# Patient Record
Sex: Female | Born: 1955
Health system: Southern US, Community
[De-identification: ages and names within clinical notes are randomized; demographics above are authoritative.]

## PROBLEM LIST (undated history)

## (undated) DIAGNOSIS — R000 Tachycardia, unspecified: Secondary | ICD-10-CM

## (undated) DIAGNOSIS — C480 Malignant neoplasm of retroperitoneum: Secondary | ICD-10-CM

## (undated) DIAGNOSIS — Z923 Personal history of irradiation: Secondary | ICD-10-CM

## (undated) DIAGNOSIS — Z9889 Other specified postprocedural states: Secondary | ICD-10-CM

## (undated) DIAGNOSIS — E785 Hyperlipidemia, unspecified: Secondary | ICD-10-CM

## (undated) DIAGNOSIS — C50919 Malignant neoplasm of unspecified site of unspecified female breast: Secondary | ICD-10-CM

## (undated) DIAGNOSIS — I10 Essential (primary) hypertension: Secondary | ICD-10-CM

## (undated) DIAGNOSIS — R112 Nausea with vomiting, unspecified: Secondary | ICD-10-CM

## (undated) DIAGNOSIS — T4145XA Adverse effect of unspecified anesthetic, initial encounter: Secondary | ICD-10-CM

## (undated) DIAGNOSIS — M109 Gout, unspecified: Secondary | ICD-10-CM

## (undated) DIAGNOSIS — T8859XA Other complications of anesthesia, initial encounter: Secondary | ICD-10-CM

## (undated) DIAGNOSIS — E119 Type 2 diabetes mellitus without complications: Secondary | ICD-10-CM

## (undated) HISTORY — DX: Malignant neoplasm of unspecified site of unspecified female breast: C50.919

## (undated) HISTORY — DX: Hyperlipidemia, unspecified: E78.5

## (undated) HISTORY — PX: TOE SURGERY: SHX1073

## (undated) HISTORY — DX: Type 2 diabetes mellitus without complications: E11.9

## (undated) HISTORY — DX: Essential (primary) hypertension: I10

## (undated) HISTORY — PX: BREAST EXCISIONAL BIOPSY: SUR124

## (undated) HISTORY — PX: BREAST SURGERY: SHX581

## (undated) HISTORY — PX: TONSILLECTOMY: SUR1361

## (undated) HISTORY — PX: CHOLECYSTECTOMY: SHX55

---
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
## 2008-06-29 HISTORY — PX: SHOULDER SURGERY: SHX246

## 2011-04-28 DIAGNOSIS — E7800 Pure hypercholesterolemia, unspecified: Secondary | ICD-10-CM | POA: Insufficient documentation

## 2011-12-08 DIAGNOSIS — M79674 Pain in right toe(s): Secondary | ICD-10-CM | POA: Insufficient documentation

## 2012-03-03 DIAGNOSIS — Z0000 Encounter for general adult medical examination without abnormal findings: Secondary | ICD-10-CM | POA: Insufficient documentation

## 2013-04-03 DIAGNOSIS — N750 Cyst of Bartholin's gland: Secondary | ICD-10-CM | POA: Insufficient documentation

## 2014-08-31 ENCOUNTER — Ambulatory Visit (INDEPENDENT_AMBULATORY_CARE_PROVIDER_SITE_OTHER): Payer: Medicare Other | Admitting: Physician Assistant

## 2014-08-31 ENCOUNTER — Encounter: Payer: Self-pay | Admitting: Physician Assistant

## 2014-08-31 VITALS — BP 99/57 | HR 81 | Ht 59.0 in | Wt 165.0 lb

## 2014-08-31 DIAGNOSIS — I10 Essential (primary) hypertension: Secondary | ICD-10-CM | POA: Diagnosis not present

## 2014-08-31 DIAGNOSIS — G4700 Insomnia, unspecified: Secondary | ICD-10-CM

## 2014-08-31 DIAGNOSIS — J069 Acute upper respiratory infection, unspecified: Secondary | ICD-10-CM

## 2014-08-31 MED ORDER — AZITHROMYCIN 250 MG PO TABS: ORAL_TABLET | ORAL | Status: DC

## 2014-08-31 MED ORDER — OLMESARTAN-AMLODIPINE-HCTZ 20-5-12.5 MG PO TABS: 1.0000 | ORAL_TABLET | Freq: Every day | ORAL | Status: DC

## 2014-08-31 MED ORDER — HYDROCODONE-HOMATROPINE 5-1.5 MG/5ML PO SYRP: 5.0000 mL | ORAL_SOLUTION | Freq: Every evening | ORAL | Status: DC | PRN

## 2014-08-31 NOTE — Progress Notes (Signed)
Subjective:    Patient ID: Bonnie Torres, female    DOB: [birth year], 59 y.o.   MRN: [id number]  HPI Patient is a 59 year old female who presents to the clinic to establish care.  .. Active Ambulatory Problems    Diagnosis Date Noted  . Insomnia 08/31/2014  . Essential hypertension, benign 08/31/2014   Resolved Ambulatory Problems    Diagnosis Date Noted  . No Resolved Ambulatory Problems   Past Medical History  Diagnosis Date  . Hypertension   . Hyperlipidemia    .[name] History   Social History  . Marital Status: Widowed    Spouse Name: N/A  . Number of Children: N/A  . Years of Education: N/A   Occupational History  . Not on file.   Social History Main Topics  . Smoking status: Never Smoker   . Smokeless tobacco: Not on file  . Alcohol Use: No  . Drug Use: No  . Sexual Activity: Not Currently   Other Topics Concern  . Not on file   Social History Narrative  . No narrative on file   .[name] Family History  Problem Relation Age of Onset  . Hypertension Mother   . Diabetes Maternal Aunt   . Cancer Paternal Aunt     stomach  . Heart attack Paternal Aunt    Patient just finished a study at [facility] that was closely monitoring her blood pressure. She is now out of the study. She denies any chest pain, palpitations, headaches or vision changes.  Patient has had a long struggle with insomnia. She has been on clonidine for many years. She does not necessarily feel like it works effectively. Patient has problems going and staying asleep. She reports about 4 hours of sleep on average a night.  Patient also has had 5 days of upper respiratory symptoms. She reports cough that is productive, headaches, sore throat and body aches. She denies any fever, chills, nausea or vomiting. She's been taking Robitussin over-the-counter with little relief. She does feel like her chest is a little tight and short of breath. She has no history of lung issues or diseases.   Review  of Systems  All other systems reviewed and are negative.      Objective:   Physical Exam  Constitutional: She is oriented to person, place, and time. She appears well-developed and well-nourished.  HENT:  Head: Normocephalic and atraumatic.  Right Ear: External ear normal.  Left Ear: External ear normal.  Nose: Nose normal.  Mouth/Throat: Oropharynx is clear and moist. No oropharyngeal exudate.  TM's clear bilaterally.  Nasal turbinates red and swollen bilaterally.   Eyes: Conjunctivae are normal. Right eye exhibits no discharge. Left eye exhibits no discharge.  Neck: Normal range of motion. Neck supple.  Cardiovascular: Normal rate, regular rhythm and normal heart sounds.   Pulmonary/Chest: Effort normal and breath sounds normal. She has no wheezes.  Hacking cough on exam today.   Lymphadenopathy:    She has no cervical adenopathy.  Neurological: She is alert and oriented to person, place, and time.  Skin: Skin is dry.  Psychiatric: She has a normal mood and affect. Her behavior is normal.          Assessment & Plan:  HTN- discuss with patient the blood pressure is low today. She is on a lot of blood pressure medication. She also is not a fan of the pill burden that includes. I do want to make some changes in her blood pressure medicine today.  We'll start with decreasing lisinopril and diruretic and combining pills with tribenzor once daily. Once starts tribenzor then can stop lisinopril, norvasc, spironactolone. Continue on atenolol. Will up date med list once we find out if can afford tribenzor. Go online for coupon.  Follow up in 4 weeks to recheck BP.   Insomnia- on clonidine. Patient has been on for a while. She does not feel like this necessarily works. Discussed other sleep options such as trazodone, ambien and belsomra. Declines any change in treatment today.   Acute upper respiratory infection- likley started as viral. Cough is productive I do think symptoms changing  and not improving. Hycodan at bedtime given for cough. zpak for infection. Follow up if not improving. Continue mucinex.   Needs CPE in 4 weeks and BP check.

## 2014-08-31 NOTE — Patient Instructions (Addendum)
Melatonin up to 10mg  about 1 hour before bed.   Upper Respiratory Infection, Adult An upper respiratory infection (URI) is also sometimes known as the common cold. The upper respiratory tract includes the nose, sinuses, throat, trachea, and bronchi. Bronchi are the airways leading to the lungs. Most people improve within 1 week, but symptoms can last up to 2 weeks. A residual cough may last even longer.  CAUSES Many different viruses can infect the tissues lining the upper respiratory tract. The tissues become irritated and inflamed and often become very moist. Mucus production is also common. A cold is contagious. You can easily spread the virus to others by oral contact. This includes kissing, sharing a glass, coughing, or sneezing. Touching your mouth or nose and then touching a surface, which is then touched by another person, can also spread the virus. SYMPTOMS  Symptoms typically develop 1 to 3 days after you come in contact with a cold virus. Symptoms vary from person to person. They may include:  Runny nose.  Sneezing.  Nasal congestion.  Sinus irritation.  Sore throat.  Loss of voice (laryngitis).  Cough.  Fatigue.  Muscle aches.  Loss of appetite.  Headache.  Low-grade fever. DIAGNOSIS  You might diagnose your own cold based on familiar symptoms, since most people get a cold 2 to 3 times a year. Your caregiver can confirm this based on your exam. Most importantly, your caregiver can check that your symptoms are not due to another disease such as strep throat, sinusitis, pneumonia, asthma, or epiglottitis. Blood tests, throat tests, and X-rays are not necessary to diagnose a common cold, but they may sometimes be helpful in excluding other more serious diseases. Your caregiver will decide if any further tests are required. RISKS AND COMPLICATIONS  You may be at risk for a more severe case of the common cold if you smoke cigarettes, have chronic heart disease (such as heart  failure) or lung disease (such as asthma), or if you have a weakened immune system. The very young and very old are also at risk for more serious infections. Bacterial sinusitis, middle ear infections, and bacterial pneumonia can complicate the common cold. The common cold can worsen asthma and chronic obstructive pulmonary disease (COPD). Sometimes, these complications can require emergency medical care and may be life-threatening. PREVENTION  The best way to protect against getting a cold is to practice good hygiene. Avoid oral or hand contact with people with cold symptoms. Wash your hands often if contact occurs. There is no clear evidence that vitamin C, vitamin E, echinacea, or exercise reduces the chance of developing a cold. However, it is always recommended to get plenty of rest and practice good nutrition. TREATMENT  Treatment is directed at relieving symptoms. There is no cure. Antibiotics are not effective, because the infection is caused by a virus, not by bacteria. Treatment may include:  Increased fluid intake. Sports drinks offer valuable electrolytes, sugars, and fluids.  Breathing heated mist or steam (vaporizer or shower).  Eating chicken soup or other clear broths, and maintaining good nutrition.  Getting plenty of rest.  Using gargles or lozenges for comfort.  Controlling fevers with ibuprofen or acetaminophen as directed by your caregiver.  Increasing usage of your inhaler if you have asthma. Zinc gel and zinc lozenges, taken in the first 24 hours of the common cold, can shorten the duration and lessen the severity of symptoms. Pain medicines may help with fever, muscle aches, and throat pain. A variety of non-prescription  medicines are available to treat congestion and runny nose. Your caregiver can make recommendations and may suggest nasal or lung inhalers for other symptoms.  HOME CARE INSTRUCTIONS   Only take over-the-counter or prescription medicines for pain,  discomfort, or fever as directed by your caregiver.  Use a warm mist humidifier or inhale steam from a shower to increase air moisture. This may keep secretions moist and make it easier to breathe.  Drink enough water and fluids to keep your urine clear or pale yellow.  Rest as needed.  Return to work when your temperature has returned to normal or as your caregiver advises. You may need to stay home longer to avoid infecting others. You can also use a face mask and careful hand washing to prevent spread of the virus. SEEK MEDICAL CARE IF:   After the first few days, you feel you are getting worse rather than better.  You need your caregiver's advice about medicines to control symptoms.  You develop chills, worsening shortness of breath, or brown or red sputum. These may be signs of pneumonia.  You develop yellow or brown nasal discharge or pain in the face, especially when you bend forward. These may be signs of sinusitis.  You develop a fever, swollen neck glands, pain with swallowing, or white areas in the back of your throat. These may be signs of strep throat. SEEK IMMEDIATE MEDICAL CARE IF:   You have a fever.  You develop severe or persistent headache, ear pain, sinus pain, or chest pain.  You develop wheezing, a prolonged cough, cough up blood, or have a change in your usual mucus (if you have chronic lung disease).  You develop sore muscles or a stiff neck. Document Released: 12/09/2000 Document Revised: 09/07/2011 Document Reviewed: 09/20/2013 [facility] Patient Information 2015 [place], [place]. This information is not intended to replace advice given to you by your health care provider. Make sure you discuss any questions you have with your health care provider.

## 2014-09-28 ENCOUNTER — Encounter: Payer: Self-pay | Admitting: Physician Assistant

## 2014-09-28 ENCOUNTER — Ambulatory Visit (INDEPENDENT_AMBULATORY_CARE_PROVIDER_SITE_OTHER): Payer: Medicare Other | Admitting: Physician Assistant

## 2014-09-28 VITALS — BP 132/76 | HR 89 | Ht 59.0 in | Wt 170.0 lb

## 2014-09-28 DIAGNOSIS — Z131 Encounter for screening for diabetes mellitus: Secondary | ICD-10-CM

## 2014-09-28 DIAGNOSIS — I10 Essential (primary) hypertension: Secondary | ICD-10-CM | POA: Diagnosis not present

## 2014-09-28 DIAGNOSIS — Z79899 Other long term (current) drug therapy: Secondary | ICD-10-CM

## 2014-09-28 DIAGNOSIS — E785 Hyperlipidemia, unspecified: Secondary | ICD-10-CM | POA: Diagnosis not present

## 2014-09-28 DIAGNOSIS — G4700 Insomnia, unspecified: Secondary | ICD-10-CM

## 2014-09-28 LAB — COMPLETE METABOLIC PANEL WITH GFR
ALT: 14 U/L (ref 0–35)
AST: 15 U/L (ref 0–37)
Albumin: 4.4 g/dL (ref 3.5–5.2)
Alkaline Phosphatase: 46 U/L (ref 39–117)
BUN: 10 mg/dL (ref 6–23)
CHLORIDE: 103 meq/L (ref 96–112)
CO2: 27 meq/L (ref 19–32)
CREATININE: 0.77 mg/dL (ref 0.50–1.10)
Calcium: 9.4 mg/dL (ref 8.4–10.5)
GFR, EST NON AFRICAN AMERICAN: 86 mL/min
GFR, Est African American: >89 mL/min
Glucose, Bld: 110 mg/dL — ABNORMAL HIGH (ref 70–99)
Potassium: 3.3 mEq/L — ABNORMAL LOW (ref 3.5–5.3)
Sodium: 139 mEq/L (ref 135–145)
Total Bilirubin: 0.6 mg/dL (ref 0.2–1.2)
Total Protein: 7.8 g/dL (ref 6.0–8.3)

## 2014-09-28 LAB — LIPID PANEL
Cholesterol: 161 mg/dL (ref 0–200)
HDL: 35 mg/dL — AB (ref >=46)
LDL Cholesterol: 108 mg/dL — ABNORMAL HIGH (ref 0–99)
Total CHOL/HDL Ratio: 4.6 Ratio
Triglycerides: 92 mg/dL (ref <150)
VLDL: 18 mg/dL (ref 0–40)

## 2014-09-28 MED ORDER — OLMESARTAN-AMLODIPINE-HCTZ 20-5-12.5 MG PO TABS: 1.0000 | ORAL_TABLET | Freq: Every day | ORAL | Status: DC

## 2014-09-28 MED ORDER — CLONIDINE HCL 0.2 MG PO TABS: 0.2000 mg | ORAL_TABLET | Freq: Every day | ORAL | Status: DC

## 2014-09-28 MED ORDER — ATENOLOL 100 MG PO TABS: 100.0000 mg | ORAL_TABLET | Freq: Every day | ORAL | Status: DC

## 2014-09-28 NOTE — Progress Notes (Signed)
   Subjective:    Patient ID: Bonnie Torres, female    DOB: 04/08/1956, 59 y.o.   MRN: [id number]  HPI Pt presents to the clinic with BP follow up on tribenzor and BP medication changes. Denies any CP, headaches or problems. She does feel like she has more energy and feels overall better. She has noticed that she has more palpitations and feels like heart is racing. She admits she has not been taking atenolol as well.   Insomnia- well controlled. Needs clonadine refilled.   Hyperlipidemia- needs refill. Not checked in a while.    Review of Systems  All other systems reviewed and are negative.      Objective:   Physical Exam  Constitutional: She is oriented to person, place, and time. She appears well-developed and well-nourished.  HENT:  Head: Normocephalic and atraumatic.  Cardiovascular: Normal rate, regular rhythm and normal heart sounds.   Pulmonary/Chest: Effort normal and breath sounds normal. She has no wheezes.  Neurological: She is alert and oriented to person, place, and time.  Skin: Skin is dry.  Psychiatric: She has a normal mood and affect. Her behavior is normal.          Assessment & Plan:  HTN- pt was supposed to start tribenzor and atenolol. She stopped everything except tribenzor. I would like her to add 1/2 100mg  tablet of atenolol. This could be the reason palpitations have increased. BP looks fine. Will get cmp today. Follow up in 6 months, tribenzor and atenolol refilled.   Insomnia- refilled clonadine for 6 months.   Hyperlipidemia- ordered lipid today will adjust medications accordingly.   Has CPE on 15th.

## 2014-10-01 ENCOUNTER — Other Ambulatory Visit: Payer: Self-pay | Admitting: *Deleted

## 2014-10-01 MED ORDER — PRAVASTATIN SODIUM 40 MG PO TABS: 40.0000 mg | ORAL_TABLET | Freq: Every day | ORAL | Status: DC

## 2014-10-12 ENCOUNTER — Other Ambulatory Visit (HOSPITAL_COMMUNITY)
Admission: RE | Admit: 2014-10-12 | Discharge: 2014-10-12 | Disposition: A | Discharge status: Home / Self Care | Payer: Medicare Other | Source: Ambulatory Visit | Attending: Physician Assistant | Admitting: Physician Assistant

## 2014-10-12 ENCOUNTER — Ambulatory Visit (INDEPENDENT_AMBULATORY_CARE_PROVIDER_SITE_OTHER): Payer: Medicare Other | Admitting: Physician Assistant

## 2014-10-12 ENCOUNTER — Encounter: Payer: Self-pay | Admitting: Physician Assistant

## 2014-10-12 VITALS — BP 129/79 | HR 81 | Wt 168.0 lb

## 2014-10-12 DIAGNOSIS — Z23 Encounter for immunization: Secondary | ICD-10-CM | POA: Diagnosis not present

## 2014-10-12 DIAGNOSIS — Z124 Encounter for screening for malignant neoplasm of cervix: Secondary | ICD-10-CM | POA: Diagnosis not present

## 2014-10-12 DIAGNOSIS — Z0000 Encounter for general adult medical examination without abnormal findings: Secondary | ICD-10-CM

## 2014-10-12 DIAGNOSIS — Z1151 Encounter for screening for human papillomavirus (HPV): Secondary | ICD-10-CM | POA: Diagnosis not present

## 2014-10-12 DIAGNOSIS — R7301 Impaired fasting glucose: Secondary | ICD-10-CM

## 2014-10-12 DIAGNOSIS — E876 Hypokalemia: Secondary | ICD-10-CM | POA: Diagnosis not present

## 2014-10-12 DIAGNOSIS — Z1211 Encounter for screening for malignant neoplasm of colon: Secondary | ICD-10-CM

## 2014-10-12 DIAGNOSIS — Z1231 Encounter for screening mammogram for malignant neoplasm of breast: Secondary | ICD-10-CM

## 2014-10-12 LAB — POCT GLYCOSYLATED HEMOGLOBIN (HGB A1C)

## 2014-10-12 NOTE — Patient Instructions (Addendum)
Mammogram and colonoscopy ordered.   Suggested eye exam.  Keeping You Healthy  Get These Tests  Blood Pressure- Have your blood pressure checked by your healthcare provider at least once a year.  Normal blood pressure is 120/80.  Weight- Have your body mass index (BMI) calculated to screen for obesity.  BMI is a measure of body fat based on height and weight.  You can calculate your own BMI at [employer].it  Cholesterol- Have your cholesterol checked every year.  Diabetes- Have your blood sugar checked every year if you have high blood pressure, high cholesterol, a family history of diabetes or if you are overweight.  Pap Smear- Have a pap smear every 1 to 3 years if you have been sexually active.  If you are older than 65 and recent pap smears have been normal you may not need additional pap smears.  In addition, if you have had a hysterectomy  For benign disease additional pap smears are not necessary.  Mammogram-Yearly mammograms are essential for early detection of breast cancer  Screening for Colon Cancer- Colonoscopy starting at age 53. Screening may begin sooner depending on your family history and other health conditions.  Follow up colonoscopy as directed by your Gastroenterologist.  Screening for Osteoporosis- Screening begins at age 7 with bone density scanning, sooner if you are at higher risk for developing Osteoporosis.  Get these medicines  Calcium with Vitamin D- Your body requires 1200-1500 mg of Calcium a day and [phone] IU of Vitamin D a day.  You can only absorb 500 mg of Calcium at a time therefore Calcium must be taken in 2 or 3 separate doses throughout the day.  Hormones- Hormone therapy has been associated with increased risk for certain cancers and heart disease.  Talk to your healthcare provider about if you need relief from menopausal symptoms.  Aspirin- Ask your healthcare provider about taking Aspirin to prevent Heart Disease and Stroke.  Get  these Immuniztions  Flu shot- Every fall  Pneumonia shot- Once after the age of 37; if you are younger ask your healthcare provider if you need a pneumonia shot.  Tetanus- Every ten years.  Zostavax- Once after the age of 60 to prevent shingles.  Take these steps  Don't smoke- Your healthcare provider can help you quit. For tips on how to quit, ask your healthcare provider or go to www.smokefree.gov or call 1-800 QUIT-NOW.  Be physically active- Exercise 5 days a week for a minimum of 30 minutes.  If you are not already physically active, start slow and gradually work up to 30 minutes of moderate physical activity.  Try walking, dancing, bike riding, swimming, etc.  Eat a healthy diet- Eat a variety of healthy foods such as fruits, vegetables, whole grains, low fat milk, low fat cheeses, yogurt, lean meats, chicken, fish, eggs, dried beans, tofu, etc.  For more information go to www.thenutritionsource.org  Dental visit- Brush and floss teeth twice daily; visit your dentist twice a year.  Eye exam- Visit your Optometrist or Ophthalmologist yearly.  Drink alcohol in moderation- Limit alcohol intake to one drink or less a day.  Never drink and drive.  Depression- Your emotional health is as important as your physical health.  If you're feeling down or losing interest in things you normally enjoy, please talk to your healthcare provider.  Seat Belts- can save your life; always wear one  Smoke/Carbon Monoxide detectors- These detectors need to be installed on the appropriate level of your home.  Replace  batteries at least once a year.  Violence- If anyone is threatening or hurting you, please tell your healthcare provider.  Living Will/ Health care power of attorney- Discuss with your healthcare provider and family.

## 2014-10-13 LAB — BASIC METABOLIC PANEL
BUN: 13 mg/dL (ref 6–23)
CALCIUM: 9.4 mg/dL (ref 8.4–10.5)
CO2: 26 mEq/L (ref 19–32)
Chloride: 102 mEq/L (ref 96–112)
Creat: 0.79 mg/dL (ref 0.50–1.10)
GLUCOSE: 99 mg/dL (ref 70–99)
Potassium: 3.7 mEq/L (ref 3.5–5.3)
Sodium: 138 mEq/L (ref 135–145)

## 2014-10-13 LAB — HIV ANTIBODY (ROUTINE TESTING W REFLEX): HIV 1&2 Ab, 4th Generation: NONREACTIVE

## 2014-10-13 NOTE — Progress Notes (Signed)
Subjective:    Bonnie Torres is a 59 y.o. female who presents for Medicare Annual/Subsequent preventive examination.  Preventive Screening-Counseling & Management  Tobacco History  Smoking status  . Never Smoker   Smokeless tobacco  . Not on file     Problems Prior to Visit 1.   Current Problems (verified) Patient Active Problem List   Diagnosis Date Noted  . Hyperlipidemia 09/28/2014  . Insomnia 08/31/2014  . Essential hypertension, benign 08/31/2014    Medications Prior to Visit Current Outpatient Prescriptions on File Prior to Visit  Medication Sig Dispense Refill  . atenolol (TENORMIN) 100 MG tablet Take 1 tablet (100 mg total) by mouth daily. (Patient taking differently: Take 100 mg by mouth daily. Pt takes 1/2 tablet daily.) 90 tablet 0  . cloNIDine (CATAPRES) 0.2 MG tablet Take 1 tablet (0.2 mg total) by mouth at bedtime. 90 tablet 1  . Olmesartan-Amlodipine-HCTZ 20-5-12.5 MG TABS Take 1 tablet by mouth daily. 90 tablet 1  . pravastatin (PRAVACHOL) 40 MG tablet Take 1 tablet (40 mg total) by mouth daily. 90 tablet 3   No current facility-administered medications on file prior to visit.    Current Medications (verified) Current Outpatient Prescriptions  Medication Sig Dispense Refill  . atenolol (TENORMIN) 100 MG tablet Take 1 tablet (100 mg total) by mouth daily. (Patient taking differently: Take 100 mg by mouth daily. Pt takes 1/2 tablet daily.) 90 tablet 0  . cloNIDine (CATAPRES) 0.2 MG tablet Take 1 tablet (0.2 mg total) by mouth at bedtime. 90 tablet 1  . Olmesartan-Amlodipine-HCTZ 20-5-12.5 MG TABS Take 1 tablet by mouth daily. 90 tablet 1  . pravastatin (PRAVACHOL) 40 MG tablet Take 1 tablet (40 mg total) by mouth daily. 90 tablet 3   No current facility-administered medications for this visit.     Allergies (verified) Review of patient's allergies indicates no known allergies.   PAST HISTORY  Family History Family History  Problem Relation Age of  Onset  . Hypertension Mother   . Diabetes Maternal Aunt   . Cancer Paternal Aunt     stomach  . Heart attack Paternal Aunt     Social History History  Substance Use Topics  . Smoking status: Never Smoker   . Smokeless tobacco: Not on file  . Alcohol Use: No     Are there smokers in your home (other than you)? No  Risk Factors Current exercise habits: The patient does not participate in regular exercise at present.  Dietary issues discussed: none   Cardiac risk factors: sedentary life.  Depression Screen (Note: if answer to either of the following is "Yes", a more complete depression screening is indicated)   Over the past two weeks, have you felt down, depressed or hopeless? No  Over the past two weeks, have you felt little interest or pleasure in doing things? No  Have you lost interest or pleasure in daily life? No  Do you often feel hopeless? No  Do you cry easily over simple problems? No  Activities of Daily Living In your present state of health, do you have any difficulty performing the following activities?:  Driving? No Managing money?  No Feeding yourself? No Getting from bed to chair? No{ Climbing a flight of stairs? No Preparing food and eating?: No Bathing or showering? No Getting dressed: No Getting to the toilet? No Using the toilet:No Moving around from place to place: No In the past year have you fallen or had a near fall?:No   Are you  sexually active?  No  Do you have more than one partner?  N/A  Hearing Difficulties: No Do you often ask people to speak up or repeat themselves? No Do you experience ringing or noises in your ears? No Do you have difficulty understanding soft or whispered voices? No   Do you feel that you have a problem with memory? No  Do you often misplace items? No  Do you feel safe at home?  Yes  Cognitive Testing  Alert? Yes  Normal Appearance?Yes  Oriented to person? Yes  Place? Yes   Time? Yes  Recall of three  objects?  Yes  Can perform simple calculations? Yes  Displays appropriate judgment?Yes  Can read the correct time from a watch face?Yes   Advanced Directives have been discussed with the patient? Yes  List the Names of Other Physician/Practitioners you currently use: 1.    Indicate any recent Medical Services you may have received from other than [facility] providers in the past year (date may be approximate).  Immunization History  Administered Date(s) Administered  . Tdap 10/12/2014    Screening Tests Health Maintenance  Topic Date Due  . MAMMOGRAM  08/04/2007  . COLONOSCOPY  08/04/2007  . INFLUENZA VACCINE  01/28/2015  . PAP SMEAR  10/11/2017  . TETANUS/TDAP  10/11/2024  . HIV Screening  Completed    All answers were reviewed with the patient and necessary referrals were made:  [facility], Bonnie Wiedeman, PA-C   10/13/2014   History reviewed: allergies, current medications, past family history, past medical history, past social history, past surgical history and problem list  Review of Systems A comprehensive review of systems was negative.    Objective:     Vision by Snellen chart: right eye:20/40, left eye:20/25  Body mass index is 33.91 kg/(m^2). BP 129/79 mmHg  Pulse 81  Wt 168 lb (76.204 kg)  SpO2 97%  BP 129/79 mmHg  Pulse 81  Wt 168 lb (76.204 kg)  SpO2 97%  General Appearance:    Alert, cooperative, no distress, appears stated age  Head:    Normocephalic, without obvious abnormality, atraumatic  Eyes:    PERRL, conjunctiva/corneas clear, EOM's intact, fundi    benign, both eyes  Ears:    Normal TM's and external ear canals, both ears  Nose:   Nares normal, septum midline, mucosa normal, no drainage    or sinus tenderness  Throat:   Lips, mucosa, and tongue normal; teeth and gums normal  Neck:   Supple, symmetrical, trachea midline, no adenopathy;    thyroid:  no enlargement/tenderness/nodules; no carotid   bruit or JVD  Back:     Symmetric, no curvature, ROM  normal, no CVA tenderness  Lungs:     Clear to auscultation bilaterally, respirations unlabored  Chest Wall:    No tenderness or deformity   Heart:    Regular rate and rhythm, S1 and S2 normal, no murmur, rub   or gallop  Breast Exam:    Not done. Ordered mammogram.   Abdomen:     Soft, non-tender, bowel sounds active all four quadrants,    no masses, no organomegaly  Genitalia:    Normal female without lesion, discharge or tenderness  Rectal:   not done.   Extremities:   Extremities normal, atraumatic, no cyanosis or edema  Pulses:   2+ and symmetric all extremities  Skin:   Skin color, texture, turgor normal, no rashes or lesions  Lymph nodes:   Cervical, supraclavicular, and axillary nodes normal  Neurologic:   CNII-XII intact, normal strength, sensation and reflexes    throughout       Assessment:          Plan:     During the course of the visit the patient was educated and counseled about appropriate screening and preventive services including:    Td vaccine  Screening mammography  Screening Pap smear and pelvic exam   Colorectal cancer screening  Tdap given today.  Mammogram and colonoscopy ordered.  Pap smear done today.    Discussed diet and regular exercise. Encouraged calcium 1200mg  and vitamin d 800 units daily.  HTN- refilled medications for 6 months.   Fasting labs ordered.    Patient Instructions (the written plan) was given to the patient.  Medicare Attestation I have personally reviewed: The patient's medical and social history Their use of alcohol, tobacco or illicit drugs Their current medications and supplements The patient's functional ability including ADLs,fall risks, home safety risks, cognitive, and hearing and visual impairment Diet and physical activities Evidence for depression or mood disorders  The patient's weight, height, BMI, and visual acuity have been recorded in the chart.  I have made referrals, counseling, and provided  education to the patient based on review of the above and I have provided the patient with a written personalized care plan for preventive services.     Iran Planas, PA-C   10/12/14

## 2014-10-15 LAB — CYTOLOGY - PAP

## 2014-11-16 ENCOUNTER — Encounter: Payer: Self-pay | Admitting: Internal Medicine

## 2014-11-22 ENCOUNTER — Ambulatory Visit: Payer: Medicare Other

## 2014-12-06 ENCOUNTER — Ambulatory Visit: Payer: Medicare Other

## 2015-03-06 DIAGNOSIS — H524 Presbyopia: Secondary | ICD-10-CM | POA: Diagnosis not present

## 2015-03-28 ENCOUNTER — Other Ambulatory Visit: Payer: Self-pay | Admitting: Physician Assistant

## 2015-04-01 ENCOUNTER — Other Ambulatory Visit: Payer: Self-pay | Admitting: Physician Assistant

## 2015-04-15 ENCOUNTER — Ambulatory Visit (INDEPENDENT_AMBULATORY_CARE_PROVIDER_SITE_OTHER): Payer: Medicare Other | Admitting: Physician Assistant

## 2015-04-15 VITALS — BP 128/66 | HR 64 | Temp 97.9°F | Wt 167.0 lb

## 2015-04-15 DIAGNOSIS — I10 Essential (primary) hypertension: Secondary | ICD-10-CM | POA: Diagnosis not present

## 2015-04-15 DIAGNOSIS — Z23 Encounter for immunization: Secondary | ICD-10-CM

## 2015-04-15 DIAGNOSIS — M545 Low back pain, unspecified: Secondary | ICD-10-CM

## 2015-04-15 MED ORDER — CYCLOBENZAPRINE HCL 10 MG PO TABS: 10.0000 mg | ORAL_TABLET | Freq: Three times a day (TID) | ORAL | Status: DC | PRN

## 2015-04-15 MED ORDER — MELOXICAM 15 MG PO TABS: 15.0000 mg | ORAL_TABLET | Freq: Every day | ORAL | Status: DC

## 2015-04-15 MED ORDER — NYSTATIN-TRIAMCINOLONE 100000-0.1 UNIT/GM-% EX OINT: 1.0000 "application " | TOPICAL_OINTMENT | Freq: Two times a day (BID) | CUTANEOUS | Status: DC

## 2015-04-15 NOTE — Patient Instructions (Signed)
Low Back Sprain With Rehab A sprain is an injury in which a ligament is torn. The ligaments of the lower back are vulnerable to sprains. However, they are strong and require great force to be injured. These ligaments are important for stabilizing the spinal column. Sprains are classified into three categories. Grade 1 sprains cause pain, but the tendon is not lengthened. Grade 2 sprains include a lengthened ligament, due to the ligament being stretched or partially ruptured. With grade 2 sprains there is still function, although the function may be decreased. Grade 3 sprains involve a complete tear of the tendon or muscle, and function is usually impaired. SYMPTOMS   Severe pain in the lower back.  Sometimes, a feeling of a "pop," "snap," or tear, at the time of injury.  Tenderness and sometimes swelling at the injury site.  Uncommonly, bruising (contusion) within 48 hours of injury.  Muscle spasms in the back. CAUSES  Low back sprains occur when a force is placed on the ligaments that is greater than they can handle. Common causes of injury include:  Performing a stressful act while off-balance.  Repetitive stressful activities that involve movement of the lower back.  Direct hit (trauma) to the lower back. RISK INCREASES WITH:  Contact sports (football, wrestling).  Collisions (major skiing accidents).  Sports that require throwing or lifting (baseball, weightlifting).  Sports involving twisting of the spine (gymnastics, diving, tennis, golf).  Poor strength and flexibility.  Inadequate protection.  Previous back injury or surgery (especially fusion). PREVENTION  Wear properly fitted and padded protective equipment.  Warm up and stretch properly before activity.  Allow for adequate recovery between workouts.  Maintain physical fitness:  Strength, flexibility, and endurance.  Cardiovascular fitness.  Maintain a healthy body weight. PROGNOSIS  If treated properly,  low back sprains usually heal with non-surgical treatment. The length of time for healing depends on the severity of the injury.  RELATED COMPLICATIONS   Recurring symptoms, resulting in a chronic problem.  Chronic inflammation and pain in the low back.  Delayed healing or resolution of symptoms, especially if activity is resumed too soon.  Prolonged impairment.  Unstable or arthritic joints of the low back. TREATMENT  Treatment first involves the use of ice and medicine, to reduce pain and inflammation. The use of strengthening and stretching exercises may help reduce pain with activity. These exercises may be performed at home or with a therapist. Severe injuries may require referral to a therapist for further evaluation and treatment, such as ultrasound. Your caregiver may advise that you wear a back brace or corset, to help reduce pain and discomfort. Often, prolonged bed rest results in greater harm then benefit. Corticosteroid injections may be recommended. However, these should be reserved for the most serious cases. It is important to avoid using your back when lifting objects. At night, sleep on your back on a firm mattress, with a pillow placed under your knees. If non-surgical treatment is unsuccessful, surgery may be needed.  MEDICATION   If pain medicine is needed, nonsteroidal anti-inflammatory medicines (aspirin and ibuprofen), or other minor pain relievers (acetaminophen), are often advised.  Do not take pain medicine for 7 days before surgery.  Prescription pain relievers may be given, if your caregiver thinks they are needed. Use only as directed and only as much as you need.  Ointments applied to the skin may be helpful.  Corticosteroid injections may be given by your caregiver. These injections should be reserved for the most serious cases, because   they may only be given a certain number of times. HEAT AND COLD  Cold treatment (icing) should be applied for 10 to 15  minutes every 2 to 3 hours for inflammation and pain, and immediately after activity that aggravates your symptoms. Use ice packs or an ice massage.  Heat treatment may be used before performing stretching and strengthening activities prescribed by your caregiver, physical therapist, or athletic trainer. Use a heat pack or a warm water soak. SEEK MEDICAL CARE IF:   Symptoms get worse or do not improve in 2 to 4 weeks, despite treatment.  You develop numbness or weakness in either leg.  You lose bowel or bladder function.  Any of the following occur after surgery: fever, increased pain, swelling, redness, drainage of fluids, or bleeding in the affected area.  New, unexplained symptoms develop. (Drugs used in treatment may produce side effects.) EXERCISES  RANGE OF MOTION (ROM) AND STRETCHING EXERCISES - Low Back Sprain Most people with lower back pain will find that their symptoms get worse with excessive bending forward (flexion) or arching at the lower back (extension). The exercises that will help resolve your symptoms will focus on the opposite motion.  Your physician, physical therapist or athletic trainer will help you determine which exercises will be most helpful to resolve your lower back pain. Do not complete any exercises without first consulting with your caregiver. Discontinue any exercises which make your symptoms worse, until you speak to your caregiver. If you have pain, numbness or tingling which travels down into your buttocks, leg or foot, the goal of the therapy is for these symptoms to move closer to your back and eventually resolve. Sometimes, these leg symptoms will get better, but your lower back pain may worsen. This is often an indication of progress in your rehabilitation. Be very alert to any changes in your symptoms and the activities in which you participated in the 24 hours prior to the change. Sharing this information with your caregiver will allow him or her to most  efficiently treat your condition. These exercises may help you when beginning to rehabilitate your injury. Your symptoms may resolve with or without further involvement from your physician, physical therapist or athletic trainer. While completing these exercises, remember:   Restoring tissue flexibility helps normal motion to return to the joints. This allows healthier, less painful movement and activity.  An effective stretch should be held for at least 30 seconds.  A stretch should never be painful. You should only feel a gentle lengthening or release in the stretched tissue. FLEXION RANGE OF MOTION AND STRETCHING EXERCISES: STRETCH - Flexion, Single Knee to Chest   Lie on a firm bed or floor with both legs extended in front of you.  Keeping one leg in contact with the floor, bring your opposite knee to your chest. Hold your leg in place by either grabbing behind your thigh or at your knee.  Pull until you feel a gentle stretch in your low back. Hold __________ seconds.  Slowly release your grasp and repeat the exercise with the opposite side. Repeat __________ times. Complete this exercise __________ times per day.  STRETCH - Flexion, Double Knee to Chest  Lie on a firm bed or floor with both legs extended in front of you.  Keeping one leg in contact with the floor, bring your opposite knee to your chest.  Tense your stomach muscles to support your back and then lift your other knee to your chest. Hold your legs in   place by either grabbing behind your thighs or at your knees.  Pull both knees toward your chest until you feel a gentle stretch in your low back. Hold __________ seconds.  Tense your stomach muscles and slowly return one leg at a time to the floor. Repeat __________ times. Complete this exercise __________ times per day.  STRETCH - Low Trunk Rotation  Lie on a firm bed or floor. Keeping your legs in front of you, bend your knees so they are both pointed toward the  ceiling and your feet are flat on the floor.  Extend your arms out to the side. This will stabilize your upper body by keeping your shoulders in contact with the floor.  Gently and slowly drop both knees together to one side until you feel a gentle stretch in your low back. Hold for __________ seconds.  Tense your stomach muscles to support your lower back as you bring your knees back to the starting position. Repeat the exercise to the other side. Repeat __________ times. Complete this exercise __________ times per day  EXTENSION RANGE OF MOTION AND FLEXIBILITY EXERCISES: STRETCH - Extension, Prone on Elbows   Lie on your stomach on the floor, a bed will be too soft. Place your palms about shoulder width apart and at the height of your head.  Place your elbows under your shoulders. If this is too painful, stack pillows under your chest.  Allow your body to relax so that your hips drop lower and make contact more completely with the floor.  Hold this position for __________ seconds.  Slowly return to lying flat on the floor. Repeat __________ times. Complete this exercise __________ times per day.  RANGE OF MOTION - Extension, Prone Press Ups  Lie on your stomach on the floor, a bed will be too soft. Place your palms about shoulder width apart and at the height of your head.  Keeping your back as relaxed as possible, slowly straighten your elbows while keeping your hips on the floor. You may adjust the placement of your hands to maximize your comfort. As you gain motion, your hands will come more underneath your shoulders.  Hold this position __________ seconds.  Slowly return to lying flat on the floor. Repeat __________ times. Complete this exercise __________ times per day.  RANGE OF MOTION- Quadruped, Neutral Spine   Assume a hands and knees position on a firm surface. Keep your hands under your shoulders and your knees under your hips. You may place padding under your knees for  comfort.  Drop your head and point your tailbone toward the ground below you. This will round out your lower back like an angry cat. Hold this position for __________ seconds.  Slowly lift your head and release your tail bone so that your back sags into a large arch, like an old horse.  Hold this position for __________ seconds.  Repeat this until you feel limber in your low back.  Now, find your "sweet spot." This will be the most comfortable position somewhere between the two previous positions. This is your neutral spine. Once you have found this position, tense your stomach muscles to support your low back.  Hold this position for __________ seconds. Repeat __________ times. Complete this exercise __________ times per day.  STRENGTHENING EXERCISES - Low Back Sprain These exercises may help you when beginning to rehabilitate your injury. These exercises should be done near your "sweet spot." This is the neutral, low-back arch, somewhere between fully rounded and   fully arched, that is your least painful position. When performed in this safe range of motion, these exercises can be used for people who have either a flexion or extension based injury. These exercises may resolve your symptoms with or without further involvement from your physician, physical therapist or athletic trainer. While completing these exercises, remember:   Muscles can gain both the endurance and the strength needed for everyday activities through controlled exercises.  Complete these exercises as instructed by your physician, physical therapist or athletic trainer. Increase the resistance and repetitions only as guided.  You may experience muscle soreness or fatigue, but the pain or discomfort you are trying to eliminate should never worsen during these exercises. If this pain does worsen, stop and make certain you are following the directions exactly. If the pain is still present after adjustments, discontinue the  exercise until you can discuss the trouble with your caregiver. STRENGTHENING - Deep Abdominals, Pelvic Tilt   Lie on a firm bed or floor. Keeping your legs in front of you, bend your knees so they are both pointed toward the ceiling and your feet are flat on the floor.  Tense your lower abdominal muscles to press your low back into the floor. This motion will rotate your pelvis so that your tail bone is scooping upwards rather than pointing at your feet or into the floor. With a gentle tension and even breathing, hold this position for __________ seconds. Repeat __________ times. Complete this exercise __________ times per day.  STRENGTHENING - Abdominals, Crunches   Lie on a firm bed or floor. Keeping your legs in front of you, bend your knees so they are both pointed toward the ceiling and your feet are flat on the floor. Cross your arms over your chest.  Slightly tip your chin down without bending your neck.  Tense your abdominals and slowly lift your trunk high enough to just clear your shoulder blades. Lifting higher can put excessive stress on the lower back and does not further strengthen your abdominal muscles.  Control your return to the starting position. Repeat __________ times. Complete this exercise __________ times per day.  STRENGTHENING - Quadruped, Opposite UE/LE Lift   Assume a hands and knees position on a firm surface. Keep your hands under your shoulders and your knees under your hips. You may place padding under your knees for comfort.  Find your neutral spine and gently tense your abdominal muscles so that you can maintain this position. Your shoulders and hips should form a rectangle that is parallel with the floor and is not twisted.  Keeping your trunk steady, lift your right hand no higher than your shoulder and then your left leg no higher than your hip. Make sure you are not holding your breath. Hold this position for __________ seconds.  Continuing to keep  your abdominal muscles tense and your back steady, slowly return to your starting position. Repeat with the opposite arm and leg. Repeat __________ times. Complete this exercise __________ times per day.  STRENGTHENING - Abdominals and Quadriceps, Straight Leg Raise   Lie on a firm bed or floor with both legs extended in front of you.  Keeping one leg in contact with the floor, bend the other knee so that your foot can rest flat on the floor.  Find your neutral spine, and tense your abdominal muscles to maintain your spinal position throughout the exercise.  Slowly lift your straight leg off the floor about 6 inches for a count of   15, making sure to not hold your breath.  Still keeping your neutral spine, slowly lower your leg all the way to the floor. Repeat this exercise with each leg __________ times. Complete this exercise __________ times per day. POSTURE AND BODY MECHANICS CONSIDERATIONS - Low Back Sprain Keeping correct posture when sitting, standing or completing your activities will reduce the stress put on different body tissues, allowing injured tissues a chance to heal and limiting painful experiences. The following are general guidelines for improved posture. Your physician or physical therapist will provide you with any instructions specific to your needs. While reading these guidelines, remember:  The exercises prescribed by your provider will help you have the flexibility and strength to maintain correct postures.  The correct posture provides the best environment for your joints to work. All of your joints have less wear and tear when properly supported by a spine with good posture. This means you will experience a healthier, less painful body.  Correct posture must be practiced with all of your activities, especially prolonged sitting and standing. Correct posture is as important when doing repetitive low-stress activities (typing) as it is when doing a single heavy-load  activity (lifting). RESTING POSITIONS Consider which positions are most painful for you when choosing a resting position. If you have pain with flexion-based activities (sitting, bending, stooping, squatting), choose a position that allows you to rest in a less flexed posture. You would want to avoid curling into a fetal position on your side. If your pain worsens with extension-based activities (prolonged standing, working overhead), avoid resting in an extended position such as sleeping on your stomach. Most people will find more comfort when they rest with their spine in a more neutral position, neither too rounded nor too arched. Lying on a non-sagging bed on your side with a pillow between your knees, or on your back with a pillow under your knees will often provide some relief. Keep in mind, being in any one position for a prolonged period of time, no matter how correct your posture, can still lead to stiffness. PROPER SITTING POSTURE In order to minimize stress and discomfort on your spine, you must sit with correct posture. Sitting with good posture should be effortless for a healthy body. Returning to good posture is a gradual process. Many people can work toward this most comfortably by using various supports until they have the flexibility and strength to maintain this posture on their own. When sitting with proper posture, your ears will fall over your shoulders and your shoulders will fall over your hips. You should use the back of the chair to support your upper back. Your lower back will be in a neutral position, just slightly arched. You may place a small pillow or folded towel at the base of your lower back for  support.  When working at a desk, create an environment that supports good, upright posture. Without extra support, muscles tire, which leads to excessive strain on joints and other tissues. Keep these recommendations in mind: CHAIR:  A chair should be able to slide under your desk  when your back makes contact with the back of the chair. This allows you to work closely.  The chair's height should allow your eyes to be level with the upper part of your monitor and your hands to be slightly lower than your elbows. BODY POSITION  Your feet should make contact with the floor. If this is not possible, use a foot rest.  Keep your ears   over your shoulders. This will reduce stress on your neck and low back. INCORRECT SITTING POSTURES  If you are feeling tired and unable to assume a healthy sitting posture, do not slouch or slump. This puts excessive strain on your back tissues, causing more damage and pain. Healthier options include:  Using more support, like a lumbar pillow.  Switching tasks to something that requires you to be upright or walking.  Talking a brief walk.  Lying down to rest in a neutral-spine position. PROLONGED STANDING WHILE SLIGHTLY LEANING FORWARD  When completing a task that requires you to lean forward while standing in one place for a long time, place either foot up on a stationary 2-4 inch high object to help maintain the best posture. When both feet are on the ground, the lower back tends to lose its slight inward curve. If this curve flattens (or becomes too large), then the back and your other joints will experience too much stress, tire more quickly, and can cause pain. CORRECT STANDING POSTURES Proper standing posture should be assumed with all daily activities, even if they only take a few moments, like when brushing your teeth. As in sitting, your ears should fall over your shoulders and your shoulders should fall over your hips. You should keep a slight tension in your abdominal muscles to brace your spine. Your tailbone should point down to the ground, not behind your body, resulting in an over-extended swayback posture.  INCORRECT STANDING POSTURES  Common incorrect standing postures include a forward head, locked knees and/or an excessive  swayback. WALKING Walk with an upright posture. Your ears, shoulders and hips should all line-up. PROLONGED ACTIVITY IN A FLEXED POSITION When completing a task that requires you to bend forward at your waist or lean over a low surface, try to find a way to stabilize 3 out of 4 of your limbs. You can place a hand or elbow on your thigh or rest a knee on the surface you are reaching across. This will provide you more stability, so that your muscles do not tire as quickly. By keeping your knees relaxed, or slightly bent, you will also reduce stress across your lower back. CORRECT LIFTING TECHNIQUES DO :  Assume a wide stance. This will provide you more stability and the opportunity to get as close as possible to the object which you are lifting.  Tense your abdominals to brace your spine. Bend at the knees and hips. Keeping your back locked in a neutral-spine position, lift using your leg muscles. Lift with your legs, keeping your back straight.  Test the weight of unknown objects before attempting to lift them.  Try to keep your elbows locked down at your sides in order get the best strength from your shoulders when carrying an object.  Always ask for help when lifting heavy or awkward objects. INCORRECT LIFTING TECHNIQUES DO NOT:   Lock your knees when lifting, even if it is a small object.  Bend and twist. Pivot at your feet or move your feet when needing to change directions.  Assume that you can safely pick up even a paperclip without proper posture.   This information is not intended to replace advice given to you by your health care provider. Make sure you discuss any questions you have with your health care provider.   Document Released: 06/15/2005 Document Revised: 07/06/2014 Document Reviewed: 09/27/2008 [employer] Interactive Patient Education 2016 [employer]  

## 2015-04-17 ENCOUNTER — Encounter: Payer: Self-pay | Admitting: Physician Assistant

## 2015-04-17 DIAGNOSIS — M545 Low back pain, unspecified: Secondary | ICD-10-CM | POA: Insufficient documentation

## 2015-04-17 NOTE — Progress Notes (Signed)
   Subjective:    Patient ID: Bonnie Torres, female    DOB: [birth year], 57 y.o.   MRN: [id number]  HPI Patient is a 59 year old female who presents to the clinic to follow-up on hypertension. She needs medication refills today. She has no concerns or complaints. She denies any chest pains, palpitations, shortness of breath.  She does have some off and on again low back pain without radiation. She denies any saddle anesthesia or bowel dysfunction. She has been doing a lot of physical activity and noticed it felt like it went out. Usually this improves in 2-3 days of anti-inflammatories. She feels like it has taken longer to improve this time.   Review of Systems  All other systems reviewed and are negative.      Objective:   Physical Exam  Constitutional: She is oriented to person, place, and time. She appears well-developed and well-nourished.  HENT:  Head: Normocephalic and atraumatic.  Cardiovascular: Normal rate, regular rhythm and normal heart sounds.   Pulmonary/Chest: Effort normal and breath sounds normal.  Musculoskeletal:  No spine tenderness to palpation.  Some tightness and tenderness over Paraspinous low back muscles.  Negative straight leg test, bilaterally.  Patellar reflexes 2+ and symmetric.  ROM at waist normal with discomfort with bending forward and sided to side movements.   Neurological: She is alert and oriented to person, place, and time.  Psychiatric: She has a normal mood and affect. Her behavior is normal.          Assessment & Plan:  Hypertension-well controlled today. Medication refilled for 6 months.   Bilateral low back pain without sciatica-I do think this is more of an muscular etiology. I do feel like there could be some muscle spasms or strain involved. I did give her some home exercises to start. mobic as needed daily for next week. Flexeril at bedtime. Sedation warning given. Follow up as needed.

## 2015-04-19 ENCOUNTER — Telehealth: Payer: Self-pay | Admitting: Physician Assistant

## 2015-04-19 NOTE — Telephone Encounter (Signed)
Received fax for prior authorization on Nystatin Triamcinolone sent through cover my meds waiting on authorization. - CF

## 2015-04-19 NOTE — Telephone Encounter (Signed)
Received fax for prior authorization on Cyclobenzaprine sent through cover my meds waiting on authorization. - CF

## 2015-04-22 NOTE — Telephone Encounter (Signed)
Received fax from Medicare and Cyclobenzaprine HCL has been denied due to not being a covered drug and patient has to try 2 of the covered medications first. - CF

## 2015-04-23 ENCOUNTER — Encounter: Payer: Self-pay | Admitting: Physician Assistant

## 2015-04-23 ENCOUNTER — Ambulatory Visit (INDEPENDENT_AMBULATORY_CARE_PROVIDER_SITE_OTHER): Payer: Medicare Other | Admitting: Physician Assistant

## 2015-04-23 VITALS — BP 138/74 | HR 75 | Ht 59.0 in | Wt 164.0 lb

## 2015-04-23 DIAGNOSIS — R21 Rash and other nonspecific skin eruption: Secondary | ICD-10-CM | POA: Diagnosis not present

## 2015-04-23 MED ORDER — NYSTATIN 100000 UNIT/GM EX OINT
1.0000 | TOPICAL_OINTMENT | Freq: Two times a day (BID) | CUTANEOUS | Status: DC
Start: 2015-04-23 — End: 2016-03-23

## 2015-04-23 NOTE — Progress Notes (Signed)
Bonnie Torres is a 59 y.o. female who presents to [facility] today for rash. Ms. Nazir states that she began noticing this rash on her sides near her pelvis several weeks ago. She states that the rash is itchy, especially after showering. She denies ever having this rash before. She has tried several OTC creams as well as a prescription cream that her mother had, but none of them provided any relief. She denies any redness, flaking, oozing or tenderness. We previously sent nystatin/triamcinolone but could not afford. Not tried anything else.    Past Medical History  Diagnosis Date  . Hypertension   . Hyperlipidemia    Past Surgical History  Procedure Laterality Date  . Shoulder surgery Right 2010   Social History  Substance Use Topics  . Smoking status: Never Smoker   . Smokeless tobacco: Not on file  . Alcohol Use: No   family history includes Cancer in her paternal aunt; Diabetes in her maternal aunt; Heart attack in her paternal aunt; Hypertension in her mother.  ROS as above Medications: Current Outpatient Prescriptions  Medication Sig Dispense Refill  . atenolol (TENORMIN) 100 MG tablet TAKE 1 TABLET(100 MG) BY MOUTH DAILY 90 tablet 0  . cloNIDine (CATAPRES) 0.2 MG tablet TAKE 1 TABLET(0.2 MG) BY MOUTH AT BEDTIME 90 tablet 0  . meloxicam (MOBIC) 15 MG tablet Take 1 tablet (15 mg total) by mouth daily. As needed for back pain 30 tablet 1  . pravastatin (PRAVACHOL) 40 MG tablet Take 1 tablet (40 mg total) by mouth daily. 90 tablet 3  . TRIBENZOR 20-5-12.5 MG TABS TAKE 1 TABLET BY MOUTH DAILY 90 tablet 0  . nystatin ointment (MYCOSTATIN) Apply 1 application topically 2 (two) times daily. 60 g 0   No current facility-administered medications for this visit.   No Known Allergies   Exam:  BP 138/74 mmHg  Pulse 75  Ht 4\' 11"  (1.499 m)  Wt 164 lb (74.39 kg)  BMI 33.11 kg/m2 Gen: WDWN African-American female in no acute distress.   HEENT: EOMI,  MMM Lungs: Normal work of breathing. CTABL Heart: RRR no MRG Abd: NABS, Soft. Nondistended, Nontender. Bilaterally areas of hyperpigmentation and dryness on intertriginous areas. Patient denies tenderness to touch.  Exts: Brisk capillary refill, warm and well perfused.   Assessment: Intertriginous candidiasis based on patient description of symptoms and physical exam findings.   Plan: Patient given prescription for Nystatin ointment BID for candidiasis. Patient instructed to return if symptoms do not improve within 7-10 days.  Reviewed and changes made by Iran Planas PA-C

## 2015-04-29 NOTE — Telephone Encounter (Signed)
Received another form from [facility] filled it out and it faxed it back. - CF

## 2015-05-01 ENCOUNTER — Other Ambulatory Visit: Payer: Self-pay | Admitting: Physician Assistant

## 2015-05-01 MED ORDER — CLOTRIMAZOLE-BETAMETHASONE 1-0.05 % EX CREA: 1.0000 "application " | TOPICAL_CREAM | Freq: Two times a day (BID) | CUTANEOUS | Status: DC

## 2015-05-01 NOTE — Progress Notes (Signed)
Call pt: sent lotrisone and see if approved by insurance.

## 2015-05-02 NOTE — Progress Notes (Signed)
Pt notified of rx. 

## 2015-05-29 LAB — IFOBT (OCCULT BLOOD): IFOBT: NEGATIVE

## 2015-06-28 ENCOUNTER — Other Ambulatory Visit: Payer: Self-pay | Admitting: Physician Assistant

## 2015-07-19 ENCOUNTER — Encounter: Payer: Self-pay | Admitting: Physician Assistant

## 2015-09-24 ENCOUNTER — Other Ambulatory Visit: Payer: Self-pay | Admitting: Physician Assistant

## 2015-09-24 DIAGNOSIS — Z139 Encounter for screening, unspecified: Secondary | ICD-10-CM

## 2015-09-27 ENCOUNTER — Other Ambulatory Visit: Payer: Self-pay | Admitting: Physician Assistant

## 2015-09-30 ENCOUNTER — Other Ambulatory Visit: Payer: Self-pay | Admitting: *Deleted

## 2015-09-30 MED ORDER — OLMESARTAN-AMLODIPINE-HCTZ 20-5-12.5 MG PO TABS: 1.0000 | ORAL_TABLET | Freq: Every day | ORAL | Status: DC

## 2015-09-30 MED ORDER — CLONIDINE HCL 0.2 MG PO TABS: ORAL_TABLET | ORAL | Status: DC

## 2015-09-30 MED ORDER — ATENOLOL 100 MG PO TABS: ORAL_TABLET | ORAL | Status: DC

## 2015-10-02 ENCOUNTER — Ambulatory Visit: Payer: Medicare Other

## 2015-10-14 ENCOUNTER — Encounter: Payer: Self-pay | Admitting: Physician Assistant

## 2015-10-14 ENCOUNTER — Ambulatory Visit (INDEPENDENT_AMBULATORY_CARE_PROVIDER_SITE_OTHER): Payer: Medicare Other | Admitting: Physician Assistant

## 2015-10-14 VITALS — BP 137/80 | HR 76 | Ht 59.0 in | Wt 159.0 lb

## 2015-10-14 DIAGNOSIS — E785 Hyperlipidemia, unspecified: Secondary | ICD-10-CM

## 2015-10-14 DIAGNOSIS — Z1159 Encounter for screening for other viral diseases: Secondary | ICD-10-CM | POA: Diagnosis not present

## 2015-10-14 DIAGNOSIS — Z131 Encounter for screening for diabetes mellitus: Secondary | ICD-10-CM

## 2015-10-14 DIAGNOSIS — M1000 Idiopathic gout, unspecified site: Secondary | ICD-10-CM

## 2015-10-14 LAB — COMPLETE METABOLIC PANEL WITH GFR
ALBUMIN: 4.1 g/dL (ref 3.6–5.1)
ALK PHOS: 46 U/L (ref 33–130)
ALT: 8 U/L (ref 6–29)
AST: 12 U/L (ref 10–35)
BUN: 10 mg/dL (ref 7–25)
CO2: 27 mmol/L (ref 20–31)
CREATININE: 0.95 mg/dL (ref 0.50–1.05)
Calcium: 9.3 mg/dL (ref 8.6–10.4)
Chloride: 102 mmol/L (ref 98–110)
GFR, Est African American: 76 mL/min (ref >=60)
GFR, Est Non African American: 66 mL/min (ref >=60)
GLUCOSE: 101 mg/dL — AB (ref 65–99)
POTASSIUM: 3.5 mmol/L (ref 3.5–5.3)
SODIUM: 138 mmol/L (ref 135–146)
TOTAL PROTEIN: 7.2 g/dL (ref 6.1–8.1)
Total Bilirubin: 0.5 mg/dL (ref 0.2–1.2)

## 2015-10-14 LAB — LIPID PANEL
CHOL/HDL RATIO: 6.7 ratio — AB (ref <=5.0)
Cholesterol: 193 mg/dL (ref 125–200)
HDL: 29 mg/dL — ABNORMAL LOW (ref >=46)
LDL Cholesterol: 135 mg/dL — ABNORMAL HIGH (ref <130)
Triglycerides: 147 mg/dL (ref <150)
VLDL: 29 mg/dL (ref <30)

## 2015-10-14 LAB — URIC ACID: Uric Acid, Serum: 8.7 mg/dL — ABNORMAL HIGH (ref 2.4–7.0)

## 2015-10-14 LAB — HEPATITIS C ANTIBODY: HCV Ab: NEGATIVE

## 2015-10-14 MED ORDER — PRAVASTATIN SODIUM 40 MG PO TABS: ORAL_TABLET | ORAL | Status: DC

## 2015-10-14 MED ORDER — CLONIDINE HCL 0.2 MG PO TABS: ORAL_TABLET | ORAL | Status: DC

## 2015-10-14 MED ORDER — ATENOLOL 100 MG PO TABS: ORAL_TABLET | ORAL | Status: DC

## 2015-10-14 MED ORDER — OLMESARTAN-AMLODIPINE-HCTZ 20-5-12.5 MG PO TABS: 1.0000 | ORAL_TABLET | Freq: Every day | ORAL | Status: DC

## 2015-10-14 NOTE — Patient Instructions (Signed)

## 2015-10-16 DIAGNOSIS — M1000 Idiopathic gout, unspecified site: Secondary | ICD-10-CM | POA: Insufficient documentation

## 2015-10-16 MED ORDER — COLCHICINE 0.6 MG PO TABS: 0.6000 mg | ORAL_TABLET | Freq: Every day | ORAL | Status: DC

## 2015-10-16 MED ORDER — ALLOPURINOL 100 MG PO TABS: 100.0000 mg | ORAL_TABLET | Freq: Every day | ORAL | Status: DC

## 2015-10-16 NOTE — Addendum Note (Signed)
Addended by: Donella Stade on: 10/16/2015 01:35 PM   Modules accepted: Orders

## 2015-10-16 NOTE — Progress Notes (Signed)
   Subjective:    Patient ID: Bonnie Torres, female    DOB: 03/04/1956, 60 y.o.   MRN: [id number]  HPI Pt is a 60 yo female who presents to the clinic for medication refill.   hyperlipidemia taking Pravachol. Not had lipid level checked in a while. Needs refill.   HTN- denies any CP, palpitations, headaches, SOB, dizziness. Taking tribenzor and atenolol.   Has had gout a few times. She was never put on preventative. She has eaten a lot of fish and shrimp over the past week. Pain is in right great toe. Seems to be getting a little better but a few days she could not even walk on it. Not taking anything to make better.    Review of Systems  All other systems reviewed and are negative.      Objective:   Physical Exam  Constitutional: She is oriented to person, place, and time. She appears well-developed and well-nourished.  HENT:  Head: Normocephalic and atraumatic.  Cardiovascular: Normal rate, regular rhythm and normal heart sounds.   Pulmonary/Chest: Effort normal and breath sounds normal.  Musculoskeletal:  Great right toe- tender to palpation and warm to touch.  Neurological: She is alert and oriented to person, place, and time.  Psychiatric: She has a normal mood and affect. Her behavior is normal.          Assessment & Plan:  Hyperlipidemia- will check today and adjust medication as needed.   HTN- controlled. Refilled atenolol and tribenzor. cmp ordered.   Gout, right great toe- mobic for next few days can help. Uric acid ordered. Would like for pt to consider preventative.   Screening labs ordered today.

## 2015-10-23 ENCOUNTER — Ambulatory Visit (INDEPENDENT_AMBULATORY_CARE_PROVIDER_SITE_OTHER): Payer: Medicare Other

## 2015-10-23 DIAGNOSIS — Z139 Encounter for screening, unspecified: Secondary | ICD-10-CM

## 2015-10-23 DIAGNOSIS — Z1231 Encounter for screening mammogram for malignant neoplasm of breast: Secondary | ICD-10-CM

## 2015-10-23 IMAGING — MG MM DIGITAL SCREENING
6 series · 6 of 6 positions shown · non-contrast
Comparison: Previous exam(s).

CLINICAL DATA: Screening.

EXAM:
DIGITAL SCREENING BILATERAL MAMMOGRAM WITH CAD

[R CC]
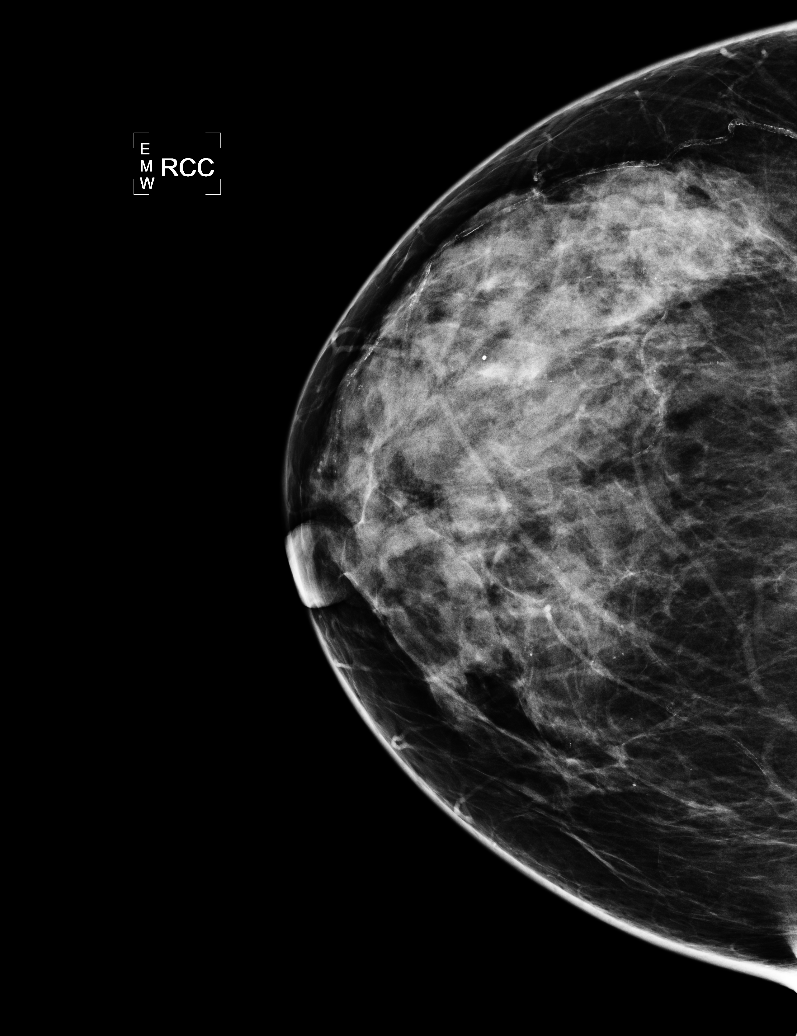

[L CC]
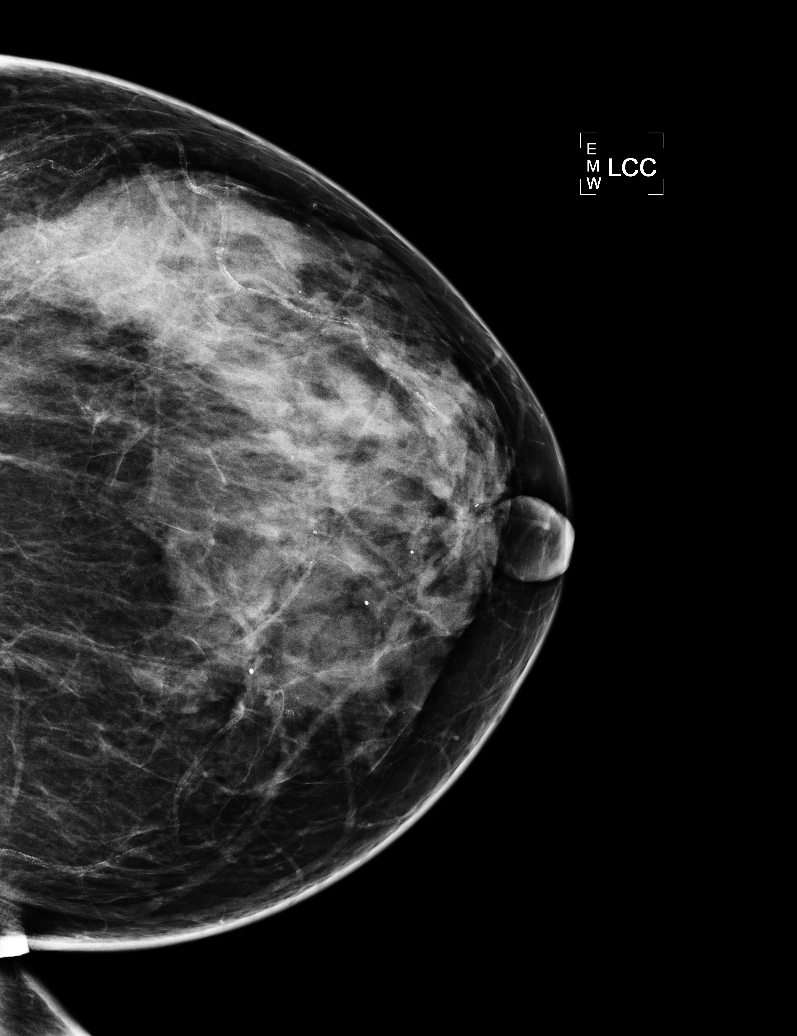

[L MLO (1 of 2)]
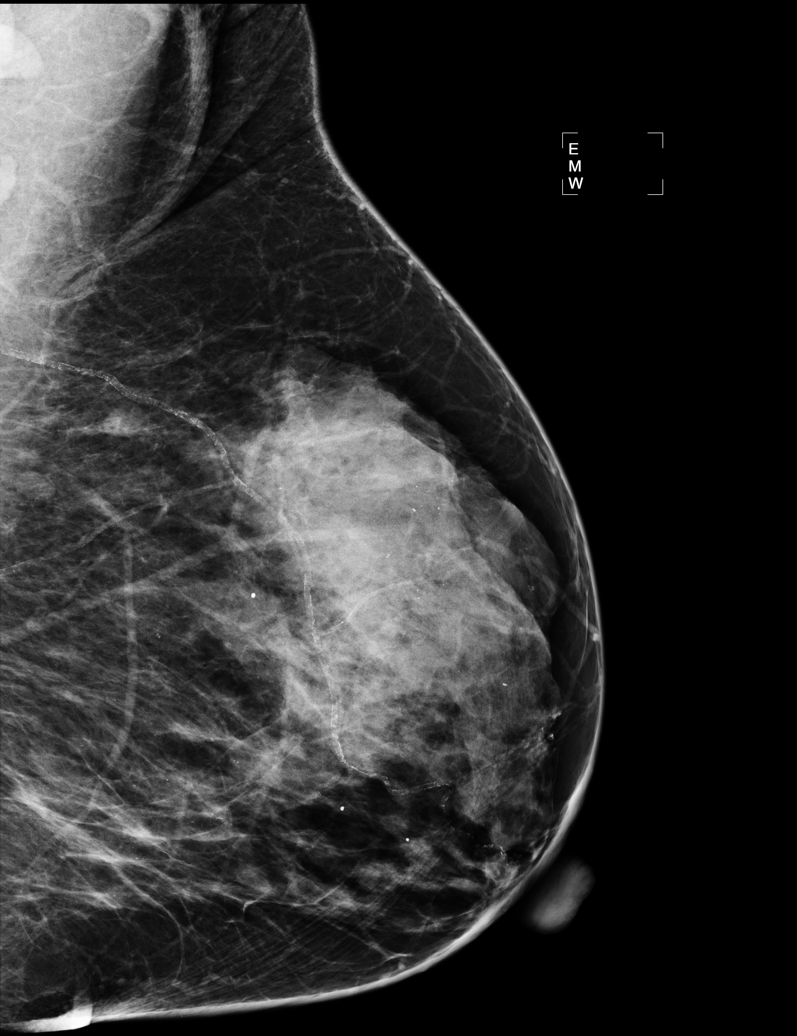

[R MLO (1 of 2)]
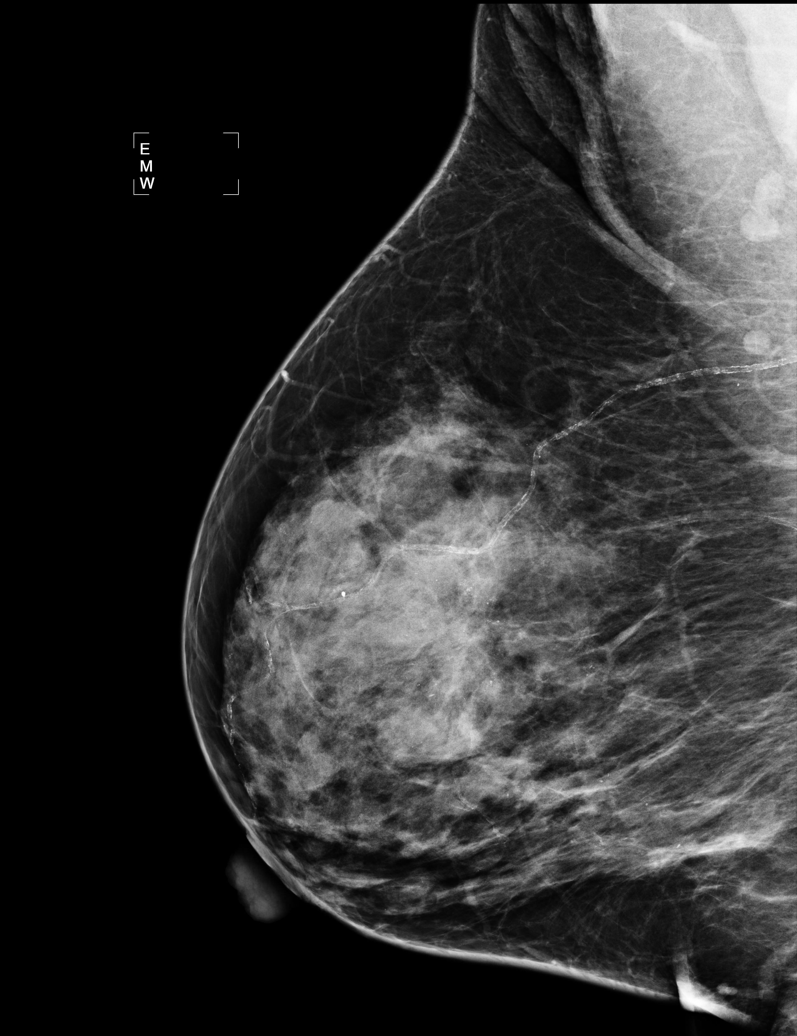

[L MLO (2 of 2)]
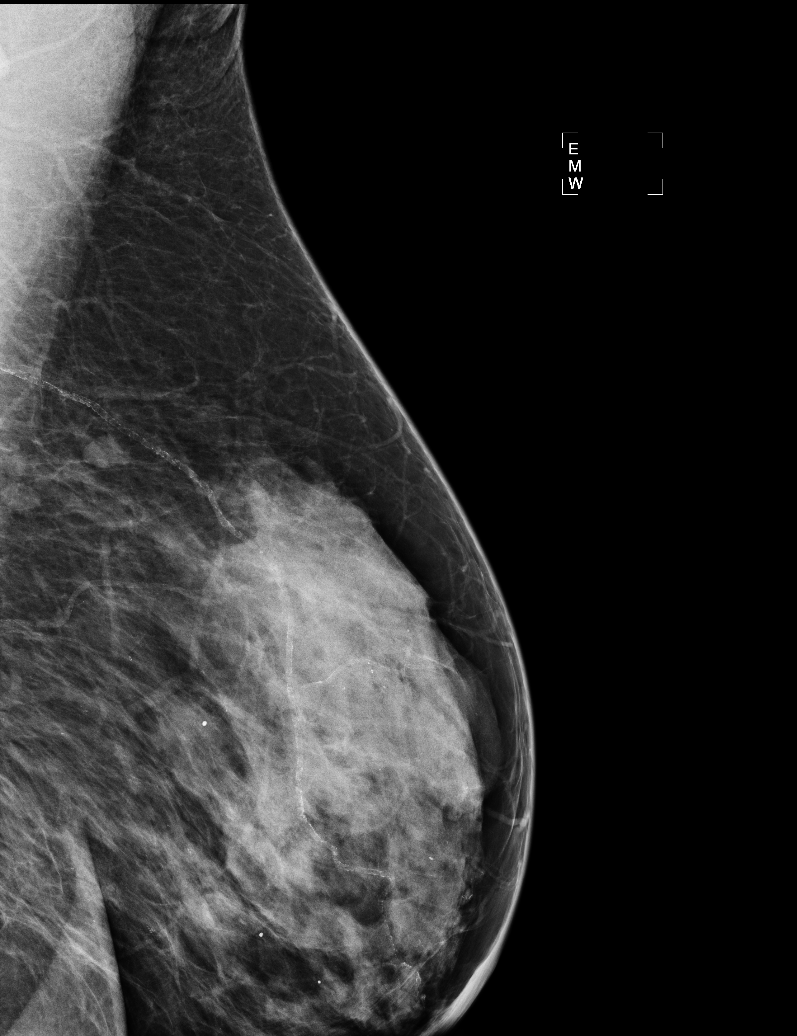

[R MLO (2 of 2)]
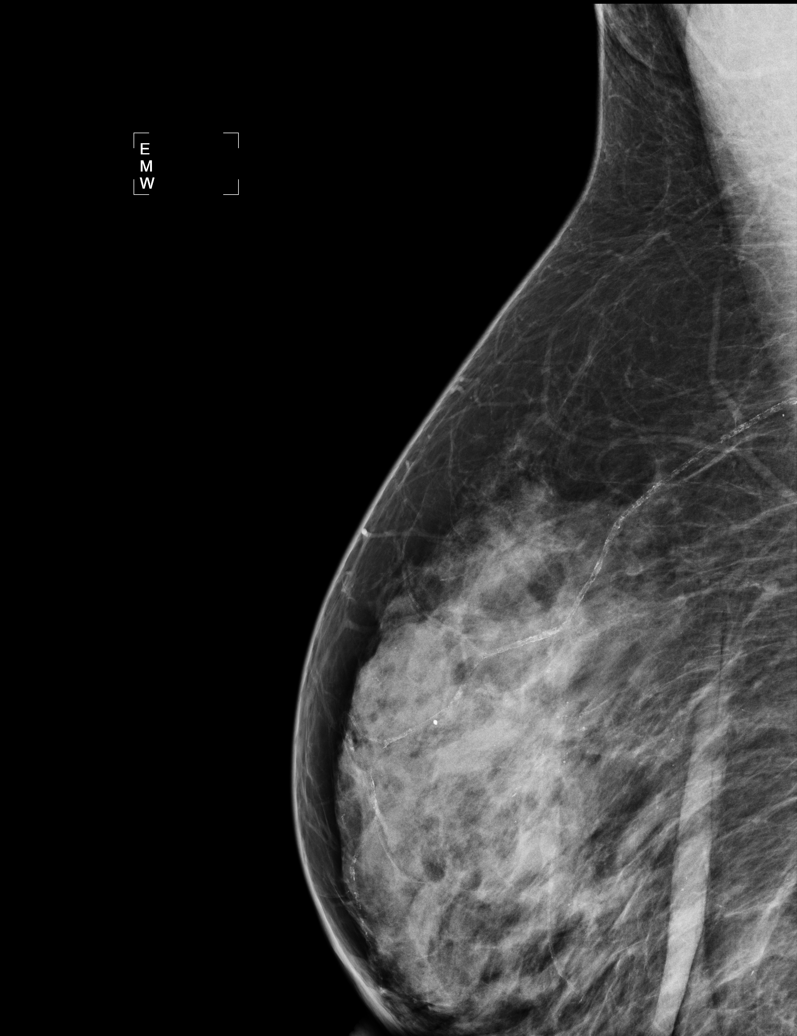

[6 of 6 positions shown; findings below may reference images not displayed]

ACR Breast Density Category c: The breast tissue is heterogeneously
dense, which may obscure small masses.
FINDINGS: In the right breast, possible distortion with calcifications
warrants further evaluation. In the left breast, no findings
suspicious for malignancy. Images were processed with CAD.
IMPRESSION: Further evaluation is suggested for possible distortion with
calcifications in the right breast.

RECOMMENDATION:
Diagnostic mammogram and possibly ultrasound of the right breast.
(Code:[MM])

The patient will be contacted regarding the findings, and additional
imaging will be scheduled.

BI-RADS CATEGORY  0: Incomplete. Need additional imaging evaluation
and/or prior mammograms for comparison.

## 2015-11-08 ENCOUNTER — Other Ambulatory Visit: Payer: Self-pay | Admitting: Physician Assistant

## 2015-11-08 DIAGNOSIS — R928 Other abnormal and inconclusive findings on diagnostic imaging of breast: Secondary | ICD-10-CM

## 2015-11-13 ENCOUNTER — Encounter: Payer: Self-pay | Admitting: Physician Assistant

## 2015-11-13 DIAGNOSIS — R921 Mammographic calcification found on diagnostic imaging of breast: Secondary | ICD-10-CM | POA: Insufficient documentation

## 2015-11-21 ENCOUNTER — Ambulatory Visit
Admission: RE | Admit: 2015-11-21 | Discharge: 2015-11-21 | Disposition: A | Discharge status: Home / Self Care | Payer: Medicare Other | Source: Ambulatory Visit | Attending: Physician Assistant | Admitting: Physician Assistant

## 2015-11-21 DIAGNOSIS — R928 Other abnormal and inconclusive findings on diagnostic imaging of breast: Secondary | ICD-10-CM

## 2015-11-21 DIAGNOSIS — N6489 Other specified disorders of breast: Secondary | ICD-10-CM | POA: Diagnosis not present

## 2015-11-21 DIAGNOSIS — R921 Mammographic calcification found on diagnostic imaging of breast: Secondary | ICD-10-CM | POA: Diagnosis not present

## 2015-11-21 IMAGING — MG MM DIAG BREAST TOMO UNI RIGHT
8 of 14 series · 8 of 30 positions shown · non-contrast
Comparison: Previous exam(s).

CLINICAL DATA: 60-year-old female presenting for screening recall
of possible distortion and calcifications in the right breast.

EXAM:
2D DIGITAL DIAGNOSTIC UNILATERAL RIGHT MAMMOGRAM WITH CAD AND
ADJUNCT TOMO
RIGHT BREAST ULTRASOUND

[R ML (1 of 3)]
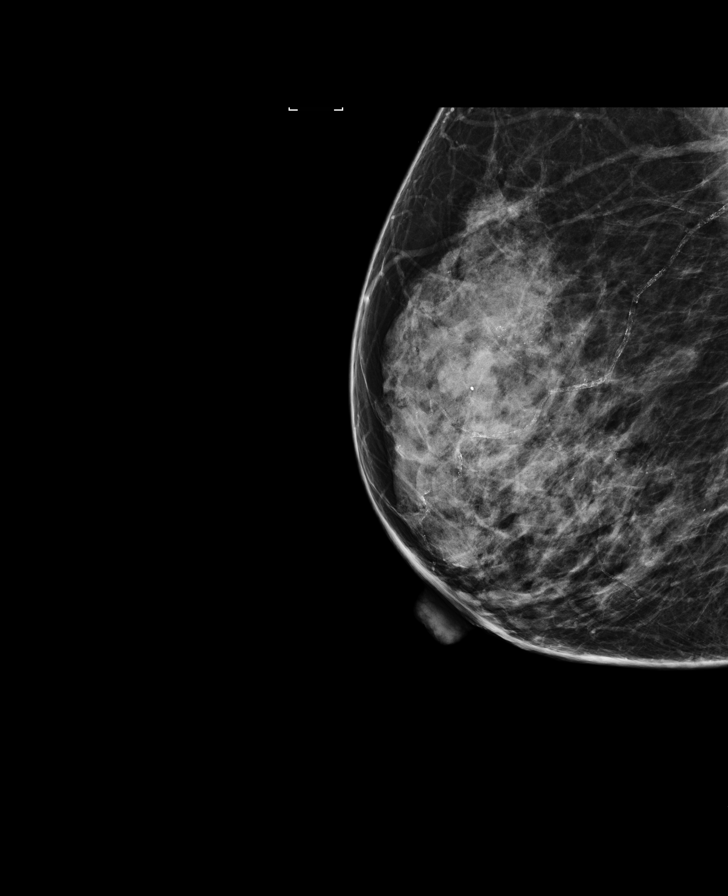

[R ML (2 of 3)]
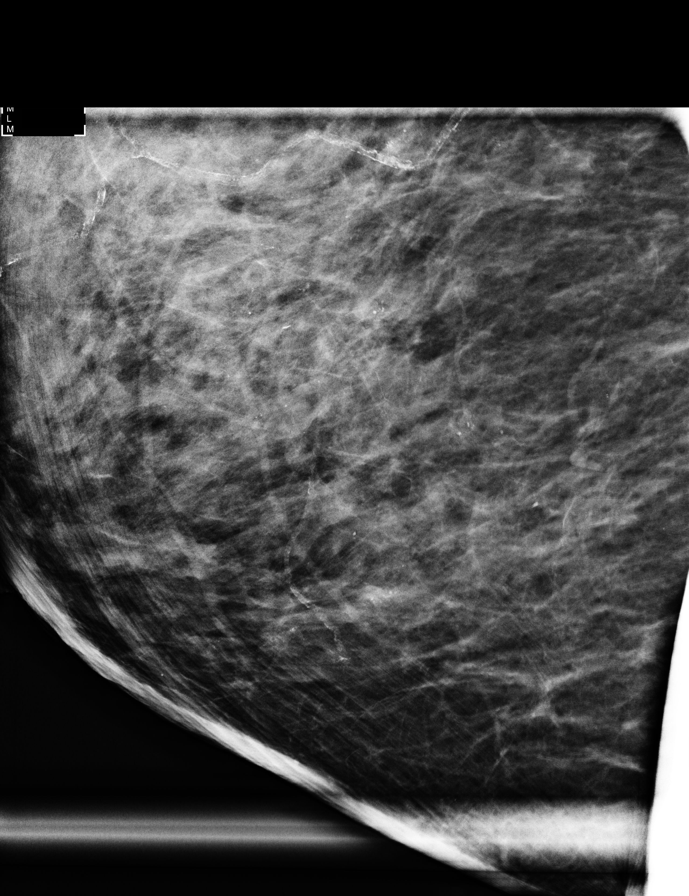

[R CC (1 of 2)]
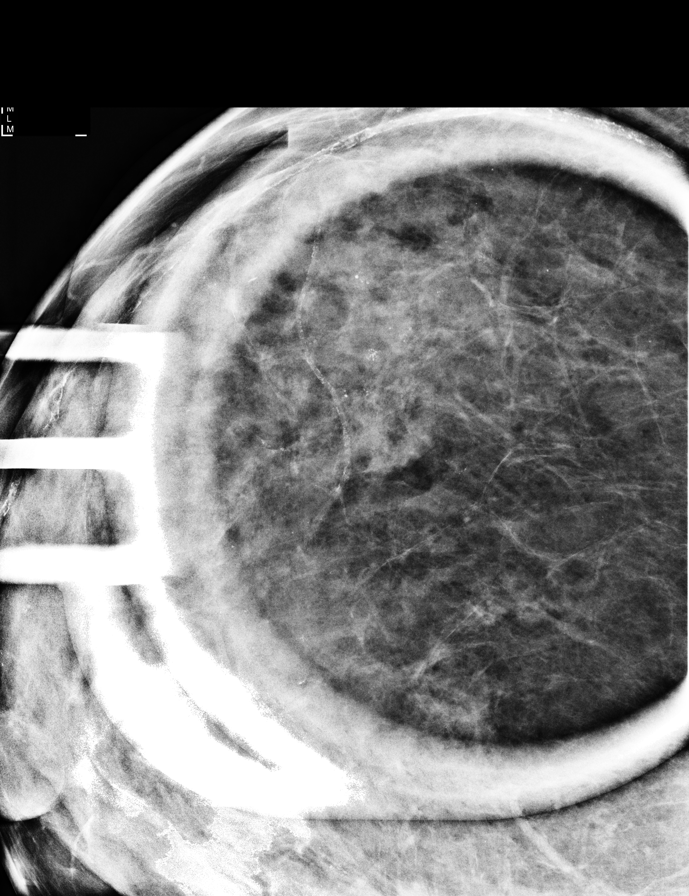

[R ML (3 of 3)]
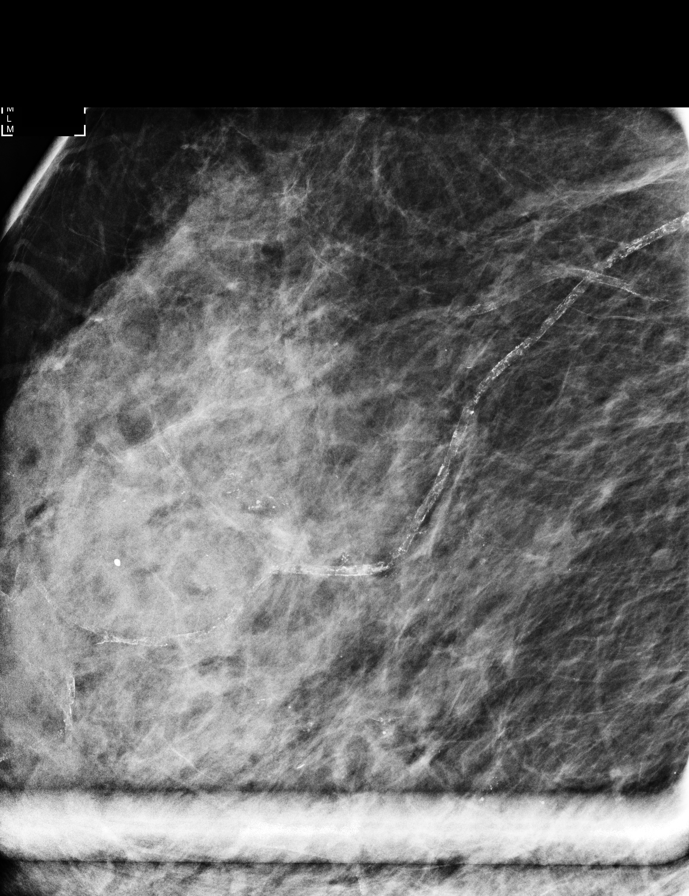

[R MLO (1 of 2)]
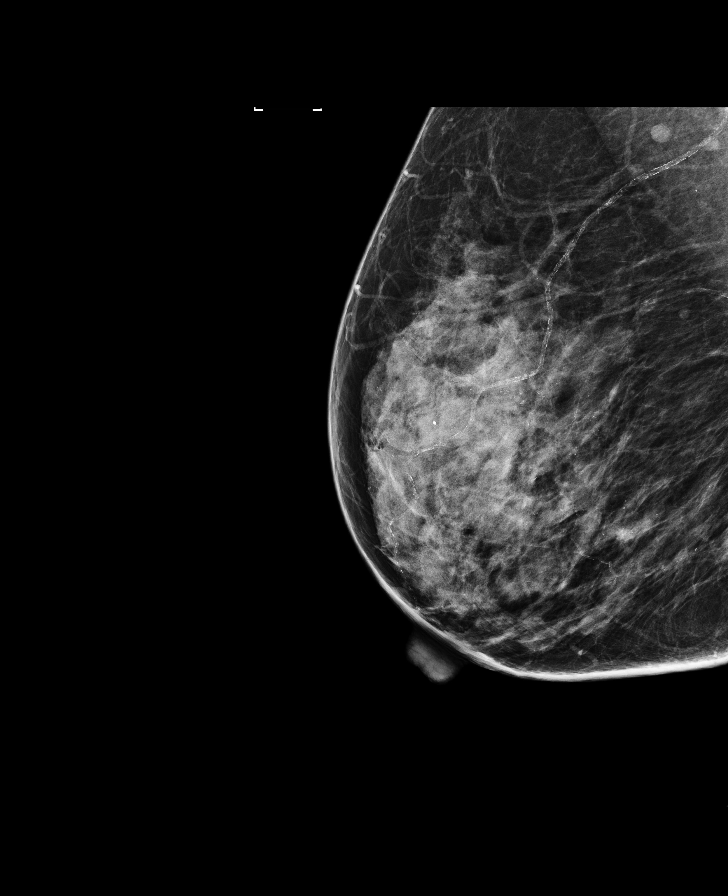

[R MLO (2 of 2)]
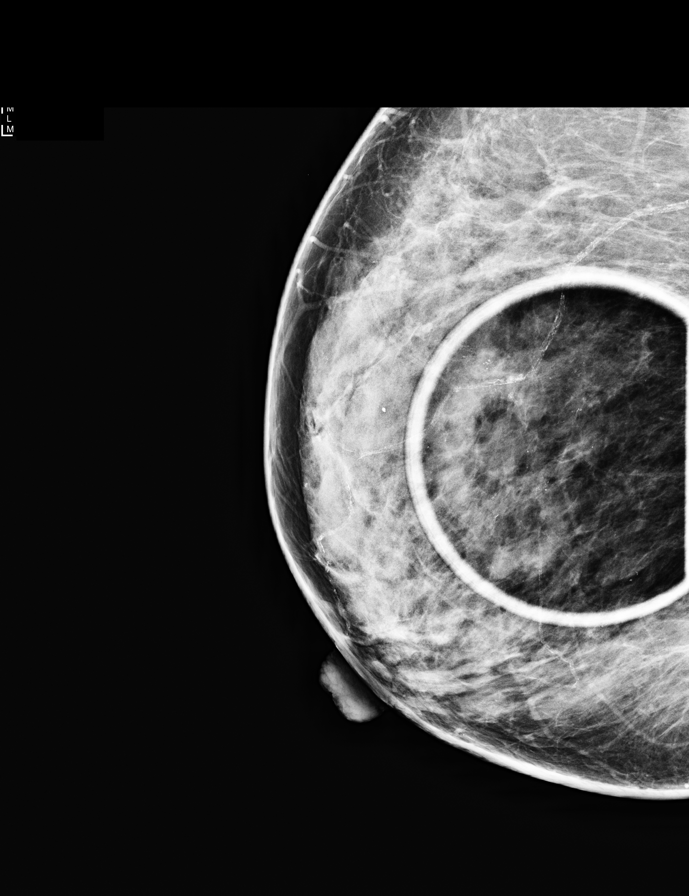

[R CC (2 of 2)]
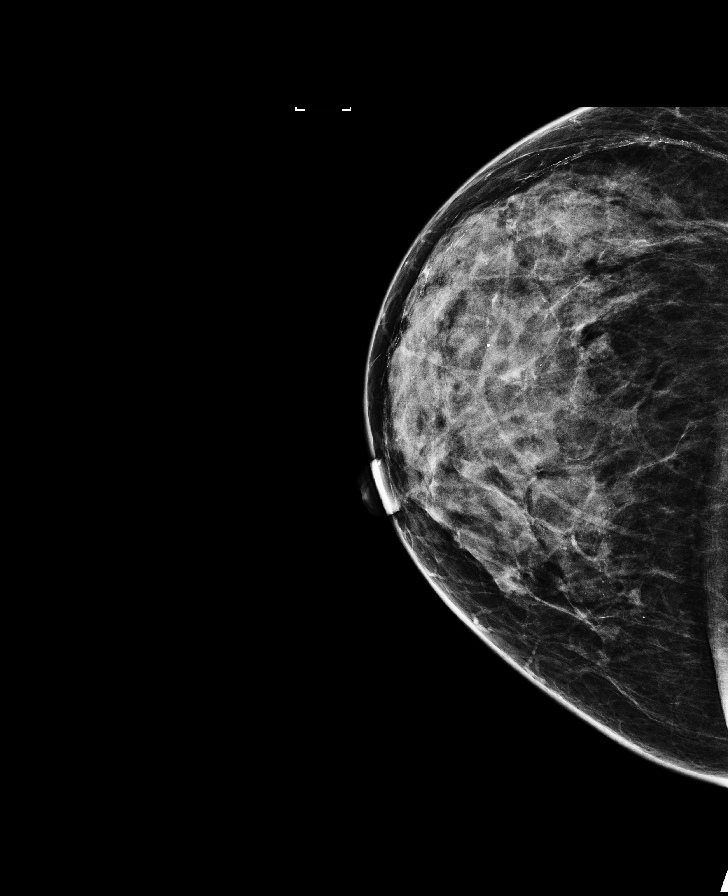

[R CC synth-2D]
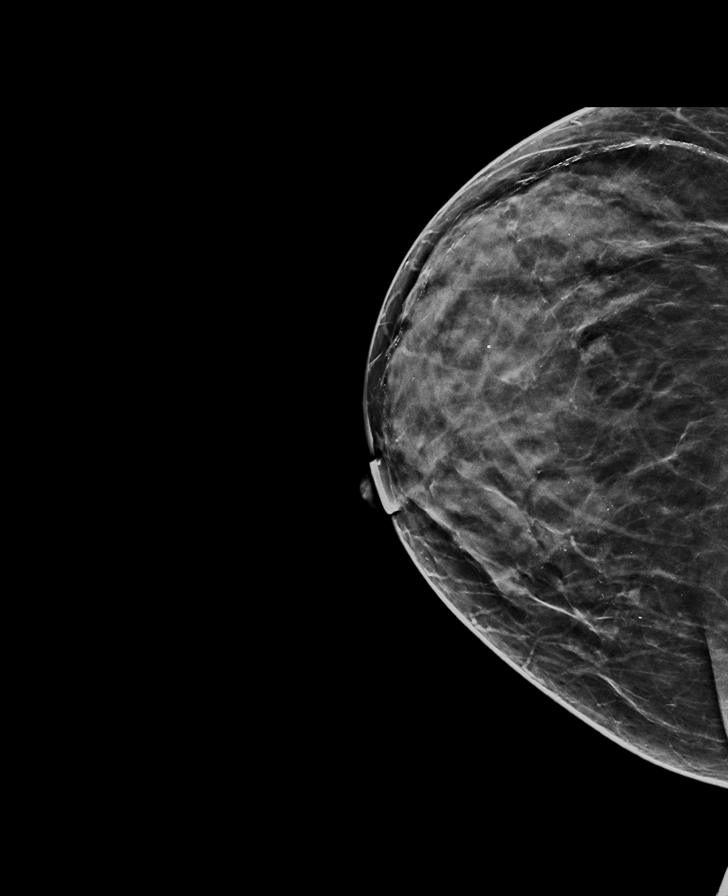

[8 of 30 positions shown; findings below may reference images not displayed]

ACR Breast Density Category d: The breast tissue is extremely dense,
which lowers the sensitivity of mammography.
FINDINGS: Diffusely throughout the right breast there are scattered and
occasionally grouped calcifications. The majority of calcifications
layer on the true lateral view consistent with benign milk of
calcium. There are no suspicious morphology or distribution of these
calcifications. There is persistent distortion in the lateral aspect
of the right breast for which ultrasound will be performed.

Mammographic images were processed with CAD.

Physical exam of the lateral aspect of the left breast demonstrates
a well-healed surgical scar at 9 o'clock. No discrete palpable
masses are identified. The patient states she had a remote benign
excisional biopsy.

Ultrasound targeted to the right breast demonstrates a subtle area
of distortion deep to the scar marker at 9 o'clock. Otherwise, there
are no suspicious masses or areas of shadowing in the lateral right
breast.
IMPRESSION: 1. The distortion in the lateral right breast corresponds with a
benign surgical scar.

2. The calcifications in the right breast correspond with benign
milk of calcium.

RECOMMENDATION:
Screening mammogram in one year.(Code:[HL])

I have discussed the findings and recommendations with the patient.
Results were also provided in writing at the conclusion of the
visit. If applicable, a reminder letter will be sent to the patient
regarding the next appointment.

BI-RADS CATEGORY  2: Benign.

## 2015-11-21 IMAGING — US US BREAST LTD UNI RIGHT INC AXILLA
1 series · 2 of 2 positions shown · non-contrast
Comparison: Previous exam(s).

CLINICAL DATA: 60-year-old female presenting for screening recall
of possible distortion and calcifications in the right breast.

EXAM:
2D DIGITAL DIAGNOSTIC UNILATERAL RIGHT MAMMOGRAM WITH CAD AND
ADJUNCT TOMO
RIGHT BREAST ULTRASOUND

[Series 1: us breast ltd uni right inc axilla · 0.07mm/px · 2 of 2 slices shown]
[im 1/2]
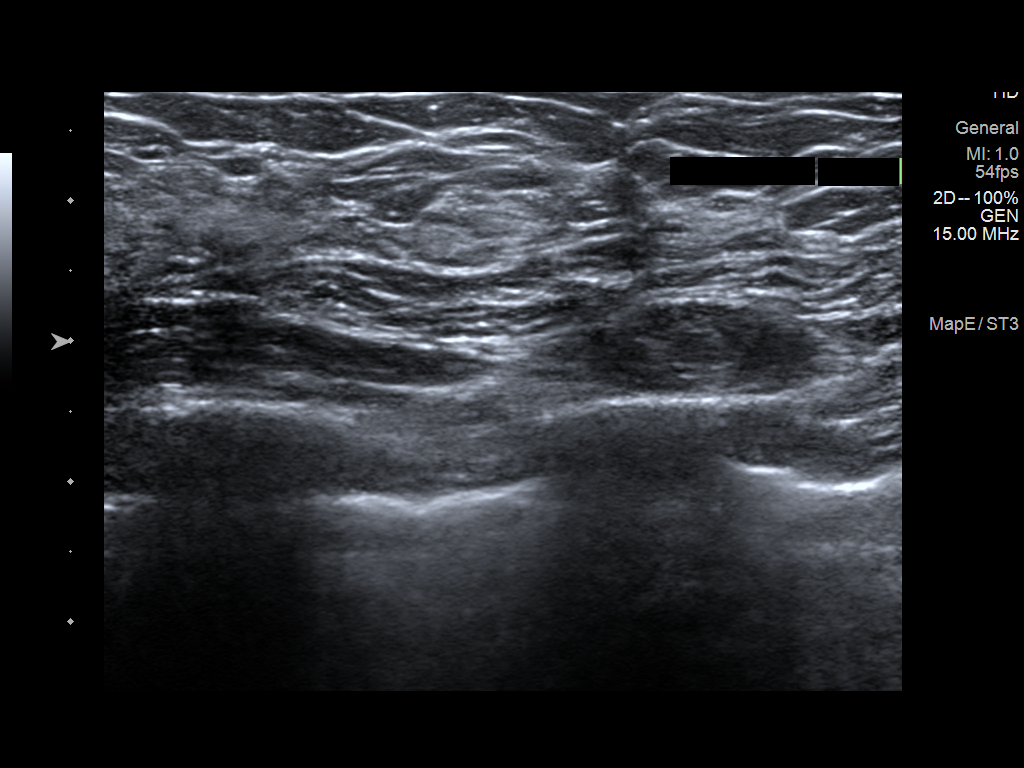
[im 2/2]
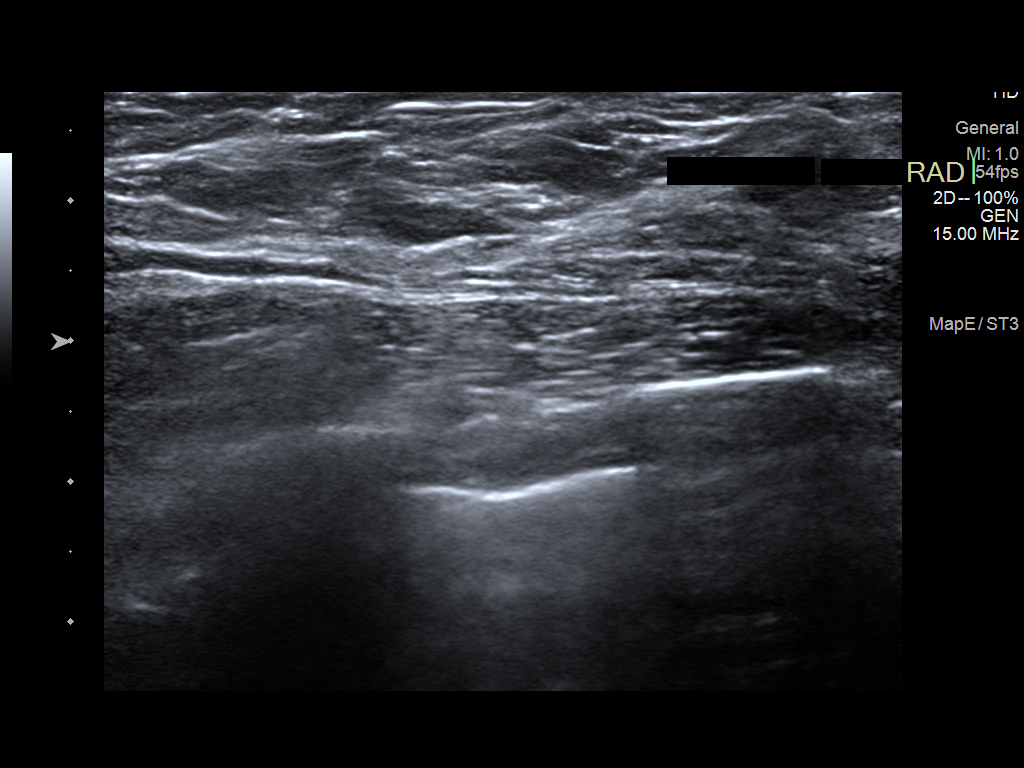

[2 of 2 positions shown; findings below may reference images not displayed]

ACR Breast Density Category d: The breast tissue is extremely dense,
which lowers the sensitivity of mammography.
FINDINGS: Diffusely throughout the right breast there are scattered and
occasionally grouped calcifications. The majority of calcifications
layer on the true lateral view consistent with benign milk of
calcium. There are no suspicious morphology or distribution of these
calcifications. There is persistent distortion in the lateral aspect
of the right breast for which ultrasound will be performed.

Mammographic images were processed with CAD.

Physical exam of the lateral aspect of the left breast demonstrates
a well-healed surgical scar at 9 o'clock. No discrete palpable
masses are identified. The patient states she had a remote benign
excisional biopsy.

Ultrasound targeted to the right breast demonstrates a subtle area
of distortion deep to the scar marker at 9 o'clock. Otherwise, there
are no suspicious masses or areas of shadowing in the lateral right
breast.
IMPRESSION: 1. The distortion in the lateral right breast corresponds with a
benign surgical scar.

2. The calcifications in the right breast correspond with benign
milk of calcium.

RECOMMENDATION:
Screening mammogram in one year.(Code:[HL])

I have discussed the findings and recommendations with the patient.
Results were also provided in writing at the conclusion of the
visit. If applicable, a reminder letter will be sent to the patient
regarding the next appointment.

BI-RADS CATEGORY  2: Benign.

## 2015-12-27 ENCOUNTER — Other Ambulatory Visit: Payer: Self-pay | Admitting: Physician Assistant

## 2016-02-18 LAB — FECAL OCCULT BLOOD, GUAIAC: Fecal Occult Blood: NEGATIVE

## 2016-03-23 ENCOUNTER — Ambulatory Visit (INDEPENDENT_AMBULATORY_CARE_PROVIDER_SITE_OTHER): Payer: Medicare Other | Admitting: Physician Assistant

## 2016-03-23 ENCOUNTER — Encounter: Payer: Self-pay | Admitting: Physician Assistant

## 2016-03-23 VITALS — BP 145/65 | HR 72 | Ht 59.0 in | Wt 164.0 lb

## 2016-03-23 DIAGNOSIS — Z0000 Encounter for general adult medical examination without abnormal findings: Secondary | ICD-10-CM | POA: Diagnosis not present

## 2016-03-23 DIAGNOSIS — Z23 Encounter for immunization: Secondary | ICD-10-CM | POA: Diagnosis not present

## 2016-03-23 DIAGNOSIS — I10 Essential (primary) hypertension: Secondary | ICD-10-CM

## 2016-03-23 DIAGNOSIS — G4700 Insomnia, unspecified: Secondary | ICD-10-CM | POA: Diagnosis not present

## 2016-03-23 DIAGNOSIS — L680 Hirsutism: Secondary | ICD-10-CM

## 2016-03-23 MED ORDER — NYSTATIN 100000 UNIT/GM EX OINT: 1.0000 "application " | TOPICAL_OINTMENT | Freq: Two times a day (BID) | CUTANEOUS | 2 refills | Status: DC

## 2016-03-23 MED ORDER — CLONIDINE HCL 0.3 MG PO TABS: 0.3000 mg | ORAL_TABLET | Freq: Two times a day (BID) | ORAL | 1 refills | Status: DC

## 2016-03-23 NOTE — Patient Instructions (Signed)
Hirsutism and Masculinization Hirsutism (increased body hair) is the growth of colored (pigmented) thick hair in women. It is most noticeable when it is on the moustache or beard areas. The other common sites are the:  Chest.  Abdomen.  Thighs.  Back. Pubic hair growth may run upward from the usual bikini line to the middle of the abdomen.  Virilism (masculinization) is more extensive than hirsutism. It has extra symptoms. There may be:  Acne.  Oily skin.  Baldness.  Enlargement of the clitoris.  Increased sex drive (libido).  Voice deepening.  Reduced breast size.  Irregular or absent periods.  Aggression. The scalp hair growth may also bald in a typical female pattern. CAUSES  This is caused by too much female sex hormone (androgen) in the body. It can be produced by the ovaries, adrenal glands, and within the skin. Hirsutism is most commonly related to polycystic ovarian syndrome (PCOS). The first signs of increased androgen levels are hirsutism and acne. How sensitive each person is to hormone levels varies greatly. Virilism may result from higher androgen levels. Some women with hirsutism have normal hormone levels. Eventually there may be female pattern balding. These problems are also connected to difficulty in having children (infertility). In addition, both malignant and benign tumors may cause hirsutism such as tumors of the adrenal gland (adenomas or adenocarcinomas) but this is a rare cause. There is also evidence that insulin resistance may cause the androgynism. This problem is treated with some success with a medicine for diabetes (metformin). Causes that come from outside the body (exogenous) may also lead to hirsutism such as intake of androgens by mouth.  Note: Women of [place], [place], and [place] commonly have facial, abdominal, and thigh hair that is normal for them.  TREATMENT  There are medical treatments that inhibit these  conditions, such as:  Combined oral contraceptive pills, if you are not trying to become pregnant.  Medicines that stop the production of hormones (gonadotropins).  Steroids. This may be used if there is evidence of congenital (present since birth) adrenal hyperplasia (abnormal growth of cells).  Metformin for the treatment of virilization, if sensitive to insulin.  Suppression of ovarian hormone production with GnRH analogues (a hormone). They can only be used by themselves for short periods of time. There are a variety of cosmetic treatments. These may be all that you need. They may be effective against occasional problems.  Shaving is the simplest and most effective in the short term. Bleaching is not usually suitable for severe hirsutism.  Plucking, waxing, sugaring (similar to waxing), and depilatory creams are effective. However, on occasion, they can result in skin irritation (inflammation) or infection.  Electrolysis is effective. Your caregiver can help you decide what needs to be done and what course of treatment will be best for you. Your caregiver may refer you to an endocrinologist. This is a physician who is specialized in the treatment of glandular disorders.   This information is not intended to replace advice given to you by your health care provider. Make sure you discuss any questions you have with your health care provider.   Document Released: 08/24/2001 Document Revised: 07/06/2014 Document Reviewed: 10/10/2008 Elsevier Interactive Patient Education 2016 [employer]  Get These Tests  Blood Pressure- Have your blood pressure checked by your healthcare provider at least once a year.  Normal blood pressure is 120/80.  Weight- Have your body mass index (BMI) calculated to screen for obesity.  BMI is a measure of body fat based on height and weight.  You can calculate your own BMI at [employer]  Cholesterol- Have your cholesterol  checked every year.  Diabetes- Have your blood sugar checked every year if you have high blood pressure, high cholesterol, a family history of diabetes or if you are overweight.  Pap Test - Have a pap test every 1 to 5 years if you have been sexually active.  If you are older than 65 and recent pap tests have been normal you may not need additional pap tests.  In addition, if you have had a hysterectomy  for benign disease additional pap tests are not necessary.  Mammogram-Yearly mammograms are essential for early detection of breast cancer  Screening for Colon Cancer- Colonoscopy starting at age 33. Screening may begin sooner depending on your family history and other health conditions.  Follow up colonoscopy as directed by your Gastroenterologist.  Screening for Osteoporosis- Screening begins at age 10 with bone density scanning, sooner if you are at higher risk for developing Osteoporosis.  Get these medicines  Calcium with Vitamin D- Your body requires 1200-1500 mg of Calcium a day and [phone] IU of Vitamin D a day.  You can only absorb 500 mg of Calcium at a time therefore Calcium must be taken in 2 or 3 separate doses throughout the day.  Hormones- Hormone therapy has been associated with increased risk for certain cancers and heart disease.  Talk to your healthcare provider about if you need relief from menopausal symptoms.  Aspirin- Ask your healthcare provider about taking Aspirin to prevent Heart Disease and Stroke.  Get these Immuniztions  Flu shot- Every fall  Pneumonia shot- Once after the age of 15; if you are younger ask your healthcare provider if you need a pneumonia shot.  Tetanus- Every ten years.  Zostavax- Once after the age of 21 to prevent shingles.  Take these steps  Don't smoke- Your healthcare provider can help you quit. For tips on how to quit, ask your healthcare provider or go to www.smokefree.gov or call 1-800 QUIT-NOW.  Be physically active- Exercise  5 days a week for a minimum of 30 minutes.  If you are not already physically active, start slow and gradually work up to 30 minutes of moderate physical activity.  Try walking, dancing, bike riding, swimming, etc.  Eat a healthy diet- Eat a variety of healthy foods such as fruits, vegetables, whole grains, low fat milk, low fat cheeses, yogurt, lean meats, chicken, fish, eggs, dried beans, tofu, etc.  For more information go to www.thenutritionsource.org  Dental visit- Brush and floss teeth twice daily; visit your dentist twice a year.  Eye exam- Visit your Optometrist or Ophthalmologist yearly.  Drink alcohol in moderation- Limit alcohol intake to one drink or less a day.  Never drink and drive.  Depression- Your emotional health is as important as your physical health.  If you're feeling down or losing interest in things you normally enjoy, please talk to your healthcare provider.  Seat Belts- can save your life; always wear one  Smoke/Carbon Monoxide detectors- These detectors need to be installed on the appropriate level of your home.  Replace batteries at least once a year.  Violence- If anyone is threatening or hurting you, please tell your healthcare provider.  Living Will/ Health care power of attorney- Discuss with your healthcare provider and family.

## 2016-03-23 NOTE — Progress Notes (Addendum)
Subjective:     Patient ID: Bonnie Torres, female   DOB: [birth year], 58 y.o.   MRN: [id number]  HPI Patient is a 60 y.o. African- [place] female presenting today for her annual physical exam. The patient notes that she is still having difficulty falling asleep and staying asleep. The patient notes that she is currently only sleeping 2-3 hours a night. The patient denies any functional impairment secondary to her decreased sleep habits. The patient notes that she is currently taking Clonidine at night with only some symptomatic relief. The patient notes that she takes this every night. Additionally, the patient states that she is having increased hair growth on her face, chest, and diffusely over her entire body. The patient denies any abnormal mood swings, hot flashes, shortness of breath, palpitations, or chest pain.  Review of Systems  Constitutional: Negative for activity change, appetite change, chills, diaphoresis, fatigue, fever and unexpected weight change.  HENT: Negative for congestion, ear discharge, ear pain, postnasal drip, rhinorrhea, sinus pressure, sneezing and sore throat.   Eyes: Negative.  Negative for visual disturbance.  Respiratory: Negative for choking, chest tightness, shortness of breath and wheezing.   Cardiovascular: Negative for chest pain, palpitations and leg swelling.  Gastrointestinal: Negative.   Endocrine: Negative for polydipsia, polyphagia and polyuria.       Hair growth.  Genitourinary: Negative.   Musculoskeletal: Negative.   Skin: Negative.   Neurological: Negative for dizziness, syncope, weakness, light-headedness, numbness and headaches.  Psychiatric/Behavioral: Positive for sleep disturbance. Negative for behavioral problems, confusion, decreased concentration and dysphoric mood. The patient is not nervous/anxious.       Objective:   Physical Exam  Constitutional: She is oriented to person, place, and time. She appears well-developed and  well-nourished.  HENT:  Head: Normocephalic and atraumatic.  Right Ear: External ear normal.  Left Ear: External ear normal.  Nose: Nose normal.  Mouth/Throat: Oropharynx is clear and moist. No oropharyngeal exudate.  Eyes: Conjunctivae and EOM are normal. Pupils are equal, round, and reactive to light. Right eye exhibits no discharge. Left eye exhibits no discharge. No scleral icterus.  Neck: Normal range of motion. Neck supple. No JVD present. No tracheal deviation present. No thyromegaly present.  Cardiovascular: Normal rate, regular rhythm and intact distal pulses.  Exam reveals no gallop and no friction rub.   No murmur heard. Pulmonary/Chest: Effort normal and breath sounds normal. No stridor. No respiratory distress. She has no wheezes. She has no rales. She exhibits no tenderness.  Abdominal: Soft. Bowel sounds are normal. She exhibits no distension and no mass. There is no tenderness. There is no rebound and no guarding.  Lymphadenopathy:    She has no cervical adenopathy.  Neurological: She is alert and oriented to person, place, and time. She has normal reflexes. She displays normal reflexes. No cranial nerve deficit. She exhibits normal muscle tone. Coordination normal.  Skin: Skin is warm and dry. No rash noted. She is not diaphoretic. No erythema. No pallor.  Increased hair loss noted on patient extremities, face, and trunk.  Psychiatric: She has a normal mood and affect. Her behavior is normal. Judgment and thought content normal.      Assessment:     Bonnie Torres was seen today for annual exam.  Diagnoses and all orders for this visit:  Encounter for annual physical exam  Influenza vaccine needed -     Flu Vaccine QUAD 36+ mos PF IM (Fluarix & Fluzone Quad PF)  Insomnia -     cloNIDine (  CATAPRES) 0.3 MG tablet; Take 1 tablet (0.3 mg total) by mouth 2 (two) times daily.  Essential hypertension, benign -     cloNIDine (CATAPRES) 0.3 MG tablet; Take 1 tablet (0.3 mg total)  by mouth 2 (two) times daily.  Hirsutism -     nystatin ointment (MYCOSTATIN); Apply 1 application topically 2 (two) times daily. -     Cortisol, urine, free -     Testosterone       Plan:     1. Physical: Patient comes in today for annual physical examination. I performed thorough physical exam including review of routine labwork. Patient encouraged to engage in 150 minutes of exercise a week and take oral over-the-counter Vitamin D 800 units and Calcium 1500mg  daily and/or 4 servings of dairy a day. Patient given influenza vaccination. Patient to will be contacted via phone for lab review.  2. Insomnia - Patient given prescription to increase dose of clonidine to 0.3 mg nightly. Patient to continue to improve sleep hygiene and take over-the-counter. Patient to return-to-clinic if symptoms persist or worsen.   3. Hypertension - Patient to increase dose of clonidine to 0.3mg  night and keep a blood pressure log at home. Will determine need for further medication intervention if reading do not improve on next office visit.   4. Hirsutism - Patient given laboratory requisition form for Cortisol and testosterone. Patient given prescription for nystatin ointment for hair growth. Will call patient with laboratory results and will determine need for further medical intervention at that time.   Summary - Patient received flu shot and annual physical exam today.

## 2016-03-24 LAB — TESTOSTERONE: TESTOSTERONE: 412 ng/dL

## 2016-03-25 ENCOUNTER — Encounter: Payer: Self-pay | Admitting: Physician Assistant

## 2016-03-25 DIAGNOSIS — R7989 Other specified abnormal findings of blood chemistry: Secondary | ICD-10-CM | POA: Insufficient documentation

## 2016-03-26 LAB — CORTISOL, URINE, 24 HOUR

## 2016-03-30 ENCOUNTER — Encounter: Payer: Self-pay | Admitting: Physician Assistant

## 2016-03-31 ENCOUNTER — Other Ambulatory Visit: Payer: Self-pay | Admitting: Physician Assistant

## 2016-03-31 DIAGNOSIS — R7989 Other specified abnormal findings of blood chemistry: Secondary | ICD-10-CM

## 2016-03-31 DIAGNOSIS — L680 Hirsutism: Secondary | ICD-10-CM

## 2016-04-03 ENCOUNTER — Ambulatory Visit (INDEPENDENT_AMBULATORY_CARE_PROVIDER_SITE_OTHER): Payer: Medicare Other

## 2016-04-03 ENCOUNTER — Other Ambulatory Visit: Payer: Self-pay | Admitting: Physician Assistant

## 2016-04-03 DIAGNOSIS — R1909 Other intra-abdominal and pelvic swelling, mass and lump: Secondary | ICD-10-CM

## 2016-04-03 DIAGNOSIS — N200 Calculus of kidney: Secondary | ICD-10-CM | POA: Diagnosis not present

## 2016-04-03 DIAGNOSIS — N2889 Other specified disorders of kidney and ureter: Secondary | ICD-10-CM

## 2016-04-03 DIAGNOSIS — L680 Hirsutism: Secondary | ICD-10-CM | POA: Diagnosis not present

## 2016-04-03 IMAGING — CT CT ABDOMEN W/O CM
2 of 4 series · 13 of 46 positions shown, 15 images · non-contrast
Comparison: None.

CLINICAL DATA: Elevated testosterone.  Hirsutism.

EXAM:
CT ABDOMEN WITHOUT CONTRAST
TECHNIQUE: Multidetector CT imaging of the abdomen was performed following the
standard protocol without IV contrast.

[Series 2: axial st · axial · 0.77mm/px · z∈[+1076,+1256]mm · 10 of 74 slices shown, 12 images]
[im 7/74  soft-tissue]
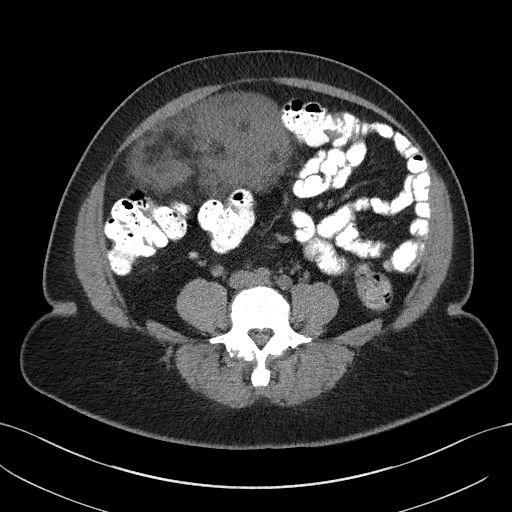
[im 7/74  bone]
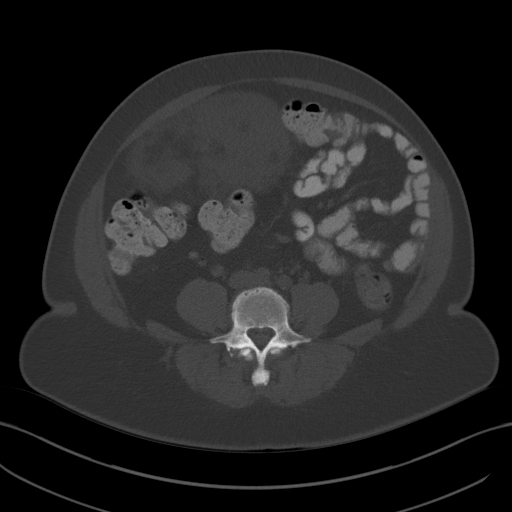
[im 14/74  soft-tissue]
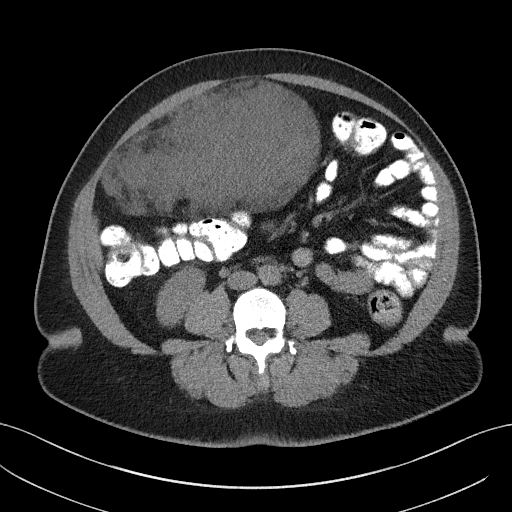
[im 20/74  soft-tissue]
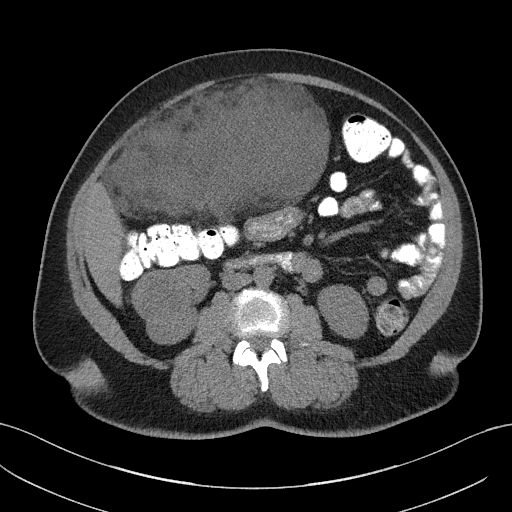
[im 27/74  soft-tissue]
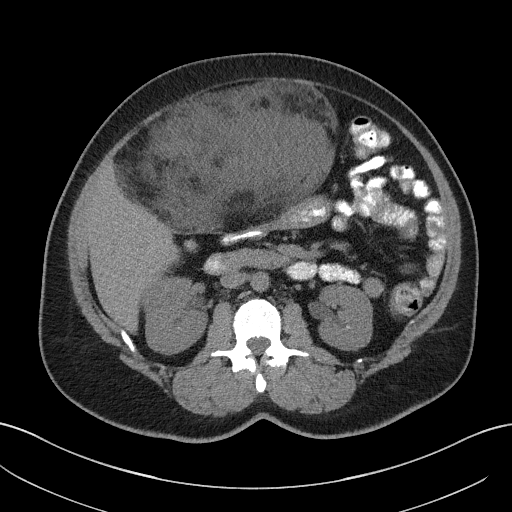
[im 34/74  soft-tissue]
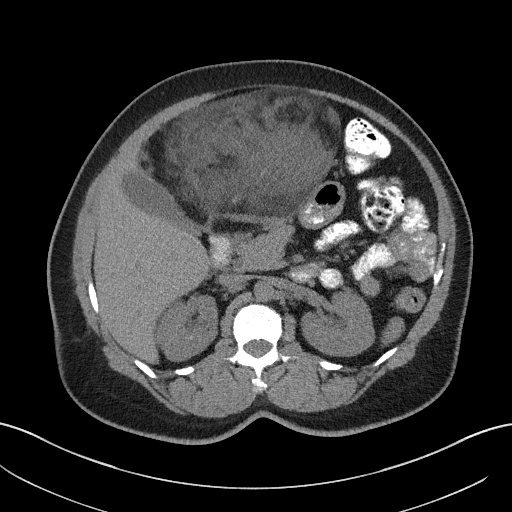
[im 40/74  soft-tissue]
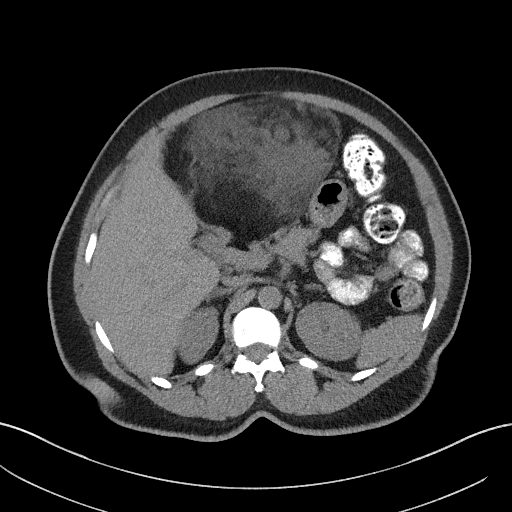
[im 47/74  soft-tissue]
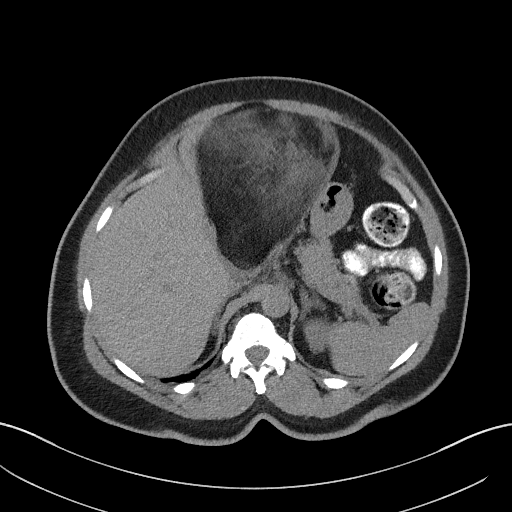
[im 54/74  soft-tissue]
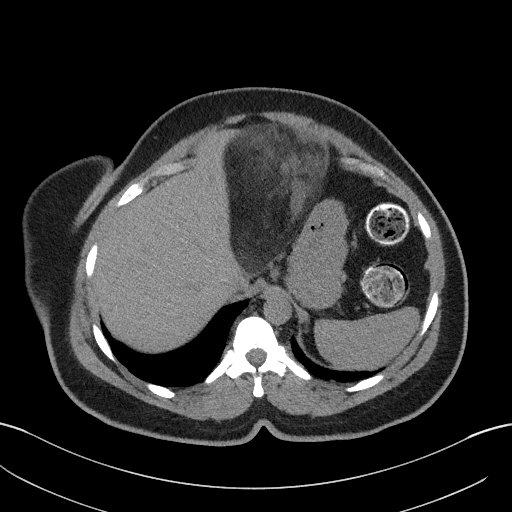
[im 60/74  soft-tissue]
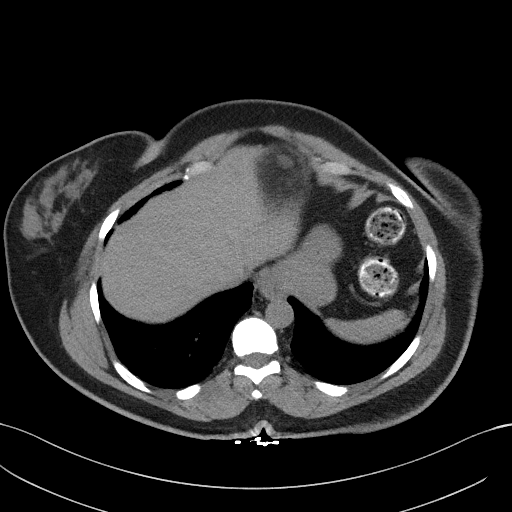
[im 60/74  bone]
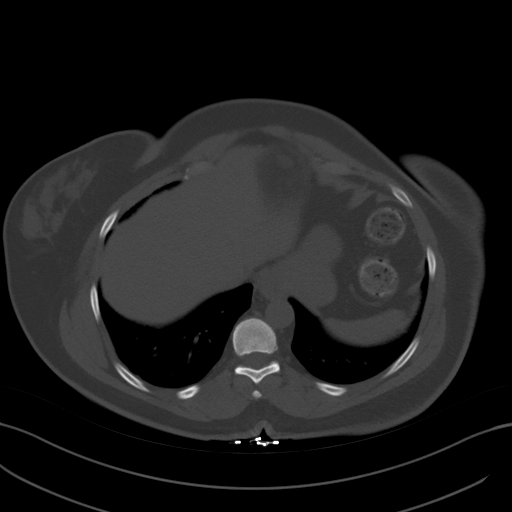
[im 67/74  soft-tissue]
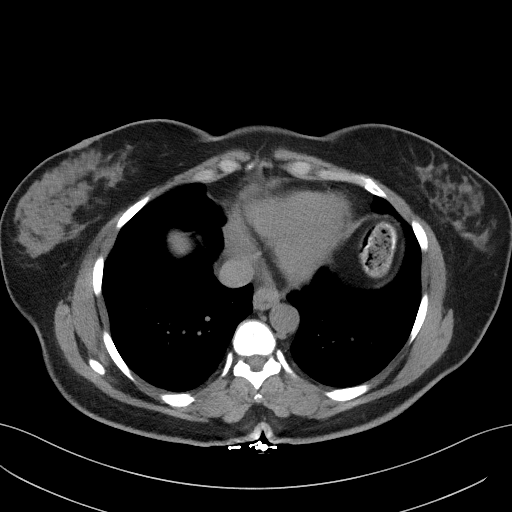

[Series 5: coronal wo · coronal · 0.46mm/px · 3 of 151 slices shown]
[im 51/151  soft-tissue]
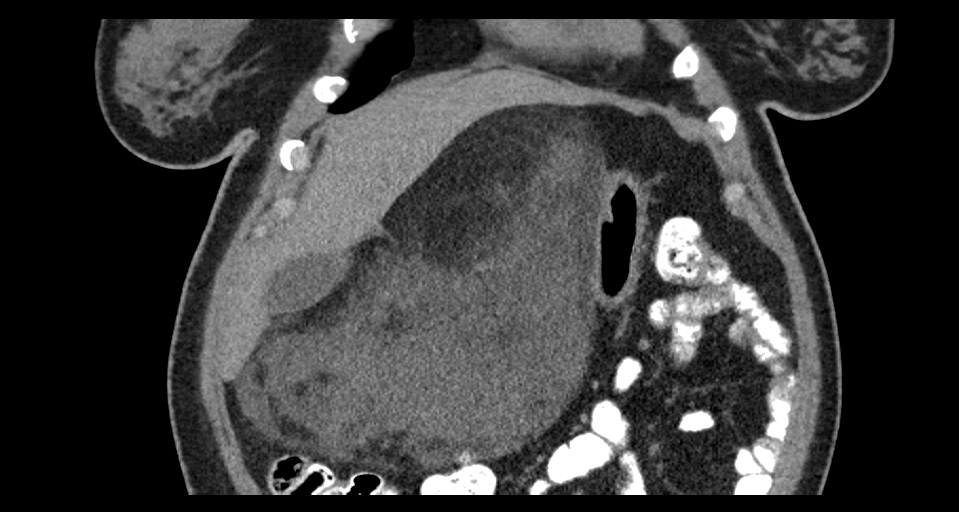
[im 67/151  soft-tissue]
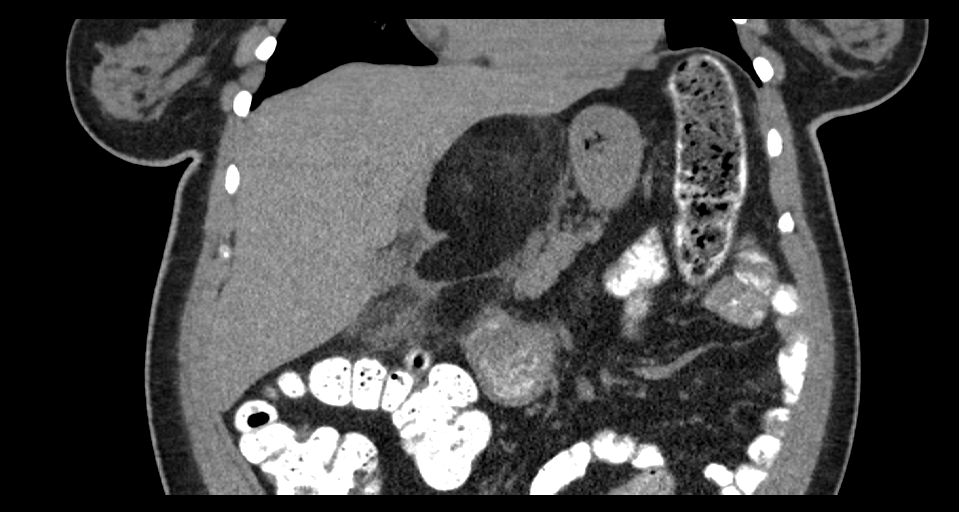
[im 84/151  soft-tissue]
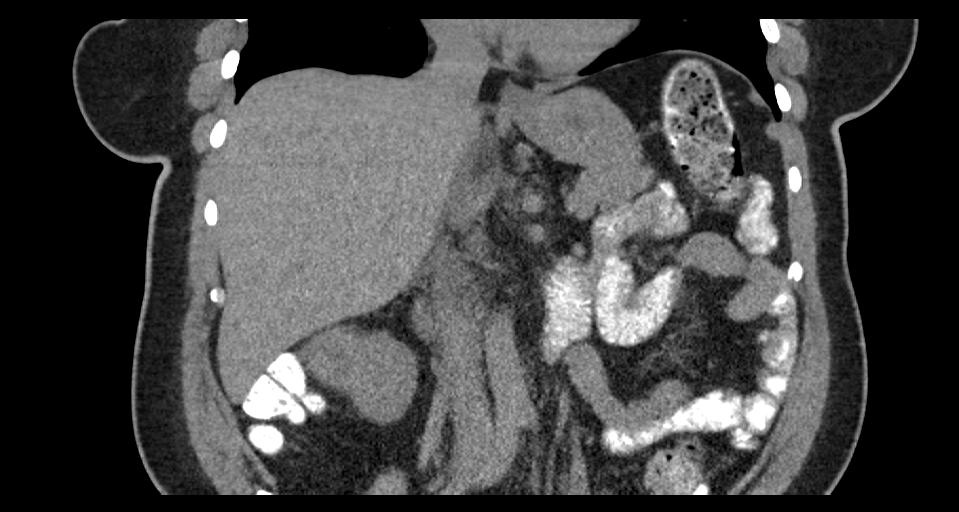

[13 of 46 positions shown; findings below may reference images not displayed]

FINDINGS: Lower chest: Unremarkable

Hepatobiliary: Closely associated with the lateral margin of the
left hepatic lobe and extending in the gastrohepatic ligament
region, there is a 17.5 by 17.7 by 11.2 cm (volume = [6Q] cm^3)
mass tracking down into the omentum, with fatty elements
interspersed with infiltrative soft tissue elements. The mass is
fairly sharply demarcated and displaces the stomach, porta hepatis
structures, and pancreas. This mass abuts the common hepatic artery
and portal vein and also abuts part of the transverse colon. We
image down to the bottom edge of the mass although not quite all the
way through the mass. Also today' s exam does not include the
pelvis.

On today's noncontrast assessment, aside from the displacement of
the liver, no other liver abnormality is identified. The mass is
very closely associated with the capsule the liver.

Pancreas: Part of the pancreas is tangential to the mass. Otherwise
unremarkable.

Spleen: Unremarkable

Adrenals/Urinary Tract: The mass is fairly close to the right
adrenal gland but does not seem to be originating from the adrenal
gland. Left adrenal gland normal.

Bilateral punctate nonobstructive renal calculi. Approximately 4 cm
complex right renal exophytic lesion, concerning for renal mass,
image 52/2.

Stomach/Bowel: The stomach and transverse colon are tangential to an
displaced by the large omental and lesser sac mass.

Vascular/Lymphatic: Right common iliac lymph node 0.8 cm on image
65/2.

Other: On the scout image, there are some large rim calcified
structures in the pelvis including a 3.8 cm structure in the left
anatomic pelvis and a 2.6 cm structure in the right anatomic pelvis.
These could represent calcified fibroids but calcified ovarian
masses are not excluded.

Musculoskeletal: There is foraminal impingement bilaterally at L4-5
due to intervertebral and facet spurring.
IMPRESSION: 1. 1,820 cc mass in the lesser sac and upper omentum on the right,
displacing surrounding structures, with fatty and soft tissue
infiltrative elements. Considerations might include virilizing
germ-cell tumor or liposarcoma based on this appearance.
2. Suspected 4 cm complex lesion of the right mid kidney, possibly a
mass or complex cyst, post-contrast imaging by MRI or renal mass
protocol CT suggested for definitive characterization.
3. On the scout image, the pelvis is included and demonstrates
calcified masses in the anatomic pelvis. Although these could be
fibroids, I can't exclude ovarian tumors, and dedicated
cross-sectional imaging of the pelvis or pelvic sonography is
recommended.
4. Bilateral punctate nonobstructive renal calculi.
5. Bilateral neural foraminal impingement at L4-5 due to
intervertebral and facet spurring.
These results will be called to the ordering clinician or
representative by the Radiologist Assistant, and communication
documented in the PACS or zVision Dashboard.

## 2016-04-05 ENCOUNTER — Encounter: Payer: Self-pay | Admitting: Physician Assistant

## 2016-04-05 DIAGNOSIS — N838 Other noninflammatory disorders of ovary, fallopian tube and broad ligament: Secondary | ICD-10-CM | POA: Insufficient documentation

## 2016-04-05 DIAGNOSIS — L680 Hirsutism: Secondary | ICD-10-CM | POA: Insufficient documentation

## 2016-04-05 DIAGNOSIS — N2889 Other specified disorders of kidney and ureter: Secondary | ICD-10-CM | POA: Insufficient documentation

## 2016-04-05 DIAGNOSIS — K668 Other specified disorders of peritoneum: Secondary | ICD-10-CM | POA: Insufficient documentation

## 2016-04-06 ENCOUNTER — Telehealth: Payer: Self-pay | Admitting: Family Medicine

## 2016-04-06 LAB — CORTISOL, URINE, 24 HOUR
Cortisol (Ur), Free: 5.7 mcg/24 h (ref 4.0–50.0)
RESULTS RECEIVED: 1.36 g/(24.h) (ref 0.63–2.50)

## 2016-04-07 ENCOUNTER — Ambulatory Visit (INDEPENDENT_AMBULATORY_CARE_PROVIDER_SITE_OTHER): Payer: Medicare Other | Admitting: Family Medicine

## 2016-04-07 ENCOUNTER — Encounter: Payer: Self-pay | Admitting: Family Medicine

## 2016-04-07 ENCOUNTER — Other Ambulatory Visit: Payer: Self-pay | Admitting: Family Medicine

## 2016-04-07 VITALS — BP 151/73 | HR 74 | Wt 165.0 lb

## 2016-04-07 DIAGNOSIS — R1906 Epigastric swelling, mass or lump: Secondary | ICD-10-CM

## 2016-04-07 DIAGNOSIS — R7989 Other specified abnormal findings of blood chemistry: Secondary | ICD-10-CM

## 2016-04-07 DIAGNOSIS — N2889 Other specified disorders of kidney and ureter: Secondary | ICD-10-CM

## 2016-04-07 DIAGNOSIS — K668 Other specified disorders of peritoneum: Secondary | ICD-10-CM

## 2016-04-07 DIAGNOSIS — N838 Other noninflammatory disorders of ovary, fallopian tube and broad ligament: Secondary | ICD-10-CM

## 2016-04-07 DIAGNOSIS — E876 Hypokalemia: Secondary | ICD-10-CM

## 2016-04-07 DIAGNOSIS — E349 Endocrine disorder, unspecified: Secondary | ICD-10-CM

## 2016-04-07 DIAGNOSIS — N839 Noninflammatory disorder of ovary, fallopian tube and broad ligament, unspecified: Secondary | ICD-10-CM

## 2016-04-07 DIAGNOSIS — R1900 Intra-abdominal and pelvic swelling, mass and lump, unspecified site: Secondary | ICD-10-CM

## 2016-04-07 LAB — BASIC METABOLIC PANEL WITH GFR
BUN: 6 mg/dL — AB (ref 7–25)
CHLORIDE: 101 mmol/L (ref 98–110)
CO2: 26 mmol/L (ref 20–31)
CREATININE: 0.88 mg/dL (ref 0.50–1.05)
Calcium: 9.1 mg/dL (ref 8.6–10.4)
GFR, EST NON AFRICAN AMERICAN: 73 mL/min (ref >=60)
GFR, Est African American: 84 mL/min (ref >=60)
GLUCOSE: 90 mg/dL (ref 65–99)
Potassium: 3.2 mmol/L — ABNORMAL LOW (ref 3.5–5.3)
SODIUM: 138 mmol/L (ref 135–146)

## 2016-04-07 NOTE — Progress Notes (Signed)
   Subjective:    Patient ID: Bonnie Torres, female    DOB: 10-06-1955, 60 y.o.   MRN: [id number]  HPI  60 year old female comes in today to discuss her recent CT results. She come in for a physical.  She complained that she was getting excess hair growth and multiple areas so they did lab work including testosterone which showed an elevated level. This led to a CT scan of the adrenals. Unfortunately a showed a 17 x 17 x 11 cm mass in the abdomen right along the edge of the liver. It also showed an exophytic renal mass as well as 2 calcified lesions which could either be fibroids or calcified lesions on the ovaries. I did call and speak to the radiologist this morning he recommended further evaluation with CT with contrast of the abdomen and pelvis and chest. Lab Results  Component Value Date   TESTOSTERONE 412 03/23/2016   She does report that her sister about a year ago was diagnosed uterine cancer and a paternal cousin was also diagnosed with uterine cancer around the same time. She also has a paternal aunt to had stomach cancer. No breast cancer or ovarian cancer or colon cancer in the family.  Review of Systems She said eyes any abdominal pain or bloating. She said she had noticed that she gained some weight in the abdomen. She's not having any bowel movement problems.    Objective:   Physical Exam  Constitutional: She is oriented to person, place, and time. She appears well-developed and well-nourished.  HENT:  Head: Normocephalic and atraumatic.  Eyes: Conjunctivae and EOM are normal.  Cardiovascular: Normal rate.   Pulmonary/Chest: Effort normal.  Neurological: She is alert and oriented to person, place, and time.  Skin: Skin is dry. No pallor.  Psychiatric: She has a normal mood and affect. Her behavior is normal.  Vitals reviewed.         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 I did encourage her to  tell a family member as she was not able to bring anyone with her.I did seek with radiology this morning and they recommended CT of the abdomen for this and chest. We'll get that ordered today. She will go for a stat creatinine so that we can get her scheduled later this week. We are Artie Arnell Sieving rise the images with her insurance company and can move forward. Did encourage her to call the office if she has any questions or concerns.

## 2016-04-08 ENCOUNTER — Ambulatory Visit (HOSPITAL_BASED_OUTPATIENT_CLINIC_OR_DEPARTMENT_OTHER): Payer: Medicare Other

## 2016-04-08 ENCOUNTER — Ambulatory Visit (HOSPITAL_BASED_OUTPATIENT_CLINIC_OR_DEPARTMENT_OTHER)
Admission: RE | Admit: 2016-04-08 | Discharge: 2016-04-08 | Disposition: A | Discharge status: Home / Self Care | Payer: Medicare Other | Source: Ambulatory Visit | Attending: Family Medicine | Admitting: Family Medicine

## 2016-04-08 ENCOUNTER — Encounter (HOSPITAL_BASED_OUTPATIENT_CLINIC_OR_DEPARTMENT_OTHER): Payer: Self-pay

## 2016-04-08 ENCOUNTER — Ambulatory Visit (HOSPITAL_BASED_OUTPATIENT_CLINIC_OR_DEPARTMENT_OTHER): Admission: RE | Admit: 2016-04-08 | Payer: Medicare Other | Source: Ambulatory Visit

## 2016-04-08 DIAGNOSIS — R1906 Epigastric swelling, mass or lump: Secondary | ICD-10-CM | POA: Diagnosis not present

## 2016-04-08 DIAGNOSIS — R1900 Intra-abdominal and pelvic swelling, mass and lump, unspecified site: Secondary | ICD-10-CM | POA: Diagnosis not present

## 2016-04-08 DIAGNOSIS — N2889 Other specified disorders of kidney and ureter: Secondary | ICD-10-CM | POA: Insufficient documentation

## 2016-04-08 DIAGNOSIS — R918 Other nonspecific abnormal finding of lung field: Secondary | ICD-10-CM | POA: Diagnosis not present

## 2016-04-08 IMAGING — CT CT CHEST W/ CM
2 of 6 series · 12 of 36 positions shown, 15 images · IV contrast (APPLIED)
Comparison: CT abdomen [DATE]

CLINICAL DATA: Abnormal noncontrast CT. Epigastric mass, renal
mass, pelvic mass.

EXAM:
CT CHEST, ABDOMEN, AND PELVIS WITH CONTRAST
TECHNIQUE: Multidetector CT imaging of the chest, abdomen and pelvis was
performed following the standard protocol during bolus
administration of intravenous contrast.
CONTRAST:  100mL [38] IOPAMIDOL ([38]) INJECTION 61%

[Series 2: cap with 2 · axial · 0.76mm/px · z∈[-543,-83]mm · 9 of 116 slices shown, 12 images]
[im 12/116  mediastinal]
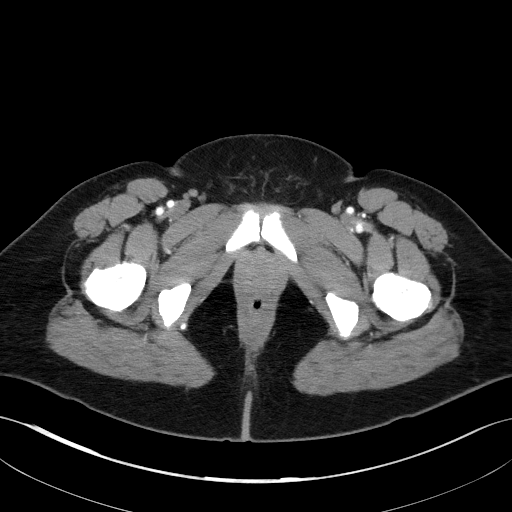
[im 12/116  lung]
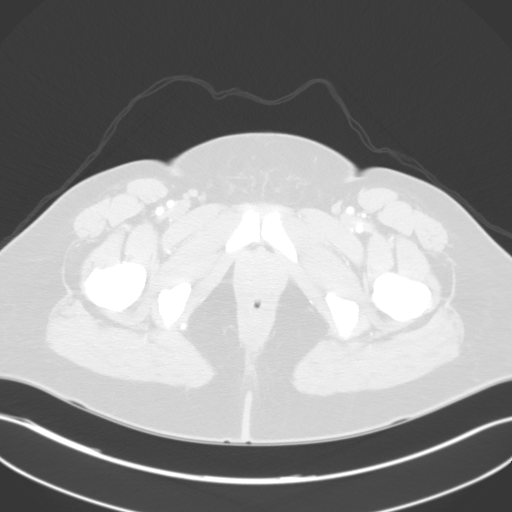
[im 24/116  lung]
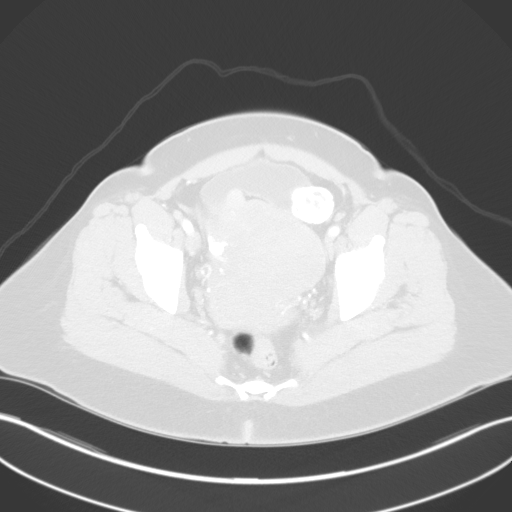
[im 35/116  lung]
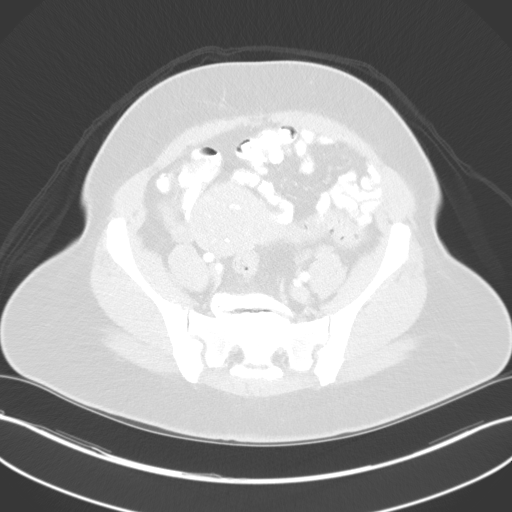
[im 47/116  lung]
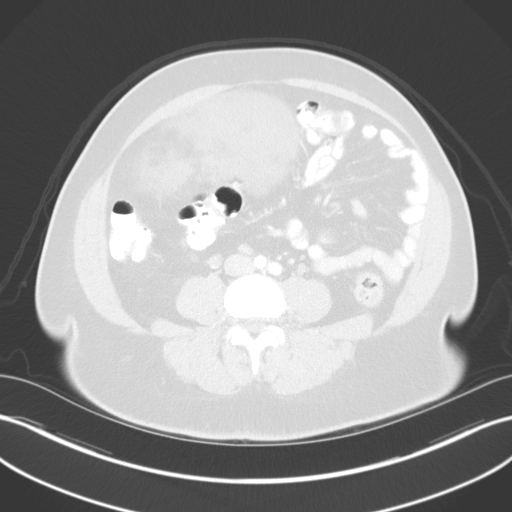
[im 58/116  mediastinal]
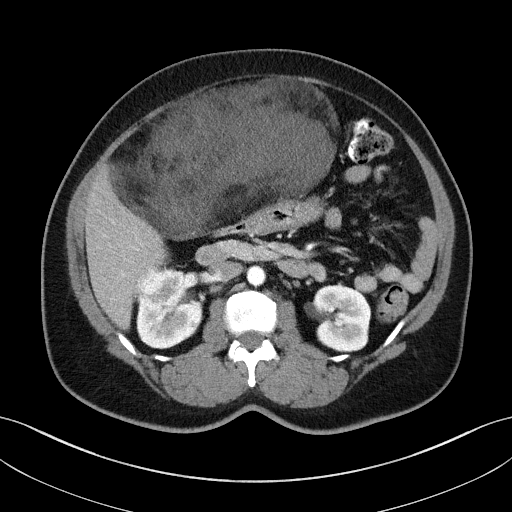
[im 58/116  lung]
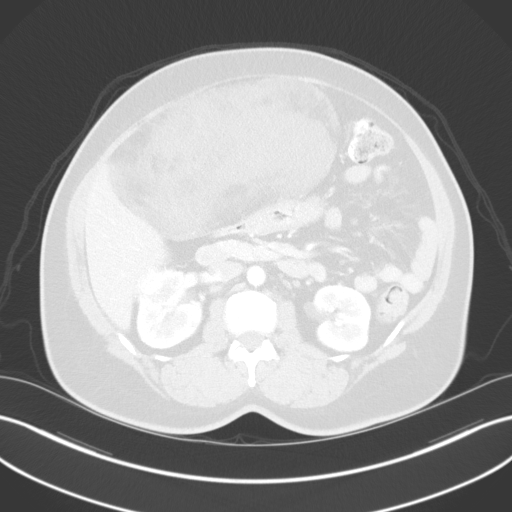
[im 70/116  lung]
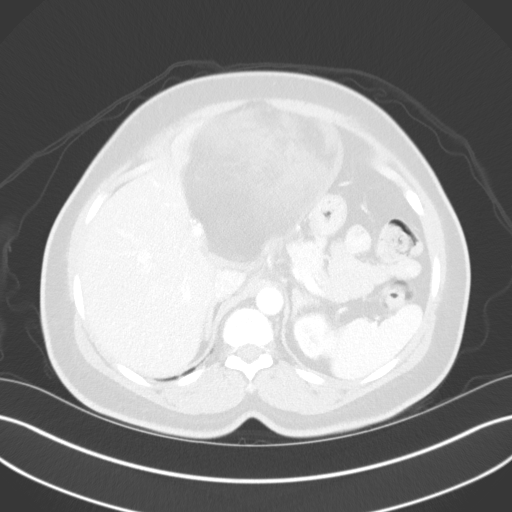
[im 81/116  lung]
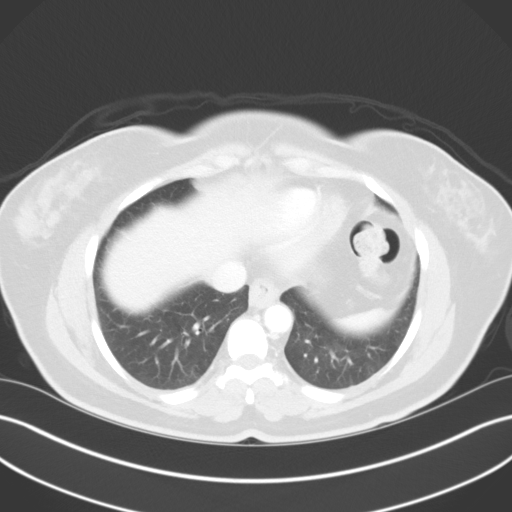
[im 93/116  lung]
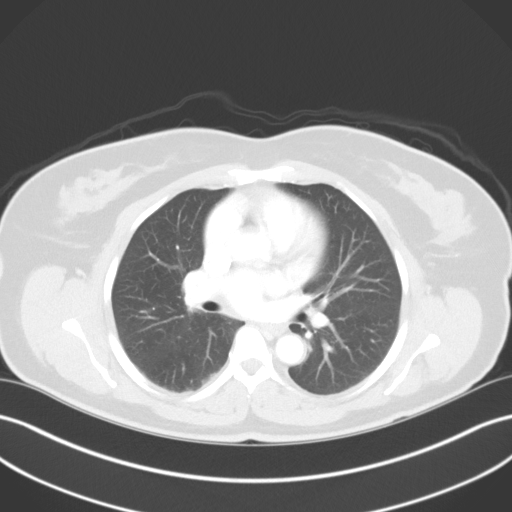
[im 104/116  mediastinal]
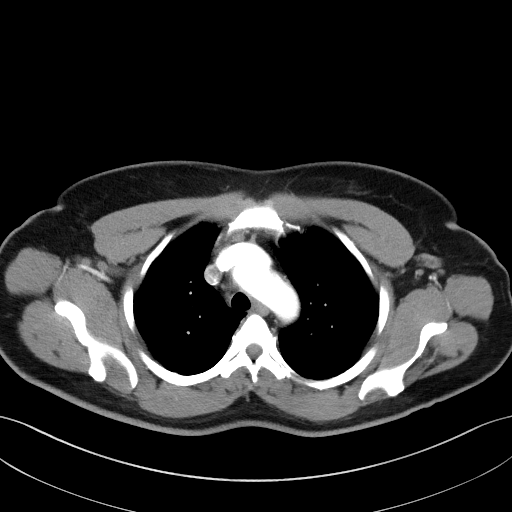
[im 104/116  lung]
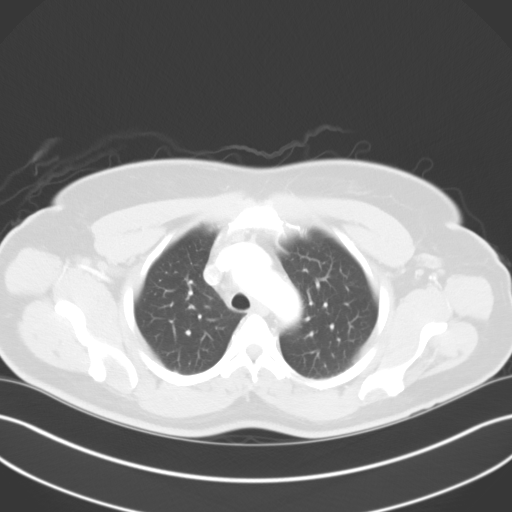

[Series 4: coronals · coronal · 0.82mm/px · 3 of 163 slices shown]
[im 33/163  lung]
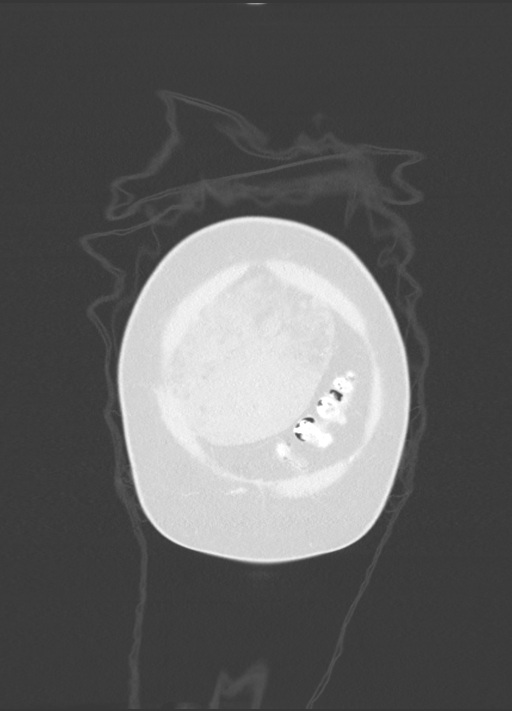
[im 65/163  lung]
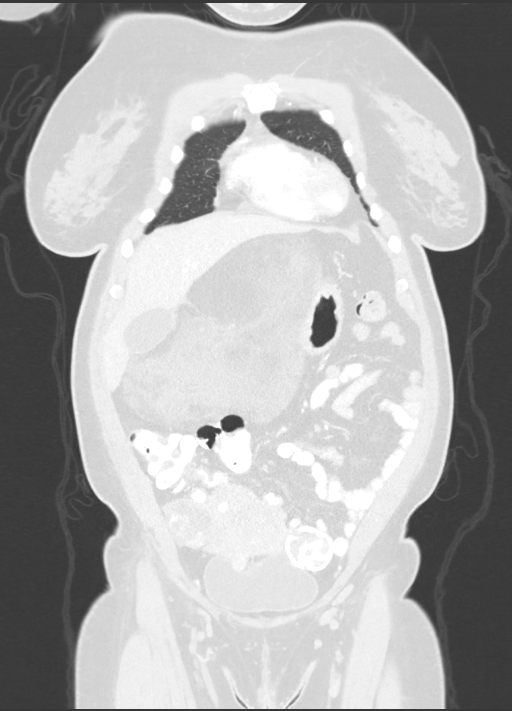
[im 98/163  lung]
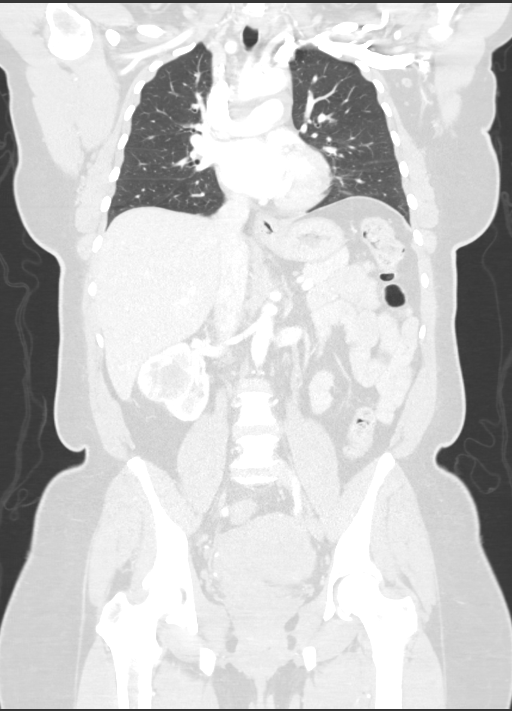

[12 of 36 positions shown; findings below may reference images not displayed]

FINDINGS: CT CHEST FINDINGS

Cardiovascular: Heart is normal size. Aorta is normal caliber.

Mediastinum/Nodes: No mediastinal, hilar, or axillary adenopathy.
Several small nodules in the thyroid lobes bilaterally, the largest
on the left measuring 11 mm.

Lungs/Pleura: Lungs are clear. No focal airspace opacities or
suspicious nodules. No effusions.

Musculoskeletal: No acute bony abnormality or focal bone lesion.

CT ABDOMEN PELVIS FINDINGS

Hepatobiliary: Mass-effect on the left lobe of the liver remain
large upper abdominal fat and soft tissue mass, see below for
further discussion. No focal hepatic abnormality. Gallbladder
unremarkable. No biliary ductal dilatation.

Pancreas: No focal abnormality or ductal dilatation.

Spleen: No focal abnormality.  Normal size.

Adrenals/Urinary Tract: 4.6 x 4.2 cm enhancing mass within the
midpole of the right kidney concerning for renal cell carcinoma.
Small cysts in the left kidney. No hydronephrosis. Adrenal glands
and urinary bladder are unremarkable. Renal veins are patent. No
surrounding adenopathy.

Stomach/Bowel: Stomach, large and small bowel grossly unremarkable.

Vascular/Lymphatic: No evidence of aneurysm or adenopathy.

Reproductive: Enlarged uterus with numerous calcified and
noncalcified fibroids. There is a rim calcified exophytic subserosal
fibroid on the left measuring up to 3.8 cm corresponding to the left
pelvic calcifications seen on prior scout image. Multiple calcified
fibroid centrally. There is a mixed density predominantly solid,
partially enhancing mass within the right adnexal region measuring
5.5 x 3.7 cm. It is difficult to determine if this is a mass within
the right ovary or exophytic/pedunculated fibroid.

Other: No free fluid or free air. Large mixed soft tissue and fatty
density mass noted in the right abdomen with mass-effect upon the
left lobe of the liver and the stomach. This measures 17.3 x 10.2 x
17.5 cm. A few scattered central calcifications.

Musculoskeletal: Degenerative disc disease and facet disease in the
lower lumbar spine. Bilateral L5 pars defects with grade 1
anterolisthesis of L5 on S1.
IMPRESSION: Multiple separate and unique mass lesions in the abdomen and pelvis
including:

Large mixed soft tissue and fatty mass in the right abdomen
measuring up to 17.5 cm, concerning for liposarcoma.

Enhancing solid mass in the midpole of the right kidney measuring up
to 4.6 cm concerning for renal cell carcinoma.

Mixed density partially enhancing mass in the right adnexal region.
While this could reflect a pedunculated fibroid (there are any
calcified and noncalcified fibroids within the enlarged uterus), I
cannot exclude a right ovarian mass/neoplasm.

No acute or significant findings in the chest.

## 2016-04-08 MED ORDER — IOPAMIDOL (ISOVUE-300) INJECTION 61%
100.0000 mL | Freq: Once | INTRAVENOUS | Status: AC | PRN
  Administered 2016-04-08: 100 mL via INTRAVENOUS

## 2016-04-08 NOTE — Addendum Note (Signed)
Addended by: Teddy Spike on: 04/08/2016 02:36 PM   Modules accepted: Orders

## 2016-04-13 ENCOUNTER — Encounter: Payer: Self-pay | Admitting: Hematology & Oncology

## 2016-04-13 ENCOUNTER — Telehealth: Payer: Self-pay | Admitting: *Deleted

## 2016-04-13 ENCOUNTER — Ambulatory Visit (HOSPITAL_BASED_OUTPATIENT_CLINIC_OR_DEPARTMENT_OTHER): Payer: Medicare Other | Admitting: Hematology & Oncology

## 2016-04-13 ENCOUNTER — Other Ambulatory Visit (HOSPITAL_BASED_OUTPATIENT_CLINIC_OR_DEPARTMENT_OTHER): Payer: Medicare Other

## 2016-04-13 ENCOUNTER — Ambulatory Visit: Payer: Medicare Other

## 2016-04-13 VITALS — BP 152/81 | HR 107 | Temp 98.6°F | Resp 16 | Ht 59.0 in | Wt 162.0 lb

## 2016-04-13 DIAGNOSIS — R935 Abnormal findings on diagnostic imaging of other abdominal regions, including retroperitoneum: Secondary | ICD-10-CM | POA: Diagnosis not present

## 2016-04-13 DIAGNOSIS — R1901 Right upper quadrant abdominal swelling, mass and lump: Secondary | ICD-10-CM

## 2016-04-13 DIAGNOSIS — E876 Hypokalemia: Secondary | ICD-10-CM | POA: Diagnosis not present

## 2016-04-13 DIAGNOSIS — R1900 Intra-abdominal and pelvic swelling, mass and lump, unspecified site: Secondary | ICD-10-CM | POA: Diagnosis not present

## 2016-04-13 LAB — CBC WITH DIFFERENTIAL (CANCER CENTER ONLY)
BASO#: 0 10*3/uL (ref 0.0–0.2)
BASO%: 0.5 % (ref 0.0–2.0)
EOS%: 2.1 % (ref 0.0–7.0)
Eosinophils Absolute: 0.1 10*3/uL (ref 0.0–0.5)
HCT: 41.4 % (ref 34.8–46.6)
HGB: 15.2 g/dL (ref 11.6–15.9)
LYMPH#: 3.2 10*3/uL (ref 0.9–3.3)
LYMPH%: 50.2 % — AB (ref 14.0–48.0)
MCH: 27.3 pg (ref 26.0–34.0)
MCHC: 36.7 g/dL — ABNORMAL HIGH (ref 32.0–36.0)
MCV: 74 fL — ABNORMAL LOW (ref 81–101)
MONO#: 0.4 10*3/uL (ref 0.1–0.9)
MONO%: 6.5 % (ref 0.0–13.0)
NEUT%: 40.7 % (ref 39.6–80.0)
NEUTROS ABS: 2.6 10*3/uL (ref 1.5–6.5)
PLATELETS: 275 10*3/uL (ref 145–400)
RBC: 5.57 10*6/uL — AB (ref 3.70–5.32)
RDW: 13.2 % (ref 11.1–15.7)
WBC: 6.3 10*3/uL (ref 3.9–10.0)

## 2016-04-13 LAB — CMP (CANCER CENTER ONLY)
ALK PHOS: 58 U/L (ref 26–84)
ALT: 21 U/L (ref 10–47)
AST: 20 U/L (ref 11–38)
Albumin: 4.4 g/dL (ref 3.3–5.5)
BILIRUBIN TOTAL: 1 mg/dL (ref 0.20–1.60)
BUN: 7 mg/dL (ref 7–22)
CHLORIDE: 101 meq/L (ref 98–108)
CO2: 27 mEq/L (ref 18–33)
CREATININE: 1 mg/dL (ref 0.6–1.2)
Calcium: 9.7 mg/dL (ref 8.0–10.3)
Glucose, Bld: 102 mg/dL (ref 73–118)
Potassium: 2.6 mEq/L — CL (ref 3.3–4.7)
SODIUM: 133 meq/L (ref 128–145)
TOTAL PROTEIN: 8.2 g/dL — AB (ref 6.4–8.1)

## 2016-04-13 LAB — TECHNOLOGIST REVIEW CHCC SATELLITE

## 2016-04-13 MED ORDER — POTASSIUM CHLORIDE CRYS ER 20 MEQ PO TBCR: 20.0000 meq | EXTENDED_RELEASE_TABLET | Freq: Every day | ORAL | 2 refills | Status: DC

## 2016-04-13 NOTE — Telephone Encounter (Signed)
Lab entered

## 2016-04-13 NOTE — Progress Notes (Signed)
Referral MD  Reason for Referral: Anterior abdominal mass and right renal mass   Chief Complaint  Patient presents with  . Other    New Patient  My doctor found some tumors on my CAT scan.  HPI: Bonnie Torres is a very charming 60 year old African-American female. She really does not have much of a medical history. She has complained of hirsutism. Her family doctor, being incredibly thorough, checked her for testosterone and found that her testosterone level was incredibly high. Her testosterone level was 412.  This led to a CAT scan being done. The CAT scan was done on October 11. Shock enough, the CAT scan showed a large anterior abdominal mass. This measured 17.3 x 10.2 x 17.5 cm. There were some scattered central calcifications. This was a large mixed soft tissue and fatty mass. This was located next to the liver. Also noted was a 4.6 x 4.2 and 2 mass in the midpole of the right kidney.  In the right adnexal region also was a mass measuring 5.5 x 3.7 cm. It is unclear whether this is the right ovary or a fibroid. There was no obvious ascites. There was no intrahepatic lesions. Spleen looked okay. She had a normal stomach and large/small bowel.  She has not had any problems with her bowels or bladder. She's had no nausea or vomiting. His been no weight loss or weight gain. She's had no rashes. She's had no leg swelling. She's had no cough or shortness of breath.  She does not smoke. She does not drink.  She currently is on disability. I don't think there is any obvious occupational exposures.  There is no obvious family history of malignancy.  She has not noted any palpable lymph nodes. She's had no dysphagia or odynophagia. There is no headache. She's had no double vision or blurred vision.  Overall, I see that her performance status is ECOG 1.    Past Medical History:  Diagnosis Date  . Hyperlipidemia   . Hypertension   :  Past Surgical History:  Procedure Laterality Date  .  SHOULDER SURGERY Right 2010  :   Current Outpatient Prescriptions:  .  allopurinol (ZYLOPRIM) 100 MG tablet, Take 1 tablet by mouth  daily, Disp: 90 tablet, Rfl: 0 .  cloNIDine (CATAPRES) 0.3 MG tablet, Take 1 tablet (0.3 mg total) by mouth 2 (two) times daily., Disp: 90 tablet, Rfl: 1 .  colchicine 0.6 MG tablet, Take 1 tablet by mouth  daily, Disp: 90 tablet, Rfl: 0 .  meloxicam (MOBIC) 15 MG tablet, Take 1 tablet (15 mg total) by mouth daily. As needed for back pain, Disp: 30 tablet, Rfl: 1 .  nystatin ointment (MYCOSTATIN), Apply 1 application topically 2 (two) times daily., Disp: 60 g, Rfl: 2 .  Olmesartan-Amlodipine-HCTZ (TRIBENZOR) 20-5-12.5 MG TABS, Take 1 tablet by mouth daily., Disp: 90 tablet, Rfl: 2 .  pravastatin (PRAVACHOL) 40 MG tablet, Take one tablet by mouth daily., Disp: 90 tablet, Rfl: 4 .  atenolol (TENORMIN) 100 MG tablet, TAKE 1 TABLET(100 MG) BY MOUTH DAILY (Patient not taking: Reported on 04/13/2016), Disp: 90 tablet, Rfl: 2 .  potassium chloride SA (K-DUR,KLOR-CON) 20 MEQ tablet, Take 1 tablet (20 mEq total) by mouth daily., Disp: 30 tablet, Rfl: 2:  :  No Known Allergies:  Family History  Problem Relation Age of Onset  . Heart attack Paternal Aunt   . Hypertension Mother   . Diabetes Maternal Aunt   . Cancer Paternal Aunt  stomach  :  Social History   Social History  . Marital status: Widowed    Spouse name: N/A  . Number of children: N/A  . Years of education: N/A   Occupational History  . Not on file.   Social History Main Topics  . Smoking status: Never Smoker  . Smokeless tobacco: Never Used  . Alcohol use No  . Drug use: No  . Sexual activity: Not Currently   Other Topics Concern  . Not on file   Social History Narrative  . No narrative on file  :  Pertinent items are noted in HPI.  Exam: @IPVITALS @  petite but well-nourished African-American female in no obvious distress. Vital signs show a temperature of 98.6. Pulse 107.  Blood pressure 150/81. Weight is 162 pounds. Head and neck exam shows no ocular or oral lesions. She has no scleral icterus. She does have some  hirsutism on her face. There is no adenopathy in her neck. Thyroid is nonpalpable. Lungs are clear to percussion and auscultation bilaterally. Cardiac exam regular in rhythm with no murmurs, rubs or bruits. Abdomen is soft. She has good bowel sounds. She is mildly obese. She has no fluid wave. There is no palpable liver or spleen tip. Back exam shows no tenderness over the spine, ribs or hips. Extremities shows no clubbing, cyanosis or edema. Neurological exam shows no focal neurological deficits. Skin exam shows no rashes, ecchymoses or petechia.    Recent Labs  04/13/16 1355  WBC 6.3  HGB 15.2  HCT 41.4  PLT 275    Recent Labs  04/13/16 1355  NA 133  K 2.6*  CL 101  CO2 27  GLUCOSE 102  BUN 7  CREATININE 1.0  CALCIUM 9.7    Blood smear review:  None   Pathology: None     Assessment and Plan:  Bonnie Torres is a very nice 60 year old African-American female. She obviously has an underlying tumor. The question whether not this is malignant. I suspect that she may actually have 2 different processes. She has the mass in the right kidney. She also has an adnexal mass. She has this huge anterior abdominal mass.   This is clearly a situation that surgery is necessary. I would not start doing biopsies at this point. I am worried about the possibility of her having a sarcoma. I would not think that a percutaneous biopsy would be appropriate for a sarcoma.  It is possible that what we are looking at a situation that is best treated at a surgical center at one of our local academic [place]. However, I'm going to speak with one of our outstanding surgeons in town. I'll like to have him take a look at her.  I did send off some tumor markers. We'll see if they are positive.  Again, she has quite a bit of by him of tumor. The anterior  abdominal mass looks like it might be encapsulated that it could be taken out in 1 piece.  Her potassium is quite low. As such, I wonder if she may have some type of adrenal tumor that could be secreting aldosterone.  This is a truly fascinating case. We will have to see what the surgeons can do.  We will see what her labs show. I will put her on some potassium because of her hypokalemia.  I spent about an hour with her. Again this is truly complicated and fascinating.  We will plan to get her back after she has  seen surgery and hopefully after she has actually had surgery.

## 2016-04-13 NOTE — Telephone Encounter (Signed)
Critical Value Potassium 2.6 Dr Marin Olp aware. No orders at this time.

## 2016-04-14 LAB — CA 125
CA 125: 25 U/mL (ref <35)
Cancer Antigen (CA) 125: 23.8 U/mL (ref 0.0–38.1)

## 2016-04-14 LAB — LACTATE DEHYDROGENASE: LDH: 217 U/L (ref 125–245)

## 2016-04-14 LAB — BETA HCG QUANT (REF LAB)

## 2016-04-14 LAB — AFP TUMOR MARKER: AFP, Serum, Tumor Marker: 4.2 ng/mL (ref 0.0–8.3)

## 2016-04-15 DIAGNOSIS — R7989 Other specified abnormal findings of blood chemistry: Secondary | ICD-10-CM | POA: Diagnosis not present

## 2016-04-15 DIAGNOSIS — R1909 Other intra-abdominal and pelvic swelling, mass and lump: Secondary | ICD-10-CM | POA: Diagnosis not present

## 2016-04-15 DIAGNOSIS — C641 Malignant neoplasm of right kidney, except renal pelvis: Secondary | ICD-10-CM | POA: Diagnosis not present

## 2016-04-17 ENCOUNTER — Telehealth: Payer: Self-pay | Admitting: *Deleted

## 2016-04-17 MED ORDER — METOPROLOL TARTRATE 100 MG PO TABS: 100.0000 mg | ORAL_TABLET | Freq: Two times a day (BID) | ORAL | 0 refills | Status: DC

## 2016-04-17 NOTE — Telephone Encounter (Signed)
Pt left vm stating that the pharmacy has been out of atenolol and she has been without it for a few days.  She takes 100mg  qd.  Is there another beta blocker we can switch her to in the meantime? Please advise.

## 2016-04-17 NOTE — Telephone Encounter (Signed)
Ok will change to metoprolol. Sent to [facility].

## 2016-04-21 ENCOUNTER — Ambulatory Visit: Payer: Medicare Other | Admitting: General Surgery

## 2016-04-21 ENCOUNTER — Ambulatory Visit (HOSPITAL_BASED_OUTPATIENT_CLINIC_OR_DEPARTMENT_OTHER): Payer: Medicare Other | Admitting: General Surgery

## 2016-04-21 ENCOUNTER — Encounter: Payer: Self-pay | Admitting: General Surgery

## 2016-04-21 VITALS — BP 143/83 | HR 76 | Temp 98.2°F | Resp 18 | Ht 59.0 in | Wt 166.6 lb

## 2016-04-21 DIAGNOSIS — R7989 Other specified abnormal findings of blood chemistry: Secondary | ICD-10-CM

## 2016-04-21 DIAGNOSIS — M7989 Other specified soft tissue disorders: Secondary | ICD-10-CM

## 2016-04-21 DIAGNOSIS — N852 Hypertrophy of uterus: Secondary | ICD-10-CM

## 2016-04-21 DIAGNOSIS — D259 Leiomyoma of uterus, unspecified: Secondary | ICD-10-CM | POA: Diagnosis not present

## 2016-04-21 DIAGNOSIS — R1909 Other intra-abdominal and pelvic swelling, mass and lump: Secondary | ICD-10-CM

## 2016-04-21 DIAGNOSIS — L680 Hirsutism: Secondary | ICD-10-CM

## 2016-04-21 NOTE — Progress Notes (Signed)
[place] NOTE  Patient Care Team: Donella Stade, PA-C as PCP - General (Family Medicine)  Zetta Bills, MD (General Surgery) Everitt Amber, MD (Gynecologic Surgical Oncology) Raynelle Bring, MD (Urology)  CHIEF COMPLAINTS/PURPOSE OF CONSULTATION:    HISTORY OF PRESENTING ILLNESS:  I had the pleasure of meeting with Bonnie Torres who presents with a one-year history of developing hair on her face which then over the last year his progress to her chest and back. She denies any other symptoms including, fever, chills, voice change, abnormal bleeding, abdominal pain, abdominal cramping or any GI symptoms. She reports she had menopause in 2012 and prior to that had had an uneventful gynecologic history with no gynecologic procedures or diagnoses in the past. She specifically denies being around any mid and using testosterone replacement chills or products. She is G1, P1, A0 and specifically denies any history of endometriosis or fibroids. She presented to her primary care physician approximately 2 weeks prior with a complaint of hirsutism. At that time laboratory studies and a CT scan was ordered. This prompted a follow-up scan and a referral to Dr. Burney Gauze after the identification of intra-abdominal masses. Dr. Marin Olp (medical oncology) ordered laboratory studies which were only remarkable for an elevated testosterone level above 400 (upper limits of normal for a female are 32). Dr. Marin Olp referred her to Dr. Fanny Skates who reviewed the case and contacted [place] for further evaluation and treatment. Today the patient reports her appetite is good, she has no specific abdominal complaints.  I reviewed her records extensively and collaborated the history with the patient.  SUMMARY OF ONCOLOGIC HISTORY:  No history exists.    MEDICAL HISTORY:  Past Medical History:  Diagnosis Date  . Hyperlipidemia   . Hypertension     SURGICAL HISTORY: Past Surgical History:   Procedure Laterality Date  . SHOULDER SURGERY Right 2010  . TOE SURGERY Right Hammer Toe   2004    SOCIAL HISTORY: She denies smoking or alcohol abuse.  FAMILY HISTORY: Family History  Problem Relation Age of Onset  . Heart attack Paternal Aunt   . Hypertension Mother   . Diabetes Maternal Aunt   . Cancer Paternal Aunt     stomach  . Cancer Sister     ALLERGIES:  has No Known Allergies.  MEDICATIONS:  Current Outpatient Prescriptions  Medication Sig Dispense Refill  . allopurinol (ZYLOPRIM) 100 MG tablet Take 1 tablet by mouth  daily 90 tablet 0  . cloNIDine (CATAPRES) 0.3 MG tablet Take 1 tablet (0.3 mg total) by mouth 2 (two) times daily. (Patient taking differently: Take 0.3 mg by mouth at bedtime. ) 90 tablet 1  . colchicine 0.6 MG tablet Take 1 tablet by mouth  daily 90 tablet 0  . meloxicam (MOBIC) 15 MG tablet Take 1 tablet (15 mg total) by mouth daily. As needed for back pain 30 tablet 1  . metoprolol (LOPRESSOR) 100 MG tablet Take 1 tablet (100 mg total) by mouth 2 (two) times daily. 60 tablet 0  . nystatin ointment (MYCOSTATIN) Apply 1 application topically 2 (two) times daily. 60 g 2  . Olmesartan-Amlodipine-HCTZ (TRIBENZOR) 20-5-12.5 MG TABS Take 1 tablet by mouth daily. 90 tablet 2  . potassium chloride SA (K-DUR,KLOR-CON) 20 MEQ tablet Take 1 tablet (20 mEq total) by mouth daily. 30 tablet 2  . pravastatin (PRAVACHOL) 40 MG tablet Take one tablet by mouth daily. 90 tablet 4   No current facility-administered medications for this visit.  REVIEW OF SYSTEMS:   Constitutional: Denies fevers, chills or abnormal night sweats Eyes: Denies blurriness of vision, double vision or watery eyes Ears, nose, mouth, throat, and face: Denies mucositis or sore throat Respiratory: Denies cough, dyspnea or wheezes Cardiovascular: Denies palpitation, chest discomfort or lower extremity swelling Gastrointestinal:  Denies nausea, heartburn or change in bowel habits Skin:  Denies abnormal skin rashes Lymphatics: Denies new lymphadenopathy or easy bruising Neurological:Denies numbness, tingling or new weaknesses Behavioral/Psych: Mood is stable, no new changes  Breast: Denies any palpable lumps or discharge All other systems were reviewed with the patient and are negative.  PHYSICAL EXAMINATION: ECOG PERFORMANCE STATUS: 0 - Asymptomatic  Vitals:   04/21/16 1220  BP: (!) 143/83  Pulse: 76  Resp: 18  Temp: 98.2 F (36.8 C)   Filed Weights   04/21/16 1220  Weight: 166 lb 9.6 oz (75.6 kg)   Physical Examination:  Performed with a female chaperone in the room. GENERAL:alert, no distress and comfortable SKIN: Mild hirsutism over her lower face, chest and back (she admits to her removal of her chest back and face but there is still some her present) skin color, texture, turgor are normal, no rashes or significant lesions EYES: normal, conjunctiva are pink and non-injected, sclera clear OROPHARYNX:no exudate, no erythema and lips, buccal mucosa, and tongue normal  NECK: supple, thyroid normal size, non-tender, without nodularity LYMPH:  no palpable lymphadenopathy in the cervical, axillary or inguinal LUNGS: clear to auscultation and percussion with normal breathing effort HEART: regular rate & rhythm and no murmurs and no lower extremity edema ABDOMEN:abdomen soft, round, vague fullness in the upper abdomen with mild pain to deep palpation in the right upper quadrant without guarding or rebound. No surgical scars. Normal bowel sounds Musculoskeletal:no cyanosis of digits and no clubbing  PSYCH: alert & oriented x 3 with fluent speech NEURO: no focal motor/sensory deficits   LABORATORY DATA:  I have reviewed the data as listed Lab Results  Component Value Date   WBC 6.3 04/13/2016   HGB 15.2 04/13/2016   HCT 41.4 04/13/2016   MCV 74 (L) 04/13/2016   PLT 275 04/13/2016   Lab Results  Component Value Date   NA 133 04/13/2016   K 2.6 (LL)  04/13/2016   CL 101 04/13/2016   CO2 27 04/13/2016   Free cortisol level 5.7 which is within normal limits dated 04/03/2016 Quantitative hCG is less than 1 dated 04/13/2016 Testosterone level is 412 which is elevated where the upper limits of normal and a female is 39 dated 04/13/2016  RADIOGRAPHIC STUDIES: I have personally reviewed the radiological reports and agreed with the findings in the report. I reviewed the images with both she and her mother. There are no dominant or suspicious lesions within the chest however there is a large upper abdominal mass that appears soft tissue density and possible mixed fatty density measuring 17.3 x 10.2 x 17.5 cm with a few scattered central calcifications. There is also an enlarged uterus with numerous calcified and noncalcified fibroids. There is a rim of calcified exophytic subserosal fibroid on the left measuring up to 3.8 cm corresponding to the left pelvic calcifications seen on prior scout image. Multiple calcified fibroid centrally. There is a mixed density predominantly solid partially enhancing mass within the right adnexal region measuring 5.5 x 3.7 cm. There is an enhancing solid mass in the midpole of the right kidney measuring up to 4.6 cm.  ASSESSMENT AND PLAN:  1. Large upper abdominal soft tissue  mass. 2. Right renal mass concerning for malignancy. 3. Enlarged uterus with findings consistent with fibroids with calcification unable to rule out malignancy. 4. Elevated testosterone with clinical evidence of hirsutism possibly related to intra-abdominal lesions such as germ cell tumor.  I reviewed all of these findings with the patient and her mother. She already has an appointment with Dr. Denman George and we are arranging an appointment with Dr. [name] who I discussed the case with. I will also schedule the patient for an interventional radiology guided biopsy of the upper abdominal mass to evaluate for possible germ cell tumor. All questions  were answered and the patient understands the plan as outlined above.  All questions were answered. The patient knows to call the clinic with any problems, questions or concerns.    Frederich Cha, MD 12:55 PM

## 2016-04-21 NOTE — Patient Instructions (Signed)
Plan to see Dr. Denman George with GYN Oncology as scheduled.  You will receive a phone call from the Hospital about arranging for the biopsy of the abdominal mass.  You will also receive a phone call from Dr. Adela Glimpse (Urology) office ([facility], office number [phone]) with an appointment to meet with him.  Please call our office for any questions or concerns.

## 2016-04-22 ENCOUNTER — Ambulatory Visit: Payer: Medicare Other | Admitting: Physician Assistant

## 2016-04-23 ENCOUNTER — Telehealth: Payer: Self-pay | Admitting: *Deleted

## 2016-04-23 NOTE — Telephone Encounter (Signed)
FYI "Marlowe Kays wih Dr[facility] [phone]) calling to load this patient in our system.  She was referred by Dr. Rolanda Jay.  Will see Dr. Alinda Money May 05, 2016 at 9:30.  We need Dr. Suzzanne Cloud NPI number and his fax number."    Provided NPI and fax number at [place] [phone].  No further questions.

## 2016-04-24 ENCOUNTER — Other Ambulatory Visit: Payer: Self-pay | Admitting: *Deleted

## 2016-04-24 DIAGNOSIS — E876 Hypokalemia: Secondary | ICD-10-CM

## 2016-04-24 NOTE — Addendum Note (Signed)
Addended by: Teddy Spike on: 04/24/2016 09:18 AM   Modules accepted: Orders

## 2016-04-26 NOTE — Progress Notes (Signed)
Consult Note: Gyn-Onc  Consult was requested by Dr. Dalbert Batman for the evaluation of Bonnie Torres 60 y.o. female  CC:  Chief Complaint  Patient presents with  . pelvic mass    New Consultation    Assessment/Plan:  Bonnie Torres  is a 60 y.o.  year old with large abdominal, renal and adnexal masses and virilization. It is unclear if this is singular process or metastatic malignancy It is possible that her virilization symptoms are secondary to an ovarian sertoli-leydig tumor (which is considered a low grade malignancy), however, benign processes such as ovarian hyperthecosis can also cause virilization. Only 2% of Sertoli-Leydig tumors are associated with extra-ovarian metastases, and therefore, I think that the hepatic and right renal masses are less likely metastases. I recommend a preoperative biopsy of the perihepatic mass to better define the likely pathology. The renal mass is likely to be a renal cell carcinoma.  We will plan for hysterectomy with BSO, possible lymphadenectomy as part of a planned combined procedure with Dr's Alinda Money and Rolanda Jay.   Frozen section will be performed. If Sertoli-Leydig tumor is identified, we will evaluate lymph nodes for grossly enlarged nodes to debulk, however, due to the extremely rare incidence of lymph node metastases in this tumor, lymphadenectomy would not routinely be performed.  I discussed with the patient our component of the procedure and planned approach. I discussed risks specific with the hysterectomy component of the procedure (with particular relation to urologic or GI complications). I recommend prophylaxis with cefoxatin antibiotics.   HPI: Bonnie Torres is a 60 year old woman who is seen in consultation at the request of Dr Fanny Skates for large adnexal masses in addition to a right renal mass, and a subhepatic abdominal mass.  THe patient is largely asymptomatic from her masses, however reports virilization (with hirsutism  and clitoromegaly) for approximately 1 year prior to diagnosis. Serum testosterone is elevated at 412 on 03/23/16. She is postmenopausal (LMP 2012)  Of note CA 125 is normal at 25 (04/13/16) as were other germ cell tumor markers (AFP, HCG, LDH).  Imaging performed on 04/08/16 with CT abdomen and pelvis showed Several small thyroid nodules, a 4.6 x 4.2 cm enhancing mass in the mid pole of the right kidney concerning for renal cell carcinoma. The dominant mass in the abdomen was a 17.3 x 10.2 x 17.5 cm large mixed soft tissue and fatty density mass with mass effect upon the left lobe of the liver and the stomach. The pelvis reveals a enlarged uterus with numerous calcified and noncalcified fibroids. There is a rim calcified exophytic subserosal fibroid on the left measuring 3.8 cm, and a mixed density probably solid partially enhancing mass within the right adnexa measuring 5.5 x 3.7 cm. It is difficult to determine if this is a right ovary or a pedunculated fibroid.   She was seen by Dr Rolanda Jay who recommended surgical excision with exploratory laparotomy. She has also sought consultation with Dr Alinda Money from [facility] who recommended right nephrectomy/partial nephrectomy.    She has a family history significant for a sister with epithelial ovarian cancer and a first cousin with ovarian cancer (both in their 24's).    Current Meds:  Outpatient Encounter Prescriptions as of 04/27/2016  Medication Sig  . allopurinol (ZYLOPRIM) 100 MG tablet Take 1 tablet by mouth  daily  . cloNIDine (CATAPRES) 0.3 MG tablet Take 1 tablet (0.3 mg total) by mouth 2 (two) times daily. (Patient taking differently: Take 0.3 mg by  mouth at bedtime. )  . colchicine 0.6 MG tablet Take 1 tablet by mouth  daily  . metoprolol (LOPRESSOR) 100 MG tablet Take 1 tablet (100 mg total) by mouth 2 (two) times daily. (Patient taking differently: Take 100 mg by mouth daily. )  . potassium chloride SA (K-DUR,KLOR-CON) 20 MEQ tablet  Take 1 tablet (20 mEq total) by mouth daily.  . pravastatin (PRAVACHOL) 40 MG tablet Take one tablet by mouth daily.  . meloxicam (MOBIC) 15 MG tablet Take 1 tablet (15 mg total) by mouth daily. As needed for back pain (Patient not taking: Reported on 04/27/2016)  . nystatin ointment (MYCOSTATIN) Apply 1 application topically 2 (two) times daily.  . Olmesartan-Amlodipine-HCTZ (TRIBENZOR) 20-5-12.5 MG TABS Take 1 tablet by mouth daily. (Patient not taking: Reported on 04/27/2016)   No facility-administered encounter medications on file as of 04/27/2016.     Allergy: No Known Allergies  Social Hx:   Social History   Social History  . Marital status: Widowed    Spouse name: N/A  . Number of children: N/A  . Years of education: N/A   Occupational History  . Not on file.   Social History Main Topics  . Smoking status: Never Smoker  . Smokeless tobacco: Never Used  . Alcohol use No  . Drug use: No  . Sexual activity: Not Currently   Other Topics Concern  . Not on file   Social History Narrative  . No narrative on file    Past Surgical Hx:  Past Surgical History:  Procedure Laterality Date  . SHOULDER SURGERY Right 2010  . TOE SURGERY Right Hammer Toe   2004    Past Medical Hx:  Past Medical History:  Diagnosis Date  . Hyperlipidemia   . Hypertension     Past Gynecological History:  SVD x 1. Denies history of fibroids or menorrhagia. LMP 2012 No LMP recorded. Patient is postmenopausal.  Family Hx:  Family History  Problem Relation Age of Onset  . Heart attack Paternal Aunt   . Hypertension Mother   . Diabetes Maternal Aunt   . Cancer Paternal Aunt     stomach  . Cancer Sister     Review of Systems:  Constitutional  Feels well,    ENT Normal appearing ears and nares bilaterally Skin/Breast  + hirsutism Cardiovascular  No chest pain, shortness of breath, or edema  Pulmonary  No cough or wheeze.  Gastro Intestinal  No nausea, vomitting, or  diarrhoea. No bright red blood per rectum, no abdominal pain, change in bowel movement, or constipation.  Genito Urinary  No frequency, urgency, dysuria, no bleeding, no pelvic pressure Musculo Skeletal  No myalgia, arthralgia, joint swelling or pain  Neurologic  No weakness, numbness, change in gait,  Psychology  No depression, anxiety, insomnia.   Vitals:  Blood pressure (!) 153/85, pulse 92, temperature 98.2 F (36.8 C), temperature source Oral, resp. rate 18, weight 167 lb 3.2 oz (75.8 kg), SpO2 100 %.  Physical Exam: Shaved stubble on face and abdomen consistent with hirsutism. Neck  Supple NROM, without any enlargements.  Lymph Node Survey No cervical supraclavicular or inguinal adenopathy Cardiovascular  Pulse normal rate, regularity and rhythm. S1 and S2 normal.  Lungs  Clear to auscultation bilateraly, without wheezes/crackles/rhonchi. Good air movement.  Skin  No rash/lesions/breakdown  Psychiatry  Alert and oriented to person, place, and time  Abdomen  Normoactive bowel sounds, abdomen soft, non-tender and overweight without evidence of hernia. Mass appreciated filling RUQ (firm).  No pelvic mass appreciated on abdominal exam. Back No CVA tenderness Genito Urinary  Vulva/vagina: clitoromegaly.  No lesions. No discharge or bleeding.  Bladder/urethra:  No lesions or masses, well supported bladder  Vagina: normal  Cervix: Normal appearing, no lesions.  Uterus: Bulky, 12-14cm, mobile, no parametrial involvement or nodularity.  Adnexa: no discrete palpable masses. Rectal  Good tone, no masses no cul de sac nodularity.  Extremities  No bilateral cyanosis, clubbing or edema.   Donaciano Eva, MD  04/27/2016, 9:51 AM

## 2016-04-27 ENCOUNTER — Encounter: Payer: Self-pay | Admitting: Gynecologic Oncology

## 2016-04-27 ENCOUNTER — Ambulatory Visit: Payer: Medicare Other | Attending: Gynecologic Oncology | Admitting: Gynecologic Oncology

## 2016-04-27 VITALS — BP 153/85 | HR 92 | Temp 98.2°F | Resp 18 | Wt 167.2 lb

## 2016-04-27 DIAGNOSIS — Z800 Family history of malignant neoplasm of digestive organs: Secondary | ICD-10-CM | POA: Diagnosis not present

## 2016-04-27 DIAGNOSIS — Z833 Family history of diabetes mellitus: Secondary | ICD-10-CM | POA: Diagnosis not present

## 2016-04-27 DIAGNOSIS — Z8041 Family history of malignant neoplasm of ovary: Secondary | ICD-10-CM | POA: Diagnosis not present

## 2016-04-27 DIAGNOSIS — E785 Hyperlipidemia, unspecified: Secondary | ICD-10-CM | POA: Diagnosis not present

## 2016-04-27 DIAGNOSIS — I10 Essential (primary) hypertension: Secondary | ICD-10-CM | POA: Diagnosis not present

## 2016-04-27 DIAGNOSIS — D252 Subserosal leiomyoma of uterus: Secondary | ICD-10-CM | POA: Insufficient documentation

## 2016-04-27 DIAGNOSIS — E288 Other ovarian dysfunction: Secondary | ICD-10-CM | POA: Insufficient documentation

## 2016-04-27 DIAGNOSIS — R1900 Intra-abdominal and pelvic swelling, mass and lump, unspecified site: Secondary | ICD-10-CM | POA: Insufficient documentation

## 2016-04-27 DIAGNOSIS — N2889 Other specified disorders of kidney and ureter: Secondary | ICD-10-CM | POA: Diagnosis not present

## 2016-04-27 DIAGNOSIS — L680 Hirsutism: Secondary | ICD-10-CM

## 2016-04-27 DIAGNOSIS — Z780 Asymptomatic menopausal state: Secondary | ICD-10-CM | POA: Diagnosis not present

## 2016-04-27 DIAGNOSIS — N859 Noninflammatory disorder of uterus, unspecified: Secondary | ICD-10-CM

## 2016-04-27 DIAGNOSIS — Z8249 Family history of ischemic heart disease and other diseases of the circulatory system: Secondary | ICD-10-CM | POA: Diagnosis not present

## 2016-04-27 DIAGNOSIS — N858 Other specified noninflammatory disorders of uterus: Secondary | ICD-10-CM | POA: Insufficient documentation

## 2016-04-27 NOTE — Patient Instructions (Signed)
Plan to see Dr. Alinda Money as scheduled.  We will contact you with a date for your surgery.  Please call our office for any questions or concerns.    Dr. Denman George will be performing a total abdominal hysterectomy, bilateral salpingo-oophorectomy, possible lymph node dissection.   Abdominal Hysterectomy Abdominal hysterectomy is a surgery to remove your womb (uterus). Your womb is the part of your body that contains a growing baby. The surgery may be done for many reasons. These may include cancer, growths (tumors), long-term pain, or bleeding. You may also need other reproductive parts removed during this surgery. This will depend on why you need to have the surgery. BEFORE THE PROCEDURE  Talk to your doctor about the changes to your body. These changes may be physical and emotional.  You may need to have blood work done. You may also need X-rays done.  Quit smoking if you smoke. Ask your doctor for help.  Stop taking medicines that thin your blood as told by your doctor.  Your doctor may have you take other medicines. Take all medicines as told by your doctor.  Shower or take a bath the night or morning before surgery. PROCEDURE  This surgery is done in the hospital.  You are given a medicine that makes you go to sleep (general anesthetic).  The doctor will make a cut (incision) through the skin in your lower belly.  The cut may be about 5-7 inches long. It may go side-to-side or up-and-down.  The doctor will move the body tissue that covers your womb. The doctor will carefully remove your womb. The doctor may remove any other reproductive parts that need to be removed.  The doctor will use clamps or stitches (sutures) to control bleeding.  The doctor will close your cut with stitches or metal clips. AFTER THE PROCEDURE  You will have pain right after the procedure.  You will be given pain medicine in the recovery room.  You will be taken to your hospital room after the medicines  that made you go to sleep wear off.  You will be told how to take care of yourself at home.   This information is not intended to replace advice given to you by your health care provider. Make sure you discuss any questions you have with your health care provider.   Document Released: 06/20/2013 Document Reviewed: 06/20/2013 Elsevier Interactive Patient Education [employer].

## 2016-04-29 ENCOUNTER — Other Ambulatory Visit: Payer: Self-pay | Admitting: General Surgery

## 2016-04-30 ENCOUNTER — Ambulatory Visit (HOSPITAL_COMMUNITY)
Admission: RE | Admit: 2016-04-30 | Discharge: 2016-04-30 | Disposition: A | Discharge status: Home / Self Care | Payer: Medicare Other | Source: Ambulatory Visit | Attending: Gynecologic Oncology | Admitting: Gynecologic Oncology

## 2016-04-30 ENCOUNTER — Encounter (HOSPITAL_COMMUNITY): Payer: Self-pay

## 2016-04-30 DIAGNOSIS — M7989 Other specified soft tissue disorders: Secondary | ICD-10-CM | POA: Diagnosis not present

## 2016-04-30 DIAGNOSIS — E785 Hyperlipidemia, unspecified: Secondary | ICD-10-CM | POA: Diagnosis not present

## 2016-04-30 DIAGNOSIS — I10 Essential (primary) hypertension: Secondary | ICD-10-CM | POA: Insufficient documentation

## 2016-04-30 DIAGNOSIS — R1900 Intra-abdominal and pelvic swelling, mass and lump, unspecified site: Secondary | ICD-10-CM | POA: Insufficient documentation

## 2016-04-30 DIAGNOSIS — R1909 Other intra-abdominal and pelvic swelling, mass and lump: Secondary | ICD-10-CM

## 2016-04-30 DIAGNOSIS — Z8249 Family history of ischemic heart disease and other diseases of the circulatory system: Secondary | ICD-10-CM | POA: Insufficient documentation

## 2016-04-30 LAB — CBC
HEMATOCRIT: 42.7 % (ref 36.0–46.0)
Hemoglobin: 15.5 g/dL — ABNORMAL HIGH (ref 12.0–15.0)
MCH: 27.3 pg (ref 26.0–34.0)
MCHC: 36.3 g/dL — ABNORMAL HIGH (ref 30.0–36.0)
MCV: 75.3 fL — AB (ref 78.0–100.0)
PLATELETS: 293 10*3/uL (ref 150–400)
RBC: 5.67 MIL/uL — AB (ref 3.87–5.11)
RDW: 13.4 % (ref 11.5–15.5)
WBC: 6 10*3/uL (ref 4.0–10.5)

## 2016-04-30 LAB — APTT: aPTT: 27 seconds (ref 24–36)

## 2016-04-30 LAB — PROTIME-INR
INR: 0.98
Prothrombin Time: 13 seconds (ref 11.4–15.2)

## 2016-04-30 IMAGING — US US BIOPSY
1 series · 13 of 15 positions shown · non-contrast
Comparison: none

INDICATION: Large intra-abdominal mixed soft tissue and fat density mass
measuring 17 cm concerning for liposarcoma

[Series 1: us biopsy · 0.26mm/px · 13 of 15 slices shown]
[im 1/15]
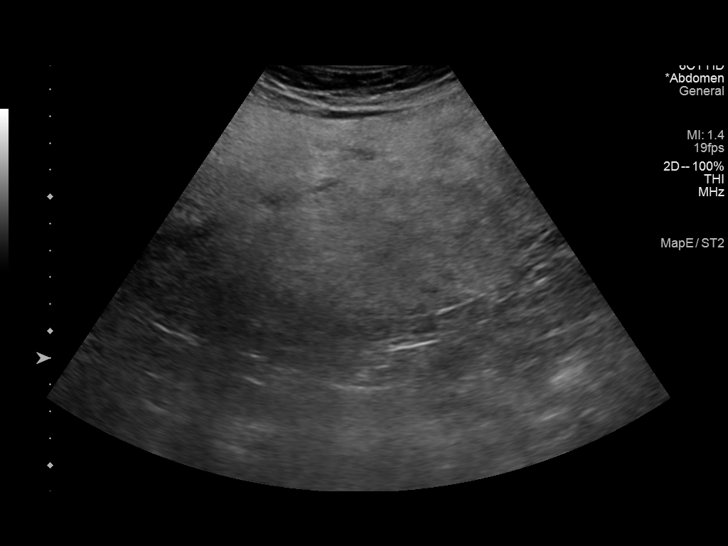
[im 2/15]
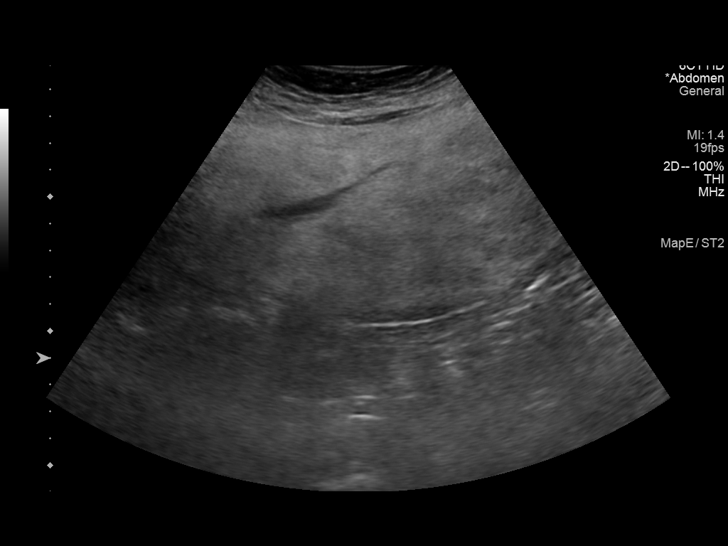
[im 3/15]
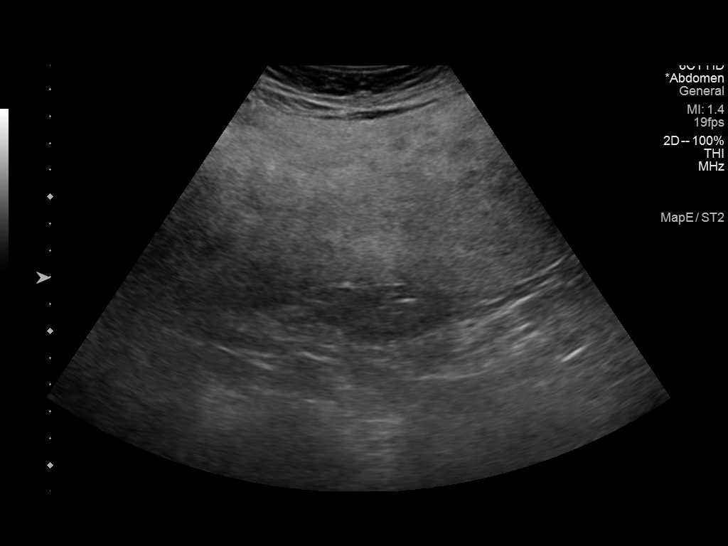
[im 5/15]
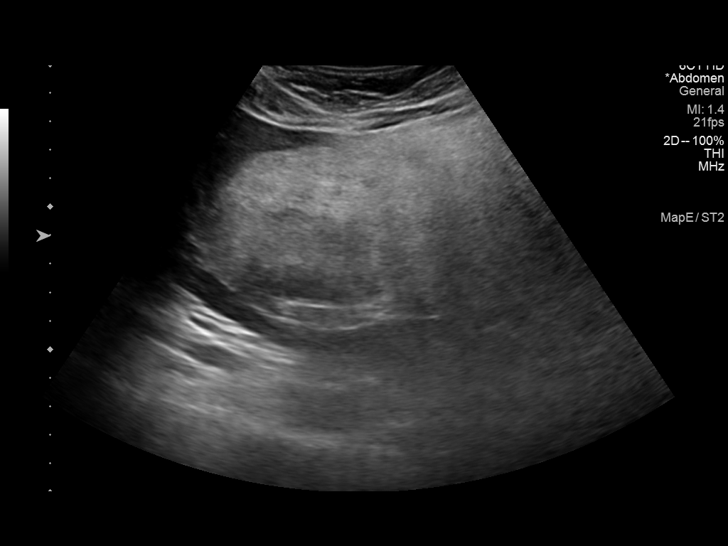
[im 6/15]
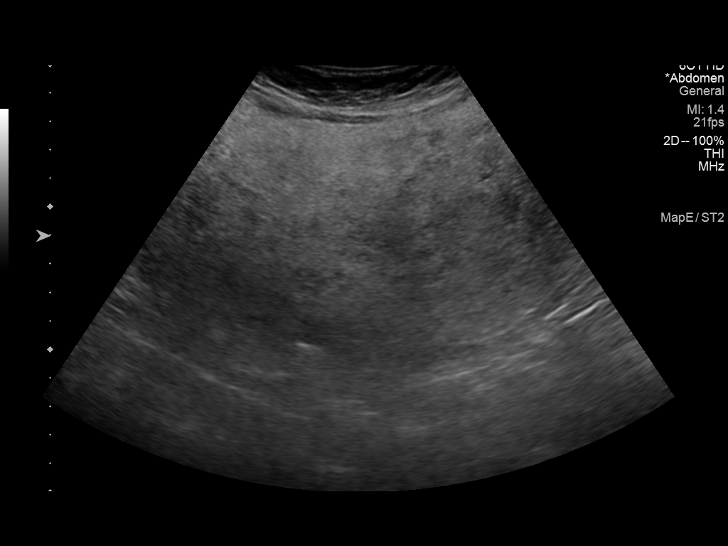
[im 7/15]
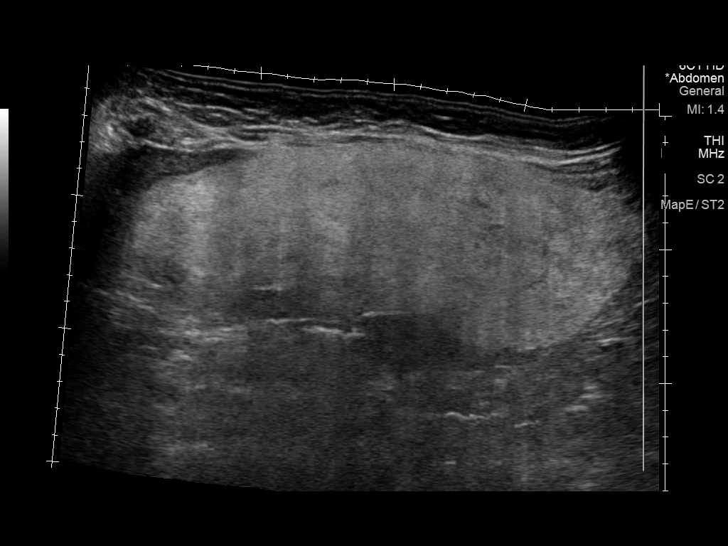
[im 8/15]
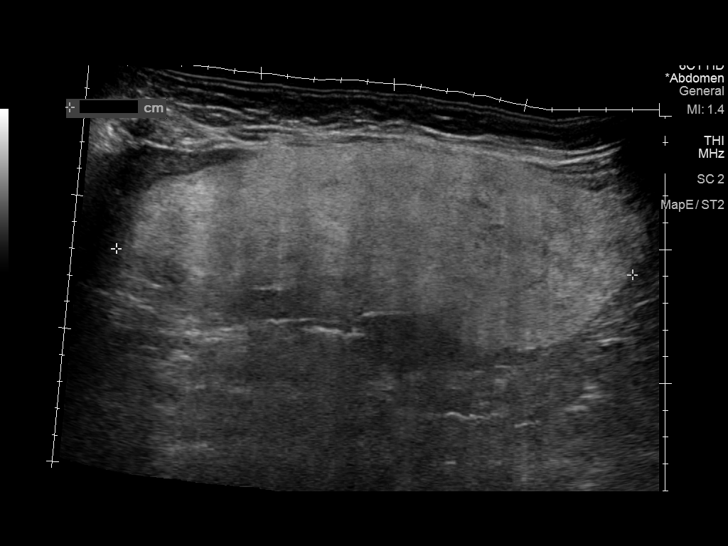
[im 9/15]
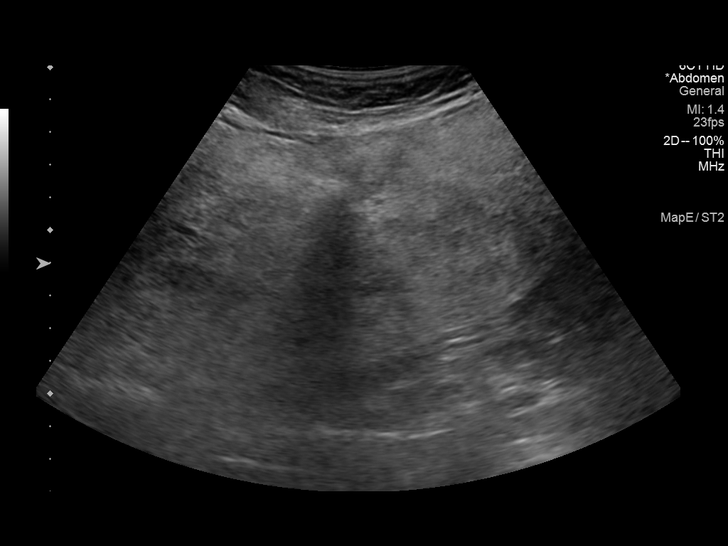
[im 10/15]
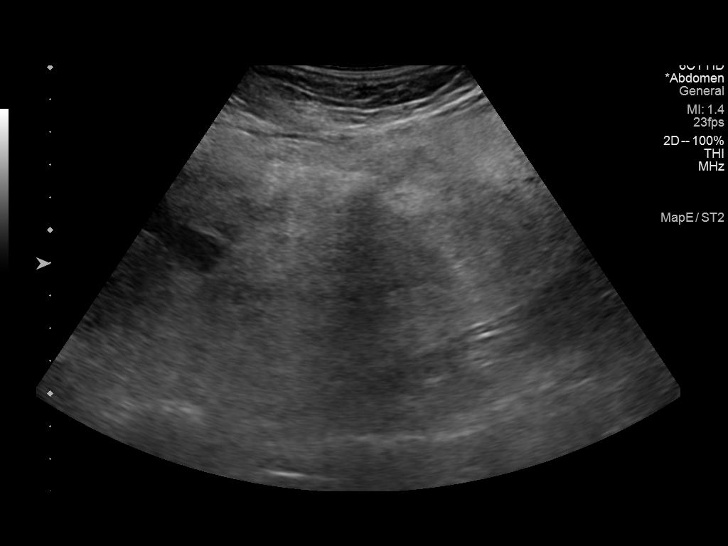
[im 11/15]
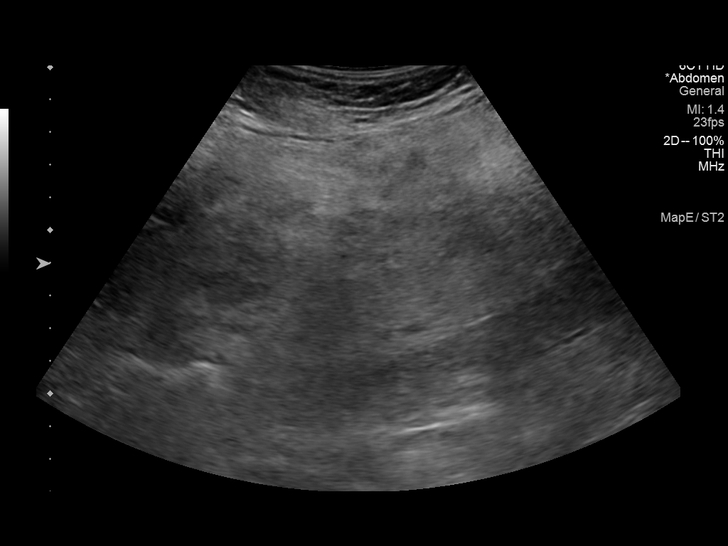
[im 13/15]
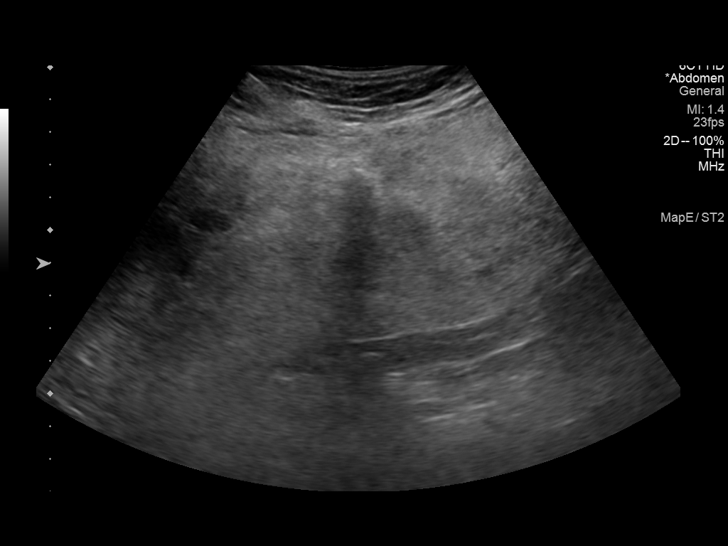
[im 14/15]
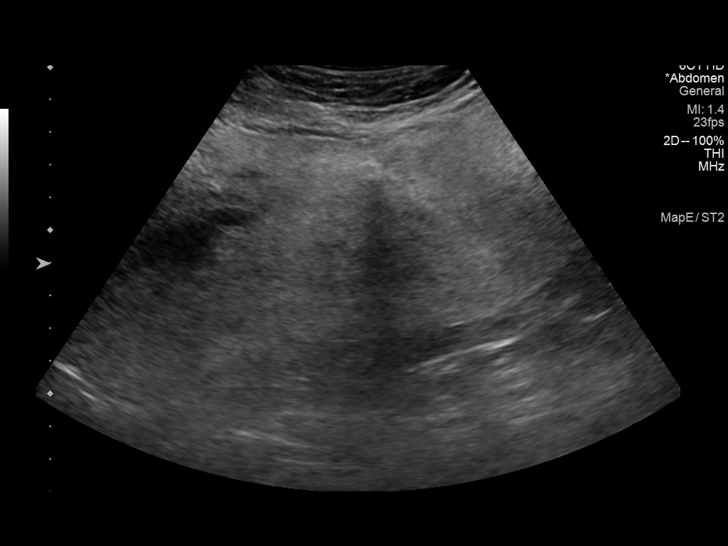
[im 15/15]
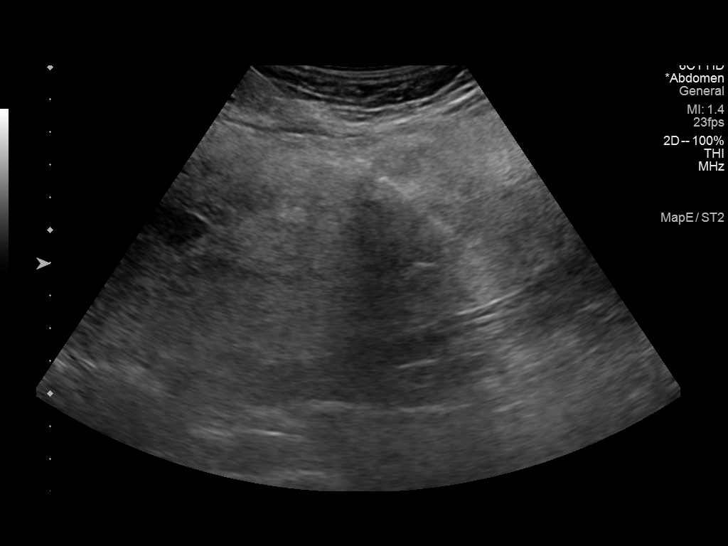

[13 of 15 positions shown; findings below may reference images not displayed]

EXAM:
ULTRASOUND BIOPSY CORE LARGE MIDLINE ABDOMINAL MASS

MEDICATIONS:
1% LIDOCAINE LOCALLY

ANESTHESIA/SEDATION:
Moderate (conscious) sedation was employed during this procedure. A
total of Versed 4.0 mg and Fentanyl 50 mcg was administered
intravenously.

Moderate Sedation Time: 16 minutes. The patient's level of
consciousness and vital signs were monitored continuously by
radiology nursing throughout the procedure under my direct
supervision.

FLUOROSCOPY TIME:  Fluoroscopy Time: NONE.

COMPLICATIONS:
None immediate.

PROCEDURE:
Informed written consent was obtained from the patient after a
thorough discussion of the procedural risks, benefits and
alternatives. All questions were addressed. Maximal Sterile Barrier
Technique was utilized including caps, mask, sterile gowns, sterile
gloves, sterile drape, hand hygiene and skin antiseptic. A timeout
was performed prior to the initiation of the procedure.

Previous imaging reviewed. Preliminary ultrasound performed. The
large mixed soft tissue fat density mass was localized in the
midline above the umbilicus. Overlying skin marked.

Under sterile conditions and local anesthesia, a 17 gauge access
needle was advanced into the lesion under direct ultrasound. Needle
position confirms ultrasound. Images obtained for documentation.
Several 18 gauge core biopsies obtained. Sampling was fragmented.
These were placed in formalin. Needle removed. Postprocedure imaging
demonstrates no hemorrhage or hematoma. Patient tolerated the biopsy
well.
IMPRESSION: Successful ultrasound 18 gauge core biopsy of the midline large
abdominal mixed soft tissue fat density mass.

## 2016-04-30 MED ORDER — MIDAZOLAM HCL 2 MG/2ML IJ SOLN
INTRAMUSCULAR | Status: AC
  Filled 2016-04-30: qty 6

## 2016-04-30 MED ORDER — SODIUM CHLORIDE 0.9 % IV SOLN
INTRAVENOUS | Status: DC
  Administered 2016-04-30: 11:00:00 via INTRAVENOUS

## 2016-04-30 MED ORDER — MIDAZOLAM HCL 2 MG/2ML IJ SOLN
INTRAMUSCULAR | Status: AC | PRN
  Administered 2016-04-30 (×4): 1 mg via INTRAVENOUS

## 2016-04-30 MED ORDER — FENTANYL CITRATE (PF) 100 MCG/2ML IJ SOLN
INTRAMUSCULAR | Status: AC
  Filled 2016-04-30: qty 4

## 2016-04-30 MED ORDER — FENTANYL CITRATE (PF) 100 MCG/2ML IJ SOLN
INTRAMUSCULAR | Status: AC | PRN
  Administered 2016-04-30: 50 ug via INTRAVENOUS

## 2016-04-30 NOTE — H&P (Signed)
Chief Complaint: intra-abdominal mass  Referring Physician:Dr. Eddie Dibbles  Supervising Physician: Daryll Brod  Patient Status: [facility] - Out-pt  HPI: Bonnie Torres is an 60 y.o. female who presented over a month ago to her physician to get her hormones checked due to hirsutism.  She underwent a CT scan of the abdomen where she was found to have a large intra-abdominal mass with several other separate and unique lesions.  She saw Dr. Dalbert Batman who has since sent her to Dr. Rolanda Jay.  A request has been made for a biopsy to be performed.  She presents today for this procedure.    Past Medical History:  Past Medical History:  Diagnosis Date  . Hyperlipidemia   . Hypertension     Past Surgical History:  Past Surgical History:  Procedure Laterality Date  . SHOULDER SURGERY Right 2010  . TOE SURGERY Right Hammer Toe   2004    Family History:  Family History  Problem Relation Age of Onset  . Heart attack Paternal Aunt   . Hypertension Mother   . Diabetes Maternal Aunt   . Cancer Paternal Aunt     stomach  . Cancer Sister     Social History:  reports that she has never smoked. She has never used smokeless tobacco. She reports that she does not drink alcohol or use drugs.  Allergies: No Known Allergies  Medications: Medications reviewed in Epic  Please HPI for pertinent positives, otherwise complete 10 system ROS negative.  Mallampati Score: MD Evaluation Airway: WNL Heart: WNL Abdomen: WNL Chest/ Lungs: WNL ASA  Classification: 2 Mallampati/Airway Score: Two  Physical Exam: BP (!) 150/92 (BP Location: Right Arm)   Pulse 69   Temp 98.7 F (37.1 C) (Oral)   Resp 16   SpO2 99%  There is no height or weight on file to calculate BMI. General: pleasant, WD, WN black female who is laying in bed in NAD HEENT: head is normocephalic, atraumatic.  Sclera are noninjected.  PERRL.  Ears and nose without any masses or lesions.  Mouth is pink and moist Heart: regular,  rate, and rhythm.  Normal s1,s2. No obvious murmurs, gallops, or rubs noted.  Palpable radial and pedal pulses bilaterally Lungs: CTAB, no wheezes, rhonchi, or rales noted.  Respiratory effort nonlabored Abd: soft, NT, seems distended to me in upper abdomen where mass is located (patient denies), +BS, no hernias, or organomegaly Psych: A&Ox3 with an appropriate affect.   Labs: Results for orders placed or performed during the hospital encounter of 04/30/16 (from the past 48 hour(s))  APTT upon arrival     Status: None   Collection Time: 04/30/16 11:15 AM  Result Value Ref Range   aPTT 27 24 - 36 seconds  CBC upon arrival     Status: Abnormal   Collection Time: 04/30/16 11:15 AM  Result Value Ref Range   WBC 6.0 4.0 - 10.5 K/uL   RBC 5.67 (H) 3.87 - 5.11 MIL/uL   Hemoglobin 15.5 (H) 12.0 - 15.0 g/dL   HCT 42.7 36.0 - 46.0 %   MCV 75.3 (L) 78.0 - 100.0 fL   MCH 27.3 26.0 - 34.0 pg   MCHC 36.3 (H) 30.0 - 36.0 g/dL    Comment: PRE-WARMING TECHNIQUE USED   RDW 13.4 11.5 - 15.5 %   Platelets 293 150 - 400 K/uL  Protime-INR upon arrival     Status: None   Collection Time: 04/30/16 11:15 AM  Result Value Ref Range  Prothrombin Time 13.0 11.4 - 15.2 seconds   INR 0.98     Imaging: No results found.  Assessment/Plan 1. Intra-abdominal mass -we will proceed today with a biopsy of this mass to determine pathology -labs and vitals reviewed -Risks and Benefits discussed with the patient including, but not limited to bleeding, infection, damage to adjacent structures or low yield requiring additional tests. All of the patient's questions were answered, patient is agreeable to proceed. Consent signed and in chart.  Thank you for this interesting consult.  I greatly enjoyed meeting Bonnie Torres and look forward to participating in their care.  A copy of this report was sent to the requesting provider on this date.  Electronically Signed: Henreitta Cea 04/30/2016, 12:17 PM   I  spent a total of  30 Minutes   in face to face in clinical consultation, greater than 50% of which was counseling/coordinating care for intra-abdominal mass

## 2016-04-30 NOTE — Procedures (Signed)
S/p [place] BX LARGE ABDOMINAL FATTY MASS BX  NO COMP SAMPLING WAS FRAGMENTED B/C OF THE FATTY COMPONENT  Path pending Full report in PACS

## 2016-04-30 NOTE — Discharge Instructions (Signed)
Needle Biopsy, Care After °These instructions give you information about caring for yourself after your procedure. Your doctor may also give you more specific instructions. Call your doctor if you have any problems or questions after your procedure. °HOME CARE °· Rest as told by your doctor. °· Take medicines only as told by your doctor. °· There are many different ways to close and cover the biopsy site, including stitches (sutures), skin glue, and adhesive strips. Follow instructions from your doctor about: °¨ How to take care of your biopsy site. °¨ When and how you should change your bandage (dressing). °¨ When you should remove your dressing. °¨ Removing whatever was used to close your biopsy site. °· Check your biopsy site every day for signs of infection. Watch for: °¨ Redness, swelling, or pain. °¨ Fluid, blood, or pus. °GET HELP IF: °· You have a fever. °· You have redness, swelling, or pain at the biopsy site, and it lasts longer than a few days. °· You have fluid, blood, or pus coming from the biopsy site. °· You feel sick to your stomach (nauseous). °· You throw up (vomit). °GET HELP RIGHT AWAY IF: °· You are short of breath. °· You have trouble breathing. °· Your chest hurts. °· You feel dizzy or you pass out (faint). °· You have bleeding that does not stop with pressure or a bandage. °· You cough up blood. °· Your belly (abdomen) hurts. °  °This information is not intended to replace advice given to you by your health care provider. Make sure you discuss any questions you have with your health care provider. °  °Document Released: 05/28/2008 Document Revised: 10/30/2014 Document Reviewed: 06/11/2014 °Elsevier Interactive Patient Education ©2016 [employer] °Moderate Conscious Sedation, Adult, Care After °Refer to this sheet in the next few weeks. These instructions provide you with information on caring for yourself after your procedure. Your health care provider may also give you more specific  instructions. Your treatment has been planned according to current medical practices, but problems sometimes occur. Call your health care provider if you have any problems or questions after your procedure. °WHAT TO EXPECT AFTER THE PROCEDURE  °After your procedure: °· You may feel sleepy, clumsy, and have poor balance for several hours. °· Vomiting may occur if you eat too soon after the procedure. °HOME CARE INSTRUCTIONS °· Do not participate in any activities where you could become injured for at least 24 hours. Do not: °· Drive. °· Swim. °· Ride a bicycle. °· Operate heavy machinery. °· Cook. °· Use power tools. °· Climb ladders. °· Work from a high place. °· Do not make important decisions or sign legal documents until you are improved. °· If you vomit, drink water, juice, or soup when you can drink without vomiting. Make sure you have little or no nausea before eating solid foods. °· Only take over-the-counter or prescription medicines for pain, discomfort, or fever as directed by your health care provider. °· Make sure you and your family fully understand everything about the medicines given to you, including what side effects may occur. °· You should not drink alcohol, take sleeping pills, or take medicines that cause drowsiness for at least 24 hours. °· If you smoke, do not smoke without supervision. °· If you are feeling better, you may resume normal activities 24 hours after you were sedated. °· Keep all appointments with your health care provider. °SEEK MEDICAL CARE IF: °· Your skin is pale or bluish in color. °· You   continue to feel nauseous or vomit. °· Your pain is getting worse and is not helped by medicine. °· You have bleeding or swelling. °· You are still sleepy or feeling clumsy after 24 hours. °SEEK IMMEDIATE MEDICAL CARE IF: °· You develop a rash. °· You have difficulty breathing. °· You develop any type of allergic problem. °· You have a fever. °MAKE SURE YOU: °· Understand these  instructions. °· Will watch your condition. °· Will get help right away if you are not doing well or get worse. °  °This information is not intended to replace advice given to you by your health care provider. Make sure you discuss any questions you have with your health care provider. °  °Document Released: 04/05/2013 Document Revised: 07/06/2014 Document Reviewed: 04/05/2013 °Elsevier Interactive Patient Education ©2016 Elsevier Inc. ° °

## 2016-05-07 ENCOUNTER — Other Ambulatory Visit: Payer: Self-pay | Admitting: Urology

## 2016-05-07 ENCOUNTER — Telehealth: Payer: Self-pay | Admitting: Gynecologic Oncology

## 2016-05-07 NOTE — Telephone Encounter (Signed)
Left message for patient.  She returned call.  Arranged for her to meet with Dr. Rolanda Jay same day after she meets with Dr. Alinda Money to discuss her upcoming surgery.  Verbalizing understanding.

## 2016-05-08 LAB — TISSUE HYBRIDIZATION TO NCBH

## 2016-05-12 ENCOUNTER — Other Ambulatory Visit: Payer: Self-pay | Admitting: Physician Assistant

## 2016-05-12 DIAGNOSIS — G4700 Insomnia, unspecified: Secondary | ICD-10-CM

## 2016-05-12 DIAGNOSIS — I10 Essential (primary) hypertension: Secondary | ICD-10-CM

## 2016-05-13 ENCOUNTER — Encounter (HOSPITAL_COMMUNITY): Payer: Self-pay

## 2016-05-19 ENCOUNTER — Other Ambulatory Visit: Payer: Self-pay | Admitting: Hematology & Oncology

## 2016-05-19 ENCOUNTER — Ambulatory Visit (HOSPITAL_BASED_OUTPATIENT_CLINIC_OR_DEPARTMENT_OTHER): Payer: Medicare Other | Admitting: General Surgery

## 2016-05-19 ENCOUNTER — Encounter: Payer: Self-pay | Admitting: General Surgery

## 2016-05-19 ENCOUNTER — Other Ambulatory Visit: Payer: Self-pay | Admitting: Physician Assistant

## 2016-05-19 ENCOUNTER — Other Ambulatory Visit: Payer: Self-pay | Admitting: Family Medicine

## 2016-05-19 VITALS — BP 149/89 | HR 73 | Temp 98.0°F | Resp 18

## 2016-05-19 DIAGNOSIS — R1901 Right upper quadrant abdominal swelling, mass and lump: Secondary | ICD-10-CM

## 2016-05-19 DIAGNOSIS — E876 Hypokalemia: Secondary | ICD-10-CM

## 2016-05-19 DIAGNOSIS — D49511 Neoplasm of unspecified behavior of right kidney: Secondary | ICD-10-CM | POA: Diagnosis not present

## 2016-05-19 NOTE — Progress Notes (Signed)
Melissa-   Will she be admitted the day before surgery or just after surgery? Just trying to keep our schedules up to date  Thanks--just call [phone] with answer and there is voicemail  Thank you

## 2016-05-19 NOTE — Patient Instructions (Signed)
Please call for any questions or concerns at [phone].  How and Where to Give Subcutaneous Enoxaparin Injections Enoxaparin is an injectable medicine. It is used to help prevent blood clots from developing in your veins. Health care providers often use anticoagulants like enoxaparin to prevent clots following surgery. Enoxaparin is also used in combination with other medicines to treat blood clots and heart attacks. If blood clots are left untreated, they can be life threatening. Enoxaparin comes in single-use syringes. You inject enoxaparin through a syringe into your belly (abdomen). You should change the injection site each time you give yourself a shot. Continue the enoxaparin injections as directed by your health care provider. Your health care provider will use blood clotting test results to decide when you can safely stop using enoxaparin injections. If your health care provider prescribes any additional medicines, use the medicines exactly as directed. How do I inject enoxaparin? 1. Wash your hands with soap and water. 2. Clean the selected injection site as directed by your health care provider. 3. Remove the needle cap by pulling it straight off the syringe. 4. When using a prefilled syringe, do not push the air bubble out of the syringe before the injection. The air bubble will help you get all of the medicine out of the syringe. 5. Hold the syringe like a pencil using your writing hand. 6. Use your other hand to pinch and hold an inch of the cleansed skin. 7. Insert the entire needle straight down into the fold of skin. 8. Push the plunger with your thumb until the syringe is empty. 9. Pull the needle straight out of your skin. 10. Enoxaparin injection prefilled syringes and graduated prefilled syringes are available with a system that shields the needle after injection. After you have completed your injection and removed the needle from your skin, firmly push down on the plunger. The  protective sleeve will automatically cover the needle and you will hear a click. The click means the needle is safely covered. 11. Place the syringe in the nearest needle box, also called a sharps container. If you do not have a sharps container, you can use a hard-sided plastic container with a secure lid, such as an empty laundry detergent bottle. What else do I need to know?  Do not use enoxaparin if:  You have allergies to heparin or pork products.  You have been diagnosed with a condition called thrombocytopenia.  Do not use the syringe or needle more than one time.  Use medicines only as directed by your health care provider.  Changes in medicines, supplements, diet, and illness can affect your anticoagulation therapy. Be sure to inform your health care provider of any of these changes.  It is important that you tell all of your health care providers and your dentist that you are taking an anticoagulant, especially if you are injured or plan to have any type of procedure.  While on anticoagulants, you will need to have blood tests done routinely as directed by your health care provider.  While using this medicine, avoid physical activities or sports that could result in a fall or cause injury.  Follow up with your laboratory test and health care provider appointments as directed. It is very important to keep your appointments. Not keeping appointments could result in a chronic or permanent injury, pain, or disability.  Before giving your medicine, you should make sure the injection is a clear and colorless or pale yellow solution. If your medicine becomes discolored or  if there are particles in the syringe, do not use it and notify your health care provider.  Keep your medicine safely stored at room temperature. Contact a health care provider if:  You develop any rashes on your skin.  You have large areas of bruising on your skin.  You have any worsening of the condition for  which you take Enoxaparin.  You develop a fever. Get help right away if:  You develop bleeding problems such as:  Bleeding from the gums or nose that does not stop quickly.  Vomiting blood or coughing up blood.  Blood in your urine.  Blood in your stool, or stool that has a dark, tarry, or coffee grounds appearance.  A cut that does not stop bleeding within 10 minutes. These symptoms may represent a serious problem that is an emergency. Do not wait to see if the symptoms will go away. Get medical help right away. Call your local emergency services (911 in the [place] Do not drive yourself to the hospital.  This information is not intended to replace advice given to you by your health care provider. Make sure you discuss any questions you have with your health care provider. Document Released: 04/16/2004 Document Revised: 02/20/2016 Document Reviewed: 11/30/2013 Elsevier Interactive Patient Education  2017 [employer].

## 2016-05-19 NOTE — Progress Notes (Signed)
I had the pleasure of meeting with Mrs. Bonnie Torres today for preoperative planning. She has now met with Drs. Borden and [place] regarding the renal mass and pelvic mass. She understands Dr. Lynne Logan plan and he is reviewed the risk and benefits of the procedure of partial versus total nephrectomy. Further, she has met with Dr. Denman George and understands plan for resection of the pelvic mass and ovaries. Today we discussed the large lipomatous mass in the upper abdomen and the plan would be for resection of that mass. We discussed the possibility of adjacent organs being adherent to the mass and possibly requiring resection including, but not limited to, a portion of the liver, gallbladder, stomach, pancreas, large and small intestine. We reviewed at length risk associated with resection of those organs including recurrence, involved margins, leaks and need for further surgery. She also understands the possibility that this may prove to be malignant requiring additional surgery and/or adjuvant treatment. She also understands that postoperatively she will likely wake up with a Foley catheter and possibly a nasogastric tube. We also discussed the possibility of a ostomy that may be permanent.  Finally, she understands the possibility of additional treatment based on final pathology of the intra-abdominal mass, kidney and pelvic organs. All questions were answered. She is tentatively scheduled for December 21. She was instructed to contact us if she has any questions. We also provided instruction regarding Lovenox injections that she will likely go home on in the postoperative setting.

## 2016-06-02 ENCOUNTER — Other Ambulatory Visit: Payer: Self-pay | Admitting: Physician Assistant

## 2016-06-12 ENCOUNTER — Encounter (HOSPITAL_COMMUNITY): Payer: Self-pay

## 2016-06-12 ENCOUNTER — Encounter (HOSPITAL_COMMUNITY)
Admission: RE | Admit: 2016-06-12 | Discharge: 2016-06-12 | Disposition: A | Discharge status: Home / Self Care | Payer: Medicare Other | Source: Ambulatory Visit | Attending: General Surgery | Admitting: General Surgery

## 2016-06-12 DIAGNOSIS — R1900 Intra-abdominal and pelvic swelling, mass and lump, unspecified site: Secondary | ICD-10-CM | POA: Insufficient documentation

## 2016-06-12 DIAGNOSIS — I10 Essential (primary) hypertension: Secondary | ICD-10-CM | POA: Insufficient documentation

## 2016-06-12 DIAGNOSIS — Z01818 Encounter for other preprocedural examination: Secondary | ICD-10-CM | POA: Insufficient documentation

## 2016-06-12 HISTORY — DX: Gout, unspecified: M10.9

## 2016-06-12 HISTORY — DX: Tachycardia, unspecified: R00.0

## 2016-06-12 LAB — URINALYSIS, ROUTINE W REFLEX MICROSCOPIC
BILIRUBIN URINE: NEGATIVE
Glucose, UA: NEGATIVE mg/dL
KETONES UR: NEGATIVE mg/dL
NITRITE: NEGATIVE
PH: 8 (ref 5.0–8.0)
Protein, ur: NEGATIVE mg/dL
SPECIFIC GRAVITY, URINE: 1.008 (ref 1.005–1.030)

## 2016-06-12 LAB — COMPREHENSIVE METABOLIC PANEL
ALBUMIN: 4.5 g/dL (ref 3.5–5.0)
ALK PHOS: 53 U/L (ref 38–126)
ALT: 16 U/L (ref 14–54)
AST: 19 U/L (ref 15–41)
Anion gap: 7 (ref 5–15)
BILIRUBIN TOTAL: 1.1 mg/dL (ref 0.3–1.2)
BUN: 9 mg/dL (ref 6–20)
CALCIUM: 9.5 mg/dL (ref 8.9–10.3)
CO2: 29 mmol/L (ref 22–32)
Chloride: 102 mmol/L (ref 101–111)
Creatinine, Ser: 1 mg/dL (ref 0.44–1.00)
GFR calc Af Amer: >60 mL/min (ref >60)
GFR calc non Af Amer: >60 mL/min (ref >60)
Glucose, Bld: 110 mg/dL — ABNORMAL HIGH (ref 65–99)
Potassium: 3.7 mmol/L (ref 3.5–5.1)
SODIUM: 138 mmol/L (ref 135–145)
TOTAL PROTEIN: 8.5 g/dL — AB (ref 6.5–8.1)

## 2016-06-12 LAB — CBC WITH DIFFERENTIAL/PLATELET
Basophils Absolute: 0.1 10*3/uL (ref 0.0–0.1)
Basophils Relative: 1 %
Eosinophils Absolute: 0.2 10*3/uL (ref 0.0–0.7)
Eosinophils Relative: 4 %
HCT: 40 % (ref 36.0–46.0)
HEMOGLOBIN: 14.7 g/dL (ref 12.0–15.0)
LYMPHS PCT: 45 %
Lymphs Abs: 2.3 10*3/uL (ref 0.7–4.0)
MCH: 27.3 pg (ref 26.0–34.0)
MCHC: 36.8 g/dL — ABNORMAL HIGH (ref 30.0–36.0)
MCV: 74.2 fL — ABNORMAL LOW (ref 78.0–100.0)
MONOS PCT: 6 %
Monocytes Absolute: 0.3 10*3/uL (ref 0.1–1.0)
NEUTROS PCT: 44 %
Neutro Abs: 2.2 10*3/uL (ref 1.7–7.7)
Platelets: 302 10*3/uL (ref 150–400)
RBC: 5.39 MIL/uL — AB (ref 3.87–5.11)
RDW: 13.1 % (ref 11.5–15.5)
WBC: 5.1 10*3/uL (ref 4.0–10.5)

## 2016-06-12 LAB — ABO/RH: ABO/RH(D): B POS

## 2016-06-12 NOTE — Pre-Procedure Instructions (Signed)
EKG done today. Spoke with "[facility]" Gyn/oncology ? Whether bowel prep needed, states Per Melissa Cross- no bowel prep needed , encourage Clear Liquids day before.

## 2016-06-12 NOTE — Consult Note (Signed)
[facility] Nurse ostomy consult note  [place] Nurse requested for preoperative stoma site marking by Dr. Rolanda Jay. DOS is Thursday, June 18, 2016.  Discussed surgical procedure and stoma creation with patient and family.  Explained role of the [place] nurse team.  Provided the patient with educational booklet and demonstrated 2-piece of pouching option.  Answered patient questions. It is noted that patient first hopes to have no ostomy and if one is necessary, that it is a temporary stoma. She knows no one with an ostomy and her fears center around odor.  Patient is taught that ostomy pouching systems are odor proof and waterproof, that she would be able to shower and bather in the manner she currently does with an ostomy. She is taught that skin barrier fit is a skill of her ostomy nurse team and that she will be odor-free in an ostomy pouching system that fits correctly. She is relieved by this information and indicated understanding.  Examined patient sitting and standing in order to place the marking in the patient's visual field, away from any creases or abdominal contour issues and within the rectus muscle.  Attempted to mark below the patient's waistband line, but due to rotund abdomen, she is unable to see an ostomy placed below the umbilicus and this would complicate self-care.   Marked for colostomy in the LUQ  6.5 cm to the left of the umbilicus and 3cm above the umbilicus.  Patient's abdomen cleansed with CHG wipes 2x at site marking, allowed to air dry prior to marking.Covered mark with thin film transparent dressing to preserve mark until date of surgery. Patient understand that even if mark and transparent dressing are removed prior to surgery, that the notation of this marking has been made in her record for Dr. Rolanda Jay.  Thank you for allowing me to meet this nice woman preoperatively and to mark her for a stoma in the event one is needed intraoperatively next Thursday. Maudie Flakes, MSN, RN,  [place], [facility]  Pager# [phone]

## 2016-06-12 NOTE — Progress Notes (Signed)
06-12-16 1510 Reported urinalysis viewable in Epic to [place] of Dr. Suzzanne Cloud office- Fax routed per [employer].

## 2016-06-12 NOTE — Patient Instructions (Addendum)
ISSABELLE SCHENKEL  06/12/2016   Your procedure is scheduled on:   Report to [facility] Main  Entrance take [facility]  elevators to 3rd floor to  [place] at AM.  Call this number if you have problems the morning of surgery [phone] Follow Bowel prep Instructions per MD.(Drink clear liquids plentiful day before)   CLEAR LIQUID DIET   Foods Allowed                                                                     Foods Excluded  Coffee and tea, regular and decaf                             liquids that you cannot  Plain Jell-O in any flavor                                             see through such as: Fruit ices (not with fruit pulp)                                     milk, soups, orange juice  Iced Popsicles                                    All solid food Carbonated beverages, regular and diet                                    Cranberry, grape and apple juices Sports drinks like Gatorade Lightly seasoned clear broth or consume(fat free) Sugar, honey syrup   _____________________________________________________________________    Remember: ONLY 1 PERSON MAY GO WITH YOU TO SHORT STAY TO GET  READY MORNING OF YOUR SURGERY.  Do not eat food or drink liquids :After Midnight.     Take these medicines the morning of surgery with A SIP OF WATER: Metoprolol. Reminder: Amlodipine- will be given on arrival in Short Stay). DO NOT TAKE ANY DIABETIC MEDICATIONS DAY OF YOUR SURGERY                               You may not have any metal on your body including hair pins and              piercings  Do not wear jewelry, make-up, lotions, powders or perfumes, deodorant             Do not wear nail polish.  Do not shave  48 hours prior to surgery.              Men may shave face and neck.   Do not bring valuables to the hospital. [facility] IS NOT  RESPONSIBLE   FOR VALUABLES.  Contacts, dentures or bridgework may not be worn into  surgery.  Leave suitcase in the car. After surgery it may be brought to your room.     Patients discharged the day of surgery will not be allowed to drive home.    Special Instructions: N/A              Please read over the following fact sheets you were given: _____________________________________________________________________             [facility] - Preparing for Surgery Before surgery, you can play an important role.  Because skin is not sterile, your skin needs to be as free of germs as possible.  You can reduce the number of germs on your skin by washing with CHG (chlorahexidine gluconate) soap before surgery.  CHG is an antiseptic cleaner which kills germs and bonds with the skin to continue killing germs even after washing. Please DO NOT use if you have an allergy to CHG or antibacterial soaps.  If your skin becomes reddened/irritated stop using the CHG and inform your nurse when you arrive at Short Stay. Do not shave (including legs and underarms) for at least 48 hours prior to the first CHG shower.  You may shave your face/neck. Please follow these instructions carefully:  1.  Shower with CHG Soap the night before surgery and the  morning of Surgery.  2.  If you choose to wash your hair, wash your hair first as usual with your  normal  shampoo.  3.  After you shampoo, rinse your hair and body thoroughly to remove the  shampoo.                           4.  Use CHG as you would any other liquid soap.  You can apply chg directly  to the skin and wash                       Gently with a scrungie or clean washcloth.  5.  Apply the CHG Soap to your body ONLY FROM THE NECK DOWN.   Do not use on face/ open                           Wound or open sores. Avoid contact with eyes, ears mouth and genitals (private parts).                       Wash face,  Genitals (private parts) with your normal soap.             6.  Wash thoroughly, paying special attention to the area where your surgery   will be performed.  7.  Thoroughly rinse your body with warm water from the neck down.  8.  DO NOT shower/wash with your normal soap after using and rinsing off  the CHG Soap.                9.  Pat yourself dry with a clean towel.            10.  Wear clean pajamas.            11.  Place clean sheets on your bed the night of your first shower and do not  sleep with pets. Day of Surgery : Do not apply any lotions/deodorants the  morning of surgery.  Please wear clean clothes to the hospital/surgery center.  FAILURE TO FOLLOW THESE INSTRUCTIONS MAY RESULT IN THE CANCELLATION OF YOUR SURGERY PATIENT SIGNATURE_________________________________  NURSE SIGNATURE__________________________________  ________________________________________________________________________  Adam Phenix  An incentive spirometer is a tool that can help keep your lungs clear and active. This tool measures how well you are filling your lungs with each breath. Taking long deep breaths may help reverse or decrease the chance of developing breathing (pulmonary) problems (especially infection) following:  A long period of time when you are unable to move or be active. BEFORE THE PROCEDURE   If the spirometer includes an indicator to show your best effort, your nurse or respiratory therapist will set it to a desired goal.  If possible, sit up straight or lean slightly forward. Try not to slouch.  Hold the incentive spirometer in an upright position. INSTRUCTIONS FOR USE  1. Sit on the edge of your bed if possible, or sit up as far as you can in bed or on a chair. 2. Hold the incentive spirometer in an upright position. 3. Breathe out normally. 4. Place the mouthpiece in your mouth and seal your lips tightly around it. 5. Breathe in slowly and as deeply as possible, raising the piston or the ball toward the top of the column. 6. Hold your breath for 3-5 seconds or for as long as possible. Allow the piston or  ball to fall to the bottom of the column. 7. Remove the mouthpiece from your mouth and breathe out normally. 8. Rest for a few seconds and repeat Steps 1 through 7 at least 10 times every 1-2 hours when you are awake. Take your time and take a few normal breaths between deep breaths. 9. The spirometer may include an indicator to show your best effort. Use the indicator as a goal to work toward during each repetition. 10. After each set of 10 deep breaths, practice coughing to be sure your lungs are clear. If you have an incision (the cut made at the time of surgery), support your incision when coughing by placing a pillow or rolled up towels firmly against it. Once you are able to get out of bed, walk around indoors and cough well. You may stop using the incentive spirometer when instructed by your caregiver.  RISKS AND COMPLICATIONS  Take your time so you do not get dizzy or light-headed.  If you are in pain, you may need to take or ask for pain medication before doing incentive spirometry. It is harder to take a deep breath if you are having pain. AFTER USE  Rest and breathe slowly and easily.  It can be helpful to keep track of a log of your progress. Your caregiver can provide you with a simple table to help with this. If you are using the spirometer at home, follow these instructions: [place] IF:   You are having difficultly using the spirometer.  You have trouble using the spirometer as often as instructed.  Your pain medication is not giving enough relief while using the spirometer.  You develop fever of 100.5 F (38.1 C) or higher. SEEK IMMEDIATE MEDICAL CARE IF:   You cough up bloody sputum that had not been present before.  You develop fever of 102 F (38.9 C) or greater.  You develop worsening pain at or near the incision site. MAKE SURE YOU:   Understand these instructions.  Will watch your condition.  Will get help right away if  you are not doing well  or get worse. Document Released: 10/26/2006 Document Revised: 09/07/2011 Document Reviewed: 12/27/2006 [facility] Patient Information 2014 [place], [place].   ________________________________________________________________________

## 2016-06-14 LAB — URINE CULTURE: Culture: >=100000 — AB

## 2016-06-16 ENCOUNTER — Telehealth: Payer: Self-pay | Admitting: Nurse Practitioner

## 2016-06-16 ENCOUNTER — Other Ambulatory Visit: Payer: Self-pay | Admitting: Gynecologic Oncology

## 2016-06-16 DIAGNOSIS — N390 Urinary tract infection, site not specified: Secondary | ICD-10-CM

## 2016-06-16 MED ORDER — SULFAMETHOXAZOLE-TRIMETHOPRIM 800-160 MG PO TABS: 1.0000 | ORAL_TABLET | Freq: Two times a day (BID) | ORAL | 0 refills | Status: DC

## 2016-06-16 NOTE — Telephone Encounter (Signed)
Per Lenna Sciara, Np, this Rn made multiple attempts to reach patient to inform her that Bactrim has been called in to pharmacy and to begin taking this medication immediately to treat UTI prior to surgery. Rn called pharmacy to see if she has picked up rx, she has not; same phone number on file at pharmacy. This RN attempted to reach patient through her emergency contact numbers, again no answer. Patient not answering phone and voicemail is full and cannot accept messages. NP aware.

## 2016-06-16 NOTE — Telephone Encounter (Signed)
Per Lenna Sciara, NP, patient informed that urine sample reflects UTI and ABX bactrim has been called to pharmacy. She denies symptoms such as urinary burning or frequency. She should begin immediately, taking 1 dose tonight and beginning BID dosing tomorrow. She verbalizes understanding and denies other questions or concerns at this time.

## 2016-06-17 NOTE — Anesthesia Preprocedure Evaluation (Addendum)
Anesthesia Evaluation  Patient identified by MRN, date of birth, ID band Patient awake    Reviewed: Allergy & Precautions, NPO status , Patient's Chart, lab work & pertinent test results  Airway Mallampati: II  TM Distance: >3 FB Neck ROM: Full    Dental  (+) Dental Advisory Given   Pulmonary neg pulmonary ROS,    breath sounds clear to auscultation       Cardiovascular hypertension, Pt. on medications and Pt. on home beta blockers  Rhythm:Regular Rate:Normal     Neuro/Psych negative neurological ROS     GI/Hepatic Neg liver ROS, Abdominal mass   Endo/Other  negative endocrine ROS  Renal/GU Right Renal mass     Musculoskeletal   Abdominal   Peds  Hematology negative hematology ROS (+)   Anesthesia Other Findings   Reproductive/Obstetrics                            Lab Results  Component Value Date   WBC 5.1 06/12/2016   HGB 14.7 06/12/2016   HCT 40.0 06/12/2016   MCV 74.2 (L) 06/12/2016   PLT 302 06/12/2016   Lab Results  Component Value Date   CREATININE 1.00 06/12/2016   BUN 9 06/12/2016   NA 138 06/12/2016   K 3.7 06/12/2016   CL 102 06/12/2016   CO2 29 06/12/2016    Anesthesia Physical Anesthesia Plan  ASA: III  Anesthesia Plan: General   Post-op Pain Management:    Induction: Intravenous  Airway Management Planned: Oral ETT  Additional Equipment: Arterial line  Intra-op Plan:   Post-operative Plan: Possible Post-op intubation/ventilation  Informed Consent: I have reviewed the patients History and Physical, chart, labs and discussed the procedure including the risks, benefits and alternatives for the proposed anesthesia with the patient or authorized representative who has indicated his/her understanding and acceptance.   Dental advisory given  Plan Discussed with:   Anesthesia Plan Comments:        Anesthesia Quick Evaluation

## 2016-06-18 ENCOUNTER — Inpatient Hospital Stay (HOSPITAL_COMMUNITY): Payer: Medicare Other | Admitting: Anesthesiology

## 2016-06-18 ENCOUNTER — Encounter (HOSPITAL_COMMUNITY): Payer: Self-pay | Admitting: *Deleted

## 2016-06-18 ENCOUNTER — Encounter (HOSPITAL_COMMUNITY)
Admission: RE | Disposition: A | Discharge status: Home Health | Payer: Self-pay | Source: Ambulatory Visit | Attending: General Surgery

## 2016-06-18 ENCOUNTER — Inpatient Hospital Stay (HOSPITAL_COMMUNITY)
Admission: RE | Admit: 2016-06-18 | Discharge: 2016-06-24 | DRG: 657 | Disposition: A | Discharge status: Home Health | Payer: Medicare Other | Source: Ambulatory Visit | Attending: General Surgery | Admitting: General Surgery

## 2016-06-18 DIAGNOSIS — M109 Gout, unspecified: Secondary | ICD-10-CM | POA: Diagnosis not present

## 2016-06-18 DIAGNOSIS — D49511 Neoplasm of unspecified behavior of right kidney: Principal | ICD-10-CM | POA: Diagnosis present

## 2016-06-18 DIAGNOSIS — E785 Hyperlipidemia, unspecified: Secondary | ICD-10-CM | POA: Diagnosis not present

## 2016-06-18 DIAGNOSIS — N2889 Other specified disorders of kidney and ureter: Secondary | ICD-10-CM | POA: Diagnosis present

## 2016-06-18 DIAGNOSIS — Z79899 Other long term (current) drug therapy: Secondary | ICD-10-CM | POA: Diagnosis not present

## 2016-06-18 DIAGNOSIS — N859 Noninflammatory disorder of uterus, unspecified: Secondary | ICD-10-CM

## 2016-06-18 DIAGNOSIS — D181 Lymphangioma, any site: Secondary | ICD-10-CM | POA: Diagnosis present

## 2016-06-18 DIAGNOSIS — I10 Essential (primary) hypertension: Secondary | ICD-10-CM | POA: Diagnosis not present

## 2016-06-18 DIAGNOSIS — D62 Acute posthemorrhagic anemia: Secondary | ICD-10-CM | POA: Diagnosis not present

## 2016-06-18 DIAGNOSIS — R34 Anuria and oliguria: Secondary | ICD-10-CM | POA: Diagnosis not present

## 2016-06-18 DIAGNOSIS — D252 Subserosal leiomyoma of uterus: Secondary | ICD-10-CM | POA: Diagnosis present

## 2016-06-18 DIAGNOSIS — N839 Noninflammatory disorder of ovary, fallopian tube and broad ligament, unspecified: Secondary | ICD-10-CM | POA: Diagnosis present

## 2016-06-18 DIAGNOSIS — N858 Other specified noninflammatory disorders of uterus: Secondary | ICD-10-CM

## 2016-06-18 DIAGNOSIS — E876 Hypokalemia: Secondary | ICD-10-CM | POA: Diagnosis present

## 2016-06-18 DIAGNOSIS — Z809 Family history of malignant neoplasm, unspecified: Secondary | ICD-10-CM | POA: Diagnosis not present

## 2016-06-18 DIAGNOSIS — D4101 Neoplasm of uncertain behavior of right kidney: Secondary | ICD-10-CM | POA: Diagnosis not present

## 2016-06-18 DIAGNOSIS — C480 Malignant neoplasm of retroperitoneum: Secondary | ICD-10-CM

## 2016-06-18 DIAGNOSIS — K650 Generalized (acute) peritonitis: Secondary | ICD-10-CM | POA: Diagnosis not present

## 2016-06-18 DIAGNOSIS — R1900 Intra-abdominal and pelvic swelling, mass and lump, unspecified site: Secondary | ICD-10-CM

## 2016-06-18 DIAGNOSIS — M7989 Other specified soft tissue disorders: Secondary | ICD-10-CM | POA: Diagnosis not present

## 2016-06-18 DIAGNOSIS — N039 Chronic nephritic syndrome with unspecified morphologic changes: Secondary | ICD-10-CM | POA: Diagnosis not present

## 2016-06-18 DIAGNOSIS — C641 Malignant neoplasm of right kidney, except renal pelvis: Secondary | ICD-10-CM | POA: Diagnosis not present

## 2016-06-18 DIAGNOSIS — K811 Chronic cholecystitis: Secondary | ICD-10-CM | POA: Diagnosis not present

## 2016-06-18 HISTORY — DX: Malignant neoplasm of retroperitoneum: C48.0

## 2016-06-18 HISTORY — PX: ABDOMINAL HYSTERECTOMY: SHX81

## 2016-06-18 HISTORY — PX: PARTIAL NEPHRECTOMY: SHX414

## 2016-06-18 HISTORY — PX: MASS EXCISION: SHX2000

## 2016-06-18 HISTORY — PX: SALPINGOOPHORECTOMY: SHX82

## 2016-06-18 LAB — CBC
HCT: 33.6 % — ABNORMAL LOW (ref 36.0–46.0)
Hemoglobin: 12.4 g/dL (ref 12.0–15.0)
MCH: 27.1 pg (ref 26.0–34.0)
MCHC: 36.9 g/dL — ABNORMAL HIGH (ref 30.0–36.0)
MCV: 73.2 fL — ABNORMAL LOW (ref 78.0–100.0)
Platelets: 256 10*3/uL (ref 150–400)
RBC: 4.59 MIL/uL (ref 3.87–5.11)
RDW: 13 % (ref 11.5–15.5)
WBC: 11.7 10*3/uL — ABNORMAL HIGH (ref 4.0–10.5)

## 2016-06-18 LAB — CREATININE, SERUM
CREATININE: 1.24 mg/dL — AB (ref 0.44–1.00)
GFR calc Af Amer: 54 mL/min — ABNORMAL LOW (ref >60)
GFR calc non Af Amer: 47 mL/min — ABNORMAL LOW (ref >60)

## 2016-06-18 LAB — POCT I-STAT 7, (LYTES, BLD GAS, ICA,H+H)
ACID-BASE DEFICIT: 3 mmol/L — AB (ref 0.0–2.0)
Bicarbonate: 22.2 mmol/L (ref 20.0–28.0)
Calcium, Ion: 1.18 mmol/L (ref 1.15–1.40)
HEMATOCRIT: 31 % — AB (ref 36.0–46.0)
HEMOGLOBIN: 10.5 g/dL — AB (ref 12.0–15.0)
O2 SAT: 98 %
PCO2 ART: 38 mmHg (ref 32.0–48.0)
PH ART: 7.374 (ref 7.350–7.450)
PO2 ART: 105 mmHg (ref 83.0–108.0)
POTASSIUM: 2.8 mmol/L — AB (ref 3.5–5.1)
Sodium: 139 mmol/L (ref 135–145)
TCO2: 23 mmol/L (ref 0–100)

## 2016-06-18 LAB — MRSA PCR SCREENING: MRSA BY PCR: NEGATIVE

## 2016-06-18 SURGERY — EXCISION MASS
Anesthesia: General | Laterality: Right

## 2016-06-18 MED ORDER — SODIUM CHLORIDE 0.9 % IV SOLN: 30.0000 meq | Freq: Once | INTRAVENOUS | Status: DC

## 2016-06-18 MED ORDER — HYDROCHLOROTHIAZIDE 12.5 MG PO CAPS
12.5000 mg | ORAL_CAPSULE | Freq: Every day | ORAL | Status: DC
Start: 2016-06-19 — End: 2016-06-24
  Administered 2016-06-19 – 2016-06-24 (×6): 12.5 mg via ORAL
  Filled 2016-06-18 (×6): qty 1

## 2016-06-18 MED ORDER — PROPOFOL 10 MG/ML IV BOLUS
INTRAVENOUS | Status: DC | PRN
  Administered 2016-06-18: 150 mg via INTRAVENOUS

## 2016-06-18 MED ORDER — OLMESARTAN-AMLODIPINE-HCTZ 20-5-12.5 MG PO TABS: 1.0000 | ORAL_TABLET | Freq: Every day | ORAL | Status: DC

## 2016-06-18 MED ORDER — EPHEDRINE 5 MG/ML INJ
INTRAVENOUS | Status: AC
  Filled 2016-06-18: qty 10

## 2016-06-18 MED ORDER — PHENYLEPHRINE 40 MCG/ML (10ML) SYRINGE FOR IV PUSH (FOR BLOOD PRESSURE SUPPORT)
PREFILLED_SYRINGE | INTRAVENOUS | Status: AC
  Filled 2016-06-18: qty 10

## 2016-06-18 MED ORDER — SCOPOLAMINE 1 MG/3DAYS TD PT72
MEDICATED_PATCH | TRANSDERMAL | Status: AC
  Filled 2016-06-18: qty 1

## 2016-06-18 MED ORDER — ALBUMIN HUMAN 5 % IV SOLN
INTRAVENOUS | Status: DC | PRN
  Administered 2016-06-18 (×2): via INTRAVENOUS

## 2016-06-18 MED ORDER — HYDROMORPHONE HCL 1 MG/ML IJ SOLN
0.2500 mg | INTRAMUSCULAR | Status: DC | PRN
  Administered 2016-06-18 (×2): 0.5 mg via INTRAVENOUS

## 2016-06-18 MED ORDER — DEXAMETHASONE SODIUM PHOSPHATE 10 MG/ML IJ SOLN
INTRAMUSCULAR | Status: AC
  Filled 2016-06-18: qty 1

## 2016-06-18 MED ORDER — PROMETHAZINE HCL 25 MG/ML IJ SOLN
6.2500 mg | INTRAMUSCULAR | Status: DC | PRN
  Administered 2016-06-18: 6.25 mg via INTRAVENOUS

## 2016-06-18 MED ORDER — POTASSIUM CHLORIDE CRYS ER 20 MEQ PO TBCR
20.0000 meq | EXTENDED_RELEASE_TABLET | Freq: Two times a day (BID) | ORAL | Status: DC
  Administered 2016-06-18 – 2016-06-24 (×12): 20 meq via ORAL
  Filled 2016-06-18 (×12): qty 1

## 2016-06-18 MED ORDER — CEFAZOLIN SODIUM-DEXTROSE 2-4 GM/100ML-% IV SOLN
2.0000 g | INTRAVENOUS | Status: AC
  Administered 2016-06-18: 2 g via INTRAVENOUS
  Filled 2016-06-18: qty 100

## 2016-06-18 MED ORDER — ONDANSETRON HCL 4 MG/2ML IJ SOLN
4.0000 mg | Freq: Four times a day (QID) | INTRAMUSCULAR | Status: DC | PRN
  Administered 2016-06-18: 4 mg via INTRAVENOUS
  Filled 2016-06-18: qty 2

## 2016-06-18 MED ORDER — PHENYLEPHRINE HCL 10 MG/ML IJ SOLN
INTRAVENOUS | Status: DC | PRN
  Administered 2016-06-18: 25 ug/min via INTRAVENOUS

## 2016-06-18 MED ORDER — HYDRALAZINE HCL 20 MG/ML IJ SOLN: 10.0000 mg | INTRAMUSCULAR | Status: DC | PRN

## 2016-06-18 MED ORDER — PROPOFOL 10 MG/ML IV BOLUS
INTRAVENOUS | Status: AC
  Filled 2016-06-18: qty 20

## 2016-06-18 MED ORDER — PANTOPRAZOLE SODIUM 40 MG IV SOLR
40.0000 mg | Freq: Every day | INTRAVENOUS | Status: DC
  Administered 2016-06-18 – 2016-06-23 (×6): 40 mg via INTRAVENOUS
  Filled 2016-06-18 (×5): qty 40

## 2016-06-18 MED ORDER — ONDANSETRON HCL 4 MG/2ML IJ SOLN
INTRAMUSCULAR | Status: DC | PRN
  Administered 2016-06-18: 4 mg via INTRAVENOUS

## 2016-06-18 MED ORDER — DEXAMETHASONE SODIUM PHOSPHATE 10 MG/ML IJ SOLN
INTRAMUSCULAR | Status: DC | PRN
  Administered 2016-06-18: 10 mg via INTRAVENOUS

## 2016-06-18 MED ORDER — DIPHENHYDRAMINE HCL 50 MG/ML IJ SOLN: 12.5000 mg | Freq: Four times a day (QID) | INTRAMUSCULAR | Status: DC | PRN

## 2016-06-18 MED ORDER — SUCCINYLCHOLINE CHLORIDE 200 MG/10ML IV SOSY
PREFILLED_SYRINGE | INTRAVENOUS | Status: AC
  Filled 2016-06-18: qty 10

## 2016-06-18 MED ORDER — MORPHINE SULFATE 2 MG/ML IV SOLN
INTRAVENOUS | Status: DC
Start: 2016-06-18 — End: 2016-06-24
  Administered 2016-06-18: 14:00:00 via INTRAVENOUS
  Administered 2016-06-18: 2 mg via INTRAVENOUS
  Administered 2016-06-19: 18 mg via INTRAVENOUS
  Administered 2016-06-19: 2 mg via INTRAVENOUS
  Administered 2016-06-20: 09:00:00 via INTRAVENOUS
  Administered 2016-06-20: 7.59 mg via INTRAVENOUS
  Administered 2016-06-20: 8 mg via INTRAVENOUS
  Administered 2016-06-20: 9.1 mg via INTRAVENOUS
  Administered 2016-06-20: 10.09 mg via INTRAVENOUS
  Administered 2016-06-20: 15.95 mg via INTRAVENOUS
  Administered 2016-06-20: 10.99 mg via INTRAVENOUS
  Administered 2016-06-21: 9.59 mg via INTRAVENOUS
  Administered 2016-06-21: 5.35 mg via INTRAVENOUS
  Administered 2016-06-21: 2 mg via INTRAVENOUS
  Administered 2016-06-21: 7.13 mg via INTRAVENOUS
  Administered 2016-06-21: 7.09 mg via INTRAVENOUS
  Administered 2016-06-21: 6.64 mg via INTRAVENOUS
  Administered 2016-06-21: 7.13 mg via INTRAVENOUS
  Administered 2016-06-22: 8.17 mg via INTRAVENOUS
  Administered 2016-06-22: 6.26 mg via INTRAVENOUS
  Administered 2016-06-22: 5.83 mg via INTRAVENOUS
  Administered 2016-06-22: 6.83 mg via INTRAVENOUS
  Administered 2016-06-22: 13:00:00 via INTRAVENOUS
  Administered 2016-06-23: 7.98 mg via INTRAVENOUS
  Administered 2016-06-23: 16:00:00 via INTRAVENOUS
  Administered 2016-06-23: 9.97 mg via INTRAVENOUS
  Administered 2016-06-23: 7.55 mg via INTRAVENOUS
  Administered 2016-06-23: 6.19 mg via INTRAVENOUS
  Administered 2016-06-23: 7.98 mg via INTRAVENOUS
  Administered 2016-06-24: 3.45 mg via INTRAVENOUS
  Administered 2016-06-24: 4.86 mg via INTRAVENOUS
  Filled 2016-06-18 (×6): qty 25

## 2016-06-18 MED ORDER — SUCCINYLCHOLINE CHLORIDE 200 MG/10ML IV SOSY
PREFILLED_SYRINGE | INTRAVENOUS | Status: DC | PRN
  Administered 2016-06-18: 120 mg via INTRAVENOUS

## 2016-06-18 MED ORDER — SODIUM CHLORIDE 0.9 % IJ SOLN
INTRAMUSCULAR | Status: AC
  Filled 2016-06-18: qty 20

## 2016-06-18 MED ORDER — MIDAZOLAM HCL 5 MG/5ML IJ SOLN
INTRAMUSCULAR | Status: DC | PRN
  Administered 2016-06-18: 2 mg via INTRAVENOUS

## 2016-06-18 MED ORDER — DIPHENHYDRAMINE HCL 12.5 MG/5ML PO ELIX
12.5000 mg | ORAL_SOLUTION | Freq: Four times a day (QID) | ORAL | Status: DC | PRN
  Filled 2016-06-18: qty 5

## 2016-06-18 MED ORDER — KETAMINE HCL 10 MG/ML IJ SOLN
INTRAMUSCULAR | Status: AC
  Filled 2016-06-18: qty 1

## 2016-06-18 MED ORDER — ONDANSETRON HCL 4 MG/2ML IJ SOLN
4.0000 mg | Freq: Four times a day (QID) | INTRAMUSCULAR | Status: DC | PRN
  Administered 2016-06-21: 4 mg via INTRAVENOUS
  Filled 2016-06-18: qty 2

## 2016-06-18 MED ORDER — PHENYLEPHRINE 40 MCG/ML (10ML) SYRINGE FOR IV PUSH (FOR BLOOD PRESSURE SUPPORT)
PREFILLED_SYRINGE | INTRAVENOUS | Status: DC | PRN
  Administered 2016-06-18 (×2): 40 ug via INTRAVENOUS
  Administered 2016-06-18 (×5): 80 ug via INTRAVENOUS

## 2016-06-18 MED ORDER — ONDANSETRON 4 MG PO TBDP
4.0000 mg | ORAL_TABLET | Freq: Four times a day (QID) | ORAL | Status: DC | PRN
  Administered 2016-06-24: 4 mg via ORAL
  Filled 2016-06-18: qty 1

## 2016-06-18 MED ORDER — KETAMINE HCL 10 MG/ML IJ SOLN
INTRAMUSCULAR | Status: DC | PRN
  Administered 2016-06-18 (×2): 10 mg via INTRAVENOUS
  Administered 2016-06-18: 40 mg via INTRAVENOUS
  Administered 2016-06-18 (×2): 10 mg via INTRAVENOUS

## 2016-06-18 MED ORDER — LIDOCAINE 2% (20 MG/ML) 5 ML SYRINGE
INTRAMUSCULAR | Status: AC
  Filled 2016-06-18: qty 5

## 2016-06-18 MED ORDER — CEFAZOLIN SODIUM-DEXTROSE 2-4 GM/100ML-% IV SOLN
INTRAVENOUS | Status: AC
  Filled 2016-06-18: qty 100

## 2016-06-18 MED ORDER — IRBESARTAN 150 MG PO TABS
150.0000 mg | ORAL_TABLET | Freq: Every day | ORAL | Status: DC
  Administered 2016-06-19 – 2016-06-24 (×6): 150 mg via ORAL
  Filled 2016-06-18 (×6): qty 1

## 2016-06-18 MED ORDER — POTASSIUM CHLORIDE 10 MEQ/100ML IV SOLN
10.0000 meq | INTRAVENOUS | Status: AC
  Administered 2016-06-18 (×3): 10 meq via INTRAVENOUS
  Filled 2016-06-18 (×3): qty 100

## 2016-06-18 MED ORDER — SCOPOLAMINE 1 MG/3DAYS TD PT72
MEDICATED_PATCH | TRANSDERMAL | Status: DC | PRN
  Administered 2016-06-18: 1 via TRANSDERMAL

## 2016-06-18 MED ORDER — EPHEDRINE SULFATE 50 MG/ML IJ SOLN: INTRAMUSCULAR | Status: DC | PRN

## 2016-06-18 MED ORDER — PHENYLEPHRINE HCL 10 MG/ML IJ SOLN: INTRAMUSCULAR | Status: DC | PRN

## 2016-06-18 MED ORDER — MORPHINE SULFATE 2 MG/ML IV SOLN: 0.5000 mg | INTRAVENOUS | Status: DC

## 2016-06-18 MED ORDER — SUGAMMADEX SODIUM 200 MG/2ML IV SOLN
INTRAVENOUS | Status: AC
Start: 2016-06-18 — End: 2016-06-18
  Filled 2016-06-18: qty 2

## 2016-06-18 MED ORDER — HYDROMORPHONE HCL 2 MG/ML IJ SOLN
INTRAMUSCULAR | Status: AC
  Filled 2016-06-18: qty 1

## 2016-06-18 MED ORDER — ROCURONIUM BROMIDE 50 MG/5ML IV SOSY
PREFILLED_SYRINGE | INTRAVENOUS | Status: AC
  Filled 2016-06-18: qty 5

## 2016-06-18 MED ORDER — SODIUM CHLORIDE 0.9% FLUSH: 9.0000 mL | INTRAVENOUS | Status: DC | PRN

## 2016-06-18 MED ORDER — ONDANSETRON HCL 4 MG/2ML IJ SOLN
INTRAMUSCULAR | Status: AC
  Filled 2016-06-18: qty 2

## 2016-06-18 MED ORDER — NALOXONE HCL 0.4 MG/ML IJ SOLN: 0.4000 mg | INTRAMUSCULAR | Status: DC | PRN

## 2016-06-18 MED ORDER — CEFAZOLIN SODIUM-DEXTROSE 2-4 GM/100ML-% IV SOLN
2.0000 g | Freq: Three times a day (TID) | INTRAVENOUS | Status: AC
  Administered 2016-06-18: 2 g via INTRAVENOUS
  Filled 2016-06-18: qty 100

## 2016-06-18 MED ORDER — BUPIVACAINE LIPOSOME 1.3 % IJ SUSP
20.0000 mL | Freq: Once | INTRAMUSCULAR | Status: AC
  Administered 2016-06-18: 20 mL
  Filled 2016-06-18: qty 20

## 2016-06-18 MED ORDER — MIDAZOLAM HCL 2 MG/2ML IJ SOLN
INTRAMUSCULAR | Status: AC
  Filled 2016-06-18: qty 2

## 2016-06-18 MED ORDER — ROCURONIUM BROMIDE 10 MG/ML (PF) SYRINGE
PREFILLED_SYRINGE | INTRAVENOUS | Status: DC | PRN
  Administered 2016-06-18: 20 mg via INTRAVENOUS
  Administered 2016-06-18: 10 mg via INTRAVENOUS
  Administered 2016-06-18: 50 mg via INTRAVENOUS
  Administered 2016-06-18 (×2): 10 mg via INTRAVENOUS

## 2016-06-18 MED ORDER — AMLODIPINE BESYLATE 5 MG PO TABS
5.0000 mg | ORAL_TABLET | Freq: Every day | ORAL | Status: DC
  Administered 2016-06-18: 5 mg via ORAL
  Filled 2016-06-18: qty 1

## 2016-06-18 MED ORDER — LACTATED RINGERS IV SOLN
INTRAVENOUS | Status: DC | PRN
  Administered 2016-06-18: 07:00:00 via INTRAVENOUS

## 2016-06-18 MED ORDER — LIDOCAINE HCL (CARDIAC) 20 MG/ML IV SOLN
INTRAVENOUS | Status: DC | PRN
  Administered 2016-06-18: 100 mg via INTRAVENOUS

## 2016-06-18 MED ORDER — BUPIVACAINE HCL 0.25 % IJ SOLN
INTRAMUSCULAR | Status: DC | PRN
  Administered 2016-06-18: 40 mL

## 2016-06-18 MED ORDER — FENTANYL CITRATE (PF) 100 MCG/2ML IJ SOLN
INTRAMUSCULAR | Status: AC
  Filled 2016-06-18: qty 4

## 2016-06-18 MED ORDER — LACTATED RINGERS IV SOLN: INTRAVENOUS | Status: DC

## 2016-06-18 MED ORDER — HYDROMORPHONE HCL 1 MG/ML IJ SOLN
INTRAMUSCULAR | Status: AC
Start: 2016-06-18 — End: 2016-06-19
  Filled 2016-06-18: qty 1

## 2016-06-18 MED ORDER — ALBUMIN HUMAN 5 % IV SOLN
INTRAVENOUS | Status: AC
  Filled 2016-06-18: qty 250

## 2016-06-18 MED ORDER — KCL IN DEXTROSE-NACL 20-5-0.45 MEQ/L-%-% IV SOLN
INTRAVENOUS | Status: DC
  Administered 2016-06-18: 15:00:00 via INTRAVENOUS
  Filled 2016-06-18: qty 1000

## 2016-06-18 MED ORDER — PROMETHAZINE HCL 25 MG/ML IJ SOLN
INTRAMUSCULAR | Status: AC
  Filled 2016-06-18: qty 1

## 2016-06-18 MED ORDER — EPHEDRINE SULFATE-NACL 50-0.9 MG/10ML-% IV SOSY
PREFILLED_SYRINGE | INTRAVENOUS | Status: DC | PRN
  Administered 2016-06-18: 10 mg via INTRAVENOUS
  Administered 2016-06-18 (×4): 5 mg via INTRAVENOUS

## 2016-06-18 MED ORDER — METOPROLOL TARTRATE 50 MG PO TABS
100.0000 mg | ORAL_TABLET | Freq: Two times a day (BID) | ORAL | Status: DC
  Administered 2016-06-19 – 2016-06-24 (×11): 100 mg via ORAL
  Filled 2016-06-18 (×3): qty 2
  Filled 2016-06-18 (×2): qty 4
  Filled 2016-06-18 (×4): qty 2
  Filled 2016-06-18: qty 4
  Filled 2016-06-18: qty 2

## 2016-06-18 MED ORDER — FENTANYL CITRATE (PF) 100 MCG/2ML IJ SOLN
INTRAMUSCULAR | Status: DC | PRN
  Administered 2016-06-18 (×4): 50 ug via INTRAVENOUS

## 2016-06-18 MED ORDER — SODIUM CHLORIDE 0.9 % IJ SOLN
INTRAMUSCULAR | Status: AC
  Filled 2016-06-18: qty 10

## 2016-06-18 MED ORDER — BUPIVACAINE HCL (PF) 0.25 % IJ SOLN
INTRAMUSCULAR | Status: AC
  Filled 2016-06-18: qty 60

## 2016-06-18 MED ORDER — PHENYLEPHRINE HCL 10 MG/ML IJ SOLN
INTRAMUSCULAR | Status: AC
  Filled 2016-06-18: qty 2

## 2016-06-18 MED ORDER — HYDROMORPHONE HCL 1 MG/ML IJ SOLN
INTRAMUSCULAR | Status: DC | PRN
  Administered 2016-06-18: .4 mg via INTRAVENOUS
  Administered 2016-06-18: .2 mg via INTRAVENOUS

## 2016-06-18 MED ORDER — SODIUM CHLORIDE 0.9 % IJ SOLN
INTRAMUSCULAR | Status: DC | PRN
  Administered 2016-06-18: 40 mL via INTRAVENOUS

## 2016-06-18 SURGICAL SUPPLY — 124 items
APPLIER CLIP 13 LRG OPEN (CLIP) ×4
APPLIER CLIP 9.375 MED OPEN (MISCELLANEOUS) ×4
ATTRACTOMAT 16X20 MAGNETIC DRP (DRAPES) ×4 IMPLANT
BAG ISOLATION DRAPE 18X18 (DRAPES) ×3 IMPLANT
BENZOIN TINCTURE PRP APPL 2/3 (GAUZE/BANDAGES/DRESSINGS) IMPLANT
BLADE EXTENDED COATED 6.5IN (ELECTRODE) ×4 IMPLANT
BLADE SURG 15 STRL LF DISP TIS (BLADE) IMPLANT
BLADE SURG 15 STRL SS (BLADE)
BLADE SURG SZ20 CARB STEEL (BLADE) IMPLANT
CHLORAPREP W/TINT 10.5 ML (MISCELLANEOUS) ×4 IMPLANT
CHLORAPREP W/TINT 26ML (MISCELLANEOUS) ×4 IMPLANT
CLIP APPLIE 13 LRG OPEN (CLIP) ×3 IMPLANT
CLIP APPLIE 9.375 MED OPEN (MISCELLANEOUS) ×3 IMPLANT
CLIP LIGATING HEM O LOK PURPLE (MISCELLANEOUS) ×4 IMPLANT
CLIP LIGATING HEMO O LOK GREEN (MISCELLANEOUS) ×4 IMPLANT
CLIP SUT LAPRA TY ABSORB (SUTURE) IMPLANT
CLIP TI LARGE 6 (CLIP) ×4 IMPLANT
CLIP TI MEDIUM 6 (CLIP) ×4 IMPLANT
CLIP TI MEDIUM LARGE 6 (CLIP) ×20 IMPLANT
CLIP TI WIDE RED SMALL 6 (CLIP) IMPLANT
CONT SPEC 4OZ CLIKSEAL STRL BL (MISCELLANEOUS) ×4 IMPLANT
COVER SURGICAL LIGHT HANDLE (MISCELLANEOUS) ×4 IMPLANT
DECANTER SPIKE VIAL GLASS SM (MISCELLANEOUS) IMPLANT
DERMABOND ADVANCED (GAUZE/BANDAGES/DRESSINGS) ×1
DERMABOND ADVANCED .7 DNX12 (GAUZE/BANDAGES/DRESSINGS) ×3 IMPLANT
DISSECTOR ROUND CHERRY 3/8 STR (MISCELLANEOUS) ×4 IMPLANT
DRAIN CHANNEL 15F RND FF 3/16 (WOUND CARE) ×4 IMPLANT
DRAIN JACKSON PRT FLT 10 (DRAIN) IMPLANT
DRAPE INCISE IOBAN 66X45 STRL (DRAPES) ×4 IMPLANT
DRAPE ISOLATION BAG 18X18 (DRAPES) ×1
DRAPE LAPAROSCOPIC ABDOMINAL (DRAPES) IMPLANT
DRAPE LAPAROTOMY T 102X78X121 (DRAPES) IMPLANT
DRAPE LAPAROTOMY TRNSV 102X78 (DRAPE) IMPLANT
DRAPE ORTHO SPLIT 87X125 STRL (DRAPES) IMPLANT
DRAPE SLUSH/WARMER DISC (DRAPES) ×4 IMPLANT
DRAPE SPLIT 77X100IN (DRAPES) IMPLANT
DRAPE U-SHAPE 47X51 STRL (DRAPES) IMPLANT
DRAPE UTILITY XL STRL (DRAPES) IMPLANT
DRAPE WARM FLUID 44X44 (DRAPE) IMPLANT
DRSG OPSITE POSTOP 4X10 (GAUZE/BANDAGES/DRESSINGS) IMPLANT
DRSG OPSITE POSTOP 4X12 (GAUZE/BANDAGES/DRESSINGS) ×4 IMPLANT
DRSG OPSITE POSTOP 4X8 (GAUZE/BANDAGES/DRESSINGS) ×4 IMPLANT
DRSG TEGADERM 4X4.75 (GAUZE/BANDAGES/DRESSINGS) ×4 IMPLANT
ELECT COATED BLADE 2.86 ST (ELECTRODE) IMPLANT
ELECT PENCIL ROCKER SW 15FT (MISCELLANEOUS) ×4 IMPLANT
ELECT REM PT RETURN 9FT ADLT (ELECTROSURGICAL) ×8
ELECTRODE REM PT RTRN 9FT ADLT (ELECTROSURGICAL) ×6 IMPLANT
EVACUATOR SILICONE 100CC (DRAIN) IMPLANT
GAUZE SPONGE 4X4 12PLY STRL (GAUZE/BANDAGES/DRESSINGS) IMPLANT
GAUZE SPONGE 4X4 16PLY XRAY LF (GAUZE/BANDAGES/DRESSINGS) ×4 IMPLANT
GLOVE BIO SURGEON STRL SZ 6 (GLOVE) ×8 IMPLANT
GLOVE BIO SURGEON STRL SZ 6.5 (GLOVE) ×4 IMPLANT
GLOVE BIOGEL M STRL SZ7.5 (GLOVE) ×4 IMPLANT
GLOVE BIOGEL PI IND STRL 7.5 (GLOVE) ×3 IMPLANT
GLOVE BIOGEL PI INDICATOR 7.5 (GLOVE) ×1
GLOVE SURG SS PI 7.5 STRL IVOR (GLOVE) ×4 IMPLANT
GOWN STRL REUS W/ TWL LRG LVL3 (GOWN DISPOSABLE) ×9 IMPLANT
GOWN STRL REUS W/TWL LRG LVL3 (GOWN DISPOSABLE) ×31 IMPLANT
HEMOSTAT ARISTA ABSORB 3G PWDR (MISCELLANEOUS) IMPLANT
HEMOSTAT SURGICEL 4X8 (HEMOSTASIS) ×8 IMPLANT
IMMOBILIZER KNEE 20 (SOFTGOODS)
IMMOBILIZER KNEE 20 THIGH 36 (SOFTGOODS) IMPLANT
KIT BASIN OR (CUSTOM PROCEDURE TRAY) ×8 IMPLANT
KIT ROOM TURNOVER WLOR (KITS) IMPLANT
LIGASURE 7.4 SM JAW OPEN (ELECTROSURGICAL) ×4 IMPLANT
LIGASURE IMPACT 36 18CM CVD LR (INSTRUMENTS) ×4 IMPLANT
LOOP VESSEL MAXI BLUE (MISCELLANEOUS) ×4 IMPLANT
LOOP VESSEL MINI RED (MISCELLANEOUS) IMPLANT
NEEDLE HYPO 22GX1.5 SAFETY (NEEDLE) IMPLANT
NS IRRIG 1000ML POUR BTL (IV SOLUTION) IMPLANT
PACK GENERAL/GYN (CUSTOM PROCEDURE TRAY) ×4 IMPLANT
PACK TOTAL JOINT (CUSTOM PROCEDURE TRAY) IMPLANT
PENCIL BUTTON HOLSTER BLD 10FT (ELECTRODE) ×4 IMPLANT
POSITIONER SURGICAL ARM (MISCELLANEOUS) ×4 IMPLANT
SHEET LAVH (DRAPES) ×4 IMPLANT
SPONGE LAP 18X18 X RAY DECT (DISPOSABLE) ×32 IMPLANT
STAPLER VISISTAT 35W (STAPLE) IMPLANT
STOCKINETTE 6  STRL (DRAPES)
STOCKINETTE 6 STRL (DRAPES) IMPLANT
STRIP CLOSURE SKIN 1/2X4 (GAUZE/BANDAGES/DRESSINGS) IMPLANT
SURGIFLO W/THROMBIN 8M KIT (HEMOSTASIS) ×8 IMPLANT
SUT ETHILON 2 0 PS N (SUTURE) IMPLANT
SUT ETHILON 3 0 PS 1 (SUTURE) ×4 IMPLANT
SUT MNCRL AB 3-0 PS2 18 (SUTURE) IMPLANT
SUT MNCRL AB 4-0 PS2 18 (SUTURE) ×8 IMPLANT
SUT MON AB 2-0 SH 27 (SUTURE)
SUT MON AB 2-0 SH27 (SUTURE) IMPLANT
SUT PDS AB 1 TP1 96 (SUTURE) ×8 IMPLANT
SUT PROLENE 5 0 CC 1 (SUTURE) IMPLANT
SUT SILK 0 (SUTURE) ×1
SUT SILK 0 30XBRD TIE 6 (SUTURE) ×3 IMPLANT
SUT SILK 2 0 (SUTURE) ×1
SUT SILK 2-0 30XBRD TIE 12 (SUTURE) ×3 IMPLANT
SUT SILK 3 0 SH 30 (SUTURE) ×8 IMPLANT
SUT V-LOC BARB 180 2/0GR6 GS22 (SUTURE) ×8
SUT VIC AB 0 CT1 36 (SUTURE) ×20 IMPLANT
SUT VIC AB 2-0 CT1 36 (SUTURE) ×8 IMPLANT
SUT VIC AB 2-0 CT2 27 (SUTURE) ×24 IMPLANT
SUT VIC AB 2-0 SH 18 (SUTURE) IMPLANT
SUT VIC AB 2-0 SH 27 (SUTURE)
SUT VIC AB 2-0 SH 27X BRD (SUTURE) IMPLANT
SUT VIC AB 3-0 CTX 36 (SUTURE) ×12 IMPLANT
SUT VIC AB 3-0 SH 18 (SUTURE) IMPLANT
SUT VIC AB 3-0 SH 27 (SUTURE) ×2
SUT VIC AB 3-0 SH 27X BRD (SUTURE) ×6 IMPLANT
SUT VICRYL 2 0 18  UND BR (SUTURE) ×1
SUT VICRYL 2 0 18 UND BR (SUTURE) ×3 IMPLANT
SUT VLOC 180 0 6IN GS21 (SUTURE) ×4 IMPLANT
SUT VLOC BARB 180 ABS3/0GR12 (SUTURE) ×4
SUTURE V-LC BRB 180 2/0GR6GS22 (SUTURE) ×6 IMPLANT
SUTURE VLOC BRB 180 ABS3/0GR12 (SUTURE) ×3 IMPLANT
SYR 10ML LL (SYRINGE) IMPLANT
SYR 30ML LL (SYRINGE) IMPLANT
SYR BULB 3OZ (MISCELLANEOUS) IMPLANT
SYR CONTROL 10ML LL (SYRINGE) IMPLANT
SYRINGE 35CC LL (MISCELLANEOUS) ×8 IMPLANT
TAPE CLOTH SURG 4X10 WHT LF (GAUZE/BANDAGES/DRESSINGS) IMPLANT
TOWEL OR 17X26 10 PK STRL BLUE (TOWEL DISPOSABLE) ×8 IMPLANT
TOWEL OR NON WOVEN STRL DISP B (DISPOSABLE) ×8 IMPLANT
TRAY FOLEY W/METER SILVER 16FR (SET/KITS/TRAYS/PACK) ×4 IMPLANT
TUBING CONNECTING 10 (TUBING) IMPLANT
VESSEL LOOPS MINI RED (MISCELLANEOUS)
WATER STERILE IRR 1500ML POUR (IV SOLUTION) IMPLANT
YANKAUER SUCT BULB TIP NO VENT (SUCTIONS) IMPLANT

## 2016-06-18 NOTE — Anesthesia Procedure Notes (Signed)
Procedure Name: Intubation Date/Time: 06/18/2016 7:34 AM Performed by: Carleene Cooper A Pre-anesthesia Checklist: Patient identified, Timeout performed, Emergency Drugs available, Suction available and Patient being monitored Patient Re-evaluated:Patient Re-evaluated prior to inductionOxygen Delivery Method: Circle system utilized Preoxygenation: Pre-oxygenation with 100% oxygen Intubation Type: IV induction Ventilation: Mask ventilation without difficulty Laryngoscope Size: Miller and 2 Grade View: Grade II Tube type: Subglottic suction tube Tube size: 7.0 mm Number of attempts: 3 Airway Equipment and Method: Stylet Placement Confirmation: ETT inserted through vocal cords under direct vision,  positive ETCO2 and breath sounds checked- equal and bilateral Secured at: 21 cm Tube secured with: Tape Dental Injury: Teeth and Oropharynx as per pre-operative assessment  Comments: DL X 1 by CRNA with MAC 3. Grade 2 view. - ETCO2. ETT removed. Head repositioned. VSS. Dl x 1 with MAC 3. Grade 2 view. - ETCO2. Pt masked. Sats 99%. DL X 1 with Mil 2 by Dr. Ola Spurr. Grade 2. ATOI. BBS= + ETCO2. ETT secured.

## 2016-06-18 NOTE — Op Note (Signed)
Pre-operative Diagnosis: Intraperitoneal Mass     Right Renal Mass     Uterine/Ovarian Masses  Procedure:    Exploratory Laparotomy              Retroperitoneal Exploration              Resection of Retroperitoneal Mass              Ligation of Chyla Cisterna                         Cholecystectomy              Mobilization of Hepatic Flexture  Post-operative Diagnosis:  Retroperitoneal Mass      Right Renal Mass     Uterine/Ovarian Masses   Surgeon:   Malachi Bonds, MD  Co-Surgeons:  Raynelle Bring, MD                         Bonnie ROSSI, MD  Anesthesia:  General Endotracheal  Indication for Procedure: The patient is very pleasant 60 year old lady who initially presented with symptoms of hirsutism and during that workup imaging was obtained identifying 3 distinct masses. The first being in the upper abdomen that was biopsy-proven lipomatous mass consistent with atypical lipomatous lesion or low-grade liposarcoma the second mass involved the right kidney and was highly suspicious for renal cell carcinoma and the third area of concern involved the uterus and bilateral ovaries. Decision was made to move forward with a combined procedure for resection of the upper abdominal mass, partial right nephrectomy and total abdominal hysterectomy bilateral salpingo-oophorectomy.  Operative Findings: Large lipomatous mass arising from the retroperitoneum communicating with the chyla cysterna resulting in chyle-filled mass with large channels of chyle filling the psuedocapsule of the tumor.  No evidence of intraperitoneal carcinomatosis.  Large bulky uterus and palpable mass on right kidney.  Procedure in Detail: After informed consent was obtained the patient was identified taken to the operating room and placed in the supine position. General anesthesia was achieved and then the patient was placed in a low lithotomy position and prepped and draped in standard sterile fashion. Foley  catheter was placed after the patient had been prepped. Timeout was performed and then upper midline abdominal incision was made from the xiphoid process to just below the umbilicus. It was immediately apparent that there was a large upper abdominal lipomatous mass with extensive chyle within the mass. Self-retaining retractor was placed and exploration revealed well-circumscribed mobile mass arising from the retroperitoneum just lateral to the porta hepatis and the superior edge of the duodenum. No other dominant or suspicious masses were identified within the abdomen to suggest carcinomatosis. She did have a large bulky uterus that was mobile. The right renal mass was readily apparent and palpable. Attention was first directed at dissecting the mass from the distal lesser curve of the stomach. A plane was able to be developed between the pseudocapsule and the vessels along the lesser curve. At this point it became clear that the mass was arising from the retroperitoneum involving the chyla cisterna with large lymphatic channels feeding into the lipomatous mass. Extensive dissection was carried out to completely isolate the origin of the mass from the retroperitoneum where the lymphatic channels were clipped and divided as were venous branches. After extensive dissection this was able to be dissected free. To facilitate the dissection extensive kocherization of the duodenum was performed. Once the specimen  was freed and passed off the field for permanent there was a band or bending visualization more cephalad of the retroperitoneum. For that reason the gallbladder was resected to facilitate direct inspection of the anterior surface of the aorta and vena cava. Gallbladder was taken down in a retrograde fashion the cystic artery was clearly delineated clipped and divided as was the cystic duct. There was no evidence of chylous fluid after direct inspection for over 5 minutes. The hepatic flexure was completely  mobilized allowing exposure of the right kidney where the mass was readily palpable. At this point Dr. Alinda Money proceeded with his resection of the right kidney mass. Please refer to his dictation for his portion of the procedure and Dr. Serita Grit dictation for her component. At this point I scrubbed out of the case and Dr. Alinda Money and Dr. Denman George assumed responsibility of the patient from that point forward.

## 2016-06-18 NOTE — Op Note (Signed)
PATIENT: Bonnie Torres DATE: 06/18/16   Preop Diagnosis: fibroid uterus, right ovarian mass  Postoperative Diagnosis: fibroid uterus, right ovarian mass  Surgery: Total abdominal hysterectomy, bilateral salpingo-oophorectomy  Surgeons: Everitt Amber MD; Lahoma Crocker, MD   Anesthesia: General   Estimated blood loss: 500 ml (for entire procedure)  IVF:  5519ml crystalloid, 500cc albumin  Urine output: [id number]  ml   Complications: None   Pathology: Uterus, cervix, bilateral tubes and ovaries, washings  Operative findings: 16cm bulky, mobile fibroid uterus. 5cm right ovary (frozen section revealed benign luteal cyst). No malignancy or evidence of testosterone secreting tumor.  Procedure: The patient was on the operating room already s/p resection of retroperitoneal tumor and right partial nephrectomy with Dr Rolanda Jay and Dr Alinda Money. The laparotomy incision had been made in the midline and the bookwalter retractor had been assembled and placed. The midline incision was extended inferiorally.   The Buchwalter retractor was then repositioned. At the initial placement as well as at several points during the case the lateral blades were checked to ensure no significant pressure on the psoas bellies.  The small and large bowel were packed out of the way of the surgical field with moist laparotomy sponges and malleable retractors were attached to the Novinger. Abdominal pelvic washings were obtained. The round ligament on the patient's right side was transected with monopolar cautery the anterior posterior leaves the broad ligament were opened. A window was made between the infundibulopelvic vessels and the ureter. The vessels were clamped x2 transected and suture ligated. The bladder flap was created at this point and the uterine vessels on the right side were skeletonized. The uterine vessels were clamped with curved Masterson clamps transected and suture ligated. Our attention  was turned to the left side were similar procedure was performed in that the round ligament was transected and the anterior and posterior leaves the broad ligament were opened. A window was made between the vessels and the ureter on the left and the vessels were clamped x2 transected and suture ligated. The uterine vessels were skeletonized clamped x2 transected and suture ligated. We continued down the cardinal ligaments with straight Masterson clamps the cervicovaginal junction was reached. We came across the vagina just under the cervix with curved Masterson clamps. The cervix was amputated from the vagina. The angle pedicles as well as vaginal cuff were closed using figure-of-eight sutures of 0 Vicryl.  Frozen section revealed benign pathology. The pedicles were noted to be hemostatic. The abdomen pelvis were copiously irrigated. The retractor and laparotomy sponges were removed. The fascia was closed using running mass closure of #1 PDS. The subcutaneous tissues were irrigated and made hemostatic. The subcutaneous tissues were closed with 3-0 vicryl. 20 mL of Exparel within 68mL of quarter percent marcaine and 58mL and 40 mL of normal saline was injected for postoperative pain control. The skin was closed using subcutaneous suture.  All instrument, suture, laparotomy, Ray-Tec, and needle counts were correct x2. The patient tolerated the procedure well and was taken recovery room in stable condition. This is Everitt Amber dictating an operative note on Bonnie Torres.

## 2016-06-18 NOTE — Progress Notes (Signed)
Patient ID: Bonnie Torres, female   DOB: 1956/01/11, 60 y.o.   MRN: [id number]  Post-op note  Subjective: The patient is doing well.  No complaints.  Objective: Vital signs in last 24 hours: Temp:  [97.5 F (36.4 C)] 97.5 F (36.4 C) (12/21 0555) Pulse Rate:  [72-101] 87 (12/21 1445) Resp:  [12-27] 18 (12/21 1445) BP: (110-138)/(63-88) 123/63 (12/21 1436) SpO2:  [95 %-100 %] 97 % (12/21 1445) Weight:  [74.8 kg (165 lb)] 74.8 kg (165 lb) (12/21 0524)  Intake/Output from previous day: No intake/output data recorded. Intake/Output this shift: Total I/O In: 5800 [I.V.:5300; IV Piggyback:500] Out: 2475 [id number]; Drains:400; Blood:500]  Physical Exam:  General: Alert and oriented. Abdomen: Soft, Nondistended. Incisions: Clean and dry.  Lab Results:  Recent Labs  06/18/16 1121  HGB 10.5*  HCT 31.0*    Assessment/Plan: POD#0   1) Continue to monitor, bedrest tonight   Pryor Curia. MD   LOS: 0 days   Analaura Messler,LES 06/18/2016, 3:46 PM

## 2016-06-18 NOTE — Anesthesia Postprocedure Evaluation (Signed)
Anesthesia Post Note  Patient: Bonnie Torres  Procedure(s) Performed: Procedure(s) (LRB): RESECTION RETROPERITONEAL MASS WITH RETORPERITONEAL EXPLORATION ,LIGATION CHYLI CISTERNA,OPEN  CHOLECYSTECTOMY WITH MOBILATION HEPATIC FLEXURE (N/A) HYSTERECTOMY ABDOMINAL TOTAL (N/A) BILATERAL SALPINGO OOPHORECTOMY (Bilateral) OPEN RIGHT NEPHRECTOMY PARTIAL (Right)  Patient location during evaluation: PACU Anesthesia Type: General Level of consciousness: awake and alert Pain management: pain level controlled Vital Signs Assessment: post-procedure vital signs reviewed and stable Respiratory status: spontaneous breathing, nonlabored ventilation, respiratory function stable and patient connected to nasal cannula oxygen Cardiovascular status: blood pressure returned to baseline and stable Postop Assessment: no signs of nausea or vomiting Anesthetic complications: no       Last Vitals:  Vitals:   06/18/16 1600 06/18/16 1700  BP:    Pulse: 81 72  Resp: 13 13  Temp: 36.8 C     Last Pain:  Vitals:   06/18/16 1600  TempSrc: Oral  PainSc:                  Tiajuana Amass

## 2016-06-18 NOTE — Transfer of Care (Signed)
Immediate Anesthesia Transfer of Care Note  Patient: Bonnie Torres  Procedure(s) Performed: Procedure(s): RESECTION RETROPERITONEAL MASS WITH RETORPERITONEAL EXPLORATION ,LIGATION CHYLI CISTERNA,OPEN  CHOLECYSTECTOMY WITH MOBILATION HEPATIC FLEXURE (N/A) HYSTERECTOMY ABDOMINAL TOTAL (N/A) BILATERAL SALPINGO OOPHORECTOMY (Bilateral) OPEN RIGHT NEPHRECTOMY PARTIAL (Right)  Patient Location: PACU  Anesthesia Type:General  Level of Consciousness: awake, alert , oriented and patient cooperative  Airway & Oxygen Therapy: Patient Spontanous Breathing and Patient connected to face mask oxygen  Post-op Assessment: Report given to RN, Post -op Vital signs reviewed and stable and Patient moving all extremities  Post vital signs: Reviewed and stable  Last Vitals:  Vitals:   06/18/16 0521 06/18/16 0555  BP: 138/83 132/82  Pulse:  72  Resp:  16  Temp:  36.4 C    Last Pain:  Vitals:   06/18/16 0555  TempSrc: Oral      Patients Stated Pain Goal: 5 ([id number])  Complications: No apparent anesthesia complications

## 2016-06-18 NOTE — H&P (Signed)
H&P  Consult was requested by Dr. Dalbert Batman for the evaluation of Nelson R Mcclinton 60 y.o. female  CC:      Chief Complaint  Patient presents with  . pelvic mass    New Consultation    Assessment/Plan:  Ms. MADELLE PANZER  is a 60 y.o.  year old with large abdominal, renal and adnexal masses and virilization. It is unclear if this is singular process or metastatic malignancy It is possible that her virilization symptoms are secondary to an ovarian sertoli-leydig tumor (which is considered a low grade malignancy), however, benign processes such as ovarian hyperthecosis can also cause virilization. Only 2% of Sertoli-Leydig tumors are associated with extra-ovarian metastases, and therefore, I think that the hepatic and right renal masses are less likely metastases. I recommend a preoperative biopsy of the perihepatic mass to better define the likely pathology. The renal mass is likely to be a renal cell carcinoma.  We will plan for hysterectomy with BSO, possible lymphadenectomy as part of a planned combined procedure with Dr's Alinda Money and Rolanda Jay.   Frozen section will be performed. If Sertoli-Leydig tumor is identified, we will evaluate lymph nodes for grossly enlarged nodes to debulk, however, due to the extremely rare incidence of lymph node metastases in this tumor, lymphadenectomy would not routinely be performed.  I discussed with the patient our component of the procedure and planned approach. I discussed risks specific with the hysterectomy component of the procedure (with particular relation to urologic or GI complications). I recommend prophylaxis with cefoxatin antibiotics.   HPI: Tekesha Wery is a 60 year old woman who is seen in consultation at the request of Dr Fanny Skates for large adnexal masses in addition to a right renal mass, and a subhepatic abdominal mass.  THe patient is largely asymptomatic from her masses, however reports virilization (with hirsutism  and clitoromegaly) for approximately 1 year prior to diagnosis. Serum testosterone is elevated at 412 on 03/23/16. She is postmenopausal (LMP 2012)  Of note CA 125 is normal at 25 (04/13/16) as were other germ cell tumor markers (AFP, HCG, LDH).  Imaging performed on 04/08/16 with CT abdomen and pelvis showed Several small thyroid nodules, a 4.6 x 4.2 cm enhancing mass in the mid pole of the right kidney concerning for renal cell carcinoma. The dominant mass in the abdomen was a 17.3 x 10.2 x 17.5 cm large mixed soft tissue and fatty density mass with mass effect upon the left lobe of the liver and the stomach. The pelvis reveals a enlarged uterus with numerous calcified and noncalcified fibroids. There is a rim calcified exophytic subserosal fibroid on the left measuring 3.8 cm, and a mixed density probably solid partially enhancing mass within the right adnexa measuring 5.5 x 3.7 cm. It is difficult to determine if this is a right ovary or a pedunculated fibroid.   She was seen by Dr Rolanda Jay who recommended surgical excision with exploratory laparotomy. She has also sought consultation with Dr Alinda Money from [facility] who recommended right nephrectomy/partial nephrectomy.    She has a family history significant for a sister with epithelial ovarian cancer and a first cousin with ovarian cancer (both in their 70's).    Current Meds:      Outpatient Encounter Prescriptions as of 04/27/2016  Medication Sig  . allopurinol (ZYLOPRIM) 100 MG tablet Take 1 tablet by mouth  daily  . cloNIDine (CATAPRES) 0.3 MG tablet Take 1 tablet (0.3 mg total) by mouth 2 (two) times daily. (Patient  taking differently: Take 0.3 mg by mouth at bedtime. )  . colchicine 0.6 MG tablet Take 1 tablet by mouth  daily  . metoprolol (LOPRESSOR) 100 MG tablet Take 1 tablet (100 mg total) by mouth 2 (two) times daily. (Patient taking differently: Take 100 mg by mouth daily. )  . potassium chloride SA (K-DUR,KLOR-CON) 20  MEQ tablet Take 1 tablet (20 mEq total) by mouth daily.  . pravastatin (PRAVACHOL) 40 MG tablet Take one tablet by mouth daily.  . meloxicam (MOBIC) 15 MG tablet Take 1 tablet (15 mg total) by mouth daily. As needed for back pain (Patient not taking: Reported on 04/27/2016)  . nystatin ointment (MYCOSTATIN) Apply 1 application topically 2 (two) times daily.  . Olmesartan-Amlodipine-HCTZ (TRIBENZOR) 20-5-12.5 MG TABS Take 1 tablet by mouth daily. (Patient not taking: Reported on 04/27/2016)   No facility-administered encounter medications on file as of 04/27/2016.     Allergy: No Known Allergies  Social Hx:   Social History        Social History  . Marital status: Widowed    Spouse name: N/A  . Number of children: N/A  . Years of education: N/A      Occupational History  . Not on file.       Social History Main Topics  . Smoking status: Never Smoker  . Smokeless tobacco: Never Used  . Alcohol use No  . Drug use: No  . Sexual activity: Not Currently       Other Topics Concern  . Not on file      Social History Narrative  . No narrative on file    Past Surgical Hx:       Past Surgical History:  Procedure Laterality Date  . SHOULDER SURGERY Right 2010  . TOE SURGERY Right Hammer Toe   2004    Past Medical Hx:      Past Medical History:  Diagnosis Date  . Hyperlipidemia   . Hypertension     Past Gynecological History:  SVD x 1. Denies history of fibroids or menorrhagia. LMP 2012 No LMP recorded. Patient is postmenopausal.  Family Hx:        Family History  Problem Relation Age of Onset  . Heart attack Paternal Aunt   . Hypertension Mother   . Diabetes Maternal Aunt   . Cancer Paternal Aunt     stomach  . Cancer Sister     Review of Systems:  Constitutional  Feels well,    ENT Normal appearing ears and nares bilaterally Skin/Breast  + hirsutism Cardiovascular  No chest pain, shortness of breath, or edema   Pulmonary  No cough or wheeze.  Gastro Intestinal  No nausea, vomitting, or diarrhoea. No bright red blood per rectum, no abdominal pain, change in bowel movement, or constipation.  Genito Urinary  No frequency, urgency, dysuria, no bleeding, no pelvic pressure Musculo Skeletal  No myalgia, arthralgia, joint swelling or pain  Neurologic  No weakness, numbness, change in gait,  Psychology  No depression, anxiety, insomnia.   Vitals:  Blood pressure (!) 153/85, pulse 92, temperature 98.2 F (36.8 C), temperature source Oral, resp. rate 18, weight 167 lb 3.2 oz (75.8 kg), SpO2 100 %.  Physical Exam: Shaved stubble on face and abdomen consistent with hirsutism. Neck  Supple NROM, without any enlargements.  Lymph Node Survey No cervical supraclavicular or inguinal adenopathy Cardiovascular  Pulse normal rate, regularity and rhythm. S1 and S2 normal.  Lungs  Clear to auscultation bilateraly, without  wheezes/crackles/rhonchi. Good air movement.  Skin  No rash/lesions/breakdown  Psychiatry  Alert and oriented to person, place, and time  Abdomen  Normoactive bowel sounds, abdomen soft, non-tender and overweight without evidence of hernia. Mass appreciated filling RUQ (firm). No pelvic mass appreciated on abdominal exam. Back No CVA tenderness Genito Urinary  Vulva/vagina: clitoromegaly.  No lesions. No discharge or bleeding.             Bladder/urethra:  No lesions or masses, well supported bladder             Vagina: normal             Cervix: Normal appearing, no lesions.             Uterus: Bulky, 12-14cm, mobile, no parametrial involvement or nodularity.             Adnexa: no discrete palpable masses. Rectal  Good tone, no masses no cul de sac nodularity.  Extremities  No bilateral cyanosis, clubbing or edema.   Donaciano Eva, MD

## 2016-06-18 NOTE — Interval H&P Note (Signed)
History and Physical Interval Note:  06/18/2016 7:25 AM  Bonnie Torres  has presented today for surgery, with the diagnosis of abdominal mass, renal mass, ovarian mass RIGHT RENAL NEOPLASM  The various methods of treatment have been discussed with the patient and family. After consideration of risks, benefits and other options for treatment, the patient has consented to  Procedure(s): EXCISION OF INTRA ABDOMINAL MASS, POSSIBLE PARTIAL LIVER RESECTION, POSSIBLE CHOLECYSTECTOMY, POSSBLE BOWEL RESSECTION, POSSIBLE GASTRECTOMY, POSSIBLE COLOSTOMY AND OTHER INDICATED PROCEDURES (N/A) HYSTERECTOMY ABDOMINAL TOTAL (N/A) BILATERAL SALPINGO OOPHORECTOMY WITH POSSIBLE LYMPHADECTOMY (Bilateral) OPEN RIGHT NEPHRECTOMY PARTIAL (Right) as a surgical intervention .  The patient's history has been reviewed, patient examined, no change in status, stable for surgery.  I have reviewed the patient's chart and labs.  Questions were answered to the patient's satisfaction.     Raelea Gosse,LES

## 2016-06-18 NOTE — Anesthesia Procedure Notes (Signed)
Arterial Line Insertion Start/End12/21/2017 7:35 AM, 06/18/2016 7:40 AM Performed by: Suzette Battiest, anesthesiologist  Patient location: OR. Preanesthetic checklist: patient identified, IV checked, site marked, risks and benefits discussed, surgical consent, monitors and equipment checked, pre-op evaluation, timeout performed and anesthesia consent Left, radial was placed Catheter size: 20 Fr Hand hygiene performed  and maximum sterile barriers used   Attempts: 1 Procedure performed without using ultrasound guided technique. Following insertion, dressing applied. Post procedure assessment: normal and unchanged

## 2016-06-18 NOTE — Op Note (Signed)
Preoperative diagnosis: Right renal neoplasm  Postoperative diagnosis: Right renal neoplasm  Procedure: Right partial nephrectomy, intraoperative renal ultrasound  Surgeon: Pryor Curia. M.D.  Resident: Bonnie Torres  Anesthesia: General  Complications: None  EBL: 100 cc  Intraoperative findings: Intraoperative renal ultrasonography was performed.  This demonstrated a 4.6 x 3.3 cm solid appearing renal mass abutting the renal collecting system.  Specimen: Right kidney and ureter  Disposition of specimen: To pathology  Indication: Bonnie Torres is a pleasant 60 year old female who was recently diagnosed with a large intra-abdominal tumor suspicious for sarcoma.  Incidentally, she was also noted to have a 4.6 cm enhancing right renal neoplasm concerning for renal malignancy.  She was also noted to have a large adnexal mass.  She was scheduled to proceed with surgical resection of her abdominal tumor and adnexal mass.  As part of this procedure, she elected to proceed with treatment of her renal neoplasm.  Although her mass was complex, it was felt that nephron sparing surgery would likely be possible.  Our plan was therefore to perform a partial nephrectomy with possible radical nephrectomy if necessary.  After reviewing the potential risks, complications, and expected recovery process, she elected to proceed.  Informed consent was obtained.  Description of procedure: The patient was taken to the operating room and a general anesthetic was administered.  She was given preoperative antibiotics, and placed in low lithotomy position.  Dr. Rolanda Jay performed the initial portion of the procedure through a large midline incision with resection of her intra-abdominal mass.  Please see his operative note for full details.  After the intra-abdominal mass was resected, attention turned to the right kidney.  The ascending colon and duodenum were mobilized medially allowing exposure of the  retroperitoneum and kidney.  The renal mass was immediately identified on the anterior, lower portion of the right kidney.  Dissection was performed on top of the inferior vena cava which allowed identification of the gonadal vein and renal vein.  The kidney was immobilized within the perinephric fat around the kidney to be freely mobilized from the adrenal gland.  The ureter was identified.  The renal hilum was then carefully dissected with a single renal artery and single renal vein isolated.  Vessel loops were placed around the artery and vein.  Preparations were then made for tumor resection.  Bulldog clamps were placed on the renal artery and subsequently the renal vein.  An isolation bag was then placed over the kidney and iced slush was placed around the kidney for 10 minutes to induce cold ischemia.  After 10 minutes, the tumor margins were identified with intraoperative ultrasound.  This confirmed that the mass did abut the renal collecting system.  The margins on the capsule of the kidney were marked out with electrocautery.  I then used Metzenbaum scissors to perform sharp dissection into the renal parenchyma to resect the mass.  The renal collecting system was entered in multiple places as expected.  The mass appeared to be grossly resected without concern for entry into the mass.  A deep margin was sent as a separate specimen.  Attention then turned to reconstruction of the kidney.  A 3-0 V loc suture was used to close all openings of the collecting system identified.  A second 3-0 V loc suture was then used to run the inner portion of the renal defect.  Surgi-Flo was then placed into the kidney defect and a 2-0 V loc was used to close the renal capsule  and reapproximate the renal parenchyma.   The bulldog clamps were then removed with the vein first and the artery second.  There was noted be some arterial bleeding from the defect was then oversewn with another 2-0 V loc this resulted in excellent  hemostasis.  It was estimated that approximately 40% of the kidney was removed.  Additional surgical flow was placed over the defect and the kidney was placed back into its anatomic position.  Hemostasis was ensured.  The perinephric drain was placed and brought out through a separate stab incision in the right side of the abdomen and secured to the skin with a nylon suture.    At this point, the surgery was turned over to Dr. Denman George for the gynecologic portion of the procedure.  Please see her dictated note for full details.

## 2016-06-18 NOTE — H&P (Signed)
Office Visit Report     05/19/2016   --------------------------------------------------------------------------------   Bonnie Torres  MRN: [id number]  PRIMARY CARE:  [facility], [place]  DOB: [birth year], 60 year old Female  REFERRING:  T Tonye Becket, MD  SSN: [id number]  PROVIDER:  Raynelle Bring, M.D.    LOCATION:  [facility] [phone]   --------------------------------------------------------------------------------   CC/HPI: CC: Right renal neoplasm  Physician requesting consult: Dr. Frederich Cha  Primary care physician: Dr. Beatrice Lecher   Ms. Batie is a 60 year old female who presented to her primary care provider about one month ago with a one year history of developing hirsuitism. The eventually led to CT imaging of the abdomen that demonstrated a large upper abdominal mass, right renal mass, and a right adnexal mass. Further imaging was performed with a CT of the chest, abdomen and pelvis with contrast confirming several small thyroid nodules, a dominant 17.5 x 17.3 heterogeneous upper abdominal mass abutting the liver and stomach with a fatty component worrisome for a liposarcoma, a 4.6 cm heterogeneous, enhancing right renal neoplasm worrisome for renal malignancy, and a 5.5 cm right adnexal mass. Her serum testosterone was found to be elevated at 425 raising the question of a germ cell tumor. Serum cortisol was normal. Chest imaging was negative for pulmonary metastases. She has undergone a percutaneous biopsy of her abdominal massive demonstrated spindle cells but no mitotic figures. 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 FISH for MDM2 was negative.   She has been evaluated by Dr. Burney Gauze, Dr. Fanny Skates, and then was sent to Dr. Frederich Cha. While a percutaneous biopsy of the large abdominal mass is pending for further diagnostic purposes, he is tentatively planning an exploratory  laparotomy with surgical excision of the large upper abdominal mass considering the concern for sarcoma. Dr. Everitt Amber has also evaluted Ms. Forrest and all other tumor markers are negative. She has recommended a hysterectomy with BSO.   She is sent today in consultation for management of her right renal neoplasm.   Family history of kidney cancer: None  Family history of ESRD: None   Imaging: CT scan without IV contrast (04/03/16), CT with contrast (04/08/16)  Side of renal neoplasm: Right  Size of renal neoplasm: 4.6 cm  Location of renal neoplasm: Lower, interpolar  Exophytic or endophytic: 50% endophytic, abutting collecting system  Renal nephrometry score: 10x indicating a highly complex renal tumor   Renal artery anatomy: Single renal artery  Renal vein anatomy: Single renal vein   Contralateral renal lesions: None  Regional lymphadenopathy: None.  Adrenal masses: None.  Renal vein/IVC involvement: No.  Metastatic disease to the abdomen: No.   Chest imaging: Negative (CT)  LFTs: Normal.   Baseline renal function: Cr 1.0 , eGFR > 60 ml/min   PMH: Past medical history is significant for hypertension, hyperlipidemia, and gout.  PSH: No abdominal surgeries.     ALLERGIES: None   MEDICATIONS: Allopurinol     GU PSH: None   NON-GU PSH: Right foot, great toe, Right Shoulder Arthroscopy/surgery, Right    GU PMH: None   NON-GU PMH: Gout Hypercholesterolemia Hypertension    FAMILY HISTORY: None   SOCIAL HISTORY: Marital Status: Widowed Current Smoking Status: Patient has never smoked.  Does not drink anymore.  Drinks 2 caffeinated drinks per day.    REVIEW OF SYSTEMS:    GU Review Female:   Patient denies frequent urination, hard to postpone urination,  burning /pain with urination, get up at night to urinate, leakage of urine, stream starts and stops, trouble starting your stream, have to strain to urinate, and currently pregnant.  Gastrointestinal (Upper):    Patient denies nausea and vomiting.  Gastrointestinal (Lower):   Patient denies constipation and diarrhea.  Constitutional:   Patient denies fever, night sweats, weight loss, and fatigue.  Skin:   Patient denies skin rash/ lesion and itching.  Eyes:   Patient denies blurred vision and double vision.  Ears/ Nose/ Throat:   Patient denies sore throat and sinus problems.  Hematologic/Lymphatic:   Patient denies swollen glands and easy bruising.  Cardiovascular:   Patient denies leg swelling and chest pains.  Respiratory:   Patient denies cough and shortness of breath.  Endocrine:   Patient denies excessive thirst.  Musculoskeletal:   Patient denies back pain and joint pain.  Neurological:   Patient denies headaches and dizziness.  Psychologic:   Patient denies depression and anxiety.   VITAL SIGNS:      05/19/2016 09:52 AM  Weight 163 lb / 73.94 kg  Height 60 in / 152.4 cm  BP 131/85 mmHg  Pulse 72 /min  BMI 31.8 kg/m   MULTI-SYSTEM PHYSICAL EXAMINATION:    Constitutional: Well-nourished. No physical deformities. Normally developed. Good grooming.  Neck: Neck symmetrical, not swollen. Normal tracheal position.  Respiratory: No labored breathing, no use of accessory muscles. Clear bilaterally.  Cardiovascular: Normal temperature, normal extremity pulses, no swelling, no varicosities. Regular rate and rhythm.  Lymphatic: No enlargement of neck, axillae, groin.  Skin: No paleness, no jaundice, no cyanosis. No lesion, no ulcer, no rash.  Neurologic / Psychiatric: Oriented to time, oriented to place, oriented to person. No depression, no anxiety, no agitation.  Gastrointestinal: Her abdomen is soft and nondistended. He does have some fullness in her upper abdomen although no firm mass is clearly identified.  Eyes: Normal conjunctivae. Normal eyelids.  Ears, Nose, Mouth, and Throat: Left ear no scars, no lesions, no masses. Right ear no scars, no lesions, no masses. Nose no scars, no lesions,  no masses. Normal hearing. Normal lips.  Musculoskeletal: Normal gait and station of head and neck.     PAST DATA REVIEWED:  Source Of History:  Patient  Lab Test Review:   LFT, CMP, Total Testosterone  Records Review:   Pathology Reports, Previous Patient Records  Urine Test Review:   Urinalysis  X-Ray Review: C.T. Chest/ Abd/Pelvis: Reviewed Films.     PROCEDURES:          Urinalysis w/Scope Dipstick Dipstick Cont'd Micro  Color: Yellow Bilirubin: Neg WBC/hpf: 0 - 5/hpf  Appearance: Clear Ketones: Neg RBC/hpf: NS (Not Seen)  Specific Gravity: 1.010 Blood: Trace Bacteria: Few (10-25/hpf)  pH: 7.0 Protein: Neg Cystals: NS (Not Seen)  Glucose: Neg Urobilinogen: 0.2 Casts: NS (Not Seen)    Nitrites: Neg Trichomonas: Not Present    Leukocyte Esterase: Neg Mucous: Not Present      Epithelial Cells: 0 - 5/hpf      Yeast: NS (Not Seen)      Sperm: Not Present    ASSESSMENT:      ICD-10 Details  1 GU:   Neoplasm of unspecified behavior of right kidney - D49.511    PLAN:           Schedule Return Visit: Keep Scheduled Appointment          Document Letter(s):  Created for Patient: Clinical Summary  Notes:   1. Right renal mass concerning for renal malignancy: The patient was provided information regarding their renal mass including the relative risk of benign versus malignant pathology and the natural history of renal cell carcinoma and other possible malignancies of the kidney. The role of renal biopsy, laboratory testing, and imaging studies to further characterize renal masses and/or the presence of metastatic disease were explained. We discussed the role of active surveillance, surgical therapy with both radical nephrectomy and nephron-sparing surgery, and ablative therapy in the treatment of renal masses. In addition, we discussed our goals of providing an accurate diagnosis and oncologic control while maintaining optimal renal function as appropriate based on the size,  location, and complexity of their renal mass as well as their co-morbidities.   We have discussed the risks of treatment in detail including but not limited to bleeding, infection, heart attack, stroke, death, venothromoboembolism, cancer recurrence, injury/damage to surrounding organs and structures, urine leak, the possibility of open surgical conversion for patients undergoing minimally invasive surgery, the risk of developing chronic kidney disease and its associated implications, and the potential risk of end stage renal disease possibly necessitating dialysis.   She does have a complex right renal mass although I think this is amenable to nephron sparing surgery. She will be undergoing a large laparotomy and this will be performed as an open procedure at the time of her abdominal mass removal and hysterectomy/oophorectomy. The plan is to perform a right open partial nephrectomy. This will be performed after her abdominal mass is removed and prior to her hysterectomy during the same procedure. All questions were answered to her stated satisfaction. She gives her informed consent to proceed.   Dr. Beatrice Lecher  Dr. Burney Gauze  Dr. Frederich Cha  Dr. Everitt Amber  Dr. Fanny Skates    * Signed by Raynelle Bring, M.D. on 05/19/16 at 5:34 PM (EST)*

## 2016-06-19 LAB — CBC
HCT: 29.5 % — ABNORMAL LOW (ref 36.0–46.0)
Hemoglobin: 10.8 g/dL — ABNORMAL LOW (ref 12.0–15.0)
MCH: 26.5 pg (ref 26.0–34.0)
MCHC: 36.6 g/dL — ABNORMAL HIGH (ref 30.0–36.0)
MCV: 72.5 fL — AB (ref 78.0–100.0)
PLATELETS: 243 10*3/uL (ref 150–400)
RBC: 4.07 MIL/uL (ref 3.87–5.11)
RDW: 12.9 % (ref 11.5–15.5)
WBC: 12.3 10*3/uL — AB (ref 4.0–10.5)

## 2016-06-19 LAB — BASIC METABOLIC PANEL
ANION GAP: 5 (ref 5–15)
BUN: 13 mg/dL (ref 6–20)
CALCIUM: 8 mg/dL — AB (ref 8.9–10.3)
CO2: 24 mmol/L (ref 22–32)
Chloride: 106 mmol/L (ref 101–111)
Creatinine, Ser: 1.62 mg/dL — ABNORMAL HIGH (ref 0.44–1.00)
GFR calc Af Amer: 39 mL/min — ABNORMAL LOW (ref >60)
GFR, EST NON AFRICAN AMERICAN: 34 mL/min — AB (ref >60)
GLUCOSE: 162 mg/dL — AB (ref 65–99)
POTASSIUM: 3.9 mmol/L (ref 3.5–5.1)
SODIUM: 135 mmol/L (ref 135–145)

## 2016-06-19 LAB — HEMOGLOBIN AND HEMATOCRIT, BLOOD
HCT: 28.1 % — ABNORMAL LOW (ref 36.0–46.0)
Hemoglobin: 10.4 g/dL — ABNORMAL LOW (ref 12.0–15.0)

## 2016-06-19 MED ORDER — DEXTROSE-NACL 5-0.45 % IV SOLN
INTRAVENOUS | Status: DC
  Administered 2016-06-19 – 2016-06-23 (×7): via INTRAVENOUS

## 2016-06-19 MED ORDER — SODIUM CHLORIDE 0.9 % IV BOLUS (SEPSIS)
500.0000 mL | Freq: Once | INTRAVENOUS | Status: AC
  Administered 2016-06-19: 500 mL via INTRAVENOUS

## 2016-06-19 MED ORDER — ENOXAPARIN SODIUM 40 MG/0.4ML ~~LOC~~ SOLN
40.0000 mg | SUBCUTANEOUS | Status: DC
  Administered 2016-06-19 – 2016-06-24 (×6): 40 mg via SUBCUTANEOUS
  Filled 2016-06-19 (×6): qty 0.4

## 2016-06-19 NOTE — Progress Notes (Signed)
Urology Progress Note  1 Day Post-Op  Subjective: The patient is doing well.  No nausea or vomiting. Pain is adequately controlled.  Low urine output overnight.   Objective: Vital signs in last 24 hours: Temp:  [97.4 F (36.3 C)-98.3 F (36.8 C)] 98.1 F (36.7 C) (12/22 0349) Pulse Rate:  [71-101] 71 (12/22 0600) Resp:  [11-27] 13 (12/22 0600) BP: (110-123)/(63-88) 123/63 (12/21 1436) SpO2:  [95 %-100 %] 98 % (12/22 0600) Arterial Line BP: (109-141)/(57-88) 115/57 (12/22 0600)  Intake/Output from previous day: 12/21 0701 - 12/22 0700 In: 7366.3 [I.V.:6376.3; IV Piggyback:900] Out: 3085 [Urine:2155; Drains:430; Blood:500] Intake/Output this shift: No intake/output data recorded.  Physical Exam:  General: Alert and oriented. CV: RRR Lungs: Clear bilaterally. GI: Soft, Nondistended. JP SS. Incisions: Clean, dry, and intact Urine: Clear, concentrated Extremities: Nontender, no erythema, no edema.  Lab Results:  Recent Labs  06/18/16 1121 06/18/16 1958 06/19/16 0357  HGB 10.5* 12.4 10.8*  HCT 31.0* 33.6* 29.5*   Lab Results  Component Value Date   CREATININE 1.62 (H) 06/19/2016   CREATININE 1.24 (H) 06/18/2016   CREATININE 1.00 06/12/2016     Assessment/Plan: POD#1 s/p resection of retroperitoneal mass and cholecystectomy (Dr. Rolanda Jay), right partial nephrectomy (Dr. Alinda Money), TAH/BSO (Dr. Denman George). Doing well post-op.  1. 500cc bolus for low urine output, increase maintenance rate to 100/hr 2. Diet per Dr. Rolanda Jay 3. D/c bedrest, OOB with assistance as tolerated 4. Continue foley catheter for accurate I&Os and until more mobile 5. Ppx: Lovenox (CrCl >30) 6. Continue JP drain 7. Continue daily labs    LOS: 1 day   Rodolphe Edmonston R 06/19/2016, 7:48 AM

## 2016-06-19 NOTE — Progress Notes (Signed)
S:  No significant events overnight.  Pain well-controlled.  No flatus or BM. O:  VSS HR 71 I/O 7366/3085  UOP 2155 (receiving bolus this am) Awake, Alert CTA equal BS Abd Bowel sounds hypoactive, round, soft, appropriately tender.  Incision C/D/I Ext:  Calves non-tender, no clubbing or edema On K replacement protocol. A:  Pt doing well POD #1.  Operative findings reviewed with patient and her family at bedside.   P: Will await return of bowel function prior to advancing diet and ambulation/anticoaggulation per Urology service.  Discussed with Dr. Denman George at bed side.

## 2016-06-19 NOTE — Progress Notes (Signed)
1 Day Post-Op Procedure(s) (LRB): RESECTION RETROPERITONEAL MASS WITH RETORPERITONEAL EXPLORATION ,LIGATION CHYLI CISTERNA,OPEN  CHOLECYSTECTOMY WITH MOBILATION HEPATIC FLEXURE (N/A) HYSTERECTOMY ABDOMINAL TOTAL (N/A) BILATERAL SALPINGO OOPHORECTOMY (Bilateral) OPEN RIGHT NEPHRECTOMY PARTIAL (Right)  Subjective: Patient reports feeling generally well postop. Has not yet been up. She has not passed flatus. Pain well controlled.    Objective: Vital signs in last 24 hours: Temp:  [97.4 F (36.3 C)-98.3 F (36.8 C)] 98.2 F (36.8 C) (12/22 0749) Pulse Rate:  [71-101] 71 (12/22 0600) Resp:  [11-27] 13 (12/22 0600) BP: (110-123)/(63-88) 123/63 (12/21 1436) SpO2:  [95 %-100 %] 98 % (12/22 0600) Arterial Line BP: (109-141)/(57-88) 115/57 (12/22 0600)    Intake/Output from previous day: 12/21 0701 - 12/22 0700 In: 7366.3 [I.V.:6376.3; IV Piggyback:900] Out: 3085 [Urine:2155; Drains:430; Blood:500]  Physical Examination: General: alert and cooperative Resp: clear to auscultation bilaterally and normal percussion bilaterally Cardio: regular rate and rhythm, S1, S2 normal, no murmur, click, rub or gallop GI: soft, non-tender; bowel sounds normal; no masses,  no organomegaly incision clean, dry, intact with dressing in place. No drainage. JP with serosanguinou output.  Labs: WBC/Hgb/Hct/Plts:  12.3/10.8/29.5/243 (12/22 0357) BUN/Cr/glu/ALT/AST/amyl/lip:  13/1.62/--/--/--/--/-- (12/22 0357) CMP Latest Ref Rng & Units 06/19/2016 06/18/2016 06/12/2016  Glucose 65 - 99 mg/dL 162(H) - 110(H)  BUN 6 - 20 mg/dL 13 - 9  Creatinine 0.44 - 1.00 mg/dL 1.62(H) 1.24(H) 1.00  Sodium 135 - 145 mmol/L 135 139 138  Potassium 3.5 - 5.1 mmol/L 3.9 2.8(L) 3.7  Chloride 101 - 111 mmol/L 106 - 102  CO2 22 - 32 mmol/L 24 - 29  Calcium 8.9 - 10.3 mg/dL 8.0(L) - 9.5  Total Protein 6.5 - 8.1 g/dL - - 8.5(H)  Total Bilirubin 0.3 - 1.2 mg/dL - - 1.1  Alkaline Phos 38 - 126 U/L - - 53  AST 15 - 41 U/L - - 19   ALT 14 - 54 U/L - - 16     Assessment:  60 y.o. s/p Procedure(s): RESECTION RETROPERITONEAL MASS WITH RETORPERITONEAL EXPLORATION ,LIGATION CHYLI CISTERNA,OPEN  CHOLECYSTECTOMY WITH MOBILATION HEPATIC FLEXURE HYSTERECTOMY ABDOMINAL TOTAL BILATERAL SALPINGO OOPHORECTOMY OPEN RIGHT NEPHRECTOMY PARTIAL: stable Pain:  Pain is well-controlled on IVmedications.  Heme:Anemia: acute blood loss anemia - will recheck H&H at 1pm. Will not start VTE prophylaxis until documented stability in anemia  CV: hemodynamically stable.   GI:  Tolerating po: No: NPO until bowel sounds per Dr Rolanda Jay  Abdomen distended, likely delayed return of bowel function.  FEN: hypokalemia resolved, s/p repletion. Continue to monitor and replete prn.  GU: oliguric, likely secondary to acute postop anemia and volume contraction. IVF rate ordered and bolus. Continue to monitor. Foley to remain in per urology until volume status stabilizes. Likely removal of foley tomorrow or later today.  Prophylaxis: hold off lovenox until Hb confirmed stable..  Plan: Continue foley due to strict I&O and patient in ICU NPO advance diet with bowel sounds per Dr Ernestene Kiel H&H today at 1pm Dispo:  Discharge plan to include  The patient is to be discharged to home likely POD 3-5 (pending return of bowel function).   LOS: 1 day    Donaciano Eva 06/19/2016, 8:40 AM

## 2016-06-19 NOTE — Progress Notes (Signed)
RN notified on call NP of low urine output. No new orders at this time.

## 2016-06-20 LAB — CBC
HCT: 27.3 % — ABNORMAL LOW (ref 36.0–46.0)
Hemoglobin: 10 g/dL — ABNORMAL LOW (ref 12.0–15.0)
MCH: 27.2 pg (ref 26.0–34.0)
MCHC: 36.6 g/dL — AB (ref 30.0–36.0)
MCV: 74.2 fL — ABNORMAL LOW (ref 78.0–100.0)
Platelets: 226 10*3/uL (ref 150–400)
RBC: 3.68 MIL/uL — ABNORMAL LOW (ref 3.87–5.11)
RDW: 13.2 % (ref 11.5–15.5)
WBC: 12 10*3/uL — ABNORMAL HIGH (ref 4.0–10.5)

## 2016-06-20 LAB — BASIC METABOLIC PANEL
Anion gap: 5 (ref 5–15)
BUN: 11 mg/dL (ref 6–20)
CALCIUM: 8.4 mg/dL — AB (ref 8.9–10.3)
CO2: 24 mmol/L (ref 22–32)
CREATININE: 1.25 mg/dL — AB (ref 0.44–1.00)
Chloride: 106 mmol/L (ref 101–111)
GFR calc non Af Amer: 46 mL/min — ABNORMAL LOW (ref >60)
GFR, EST AFRICAN AMERICAN: 54 mL/min — AB (ref >60)
Glucose, Bld: 140 mg/dL — ABNORMAL HIGH (ref 65–99)
Potassium: 3.7 mmol/L (ref 3.5–5.1)
SODIUM: 135 mmol/L (ref 135–145)

## 2016-06-20 MED ORDER — IRBESARTAN 150 MG PO TABS: 150.0000 mg | ORAL_TABLET | Freq: Every day | ORAL | Status: DC

## 2016-06-20 NOTE — Progress Notes (Signed)
Urology Progress Note  2 Days Post-Op  Subjective: The patient is doing well.  Minimal complaints of pain.  No nausea.  No flatus.    Objective: Vital signs in last 24 hours: Temp:  [97.5 F (36.4 C)-98.6 F (37 C)] 98.6 F (37 C) (12/23 0017) Pulse Rate:  [34-94] 70 (12/23 0600) Resp:  [11-24] 16 (12/23 0800) BP: (139-141)/(67-68) 139/68 (12/22 2000) SpO2:  [95 %-99 %] 98 % (12/23 0800) Arterial Line BP: (129-163)/(62-86) 149/71 (12/23 0600) FiO2 (%):  [97 %] 97 % (12/22 1205)  Intake/Output from previous day: 12/22 0701 - 12/23 0700 In: 2355.8 [I.V.:2355.8] Out: [id number] [Urine:1200; Drains:45] Intake/Output this shift: No intake/output data recorded.  Physical Exam:  General: Alert and oriented. CV: RRR Lungs: Clear bilaterally. GI: mildly distended, appropriately tender. JP SS. Incisions: Clean, dry, and intact Urine: Clear, concentrated Extremities: Nontender, no erythema, no edema.  Lab Results:  Recent Labs  06/19/16 0357 06/19/16 1557 06/20/16 0413  HGB 10.8* 10.4* 10.0*  HCT 29.5* 28.1* 27.3*   Lab Results  Component Value Date   CREATININE 1.25 (H) 06/20/2016   CREATININE 1.62 (H) 06/19/2016   CREATININE 1.24 (H) 06/18/2016     Assessment/Plan: POD#2 s/p resection of retroperitoneal mass and cholecystectomy (Dr. Rolanda Jay), right partial nephrectomy (Dr. Alinda Money), TAH/BSO (Dr. Denman George). Doing well post-op.    1. Diet per Dr. Rolanda Jay 2. OOB to chair - PT/OT 3. UOP has been better, renal function improving, ok to remove catheter although maybe wait one more day until she is more mobile 4. Ppx: Lovenox (CrCl >30) 5. Continue JP drain - minimal output.   6. Ok to transfer to floor from [employer] prospective.   7. Will decrease IVF secondary to HTN and minimize ileus.    LOS: 2 days   Ardis Hughs 06/20/2016, 8:16 AM

## 2016-06-20 NOTE — Progress Notes (Signed)
2 Days Post-Op Procedure(s) (LRB): RESECTION RETROPERITONEAL MASS WITH RETORPERITONEAL EXPLORATION ,LIGATION CHYLI CISTERNA,OPEN  CHOLECYSTECTOMY WITH MOBILATION HEPATIC FLEXURE (N/A) HYSTERECTOMY ABDOMINAL TOTAL (N/A) BILATERAL SALPINGO OOPHORECTOMY (Bilateral) OPEN RIGHT NEPHRECTOMY PARTIAL (Right)  Subjective: Patient reports feeling generally well postop. Has not yet been up walking other than out of bed to chair. She has not passed flatus. Is belching. Small amount of vaginal bleeding. Pain well controlled.    Objective: Vital signs in last 24 hours: Temp:  [97.5 F (36.4 C)-98.6 F (37 C)] 98.1 F (36.7 C) (12/23 0800) Pulse Rate:  [60-94] 88 (12/23 0800) Resp:  [11-24] 16 (12/23 0800) BP: (139)/(68) 139/68 (12/22 2000) SpO2:  [95 %-99 %] 98 % (12/23 0800) Arterial Line BP: (129-163)/(62-86) 150/77 (12/23 0800) FiO2 (%):  [97 %] 97 % (12/22 1205)    Intake/Output from previous day: 12/22 0701 - 12/23 0700 In: 2455.8 [I.V.:2455.8] Out: 1245 [Urine:1200; Drains:45]  Physical Examination: General: alert and cooperative Resp: clear to auscultation bilaterally and normal percussion bilaterally Cardio: regular rate and rhythm, S1, S2 normal, no murmur, click, rub or gallop GI: soft, non-tender; bowel sounds normal; no masses,  no organomegaly incision clean, dry, intact with dressing in place. No drainage. JP with serosanguinou output.  Labs: WBC/Hgb/Hct/Plts:  12.0/10.0/27.3/226 (12/23 0413) BUN/Cr/glu/ALT/AST/amyl/lip:  11/1.25/--/--/--/--/-- (12/23 0413) CMP Latest Ref Rng & Units 06/20/2016 06/19/2016 06/18/2016  Glucose 65 - 99 mg/dL 140(H) 162(H) -  BUN 6 - 20 mg/dL 11 13 -  Creatinine 0.44 - 1.00 mg/dL 1.25(H) 1.62(H) 1.24(H)  Sodium 135 - 145 mmol/L 135 135 139  Potassium 3.5 - 5.1 mmol/L 3.7 3.9 2.8(L)  Chloride 101 - 111 mmol/L 106 106 -  CO2 22 - 32 mmol/L 24 24 -  Calcium 8.9 - 10.3 mg/dL 8.4(L) 8.0(L) -  Total Protein 6.5 - 8.1 g/dL - - -  Total Bilirubin  0.3 - 1.2 mg/dL - - -  Alkaline Phos 38 - 126 U/L - - -  AST 15 - 41 U/L - - -  ALT 14 - 54 U/L - - -     Assessment:  60 y.o. s/p Procedure(s): RESECTION RETROPERITONEAL MASS WITH RETORPERITONEAL EXPLORATION ,LIGATION CHYLI CISTERNA,OPEN  CHOLECYSTECTOMY WITH MOBILATION HEPATIC FLEXURE HYSTERECTOMY ABDOMINAL TOTAL BILATERAL SALPINGO OOPHORECTOMY OPEN RIGHT NEPHRECTOMY PARTIAL: stable Pain:  Pain is well-controlled on IVmedications.  Heme:Anemia: acute blood loss anemia - stable Hb over 24 hours. No signs of active bleeding.  CV: hemodynamically stable.   GI:  Tolerating po: No:  Abdomen distended and continued minimal bowel sounds, likely delayed return of bowel function. Sipping on clears. Advised to "go easy" on oral intake as she has a high chance of emesis while she is belching and not passing flatus.   FEN: hypokalemia resolved, s/p repletion. Continue to monitor and replete prn. IVF's decreased per Urology  GU: improved volume status and improved creatinine. Foley to remain in per urology until volume status stabilizes. Likely removal of foley tomorrow or later today.  Gyn: vaginal bleeding consistent with s/p hysterectomy state. Final pathology confirmed no evidence of malignancy (including no source of increased testosterone levels). Recommend rechecking testosterone at postop check to evaluate for resolution - if remains high, would recommend endocrinology referral.  Prophylaxis: Lovenox prophylaxis started upon documentation of stable H&H  Plan: Possible foley out today if ambulating. Continue advancement of diet with bowel sounds and evidence of return of bowel function per Dr Rolanda Jay Dispo:  Discharge plan to include  The patient is to be discharged to home likely  POD 4-5 (pending return of bowel function).   LOS: 2 days    Donaciano Eva 06/20/2016, 10:29 AM

## 2016-06-20 NOTE — Progress Notes (Signed)
S:  No significant events overnight.  No flatus.  Good pain control.  Cleared by Urology to ambulate and transfer to floor. O:  Vital signs in last 24 hours: Temp:  [97.5 F (36.4 C)-98.6 F (37 C)] 98.6 F (37 C) (12/23 0017) Pulse Rate:  [34-94] 70 (12/23 0600) Resp:  [11-24] 16 (12/23 0800) BP: (139-141)/(67-68) 139/68 (12/22 2000) SpO2:  [95 %-99 %] 98 % (12/23 0800) Arterial Line BP: (129-163)/(62-86) 149/71 (12/23 0600) FiO2 (%):  [97 %] 97 % (12/22 1205) Intake/Output from previous day: In: 2355.8 [I.V.:2355.8] Out: 1245 [Urine:1200; Drains:45]  Lungs:  CTA equal BS CVS: RRR  Abd:  Round, soft, appropriately tender.  Incision Clean/Dry/Intact  Drain serosanguinous. Ext:  No edema, no calf tenderness  Labs:  Hgb 10.8 stable, creat down to 1.25  A:  Pt doing well. P:   1.  Ambulate per urology   2.  Transfer to regular room  3.  Start clear liquids.

## 2016-06-21 LAB — CBC
HCT: 28.4 % — ABNORMAL LOW (ref 36.0–46.0)
Hemoglobin: 10.3 g/dL — ABNORMAL LOW (ref 12.0–15.0)
MCH: 26.9 pg (ref 26.0–34.0)
MCHC: 36.3 g/dL — AB (ref 30.0–36.0)
MCV: 74.2 fL — ABNORMAL LOW (ref 78.0–100.0)
Platelets: 257 10*3/uL (ref 150–400)
RBC: 3.83 MIL/uL — AB (ref 3.87–5.11)
RDW: 13.2 % (ref 11.5–15.5)
WBC: 11.8 10*3/uL — ABNORMAL HIGH (ref 4.0–10.5)

## 2016-06-21 LAB — BASIC METABOLIC PANEL
Anion gap: 6 (ref 5–15)
BUN: 11 mg/dL (ref 6–20)
CALCIUM: 8.8 mg/dL — AB (ref 8.9–10.3)
CO2: 30 mmol/L (ref 22–32)
CREATININE: 1.18 mg/dL — AB (ref 0.44–1.00)
Chloride: 101 mmol/L (ref 101–111)
GFR calc Af Amer: 58 mL/min — ABNORMAL LOW (ref >60)
GFR, EST NON AFRICAN AMERICAN: 50 mL/min — AB (ref >60)
GLUCOSE: 115 mg/dL — AB (ref 65–99)
POTASSIUM: 4.1 mmol/L (ref 3.5–5.1)
SODIUM: 137 mmol/L (ref 135–145)

## 2016-06-21 NOTE — Progress Notes (Signed)
Patient ID: Bonnie Torres, female   DOB: Jul 04, 1955, 60 y.o.   MRN: [id number]  3 Days Post-Op Subjective: Pt transferred to floor yesterday.  Has not really ambulated.  No flatus.  Catheter still indwelling.  Objective: Vital signs in last 24 hours: Temp:  [98.4 F (36.9 C)-99 F (37.2 C)] 99 F (37.2 C) (12/24 0627) Pulse Rate:  [67-87] 69 (12/24 0627) Resp:  [13-26] 16 (12/24 0627) BP: (132-155)/(70-81) 132/70 (12/24 0627) SpO2:  [96 %-99 %] 99 % (12/24 0627) Arterial Line BP: (139-155)/(74-78) 155/78 (12/23 1500)  Intake/Output from previous day: 12/23 0701 - 12/24 0700 In: 1138.5 [I.V.:1138.5] Out: 2350 [Urine:2200; Drains:150] Intake/Output this shift: No intake/output data recorded.  Physical Exam:  General: Alert and oriented Abdomen: Soft, ND GU: Drain with serosanguineous drainage Ext: NT, No erythema  Lab Results:  Recent Labs  06/19/16 1557 06/20/16 0413 06/21/16 0515  HGB 10.4* 10.0* 10.3*  HCT 28.1* 27.3* 28.4*   BMET  Recent Labs  06/20/16 0413 06/21/16 0515  NA 135 137  K 3.7 4.1  CL 106 101  CO2 24 30  GLUCOSE 140* 115*  BUN 11 11  CREATININE 1.25* 1.18*  CALCIUM 8.4* 8.8*     Studies/Results: No results found.  Assessment/Plan: POD # 3 s/p right partial nephrectomy/resection of retroperitoneal tumor/cholecystectomy/TAH/BSO - Will d/c Foley and check drain Cr level - Ambulate - Path pending - Diet per Dr. Rolanda Jay - Anticipate she should be ready for D/C from GU standpoint in 24-48 hours if ambulating, pain controlled, and bowel function returned per [place].   LOS: 3 days   Mate Alegria,LES 06/21/2016, 9:30 AM

## 2016-06-21 NOTE — Progress Notes (Signed)
S:  No specific complaints.  No flatus/BM.  No N/V.  OOB to chair yesterday.  Good pain control. O: Tm 99 BP 132-155/70-81 HR 60s-70s RR 13-16  I/O 1239/2350 UOP 2200 JP 150 Lungs: CTA equal BS CVS:  RRR 2+ distal pulses Abd: Abd round, soft, appropriately tender, no bowel sounds, slight tympani Ext:  No edema or calf tenderness WBC 11.8  Hgb 10.3  Plt 257 Na 135 K 3.9 Cl 106 CO2 24 Creat 1.62 BUN 13 Gluc 162  A:  Pt doing well.  Creat stable slightly down.  Diuresing well. P:  Fluids/Foley removal per urology.      Increase ambulation.      Awaiting return of bowel function prior to advancing diet.

## 2016-06-21 NOTE — Evaluation (Signed)
Physical Therapy Evaluation Patient Details Name: Bonnie Torres MRN: [id number] DOB: 1956/01/01 Today's Date: 06/21/2016   History of Present Illness  60 yo female s/p 12/21 exp lap, R partial nephrectomy, resection of retroperitoneal mass, cholecystectomy, total hysterectomy, bil salpingo-oophorectomy   Clinical Impression  On eval, pt required Min assist for mobility. She walked ~65 feet with a RW. Pt reported minimal pain with activity. Pt c/o nausea during session. Once back in room, after walking, pt vomited-RN made aware. Encouraged pt to walk in hallway with nursing as tolerated. Will follow and progress activity as tolerated.     Follow Up Recommendations Home health PT;Supervision - Intermittent (May not need follow up if she progresses well.)    Equipment Recommendations  Rolling walker with 5" wheels (possibly-continuing to assess)    Recommendations for Other Services       Precautions / Restrictions Precautions Precautions: Fall Precaution Comments: R drain Restrictions Weight Bearing Restrictions: No      Mobility  Bed Mobility Overal bed mobility: Needs Assistance Bed Mobility: Supine to Sit;Sit to Supine     Supine to sit: Min assist;HOB elevated Sit to supine: Min assist;HOB elevated   General bed mobility comments: Assist for trunk to upright and LEs onto bed. Increased time.   Transfers Overall transfer level: Needs assistance Equipment used: Rolling walker (2 wheeled) Transfers: Sit to/from Stand Sit to Stand: Min assist         General transfer comment: Assist to rise, stabilize, control descent. VCs safety, hand placement  Ambulation/Gait Ambulation/Gait assistance: Min assist Ambulation Distance (Feet): 65 Feet Assistive device: Rolling walker (2 wheeled) Gait Pattern/deviations: Step-through pattern;Decreased stride length     General Gait Details: slow gait speed. Unsteady.   Stairs            Wheelchair Mobility     Modified Rankin (Stroke Patients Only)       Balance                                             Pertinent Vitals/Pain Pain Assessment: Faces Faces Pain Scale: Hurts a little bit Pain Location: abdomen Pain Descriptors / Indicators: Sore Pain Intervention(s): Limited activity within patient's tolerance    Home Living Family/patient expects to be discharged to:: Private residence Living Arrangements: Parent;Children (pt planning to d/c to her mother's house) Available Help at Discharge: Family Type of Home: House       Home Layout: One level Home Equipment: None      Prior Function Level of Independence: Independent               Hand Dominance        Extremity/Trunk Assessment   Upper Extremity Assessment Upper Extremity Assessment: Generalized weakness    Lower Extremity Assessment Lower Extremity Assessment: Generalized weakness    Cervical / Trunk Assessment Cervical / Trunk Assessment: Normal  Communication   Communication: No difficulties  Cognition Arousal/Alertness: Awake/alert Behavior During Therapy: WFL for tasks assessed/performed Overall Cognitive Status: Within Functional Limits for tasks assessed                      General Comments      Exercises     Assessment/Plan    PT Assessment Patient needs continued PT services  PT Problem List Decreased strength;Decreased mobility;Decreased activity tolerance;Decreased balance;Decreased knowledge of use of DME;Pain  PT Treatment Interventions DME instruction;Gait training;Therapeutic activities;Therapeutic exercise;Patient/family education;Functional mobility training;Balance training    PT Goals (Current goals can be found in the Care Plan section)  Acute Rehab PT Goals Patient Stated Goal: to feel better PT Goal Formulation: With patient Time For Goal Achievement: 07/05/16 Potential to Achieve Goals: Good    Frequency Min 3X/week    Barriers to discharge        Co-evaluation               End of Session   Activity Tolerance: Other (comment) (Limited by pt generally not feeling well. She vomited once back in room) Patient left: in bed;with call bell/phone within reach;with nursing/sitter in room;with family/visitor present           Time: [id number] PT Time Calculation (min) (ACUTE ONLY): 23 min   Charges:   PT Evaluation $PT Eval Low Complexity: 1 Procedure PT Treatments $Gait Training: 8-22 mins   PT G Codes:        Weston Anna, MPT Pager: [phone]

## 2016-06-21 NOTE — Progress Notes (Signed)
Patientt was up with PT and had large amount of  "green" emesis according to therapist. Abdomen soft with unchanged amount of distention and hypo active bowel sounds. Zofran given and will continue to monitor. Eulas Post, RN

## 2016-06-22 LAB — CBC
HEMATOCRIT: 28 % — AB (ref 36.0–46.0)
Hemoglobin: 10.2 g/dL — ABNORMAL LOW (ref 12.0–15.0)
MCH: 27 pg (ref 26.0–34.0)
MCHC: 36.4 g/dL — AB (ref 30.0–36.0)
MCV: 74.1 fL — AB (ref 78.0–100.0)
Platelets: 264 10*3/uL (ref 150–400)
RBC: 3.78 MIL/uL — ABNORMAL LOW (ref 3.87–5.11)
RDW: 13 % (ref 11.5–15.5)
WBC: 9.4 10*3/uL (ref 4.0–10.5)

## 2016-06-22 LAB — TYPE AND SCREEN
ABO/RH(D): B POS
Antibody Screen: NEGATIVE
UNIT DIVISION: 0
Unit division: 0

## 2016-06-22 LAB — BASIC METABOLIC PANEL
Anion gap: 7 (ref 5–15)
BUN: 10 mg/dL (ref 6–20)
CALCIUM: 8.8 mg/dL — AB (ref 8.9–10.3)
CO2: 29 mmol/L (ref 22–32)
CREATININE: 0.87 mg/dL (ref 0.44–1.00)
Chloride: 101 mmol/L (ref 101–111)
GFR calc Af Amer: >60 mL/min (ref >60)
GFR calc non Af Amer: >60 mL/min (ref >60)
GLUCOSE: 121 mg/dL — AB (ref 65–99)
Potassium: 3.2 mmol/L — ABNORMAL LOW (ref 3.5–5.1)
Sodium: 137 mmol/L (ref 135–145)

## 2016-06-22 LAB — CREATININE, FLUID (PLEURAL, PERITONEAL, JP DRAINAGE): Creat, Fluid: 1 mg/dL

## 2016-06-22 NOTE — Progress Notes (Signed)
JP drain removed per order. Patient tolerated procedure well. Site clean. No leakage. 4x4 gauze dressing placed and secured with Medipore tape. Will monitor for leaking or bleeding.

## 2016-06-22 NOTE — Progress Notes (Signed)
Patient refuses to ambulate on hall way with each attempt, said she will ambulate on day shift.

## 2016-06-22 NOTE — Progress Notes (Signed)
S: One episode of emesis yesterday.  Currently no Nausea.  No flatus.  Good pain control. O:  Tm 99 BP 127-142 HR 63-75 RR 9-16 I/O 795/1450 JP 300 serosanguinous Lungs: CTA equal BS CVS: RRR 2+ distal pulses Abd: Round, soft, +tympani, appropriately tender WBC 9.4 Hgb 10.2 Plt 264 Na 137 K 3.2 Cl 101 CO2 29 Creat 0.87 Creat 10 Gluc 121 A:  POD #4 Doing well P:  Awaiting return of bowel function then will advance diet.      Ambulate.  IS

## 2016-06-22 NOTE — Progress Notes (Signed)
Patient ID: Bonnie Torres, female   DOB: 1955/11/29, 60 y.o.   MRN: [id number]  4 Days Post-Op Subjective: Pt voiding well since catheter removal.  Drain Cr not sent yesterday.  Ambulating better.  Still with nausea yesterday.  No flatus.  Objective: Vital signs in last 24 hours: Temp:  [97.9 F (36.6 C)-99 F (37.2 C)] 98 F (36.7 C) (12/25 0528) Pulse Rate:  [63-75] 64 (12/25 0528) Resp:  [9-16] 12 (12/25 0528) BP: (127-142)/(69-80) 127/76 (12/25 0528) SpO2:  [95 %-100 %] 98 % (12/25 0528)  Intake/Output from previous day: 12/24 0701 - 12/25 0700 In: 795 [P.O.:240; I.V.:555] Out: 1450 [Urine:1150; Drains:300] Intake/Output this shift: No intake/output data recorded.  Physical Exam:  General: Alert and oriented Abd: Soft, ND Drain: Minimal serosanguineous fluid  Lab Results:  Recent Labs  06/20/16 0413 06/21/16 0515 06/22/16 0425  HGB 10.0* 10.3* 10.2*  HCT 27.3* 28.4* 28.0*    BMET  Recent Labs  06/21/16 0515 06/22/16 0425  NA 137 137  K 4.1 3.2*  CL 101 101  CO2 30 29  GLUCOSE 115* 121*  BUN 11 10  CREATININE 1.18* 0.87  CALCIUM 8.8* 8.8*     Studies/Results: No results found.  Assessment/Plan: POD # 4 s/p right partial nephrectomy/TAH/BSO/resection of retroperitoneal mass/cholecystectomy - Pt recovering well from GU standpoint - Will make sure drain Cr level is sent today and will consider drain removal if c/w serum - Diet per surgical oncology - Pathology pending   LOS: 4 days   Lexy Meininger,LES 06/22/2016, 7:33 AM

## 2016-06-22 NOTE — Progress Notes (Signed)
Patient ID: Bonnie Torres, female   DOB: 1956-01-24, 60 y.o.   MRN: [id number]   Drain Cr 1.0 - drain removed.  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  Prognosis is good.  Will need surveillance f/u in the future which I will arrange.  I am off the next few days.  The patient is ok for d/c from a GU standpoint when ok with Surgical Oncology.  I will be available if any further GU questions.  No special post-op care from GU standpoint.  Pt has f/u with me at 1:30 on 07/14/16.

## 2016-06-23 LAB — BASIC METABOLIC PANEL
Anion gap: 9 (ref 5–15)
BUN: 8 mg/dL (ref 6–20)
CALCIUM: 8.7 mg/dL — AB (ref 8.9–10.3)
CHLORIDE: 98 mmol/L — AB (ref 101–111)
CO2: 27 mmol/L (ref 22–32)
CREATININE: 0.8 mg/dL (ref 0.44–1.00)
GFR calc Af Amer: >60 mL/min (ref >60)
GFR calc non Af Amer: >60 mL/min (ref >60)
Glucose, Bld: 99 mg/dL (ref 65–99)
Potassium: 3.5 mmol/L (ref 3.5–5.1)
SODIUM: 134 mmol/L — AB (ref 135–145)

## 2016-06-23 LAB — CBC
HCT: 27 % — ABNORMAL LOW (ref 36.0–46.0)
HEMOGLOBIN: 9.9 g/dL — AB (ref 12.0–15.0)
MCH: 27.1 pg (ref 26.0–34.0)
MCHC: 36.7 g/dL — AB (ref 30.0–36.0)
MCV: 74 fL — ABNORMAL LOW (ref 78.0–100.0)
Platelets: 276 10*3/uL (ref 150–400)
RBC: 3.65 MIL/uL — ABNORMAL LOW (ref 3.87–5.11)
RDW: 12.9 % (ref 11.5–15.5)
WBC: 7.3 10*3/uL (ref 4.0–10.5)

## 2016-06-23 NOTE — Progress Notes (Signed)
Spoke with pt concerning [place] pt would like home health RN, NA, PT to come. MD need home health orders and a face to face for Home Health please. Pt will go to her mother's home at discharge [address] Rondall Allegra, [place]. [facility] was selected for [facility] needs.

## 2016-06-23 NOTE — Progress Notes (Signed)
S:  Tol liq.  No flatus.  No N/V  Drain removed. O: Temp:  [98.4 F (36.9 C)] 98.4 F (36.9 C) (12/26 0531) Pulse Rate:  [64-69] 69 (12/26 0531) Resp:  [8-16] 10 (12/26 0531) BP: (130-140)/(76-80) 130/79 (12/26 0531) SpO2:  [95 %-100 %] 98 % (12/26 0531)  Intake/Output from previous day: 12/25 0701 - 12/26 0700 In: 1795 [P.O.:600; I.V.:1195] Out: 2660 [Urine:2575; Drains:85] Lungs:  CTA equal BS CVS:  RRR 2+ distal pulses Abd:  Round, Soft, appropriately tender Ext:  No edema, no calf tenderness Hgb stable. A:  Patient doing well.   P:  Continue ambulation.  Once passing flatus will advance diet as tolerating. OK to HL IV and advance to full diet as tol when passing flatus and/or BM and D/C PCA with Percocet for pain.

## 2016-06-23 NOTE — Care Management Important Message (Signed)
Important Message  Patient Details  Name: Bonnie Torres MRN: [id number] Date of Birth: November 27, 1955   Medicare Important Message Given:  Yes    Kerin Salen 06/23/2016, 11:42 AMImportant Message  Patient Details  Name: Bonnie Torres MRN: [id number] Date of Birth: 07-27-1955   Medicare Important Message Given:  Yes    Kerin Salen 06/23/2016, 11:42 AM

## 2016-06-23 NOTE — Progress Notes (Signed)
5 Days Post-Op Procedure(s) (LRB): RESECTION RETROPERITONEAL MASS WITH RETORPERITONEAL EXPLORATION ,LIGATION CHYLI CISTERNA,OPEN  CHOLECYSTECTOMY WITH MOBILATION HEPATIC FLEXURE (N/A) HYSTERECTOMY ABDOMINAL TOTAL (N/A) BILATERAL SALPINGO OOPHORECTOMY (Bilateral) OPEN RIGHT NEPHRECTOMY PARTIAL (Right)  Subjective: Patient reports feeling generally well postop. Has only walked twice since surgery (including only once yesterday). She has not passed flatus. Last emesis was 06/21/16. Tolerating clears x 24 hours. Small amount of vaginal bleeding. Pain well controlled.    Objective: Vital signs in last 24 hours: Temp:  [98.4 F (36.9 C)] 98.4 F (36.9 C) (12/26 0531) Pulse Rate:  [64-69] 69 (12/26 0531) Resp:  [8-16] 10 (12/26 0531) BP: (130-140)/(76-80) 130/79 (12/26 0531) SpO2:  [95 %-100 %] 98 % (12/26 0531)    Intake/Output from previous day: 12/25 0701 - 12/26 0700 In: 1795 [P.O.:600; I.V.:1195] Out: 2660 [Urine:2575; Drains:85]  Physical Examination: General: alert and cooperative Resp: clear to auscultation bilaterally and normal percussion bilaterally Cardio: regular rate and rhythm, S1, S2 normal, no murmur, click, rub or gallop GI: soft, non-tender; bowel sounds normal; no masses,  no organomegaly incision clean, dry, intact with dressing in place. No drainage. JP with serosanguinou output.  Labs: WBC/Hgb/Hct/Plts:  7.3/9.9/27.0/276 (12/26 0421) BUN/Cr/glu/ALT/AST/amyl/lip:  8/0.80/--/--/--/--/-- (12/26 0421) CMP Latest Ref Rng & Units 06/23/2016 06/22/2016 06/21/2016  Glucose 65 - 99 mg/dL 99 121(H) 115(H)  BUN 6 - 20 mg/dL 8 10 11   Creatinine 0.44 - 1.00 mg/dL 0.80 0.87 1.18(H)  Sodium 135 - 145 mmol/L 134(L) 137 137  Potassium 3.5 - 5.1 mmol/L 3.5 3.2(L) 4.1  Chloride 101 - 111 mmol/L 98(L) 101 101  CO2 22 - 32 mmol/L 27 29 30   Calcium 8.9 - 10.3 mg/dL 8.7(L) 8.8(L) 8.8(L)  Total Protein 6.5 - 8.1 g/dL - - -  Total Bilirubin 0.3 - 1.2 mg/dL - - -  Alkaline Phos 38  - 126 U/L - - -  AST 15 - 41 U/L - - -  ALT 14 - 54 U/L - - -     Assessment:  60 y.o. s/p Procedure(s): RESECTION RETROPERITONEAL MASS WITH RETORPERITONEAL EXPLORATION ,LIGATION CHYLI CISTERNA,OPEN  CHOLECYSTECTOMY WITH MOBILATION HEPATIC FLEXURE HYSTERECTOMY ABDOMINAL TOTAL BILATERAL SALPINGO OOPHORECTOMY OPEN RIGHT NEPHRECTOMY PARTIAL: stable Pain:  Pain is well-controlled on IVmedications.  Heme:Anemia: acute blood loss anemia - stable Hb over 24 hours. No signs of active bleeding.  CV: hemodynamically stable.   GI:  Tolerating po: Yes. No flatus but also no emesis for >24 hours. Tolerating clears. Abdomen distended and continued minimal bowel sounds, likely delayed return of bowel function.  Has been minimally ambulating (likely due to the fact that she is on telemetry).  Discussed with patient the importance of ambulating MULTIPLE times per day.  FEN: continue IVF until tolerating oral  [id number] creatinine. RCC (right) - management per urology.  Gyn: vaginal bleeding consistent with s/p hysterectomy state. Final pathology confirmed no evidence of malignancy (including no source of increased testosterone levels). Recommend rechecking testosterone at postop check to evaluate for resolution - if remains high, would recommend endocrinology referral.  Prophylaxis: Lovenox prophylaxis restarted upon documentation of stable H&H. Continue while an inpatient. Does not require prolonged outpatient prophylaxis from a gyn standpoint.  Plan: Continue advancement of diet per Dr Rolanda Jay Dispo:  Discharge plan to include  The patient is to be discharged to home likely POD 58-6 (pending return of bowel function & tolerance of regular diet).   LOS: 5 days    Bonnie Torres 06/23/2016, 7:23 AM

## 2016-06-24 MED ORDER — ENOXAPARIN SODIUM 40 MG/0.4ML ~~LOC~~ SOLN: 40.0000 mg | SUBCUTANEOUS | 0 refills | Status: DC

## 2016-06-24 MED ORDER — OXYCODONE-ACETAMINOPHEN 10-325 MG PO TABS: 1.0000 | ORAL_TABLET | Freq: Four times a day (QID) | ORAL | 0 refills | Status: DC | PRN

## 2016-06-24 MED ORDER — OXYCODONE HCL 5 MG PO TABS
5.0000 mg | ORAL_TABLET | Freq: Four times a day (QID) | ORAL | Status: DC | PRN
  Administered 2016-06-24: 5 mg via ORAL
  Filled 2016-06-24: qty 1

## 2016-06-24 MED ORDER — OXYCODONE-ACETAMINOPHEN 5-325 MG PO TABS
1.0000 | ORAL_TABLET | Freq: Four times a day (QID) | ORAL | Status: DC | PRN
  Administered 2016-06-24: 1 via ORAL
  Filled 2016-06-24: qty 1

## 2016-06-24 NOTE — Progress Notes (Signed)
Physical Therapy Treatment Patient Details Name: Bonnie Torres MRN: [id number] DOB: 1956/06/05 Today's Date: 2016/07/05    History of Present Illness 60 yo female s/p 12/21 exp lap, R partial nephrectomy, resection of retroperitoneal mass, cholecystectomy, total hysterectomy, bil salpingo-oophorectomy     PT Comments    Pt's mobility has improved.  Pt tolerating ambulation well and no longer requires RW.  Pt states she plans to d/c to her mother's home (no stairs).  Pt likely to d/c home today and had no concerns about mobility upon d/c.  Follow Up Recommendations  No PT follow up     Equipment Recommendations  None recommended by PT    Recommendations for Other Services       Precautions / Restrictions Precautions Precautions: None    Mobility  Bed Mobility Overal bed mobility: Modified Independent                Transfers Overall transfer level: Modified independent                  Ambulation/Gait Ambulation/Gait assistance: Supervision Ambulation Distance (Feet): 400 Feet Assistive device: None Gait Pattern/deviations: Step-through pattern;Decreased stride length     General Gait Details: slow gait speed but steady today, no assistive device required   Stairs            Wheelchair Mobility    Modified Rankin (Stroke Patients Only)       Balance Overall balance assessment: No apparent balance deficits (not formally assessed);Needs assistance                             High Level Balance Comments: pt able to ambulate backwards a few steps and reach over bed to grab cell phone on side table at least twice    Cognition Arousal/Alertness: Awake/alert Behavior During Therapy: [facility] for tasks assessed/performed Overall Cognitive Status: Within Functional Limits for tasks assessed                      Exercises      General Comments        Pertinent Vitals/Pain Pain Assessment: No/denies pain    Home  Living                      Prior Function            PT Goals (current goals can now be found in the care plan section) Progress towards PT goals: Progressing toward goals    Frequency    Min 3X/week      PT Plan Discharge plan needs to be updated    Co-evaluation             End of Session   Activity Tolerance: Patient tolerated treatment well Patient left: in chair;with call bell/phone within reach     Time: 1012-1025 PT Time Calculation (min) (ACUTE ONLY): 13 min  Charges:  $Gait Training: 8-22 mins                    G Codes:      Elham Fini,KATHrine E 2016/07/05, 12:20 PM Carmelia Bake, PT, DPT 2016/07/05 Pager: [phone]

## 2016-06-24 NOTE — Progress Notes (Signed)
Pt's co pay for Lovenox is $95.00. Pt is aware.

## 2016-06-24 NOTE — Progress Notes (Signed)
S: Good pain control.  + Flatus.   O: Tm 98.5 BP 120-134/68-74 HR 63-74 RR 8-15 I/O 1700/900 Lungs:  CTA equal BS CVS: RRR 2+ distal pulses Abd:  Soft, round, appropriately tender, incision C/D/I Ext:  No edema, no calf tenderness A:  Pt doing well P:  Will d/c PCA/IVF advance reg diet.  If tol then d/c home today. Lovenox teaching/home health.

## 2016-06-24 NOTE — Discharge Summary (Signed)
  Physician Discharge Summary  Patient ID: Bonnie Torres MRN: [id number] DOB/AGE: Aug 07, 1955 60 y.o.  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  good  Hospital Course: The patient had an uncomplicated procedure and post operative course.  By POD#5 she had return of bowel function and was able to have diet advanced and be discharged home.  Consults: None  Significant Diagnostic Studies: None  Treatments: Exploratory laparotomy.  Cholecystectomy, resection of retroperitoneal sarcoma. Partial right nephrectomy and TAH/BSO. Discharge Exam: Blood pressure 134/74, pulse 71, temperature 98.5 F (36.9 C), temperature source Oral, resp. rate 10, height 4\' 11"  (1.499 m), weight 74.8 kg (165 lb), SpO2 100 %. Lungs:  CTA equal BS CVS: RRR 2+ distal pulses Abd:  Soft, round, appropriately tender, incision C/D/I Ext:  No edema, no calf tenderness  Disposition: Final discharge disposition not confirmed    Follow-up Information    Donaciano Eva, MD Follow up on 07/20/2016.   Specialty:  Obstetrics and Gynecology Why:  at 12:15pm at the [facility] for post-op follow up Contact information: [address]  [place] [address] [phone]        [name], MD Follow up.   Specialty:  Urology Why:  07/14/16 at 1:30 PM at [facility] Contact information: [place] [address] [phone]           Greater than thirty minutes were spend for face to face discharge instructions and discharge orders/summary in EPIC.   SignedFrederich Cha 06/24/2016, 7:42 AM

## 2016-06-24 NOTE — Discharge Instructions (Addendum)
06/24/2016  Return to work: 4-6 weeks if applicable  Activity: 1. Be up and out of the bed during the day.  Take a nap if needed.  You may walk up steps but be careful and use the hand rail.  Stair climbing will tire you more than you think, you may need to stop part way and rest.   2. No lifting over 15 lbs or straining for 6 weeks.  3. No driving for 2 week(s) from surgery.  Do not drive if you are taking narcotic pain medicine.  4. Shower daily.  Use soap and water on your incision and pat dry; don't rub.  No tub baths until cleared by your surgeon.   5. No sexual activity and nothing in the vagina for 6 weeks.  6. You may experience a small amount of clear drainage from your incisions, which is normal.  If the drainage persists or increases, please call the office.  7. You may experience vaginal spotting after surgery or around the 6-8 week mark from surgery when the stitches at the top of the vagina begin to dissolve.  The spotting is normal but if you experience heavy bleeding, call our office.  Diet: 1. Low sodium Heart Healthy Diet is recommended.  2. It is safe to use a laxative, such as Miralax or Colace, if you have difficulty moving your bowels.   Wound Care: 1. Keep clean and dry.  Shower daily.  Reasons to call the Doctor:  Fever - Oral temperature greater than 100.4 degrees Fahrenheit  Foul-smelling vaginal discharge  Difficulty urinating  Nausea and vomiting  Increased pain at the site of the incision that is unrelieved with pain medicine.  Difficulty breathing with or without chest pain  New calf pain especially if only on one side  Sudden, continuing increased vaginal bleeding with or without clots.   Contacts: For questions or concerns you should contact:  Dr. Marcelina Morel C. [place] at [phone] or cell [phone]  Dr. [name] at [phone] with [facility]  Dr. Everitt Amber at [phone] with [place], NP at  [phone]  After Hours: call [phone] and have the GYN Oncologist paged/contacted

## 2016-06-26 DIAGNOSIS — M359 Systemic involvement of connective tissue, unspecified: Secondary | ICD-10-CM | POA: Diagnosis not present

## 2016-06-26 DIAGNOSIS — J45909 Unspecified asthma, uncomplicated: Secondary | ICD-10-CM | POA: Diagnosis not present

## 2016-06-26 DIAGNOSIS — Z7901 Long term (current) use of anticoagulants: Secondary | ICD-10-CM | POA: Diagnosis not present

## 2016-06-26 DIAGNOSIS — Z483 Aftercare following surgery for neoplasm: Secondary | ICD-10-CM | POA: Diagnosis not present

## 2016-06-26 DIAGNOSIS — Z791 Long term (current) use of non-steroidal anti-inflammatories (NSAID): Secondary | ICD-10-CM | POA: Diagnosis not present

## 2016-06-26 DIAGNOSIS — E119 Type 2 diabetes mellitus without complications: Secondary | ICD-10-CM | POA: Diagnosis not present

## 2016-06-26 DIAGNOSIS — C480 Malignant neoplasm of retroperitoneum: Secondary | ICD-10-CM | POA: Diagnosis not present

## 2016-06-26 DIAGNOSIS — C7901 Secondary malignant neoplasm of right kidney and renal pelvis: Secondary | ICD-10-CM | POA: Diagnosis not present

## 2016-06-26 DIAGNOSIS — C9000 Multiple myeloma not having achieved remission: Secondary | ICD-10-CM | POA: Diagnosis not present

## 2016-06-26 DIAGNOSIS — I509 Heart failure, unspecified: Secondary | ICD-10-CM | POA: Diagnosis not present

## 2016-06-26 DIAGNOSIS — I110 Hypertensive heart disease with heart failure: Secondary | ICD-10-CM | POA: Diagnosis not present

## 2016-06-30 ENCOUNTER — Telehealth: Payer: Self-pay | Admitting: Gynecologic Oncology

## 2016-06-30 NOTE — Telephone Encounter (Signed)
Spoke with patient.  Doing well post-operatively.  States bowels and bladder functioning without difficulty.  No pain reported.  Follow up appt confirmed for Tuesday, Jan 9.  Advised to call for any needs or concerns.

## 2016-07-02 DIAGNOSIS — Z483 Aftercare following surgery for neoplasm: Secondary | ICD-10-CM | POA: Diagnosis not present

## 2016-07-02 DIAGNOSIS — J45909 Unspecified asthma, uncomplicated: Secondary | ICD-10-CM | POA: Diagnosis not present

## 2016-07-02 DIAGNOSIS — C9000 Multiple myeloma not having achieved remission: Secondary | ICD-10-CM | POA: Diagnosis not present

## 2016-07-02 DIAGNOSIS — M359 Systemic involvement of connective tissue, unspecified: Secondary | ICD-10-CM | POA: Diagnosis not present

## 2016-07-02 DIAGNOSIS — C480 Malignant neoplasm of retroperitoneum: Secondary | ICD-10-CM | POA: Diagnosis not present

## 2016-07-02 DIAGNOSIS — C7901 Secondary malignant neoplasm of right kidney and renal pelvis: Secondary | ICD-10-CM | POA: Diagnosis not present

## 2016-07-02 DIAGNOSIS — Z791 Long term (current) use of non-steroidal anti-inflammatories (NSAID): Secondary | ICD-10-CM | POA: Diagnosis not present

## 2016-07-02 DIAGNOSIS — I509 Heart failure, unspecified: Secondary | ICD-10-CM | POA: Diagnosis not present

## 2016-07-02 DIAGNOSIS — E119 Type 2 diabetes mellitus without complications: Secondary | ICD-10-CM | POA: Diagnosis not present

## 2016-07-02 DIAGNOSIS — I110 Hypertensive heart disease with heart failure: Secondary | ICD-10-CM | POA: Diagnosis not present

## 2016-07-02 DIAGNOSIS — Z7901 Long term (current) use of anticoagulants: Secondary | ICD-10-CM | POA: Diagnosis not present

## 2016-07-07 ENCOUNTER — Encounter: Payer: Self-pay | Admitting: General Surgery

## 2016-07-07 ENCOUNTER — Ambulatory Visit (HOSPITAL_BASED_OUTPATIENT_CLINIC_OR_DEPARTMENT_OTHER): Payer: Self-pay | Admitting: General Surgery

## 2016-07-07 VITALS — BP 112/67 | HR 84 | Temp 98.9°F | Resp 17

## 2016-07-07 DIAGNOSIS — R1900 Intra-abdominal and pelvic swelling, mass and lump, unspecified site: Secondary | ICD-10-CM

## 2016-07-07 NOTE — Patient Instructions (Signed)
Plan to follow up in two weeks to discuss path, etc.  Please call for any questions or concerns.

## 2016-07-08 DIAGNOSIS — J45909 Unspecified asthma, uncomplicated: Secondary | ICD-10-CM | POA: Diagnosis not present

## 2016-07-08 DIAGNOSIS — M359 Systemic involvement of connective tissue, unspecified: Secondary | ICD-10-CM | POA: Diagnosis not present

## 2016-07-08 DIAGNOSIS — I509 Heart failure, unspecified: Secondary | ICD-10-CM | POA: Diagnosis not present

## 2016-07-08 DIAGNOSIS — E119 Type 2 diabetes mellitus without complications: Secondary | ICD-10-CM | POA: Diagnosis not present

## 2016-07-08 DIAGNOSIS — Z483 Aftercare following surgery for neoplasm: Secondary | ICD-10-CM | POA: Diagnosis not present

## 2016-07-08 DIAGNOSIS — I110 Hypertensive heart disease with heart failure: Secondary | ICD-10-CM | POA: Diagnosis not present

## 2016-07-08 DIAGNOSIS — C7901 Secondary malignant neoplasm of right kidney and renal pelvis: Secondary | ICD-10-CM | POA: Diagnosis not present

## 2016-07-08 DIAGNOSIS — C9000 Multiple myeloma not having achieved remission: Secondary | ICD-10-CM | POA: Diagnosis not present

## 2016-07-08 DIAGNOSIS — Z791 Long term (current) use of non-steroidal anti-inflammatories (NSAID): Secondary | ICD-10-CM | POA: Diagnosis not present

## 2016-07-08 DIAGNOSIS — C480 Malignant neoplasm of retroperitoneum: Secondary | ICD-10-CM | POA: Diagnosis not present

## 2016-07-08 DIAGNOSIS — Z7901 Long term (current) use of anticoagulants: Secondary | ICD-10-CM | POA: Diagnosis not present

## 2016-07-09 NOTE — Progress Notes (Signed)
I had the pleasure of meeting with Bonnie Torres today in the office with her family. She is done exceptionally well since surgery and reports no specific complaints. She indicated that she is only taken 1 pain pill since discharge. She specifically denies any nausea, vomiting, GI discomfort or abnormalities associated with the incision. She is scheduled to follow-up with Drs. Denman George and Borden in the near future.  She reports that her hirsutism appears to be slightly improved however she is not convinced it is resolved.   Physical examination: Performed with female chaperone in the room Abdomen: Round, soft, incision is clean, dry without erythema warmth or discharge.  Pathology: The results of her total abdominal hysterectomy and bilateral salpingo-oophorectomy will be discussed by Dr. Denman George with the patient. Similarly, the results of the right partial nephrectomy will be discussed with Dr. Alinda Money with the patient. I have discussed the case with Dr. Cristina Gong who indicated it has been sent out for second opinion. She did indicate there was a large lymphocytic component accompanied by a fatty component.  Assessment/Plan: I will plan to see the patient back in the office in approximate 2 weeks to reassess the hirsutism and if still present we'll refer her to endocrinology. At that time we will have the final report for pathology. The patient understands and agrees the plan as outlined above.   Addendum: At the time of dictation after the patient had left clinic the final pathology report was read out. 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 Sections show adipocytes admixed with spindled and epithelioid cells in a homogenous, myxoid matrix. The individual adipocytes do not  exhibit prominent lipoblast formation, but the background changes are reminiscent of those seen in a spindle cell lipoma. Foci of edema are present within this lesion which is likely a reactive change due to chronic lymphedema. I cannot absolutely exclude a well differentiated liposarcoma/atypical lipomatous tumor, but a spindle cell lipoma would be in the differential. Dr. Lyndon Code has similar opinion. For this reason, this lesion was be sent to Dr. Jeani Hawking at [facility] clinic for his expert opinion. The report is now finalized (see below). (NDS:kh 06/24/16) This lesion was sent to Dr. Jeani Hawking at [facility] for his expert opinion. He interprets this as an intraabdominal mass as a benign adipocytic proliferation with myxoid change and an increased number of abnormally configured lymphatic vessels, most likely representing a lymphangioma with a prominent adipocytic component. Fluorescence in situ hybridization for MDM2 amplification performed at [facility] is negative. The cytogenetic FISH is usually positive in atypical lipomatous neoplasms/liposarcoma and for this reason, this is interpreted as a benign lymphangiomatous proliferation with entrapment of fat. His official opinion is as follows: "I reviewed this as a presenting intraabdominal mass near the cisterna Chyle from the above patient and can only approach this unusual, benign appearing fat rich proliferation in a descriptive fashion. As you have indicated there are some areas within the lesion which do vaguely resemble spindle cell lipoma, and as I have seen spindle cell lipoma-like areas in well differentiated liposarcoma, I thought it reasonable to perform fluorescence in situ hybridization for MDM-2 amplification. That study was negative. In light of this negative molecular result and given the location of the lesion, I was struck by the rather large number of abnormally configured lymphatic-type vessels present in this lesion, and  areas associated with clusters of small lymphocytes. These changes are quite similar to the areas that may be seen in lymphangioma having a very prominent adipocytic component. As you know mature fat is frequently seen in association with a variety of benign vascular tumors and malformations. Although it is difficult to be certain, I suspect that is what we are dealing with." Thus, the sarcoma table is not performed as this is interpreted as a benign lesion. This final report is noted. (NDS:kh   Assessment and plan: Based on this interpretation this appears to be benign lesion and would not require specific imaging follow-up. I will defer to Drs. Denman George and Borden regarding the need for surveillance imaging. When I see the patient back in clinic I will review these findings with her.

## 2016-07-19 NOTE — Progress Notes (Signed)
Follow-up Note: Gyn-Onc  Consult was requested by Dr. Dalbert Batman for the evaluation of Bonnie Torres 61 y.o. female  CC:  Chief Complaint  Patient presents with  . fibroid uterus    postop  . virilization    Assessment/Plan:  [name]  is a 61 y.o.  year old with a history of a large abdominal, renal and adnexal masses and virilization with elevated serum testosterone.  She is s/p TAH, BSO which was performed on 06/18/16 at the time of a right partial nephrectomy for renal cell carcinoma and resection of retroperitoneal mass. Final pathology revealed no malignancy, no sertoli-leydig tumor and only a corpus luteum. No pathology was identified to explain the elevated testosterone.  We will recheck serum testosterone today.   If normalized, no further work-up is necessary.  If elevated, I recommend follow-up with an endocrinologist for further work-up of the virilization and elevated serum testosterone.   She will return to see her OBGYN provider annually for routine well-woman care.   HPI: Bonnie Torres is a 61 year old woman who is seen in consultation at the request of Dr Fanny Skates for large adnexal masses in addition to a right renal mass, and a subhepatic abdominal mass.  THe patient is largely asymptomatic from her masses, however reports virilization (with hirsutism and clitoromegaly) for approximately 1 year prior to diagnosis. Serum testosterone is elevated at 412 on 03/23/16. She is postmenopausal (LMP 2012)  Of note CA 125 is normal at 25 (04/13/16) as were other germ cell tumor markers (AFP, HCG, LDH).  Imaging performed on 04/08/16 with CT abdomen and pelvis showed Several small thyroid nodules, a 4.6 x 4.2 cm enhancing mass in the mid pole of the right kidney concerning for renal cell carcinoma. The dominant mass in the abdomen was a 17.3 x 10.2 x 17.5 cm large mixed soft tissue and fatty density mass with mass effect upon the left lobe of the liver and the  stomach. The pelvis reveals a enlarged uterus with numerous calcified and noncalcified fibroids. There is a rim calcified exophytic subserosal fibroid on the left measuring 3.8 cm, and a mixed density probably solid partially enhancing mass within the right adnexa measuring 5.5 x 3.7 cm. It is difficult to determine if this is a right ovary or a pedunculated fibroid.   She was seen by Dr Rolanda Jay who recommended surgical excision with exploratory laparotomy. She has also sought consultation with Dr Alinda Money from [facility] who recommended right nephrectomy/partial nephrectomy.    She has a family history significant for a sister with epithelial ovarian cancer and a first cousin with ovarian cancer (both in their 37's).   Interval Hx:   On 06/18/16 she underwent exploratory laparotomy, resection of retroperitoneal mass, partial right nephrectomy and total abdominal hysterectomy and BSO. A 30cm retroperitoneal mass was removed from the suprarenal vena cava with pathology showing lymphangioma. The uterus was 16cm bulky fibroid uterus with benign ovarian cysts bilaterally. Pathology was all benign from the gynecologic specimens, and no stromal cell tumors were identified on final pathology. The right kidney resection revealed a renal cell carcinoma with negative margins.   Since surgery she is doing fairly well. She thinks the virilization/hirsuitism may be somewhat improved. She had some diarrhea last week, but this has improved/resolved.   Current Meds:  Outpatient Encounter Prescriptions as of 07/20/2016  Medication Sig  . allopurinol (ZYLOPRIM) 100 MG tablet Take 1 tablet by mouth  daily  . cloNIDine (CATAPRES) 0.3 MG  tablet Take 1 tablet (0.3 mg total) by mouth 2 (two) times daily. (Patient taking differently: Take 0.3 mg by mouth at bedtime. )  . colchicine 0.6 MG tablet Take 1 tablet by mouth  daily  . meloxicam (MOBIC) 15 MG tablet Take 1 tablet (15 mg total) by mouth daily. As needed for  back pain  . metoprolol (LOPRESSOR) 100 MG tablet TAKE 1 TABLET(100 MG) BY MOUTH TWICE DAILY  . nystatin ointment (MYCOSTATIN) Apply 1 application topically 2 (two) times daily.  . Olmesartan-Amlodipine-HCTZ 20-5-12.5 MG TABS TAKE 1 TABLET BY MOUTH  DAILY  . potassium chloride SA (K-DUR,KLOR-CON) 20 MEQ tablet TAKE 1 TABLET(20 MEQ) BY MOUTH DAILY  . pravastatin (PRAVACHOL) 40 MG tablet Take one tablet by mouth daily. (Patient taking differently: Take one tablet by mouth daily.)   No facility-administered encounter medications on file as of 07/20/2016.     Allergy: No Known Allergies  Social Hx:   Social History   Social History  . Marital status: Widowed    Spouse name: N/A  . Number of children: N/A  . Years of education: N/A   Occupational History  . Not on file.   Social History Main Topics  . Smoking status: Never Smoker  . Smokeless tobacco: Never Used  . Alcohol use No  . Drug use: No  . Sexual activity: Not Currently   Other Topics Concern  . Not on file   Social History Narrative  . No narrative on file    Past Surgical Hx:  Past Surgical History:  Procedure Laterality Date  . ABDOMINAL HYSTERECTOMY N/A 06/18/2016   Procedure: HYSTERECTOMY ABDOMINAL TOTAL;  Surgeon: Everitt Amber, MD;  Location: [facility];  Service: Gynecology;  Laterality: N/A;  . BREAST SURGERY Right    breast biopsy-benign  . MASS EXCISION N/A 06/18/2016   Procedure: RESECTION RETROPERITONEAL MASS WITH RETORPERITONEAL EXPLORATION ,LIGATION CHYLI CISTERNA,OPEN  CHOLECYSTECTOMY WITH MOBILATION HEPATIC FLEXURE;  Surgeon: Hall Busing, MD;  Location: [facility];  Service: General;  Laterality: N/A;  . PARTIAL NEPHRECTOMY Right 06/18/2016   Procedure: OPEN RIGHT NEPHRECTOMY PARTIAL;  Surgeon: Raynelle Bring, MD;  Location: [facility];  Service: Urology;  Laterality: Right;  . SALPINGOOPHORECTOMY Bilateral 06/18/2016   Procedure: BILATERAL SALPINGO OOPHORECTOMY;  Surgeon: Everitt Amber, MD;  Location: [facility];   Service: Gynecology;  Laterality: Bilateral;  . SHOULDER SURGERY Right 2010  . TOE SURGERY Right Hammer Toe   2004  . TONSILLECTOMY      Past Medical Hx:  Past Medical History:  Diagnosis Date  . Gout    1 yr ago right great toe  . Heart rate fast    tx Metoprolol  . Hyperlipidemia   . Hypertension   . Retroperitoneal sarcoma ([place]) 06/18/2016    Past Gynecological History:  SVD x 1. Denies history of fibroids or menorrhagia. LMP 2012 No LMP recorded. Patient is postmenopausal.  Family Hx:  Family History  Problem Relation Age of Onset  . Heart attack Paternal Aunt   . Hypertension Mother   . Diabetes Maternal Aunt   . Cancer Paternal Aunt     stomach  . Cancer Sister     Review of Systems:  Constitutional  Feels well,    ENT Normal appearing ears and nares bilaterally Skin/Breast  + hirsutism Cardiovascular  No chest pain, shortness of breath, or edema  Pulmonary  No cough or wheeze.  Gastro Intestinal  No nausea, vomitting, or diarrhoea. No bright red blood per rectum, no abdominal pain,  change in bowel movement, or constipation.  Genito Urinary  No frequency, urgency, dysuria, no bleeding, no pelvic pressure Musculo Skeletal  No myalgia, arthralgia, joint swelling or pain  Neurologic  No weakness, numbness, change in gait,  Psychology  No depression, anxiety, insomnia.   Vitals:  Blood pressure 133/81, pulse 79, temperature 98.2 F (36.8 C), temperature source Oral, resp. rate 18, height 4\' 11"  (1.499 m), weight 147 lb 14.4 oz (67.1 kg), SpO2 100 %.  Physical Exam: Shaved stubble on face and abdomen consistent with hirsutism. Neck  Supple NROM, without any enlargements.  Lymph Node Survey No cervical supraclavicular or inguinal adenopathy Cardiovascular  Pulse normal rate, regularity and rhythm. S1 and S2 normal.  Lungs  Clear to auscultation bilateraly, without wheezes/crackles/rhonchi. Good air movement.  Skin  No rash/lesions/breakdown   Psychiatry  Alert and oriented to person, place, and time  Abdomen  Normoactive bowel sounds, abdomen soft, non-tender and overweight without evidence of hernia. Vertical midline incision well healed. Back No CVA tenderness Genito Urinary : vaginal cuff in tact and well healed Rectal  deferred Extremities  No bilateral cyanosis, clubbing or edema.   Donaciano Eva, MD  07/20/2016, 1:15 PM

## 2016-07-20 ENCOUNTER — Ambulatory Visit: Payer: Medicare Other | Attending: Gynecologic Oncology | Admitting: Gynecologic Oncology

## 2016-07-20 ENCOUNTER — Other Ambulatory Visit (HOSPITAL_BASED_OUTPATIENT_CLINIC_OR_DEPARTMENT_OTHER): Payer: Medicare Other

## 2016-07-20 VITALS — BP 133/81 | HR 79 | Temp 98.2°F | Resp 18 | Ht 59.0 in | Wt 147.9 lb

## 2016-07-20 DIAGNOSIS — Z90722 Acquired absence of ovaries, bilateral: Secondary | ICD-10-CM | POA: Diagnosis not present

## 2016-07-20 DIAGNOSIS — L680 Hirsutism: Secondary | ICD-10-CM

## 2016-07-20 DIAGNOSIS — I10 Essential (primary) hypertension: Secondary | ICD-10-CM | POA: Diagnosis not present

## 2016-07-20 DIAGNOSIS — M109 Gout, unspecified: Secondary | ICD-10-CM | POA: Insufficient documentation

## 2016-07-20 DIAGNOSIS — Z85528 Personal history of other malignant neoplasm of kidney: Secondary | ICD-10-CM | POA: Insufficient documentation

## 2016-07-20 DIAGNOSIS — Z90710 Acquired absence of both cervix and uterus: Secondary | ICD-10-CM | POA: Insufficient documentation

## 2016-07-20 DIAGNOSIS — Z800 Family history of malignant neoplasm of digestive organs: Secondary | ICD-10-CM | POA: Insufficient documentation

## 2016-07-20 DIAGNOSIS — Z905 Acquired absence of kidney: Secondary | ICD-10-CM | POA: Diagnosis not present

## 2016-07-20 DIAGNOSIS — N858 Other specified noninflammatory disorders of uterus: Secondary | ICD-10-CM

## 2016-07-20 DIAGNOSIS — N859 Noninflammatory disorder of uterus, unspecified: Secondary | ICD-10-CM

## 2016-07-20 DIAGNOSIS — E785 Hyperlipidemia, unspecified: Secondary | ICD-10-CM | POA: Diagnosis not present

## 2016-07-20 DIAGNOSIS — Z8041 Family history of malignant neoplasm of ovary: Secondary | ICD-10-CM | POA: Insufficient documentation

## 2016-07-20 DIAGNOSIS — Z9889 Other specified postprocedural states: Secondary | ICD-10-CM | POA: Diagnosis not present

## 2016-07-20 DIAGNOSIS — Z8249 Family history of ischemic heart disease and other diseases of the circulatory system: Secondary | ICD-10-CM | POA: Diagnosis not present

## 2016-07-20 NOTE — Patient Instructions (Signed)
Plan to follow up with your other physicians.  Please call for any questions or concerns.

## 2016-07-21 ENCOUNTER — Ambulatory Visit (HOSPITAL_BASED_OUTPATIENT_CLINIC_OR_DEPARTMENT_OTHER): Payer: Medicare Other | Admitting: General Surgery

## 2016-07-21 DIAGNOSIS — D181 Lymphangioma, any site: Secondary | ICD-10-CM | POA: Diagnosis not present

## 2016-07-21 DIAGNOSIS — L680 Hirsutism: Secondary | ICD-10-CM | POA: Diagnosis not present

## 2016-07-21 LAB — TESTOSTERONE, FREE, TOTAL, SHBG
Sex Horm Binding Glob, Serum: 44.4 nmol/L (ref 17.3–125.0)
TESTOSTERONE FREE: 0.8 pg/mL (ref 0.0–4.2)
TESTOSTERONE: 5 ng/dL (ref 3–41)

## 2016-07-21 NOTE — Progress Notes (Signed)
At the pleasure of meeting with Bonnie Torres in follow-up status post combined resection of intra-abdominal mass, partial right nephrectomy and total abdominal hysterectomy with bilateral salpingo-oophorectomy. Since her last visit she believes she may be having a slight decrease in her hirsutism. A testosterone level was drawn yesterday but the results are pending at the present time.  Her final pathology was signed out and was reviewed with Dr. Carson Myrtle of pathology. I reviewed that the findings were that of a benign lymphangioma and a copy of her pathology report was provided to her. We had an extended discussion about what it lymphangioma was and that she does not need specific follow-up for that resected mass. She will be following up with Dr. Alinda Money regarding long-term surveillance of the malignancy of her right kidney.  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 Sections show adipocytes admixed with spindled and epithelioid cells in a homogenous, myxoid matrix. The individual adipocytes do not exhibit prominent lipoblast formation, but the background changes are reminiscent of those seen in a spindle cell lipoma. Foci of edema are present within this lesion which is likely a reactive change due to chronic lymphedema. I cannot absolutely exclude a well differentiated liposarcoma/atypical lipomatous tumor, but a spindle cell lipoma would be in the differential. Dr. Lyndon Code has similar opinion. For this reason, this lesion was be sent to Dr. Jeani Hawking at [facility] clinic for his expert opinion. The report is now finalized (see below). (NDS:kh 06/24/16) This lesion was sent to Dr. Jeani Hawking at [facility] for his expert opinion. He interprets this as an  intraabdominal mass as a benign adipocytic proliferation with myxoid change and an increased number of abnormally configured lymphatic vessels, most likely representing a lymphangioma with a prominent adipocytic component. Fluorescence in situ hybridization for MDM2 amplification performed at [facility] is negative. The cytogenetic FISH is usually positive in atypical lipomatous neoplasms/liposarcoma and for this reason, this is interpreted as a benign lymphangiomatous proliferation with entrapment of fat. His official opinion is as follows: "I reviewed this as a presenting intraabdominal mass near the cisterna Chyle from the above patient and can only approach this unusual, benign appearing fat rich proliferation in a descriptive fashion. As you have indicated there are some areas within the lesion which do vaguely resemble spindle cell lipoma, and as I have seen spindle cell lipoma-like areas in well differentiated liposarcoma, I thought it reasonable to perform fluorescence in situ hybridization for MDM-2 amplification. That study was negative. In light of this negative molecular result and given the location of the lesion, I was struck by the rather large number of abnormally configured lymphatic-type 1 of 2 Amended copy Amended FINAL for Bonnie Torres, Bonnie Torres [phone]) Microscopic Comment(continued) vessels present in this lesion, and areas associated with clusters of small lymphocytes. These changes are quite similar to the areas that may be seen in lymphangioma having a very prominent adipocytic component. As you know mature fat is frequently seen in association with a variety of benign vascular tumors and malformations. Although it is difficult to be certain, I suspect that is what we are dealing with." Thus, the sarcoma table is not performed as this is interpreted as a benign lesion. This final report is noted. (NDS:kh 07-08-16) Duard Brady MD Dermatopathologist,  Electronic  Assessment and plan: If her testosterone level is still elevated  we will plan to refer her to an endocrinologist. If however it is significantly decreased we will plan to see her back in 1 month with repeat testosterone to ensure it is returning to normal levels. She understands and agrees with this plan. We will contact her based on the upcoming testosterone report.

## 2016-07-21 NOTE — Patient Instructions (Signed)
We will contact you with the results of your testosterone level.  If it is still elevated or has increased, we will refer you to endocrinology.  If it has went down, we will see you in one month for follow up.  Please call for any questions or concerns.

## 2016-07-22 ENCOUNTER — Telehealth: Payer: Self-pay | Admitting: Gynecologic Oncology

## 2016-07-22 ENCOUNTER — Telehealth: Payer: Self-pay

## 2016-07-22 NOTE — Telephone Encounter (Signed)
Unable to leave a message with testosterone level.  We are also going to cancel Dr. Suzzanne Cloud appointment since it is no longer needed.  Will retry at a later time.

## 2016-07-22 NOTE — Telephone Encounter (Signed)
Pt informed of normal testosterone result and that she didn't need to follow up with Dr Rolanda Jay. Pt verbalized understanding.

## 2016-07-23 ENCOUNTER — Encounter: Payer: Self-pay | Admitting: General Surgery

## 2016-07-23 ENCOUNTER — Encounter: Payer: Self-pay | Admitting: Gynecologic Oncology

## 2016-07-28 DIAGNOSIS — C641 Malignant neoplasm of right kidney, except renal pelvis: Secondary | ICD-10-CM | POA: Diagnosis not present

## 2016-07-28 DIAGNOSIS — R8271 Bacteriuria: Secondary | ICD-10-CM | POA: Diagnosis not present

## 2016-08-18 ENCOUNTER — Encounter: Payer: Self-pay | Admitting: Physician Assistant

## 2016-08-18 ENCOUNTER — Other Ambulatory Visit: Payer: Self-pay | Admitting: Physician Assistant

## 2016-08-18 ENCOUNTER — Ambulatory Visit (INDEPENDENT_AMBULATORY_CARE_PROVIDER_SITE_OTHER): Payer: Medicare Other | Admitting: Physician Assistant

## 2016-08-18 VITALS — BP 155/79 | HR 78 | Ht 59.0 in | Wt 147.0 lb

## 2016-08-18 DIAGNOSIS — D181 Lymphangioma, any site: Secondary | ICD-10-CM | POA: Diagnosis not present

## 2016-08-18 DIAGNOSIS — I10 Essential (primary) hypertension: Secondary | ICD-10-CM

## 2016-08-18 DIAGNOSIS — E8941 Symptomatic postprocedural ovarian failure: Secondary | ICD-10-CM | POA: Diagnosis not present

## 2016-08-18 DIAGNOSIS — C649 Malignant neoplasm of unspecified kidney, except renal pelvis: Secondary | ICD-10-CM | POA: Insufficient documentation

## 2016-08-18 DIAGNOSIS — C641 Malignant neoplasm of right kidney, except renal pelvis: Secondary | ICD-10-CM

## 2016-08-18 DIAGNOSIS — N859 Noninflammatory disorder of uterus, unspecified: Secondary | ICD-10-CM

## 2016-08-18 DIAGNOSIS — F5101 Primary insomnia: Secondary | ICD-10-CM

## 2016-08-18 DIAGNOSIS — N858 Other specified noninflammatory disorders of uterus: Secondary | ICD-10-CM

## 2016-08-18 MED ORDER — FLUOXETINE HCL 20 MG PO TABS: 20.0000 mg | ORAL_TABLET | Freq: Every day | ORAL | 1 refills | Status: DC

## 2016-08-18 MED ORDER — METOPROLOL SUCCINATE ER 100 MG PO TB24: 100.0000 mg | ORAL_TABLET | Freq: Every day | ORAL | 1 refills | Status: DC

## 2016-08-18 MED ORDER — OLMESARTAN-AMLODIPINE-HCTZ 20-5-12.5 MG PO TABS: 1.0000 | ORAL_TABLET | Freq: Every day | ORAL | 1 refills | Status: DC

## 2016-08-18 MED ORDER — TRAZODONE HCL 50 MG PO TABS: 50.0000 mg | ORAL_TABLET | Freq: Every day | ORAL | 1 refills | Status: DC

## 2016-08-18 NOTE — Patient Instructions (Signed)
Menopause and Herbal Products Introduction WHAT IS MENOPAUSE? Menopause is the normal time of life when menstrual periods decrease in frequency and eventually stop completely. This process can take several years for some women. Menopause is complete when you have had an absence of menstruation for a full year since your last menstrual period. It usually occurs between the ages of 76 and 17. It is not common for menopause to begin before the age of 45. During menopause, your body stops producing the female hormones estrogen and progesterone. Common symptoms associated with this loss of hormones (vasomotor symptoms) are:  Hot flashes.  Hot flushes.  Night sweats. Other common symptoms and complications of menopause include:  Decrease in sex drive.  Vaginal dryness and thinning of the walls of the vagina. This can make sex painful.  Dryness of the skin and development of wrinkles.  Headaches.  Tiredness.  Irritability.  Memory problems.  Weight gain.  Bladder infections.  Hair growth on the face and chest.  Inability to reproduce offspring (infertility).  Loss of density in the bones (osteoporosis) increasing your risk for breaks (fractures).  Depression.  Hardening and narrowing of the arteries (atherosclerosis). This increases your risk of heart attack and stroke. WHAT TREATMENT OPTIONS ARE AVAILABLE? There are many treatment choices for menopause symptoms. The most common treatment is hormone replacement therapy. Many alternative therapies for menopause are emerging, including the use of herbal products. These supplements can be found in the form of herbs, teas, oils, tinctures, and pills. Common herbal supplements for menopause are made from plants that contain phytoestrogens. Phytoestrogens are compounds that occur naturally in plants and plant products. They act like estrogen in the body. Foods and herbs that contain phytoestrogens include:  Soy.  Flax seeds.  Red  clover.  Ginseng. WHAT MENOPAUSE SYMPTOMS MAY BE HELPED IF I USE HERBAL PRODUCTS?  Vasomotor symptoms. These may be helped by:  Soy. Some studies show that soy may have a moderate benefit for hot flashes.  Black cohosh. There is limited evidence indicating this may be beneficial for hot flashes.  Symptoms that are related to heart and blood vessel disease. These may be helped by soy. Studies have shown that soy can help to lower cholesterol.  Depression. This may be helped by:  Black cohosh. There is evidence that this may help depression and mood swings.  Osteoporosis. Soy may help to decrease bone loss that is associated with menopause and may prevent osteoporosis. Limited evidence indicates that red clover may offer some bone loss protection as well. Other herbal products that are commonly used during menopause lack enough evidence to support their use as a replacement for conventional menopause therapies. These products include evening primrose, ginseng, and red clover. WHAT ARE THE CASES WHEN HERBAL PRODUCTS SHOULD NOT BE USED DURING MENOPAUSE? Do not use herbal products during menopause without your health care provider's approval if:  You are taking medicine.  You have a preexisting liver condition. ARE THERE ANY RISKS IN MY TAKING HERBAL PRODUCTS DURING MENOPAUSE? If you choose to use herbal products to help with symptoms of menopause, keep in mind that:  Different supplements have different and unmeasured amounts of herbal ingredients.  Herbal products are not regulated the same way that medicines are.  Concentrations of herbs may vary depending on the way they are prepared. For example, the concentration may be different in a pill, tea, oil, and tincture.  Little is known about the risks of using herbal products, particularly the risks of long-term  use.  Some herbal supplements can be harmful when combined with certain medicines. Most commonly reported side effects of  herbal products are mild. However, if used improperly, many herbal supplements can cause serious problems. Talk to your health care provider before starting any herbal product. If problems develop, stop taking the supplement and let your health care provider know. This information is not intended to replace advice given to you by your health care provider. Make sure you discuss any questions you have with your health care provider. Document Released: 12/02/2007 Document Revised: 11/21/2015 Document Reviewed: 11/28/2013  2017 Elsevier

## 2016-08-18 NOTE — Progress Notes (Signed)
   Subjective:    Patient ID: Bonnie Torres, female    DOB: [birth year], 61 y.o.   MRN: [id number]  HPI  Pt is a 61 yo female who presents to the clinic for follow up.   HTN- she is taking her blood pressure medication. Upon question appears she was taking only once the metoprolol when rx for bid. She denies any CP, palpitations, headaches. She feels like her BP has been more elevated recently.   Pt has undergone partical kidney resection due to renal carcinoma and total hysterectomy due to uterine mass and abdominal surgery to remove lymphangioma.   She has noticed her hot flashes are much worse. Seem to be worse at night. It has started to effect her sleep. She has tried melatonin and seems to help some.     Review of Systems  All other systems reviewed and are negative.      Objective:   Physical Exam  Constitutional: She is oriented to person, place, and time. She appears well-developed and well-nourished.  HENT:  Head: Normocephalic and atraumatic.  Neck: Normal range of motion. Neck supple. No thyromegaly present.  Cardiovascular: Normal rate, regular rhythm and normal heart sounds.   Pulmonary/Chest: Effort normal and breath sounds normal.  Neurological: She is alert and oriented to person, place, and time.  Psychiatric: She has a normal mood and affect. Her behavior is normal.          Assessment & Plan:  Marland KitchenMarland KitchenDiagnoses and all orders for this visit:  Essential hypertension, benign -     metoprolol succinate (TOPROL-XL) 100 MG 24 hr tablet; Take 1 tablet (100 mg total) by mouth daily. Take with or immediately following a meal. -     Olmesartan-Amlodipine-HCTZ 20-5-12.5 MG TABS; Take 1 tablet by mouth daily.  Renal cell carcinoma of right kidney (HCC)  Uterine mass  Lymphangioma  Hot flashes due to surgical menopause -     FLUoxetine (PROZAC) 20 MG tablet; Take 1 tablet (20 mg total) by mouth daily.  Primary insomnia -     traZODone (DESYREL) 50 MG tablet; Take  1 tablet (50 mg total) by mouth at bedtime.   Added metoprolol XR so that she can take once a day. Continue on benicar. Recheck in1 month.   Discussed HRT due to side effects I would not suggest this treatment. Lets try prozac and trazodone to see if helping her sleep and helping hot flashes could help.   Ho given for natural remedies for hot flashes. Follow up in 1 month.

## 2016-08-19 DIAGNOSIS — E8941 Symptomatic postprocedural ovarian failure: Secondary | ICD-10-CM | POA: Insufficient documentation

## 2016-08-24 ENCOUNTER — Telehealth: Payer: Self-pay | Admitting: *Deleted

## 2016-08-24 NOTE — Telephone Encounter (Signed)
Pre Authorization sent to cover my meds. [id number]

## 2016-08-25 ENCOUNTER — Ambulatory Visit: Payer: Medicare Other | Admitting: General Surgery

## 2016-08-26 NOTE — Telephone Encounter (Signed)
Medication has been denied Deniedon February 26  Request Reference Number: [id number]. FLUOXETINE TAB 20MG is denied for not meeting the prior authorization requirement(s). For further questions, call [phone]. Appeals are not supported through [place]. Please refer to the written case notice for appeals information and instructions.

## 2016-09-02 NOTE — Telephone Encounter (Signed)
Mailbox is full.

## 2016-09-02 NOTE — Telephone Encounter (Signed)
Prozac is 4 dollar with good rx coupon not going through insurance. Go online and find pharmacy with cheaper price take card or print coupon and take with her.

## 2016-09-02 NOTE — Telephone Encounter (Signed)
Denied for use of hot flashes

## 2016-09-09 NOTE — Telephone Encounter (Signed)
Mailing letter

## 2016-09-09 NOTE — Telephone Encounter (Signed)
Mailbox full

## 2016-09-15 ENCOUNTER — Encounter: Payer: Self-pay | Admitting: Physician Assistant

## 2016-09-15 ENCOUNTER — Ambulatory Visit (INDEPENDENT_AMBULATORY_CARE_PROVIDER_SITE_OTHER): Payer: Medicare Other | Admitting: Physician Assistant

## 2016-09-15 VITALS — BP 142/82 | HR 61 | Ht 59.0 in | Wt 154.0 lb

## 2016-09-15 DIAGNOSIS — E669 Obesity, unspecified: Secondary | ICD-10-CM | POA: Diagnosis not present

## 2016-09-15 DIAGNOSIS — E8941 Symptomatic postprocedural ovarian failure: Secondary | ICD-10-CM

## 2016-09-15 DIAGNOSIS — I10 Essential (primary) hypertension: Secondary | ICD-10-CM | POA: Diagnosis not present

## 2016-09-15 DIAGNOSIS — F5101 Primary insomnia: Secondary | ICD-10-CM

## 2016-09-15 MED ORDER — TRAZODONE HCL 50 MG PO TABS: ORAL_TABLET | ORAL | 3 refills | Status: DC

## 2016-09-15 NOTE — Patient Instructions (Signed)
Menopause and Herbal Products What is menopause? Menopause is the normal time of life when menstrual periods decrease in frequency and eventually stop completely. This process can take several years for some women. Menopause is complete when you have had an absence of menstruation for a full year since your last menstrual period. It usually occurs between the ages of 48 and 55. It is not common for menopause to begin before the age of 40. During menopause, your body stops producing the female hormones estrogen and progesterone. Common symptoms associated with this loss of hormones (vasomotor symptoms) are:  Hot flashes.  Hot flushes.  Night sweats.  Other common symptoms and complications of menopause include:  Decrease in sex drive.  Vaginal dryness and thinning of the walls of the vagina. This can make sex painful.  Dryness of the skin and development of wrinkles.  Headaches.  Tiredness.  Irritability.  Memory problems.  Weight gain.  Bladder infections.  Hair growth on the face and chest.  Inability to reproduce offspring (infertility).  Loss of density in the bones (osteoporosis) increasing your risk for breaks (fractures).  Depression.  Hardening and narrowing of the arteries (atherosclerosis). This increases your risk of heart attack and stroke.  What treatment options are available? There are many treatment choices for menopause symptoms. The most common treatment is hormone replacement therapy. Many alternative therapies for menopause are emerging, including the use of herbal products. These supplements can be found in the form of herbs, teas, oils, tinctures, and pills. Common herbal supplements for menopause are made from plants that contain phytoestrogens. Phytoestrogens are compounds that occur naturally in plants and plant products. They act like estrogen in the body. Foods and herbs that contain phytoestrogens include:  Soy.  Flax seeds.  Red  clover.  Ginseng.  What menopause symptoms may be helped if I use herbal products?  Vasomotor symptoms. These may be helped by: ? Soy. Some studies show that soy may have a moderate benefit for hot flashes. ? Black cohosh. There is limited evidence indicating this may be beneficial for hot flashes.  Symptoms that are related to heart and blood vessel disease. These may be helped by soy. Studies have shown that soy can help to lower cholesterol.  Depression. This may be helped by: ? St. John's wort. There is limited evidence that shows this may help mild to moderate depression. ? Black cohosh. There is evidence that this may help depression and mood swings.  Osteoporosis. Soy may help to decrease bone loss that is associated with menopause and may prevent osteoporosis. Limited evidence indicates that red clover may offer some bone loss protection as well. Other herbal products that are commonly used during menopause lack enough evidence to support their use as a replacement for conventional menopause therapies. These products include evening primrose, ginseng, and red clover. What are the cases when herbal products should not be used during menopause? Do not use herbal products during menopause without your health care provider's approval if:  You are taking medicine.  You have a preexisting liver condition.  Are there any risks in my taking herbal products during menopause? If you choose to use herbal products to help with symptoms of menopause, keep in mind that:  Different supplements have different and unmeasured amounts of herbal ingredients.  Herbal products are not regulated the same way that medicines are.  Concentrations of herbs may vary depending on the way they are prepared. For example, the concentration may be different in a pill,   Little is known about the risks of using herbal products, particularly the risks of long-term use.  Some herbal  supplements can be harmful when combined with certain medicines. Most commonly reported side effects of herbal products are mild. However, if used improperly, many herbal supplements can cause serious problems. Talk to your health care provider before starting any herbal product. If problems develop, stop taking the supplement and let your health care provider know. This information is not intended to replace advice given to you by your health care provider. Make sure you discuss any questions you have with your health care provider. Document Released: 12/02/2007 Document Revised: 05/12/2016 Document Reviewed: 11/28/2013 Elsevier Interactive Patient Education  2017 [employer]. Menopause Menopause is the normal time of life when menstrual periods stop completely. Menopause is complete when you have missed 12 consecutive menstrual periods. It usually occurs between the ages of 38 years and 71 years. Very rarely does a woman develop menopause before the age of 69 years. At menopause, your ovaries stop producing the female hormones estrogen and progesterone. This can cause undesirable symptoms and also affect your health. Sometimes the symptoms may occur 4-5 years before the menopause begins. There is no relationship between menopause and:  Oral contraceptives.  Number of children you had.  Race.  The age your menstrual periods started (menarche). Heavy smokers and very thin women may develop menopause earlier in life. What are the causes?  The ovaries stop producing the female hormones estrogen and progesterone. Other causes include:  Surgery to remove both ovaries.  The ovaries stop functioning for no known reason.  Tumors of the pituitary gland in the brain.  Medical disease that affects the ovaries and hormone production.  Radiation treatment to the abdomen or pelvis.  Chemotherapy that affects the ovaries. What are the signs or symptoms?  Hot flashes.  Night  sweats.  Decrease in sex drive.  Vaginal dryness and thinning of the vagina causing painful intercourse.  Dryness of the skin and developing wrinkles.  Headaches.  Tiredness.  Irritability.  Memory problems.  Weight gain.  Bladder infections.  Hair growth of the face and chest.  Infertility. More serious symptoms include:  Loss of bone (osteoporosis) causing breaks (fractures).  Depression.  Hardening and narrowing of the arteries (atherosclerosis) causing heart attacks and strokes. How is this diagnosed?  When the menstrual periods have stopped for 12 straight months.  Physical exam.  Hormone studies of the blood. How is this treated? There are many treatment choices and nearly as many questions about them. The decisions to treat or not to treat menopausal changes is an individual choice made with your health care provider. Your health care provider can discuss the treatments with you. Together, you can decide which treatment will work best for you. Your treatment choices may include:  Hormone therapy (estrogen and progesterone).  Non-hormonal medicines.  Treating the individual symptoms with medicine (for example antidepressants for depression).  Herbal medicines that may help specific symptoms.  Counseling by a psychiatrist or psychologist.  Group therapy.  Lifestyle changes including:  Eating healthy.  Regular exercise.  Limiting caffeine and alcohol.  Stress management and meditation.  No treatment. Follow these instructions at home:  Take the medicine your health care provider gives you as directed.  Get plenty of sleep and rest.  Exercise regularly.  Eat a diet that contains calcium (good for the bones) and soy products (acts like estrogen hormone).  Avoid alcoholic beverages.  Do not smoke.  If you  have hot flashes, dress in layers.  Take supplements, calcium, and vitamin D to strengthen bones.  You can use over-the-counter  lubricants or moisturizers for vaginal dryness.  Group therapy is sometimes very helpful.  Acupuncture may be helpful in some cases. Contact a health care provider if:  You are not sure you are in menopause.  You are having menopausal symptoms and need advice and treatment.  You are still having menstrual periods after age 2 years.  You have pain with intercourse.  Menopause is complete (no menstrual period for 12 months) and you develop vaginal bleeding.  You need a referral to a specialist (gynecologist, psychiatrist, or psychologist) for treatment. Get help right away if:  You have severe depression.  You have excessive vaginal bleeding.  You fell and think you have a broken bone.  You have pain when you urinate.  You develop leg or chest pain.  You have a fast pounding heart beat (palpitations).  You have severe headaches.  You develop vision problems.  You feel a lump in your breast.  You have abdominal pain or severe indigestion. This information is not intended to replace advice given to you by your health care provider. Make sure you discuss any questions you have with your health care provider. Document Released: 09/05/2003 Document Revised: 11/21/2015 Document Reviewed: 01/12/2013 Elsevier Interactive Patient Education  2017 [employer].

## 2016-09-16 ENCOUNTER — Encounter: Payer: Self-pay | Admitting: *Deleted

## 2016-09-16 DIAGNOSIS — E669 Obesity, unspecified: Secondary | ICD-10-CM | POA: Insufficient documentation

## 2016-09-16 DIAGNOSIS — E66811 Obesity, class 1: Secondary | ICD-10-CM | POA: Insufficient documentation

## 2016-09-16 NOTE — Progress Notes (Signed)
   Subjective:    Patient ID: Bonnie Torres, female    DOB: [birth year], 61 y.o.   MRN: [id number]  HPI Pt is a 61 yo female who presents to the clinic for follow up.   HTN- BP continues to be slightly elevated. She denies any headaches, vision changes, dizziness, palpitations, CP's. She is taking Tribenzor and metoprolol daily.   She is sleeping better with trazodone. She continues to have some nights where she feels like it is not working well. She denies any morning grogginess. She would like refill.   She continues to have hot flashes she never started prozac because she read it was for depression.    Review of Systems  All other systems reviewed and are negative.      Objective:   Physical Exam  Constitutional: She is oriented to person, place, and time. She appears well-developed and well-nourished.  HENT:  Head: Normocephalic and atraumatic.  Cardiovascular: Normal rate, regular rhythm and normal heart sounds.   Pulmonary/Chest: Effort normal and breath sounds normal.  Neurological: She is alert and oriented to person, place, and time.  Psychiatric: She has a normal mood and affect. Her behavior is normal.          Assessment & Plan:  Marland KitchenMarland KitchenDiagnoses and all orders for this visit:  Essential hypertension, benign  Primary insomnia -     traZODone (DESYREL) 50 MG tablet; Take 1-2 tablets at bedtime for sleep.  Obesity (BMI 30.0-34.9)  Hot flashes due to surgical menopause   Refilled trazodone. Increased to 100mg  if needed.   We are going to watch BP for next 2 months. Discussed with patient if she could lose 10 to 15lbs could also help to get BP down. She is going to start walking. Stay on same medications for now. If no improvement will have to increase medications.   Encouraged patient to start prozac if hot flashes were bothersome. I do not think pt is a candidate for HRT therefore we are going to have to find alternative medications to help with symptoms.  Printed list of herbal things to try as well.

## 2016-10-13 ENCOUNTER — Other Ambulatory Visit: Payer: Self-pay | Admitting: Physician Assistant

## 2016-10-13 DIAGNOSIS — I10 Essential (primary) hypertension: Secondary | ICD-10-CM

## 2016-10-15 ENCOUNTER — Other Ambulatory Visit: Payer: Self-pay | Admitting: *Deleted

## 2016-10-15 MED ORDER — PRAVASTATIN SODIUM 40 MG PO TABS
ORAL_TABLET | ORAL | 4 refills | Status: DC
Start: 2016-10-15 — End: 2017-03-31

## 2016-11-16 ENCOUNTER — Encounter: Payer: Self-pay | Admitting: Physician Assistant

## 2016-11-16 ENCOUNTER — Ambulatory Visit (INDEPENDENT_AMBULATORY_CARE_PROVIDER_SITE_OTHER): Payer: Medicare Other | Admitting: Physician Assistant

## 2016-11-16 VITALS — BP 134/84 | HR 80 | Ht 59.0 in | Wt 151.0 lb

## 2016-11-16 DIAGNOSIS — E8941 Symptomatic postprocedural ovarian failure: Secondary | ICD-10-CM

## 2016-11-16 DIAGNOSIS — M1000 Idiopathic gout, unspecified site: Secondary | ICD-10-CM

## 2016-11-16 DIAGNOSIS — I10 Essential (primary) hypertension: Secondary | ICD-10-CM

## 2016-11-16 DIAGNOSIS — F5101 Primary insomnia: Secondary | ICD-10-CM

## 2016-11-16 MED ORDER — FLUOXETINE HCL 20 MG PO TABS: 20.0000 mg | ORAL_TABLET | Freq: Every day | ORAL | 1 refills | Status: DC

## 2016-11-16 MED ORDER — METOPROLOL SUCCINATE ER 100 MG PO TB24: ORAL_TABLET | ORAL | 1 refills | Status: DC

## 2016-11-16 NOTE — Progress Notes (Signed)
   Subjective:    Patient ID: Bonnie Torres, female    DOB: 05-10-1956, 61 y.o.   MRN: [id number]  HPI  Pt is a 61 yo female who presents to the clinic to follow up on BP. She has lost another 3lbs. She denies any CP, palpitations, headaches, vision changes.   Insomnia has improved with trazodone. Doing well.   Her hot flashes are better with prozac and mood is great.   PMH of gout. Not taking preventative. Does not want to start if back. Not had a flare in a while.      Review of Systems  All other systems reviewed and are negative.      Objective:   Physical Exam  Constitutional: She is oriented to person, place, and time. She appears well-developed and well-nourished.  HENT:  Head: Normocephalic and atraumatic.  Cardiovascular: Normal rate, regular rhythm and normal heart sounds.   Pulmonary/Chest: Effort normal and breath sounds normal.  Neurological: She is alert and oriented to person, place, and time.  Psychiatric: She has a normal mood and affect. Her behavior is normal.          Assessment & Plan:  Marland KitchenMarland KitchenFaith was seen today for hypertension.  Diagnoses and all orders for this visit:  Essential hypertension, benign -     metoprolol succinate (TOPROL-XL) 100 MG 24 hr tablet; TAKE 1 TABLET BY MOUTH DAILY. TAKE WITH OR IMMEDIATELY FOLLOWING A MEAL -     COMPLETE METABOLIC PANEL WITH GFR  Hot flashes due to surgical menopause -     FLUoxetine (PROZAC) 20 MG tablet; Take 1 tablet (20 mg total) by mouth daily.  Idiopathic gout, unspecified chronicity, unspecified site -     Uric acid  Primary insomnia  .[name] Depression screen [facility] 2/9 11/16/2016 09/15/2016  Decreased Interest 0 0  Down, Depressed, Hopeless 0 0  PHQ - 2 Score 0 0  Altered sleeping 0 -  Tired, decreased energy 0 -  Change in appetite 0 -  Feeling bad or failure about yourself  0 -  Trouble concentrating 0 -  Moving slowly or fidgety/restless 0 -  Suicidal thoughts 0 -  PHQ-9 Score 0 -     Refilled BP medications for 6 months.  CMP ordered.  CPE in 6 months.  Refilled prozac for 6 months.

## 2017-01-07 ENCOUNTER — Other Ambulatory Visit: Payer: Self-pay | Admitting: Physician Assistant

## 2017-01-07 DIAGNOSIS — F5101 Primary insomnia: Secondary | ICD-10-CM

## 2017-01-08 ENCOUNTER — Other Ambulatory Visit: Payer: Self-pay | Admitting: Physician Assistant

## 2017-01-08 DIAGNOSIS — I10 Essential (primary) hypertension: Secondary | ICD-10-CM

## 2017-02-12 ENCOUNTER — Ambulatory Visit (HOSPITAL_COMMUNITY)
Admission: RE | Admit: 2017-02-12 | Discharge: 2017-02-12 | Disposition: A | Discharge status: Home / Self Care | Payer: Medicare Other | Source: Ambulatory Visit | Attending: Urology | Admitting: Urology

## 2017-02-12 ENCOUNTER — Other Ambulatory Visit (HOSPITAL_COMMUNITY): Payer: Self-pay | Admitting: Urology

## 2017-02-12 DIAGNOSIS — C641 Malignant neoplasm of right kidney, except renal pelvis: Secondary | ICD-10-CM | POA: Insufficient documentation

## 2017-02-12 DIAGNOSIS — I10 Essential (primary) hypertension: Secondary | ICD-10-CM | POA: Diagnosis not present

## 2017-02-12 IMAGING — DX DG CHEST 2V
2 series · 2 of 2 positions shown · non-contrast
Comparison: CT chest [DATE]

CLINICAL DATA: Hypertension.

EXAM:
CHEST  2 VIEW

[chest pa]
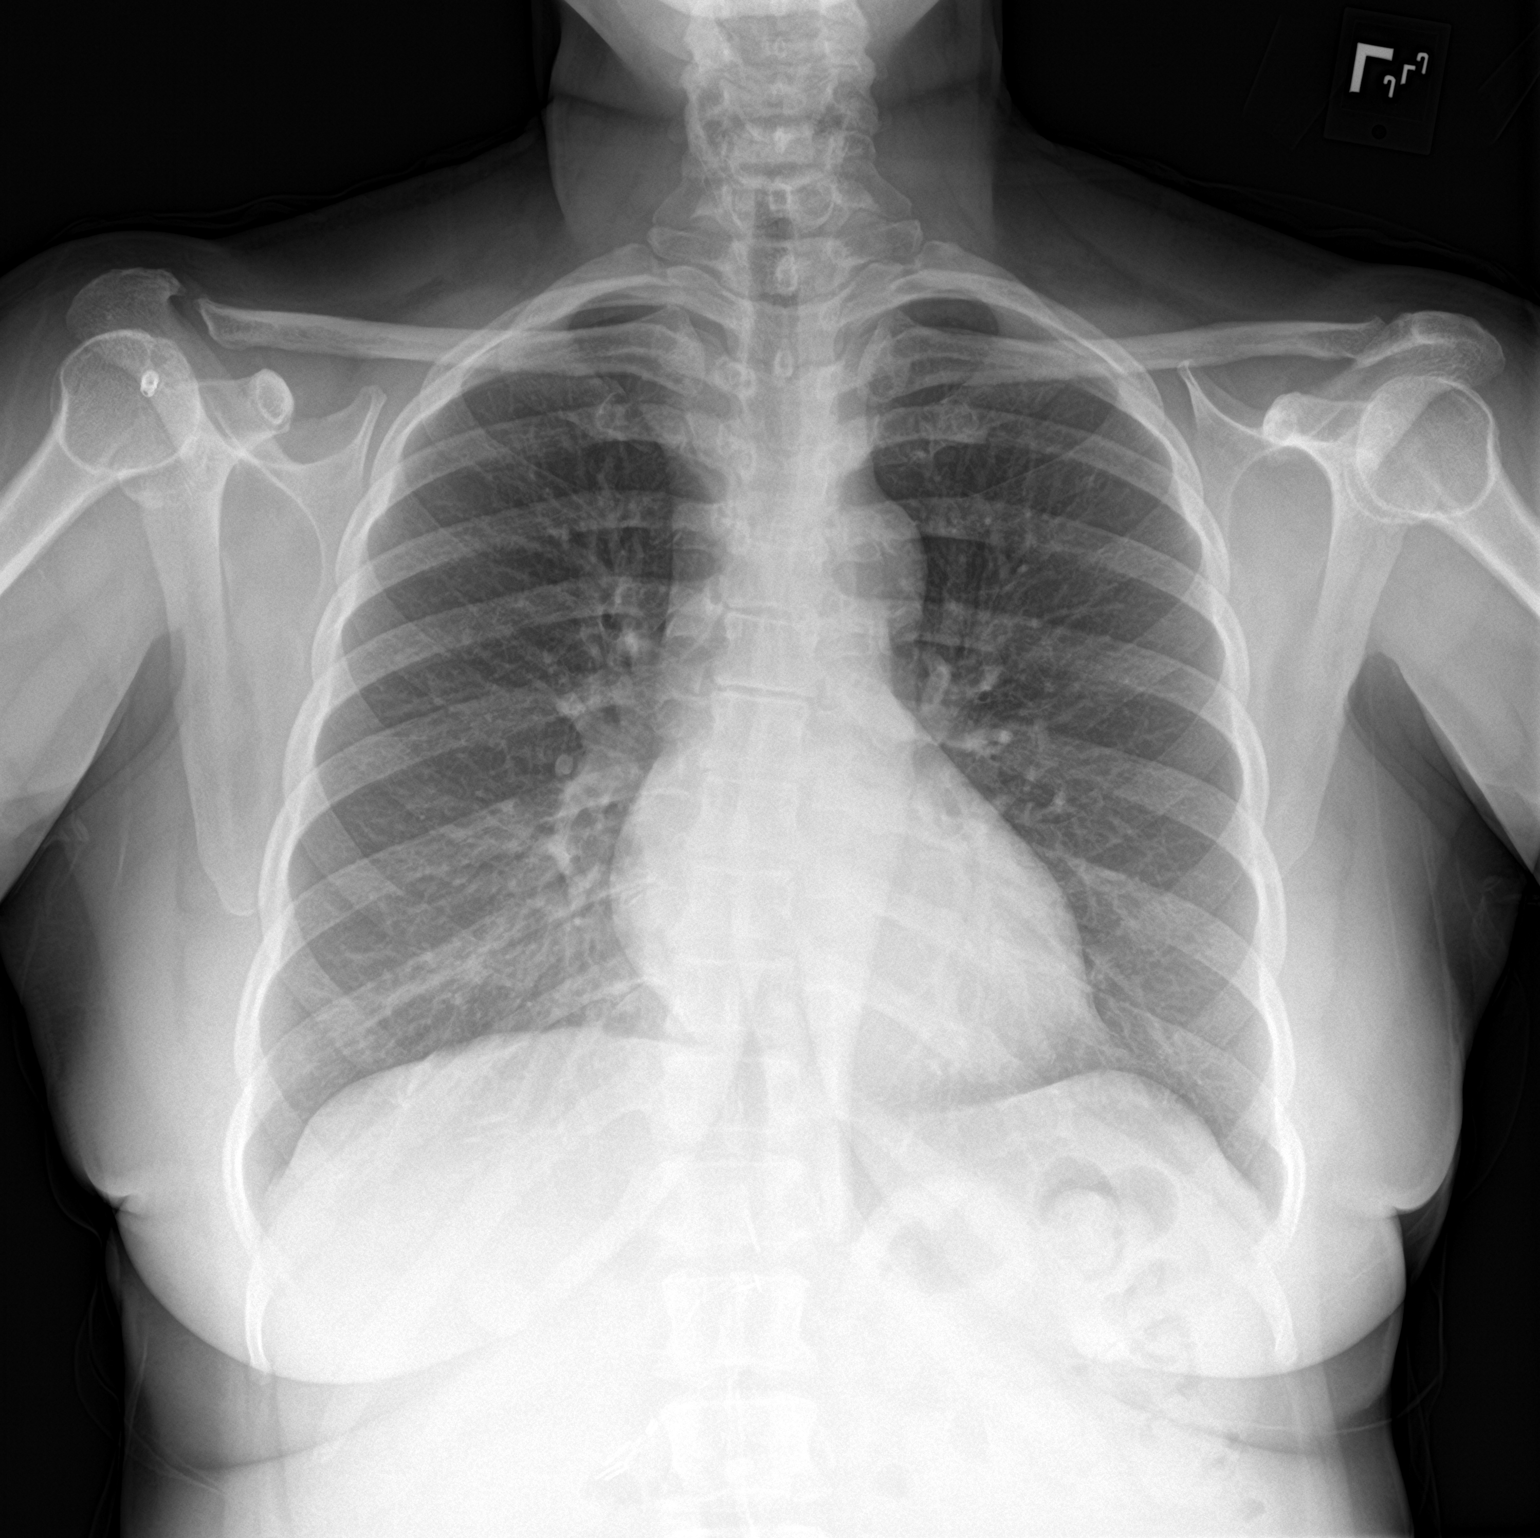

[chest lat]
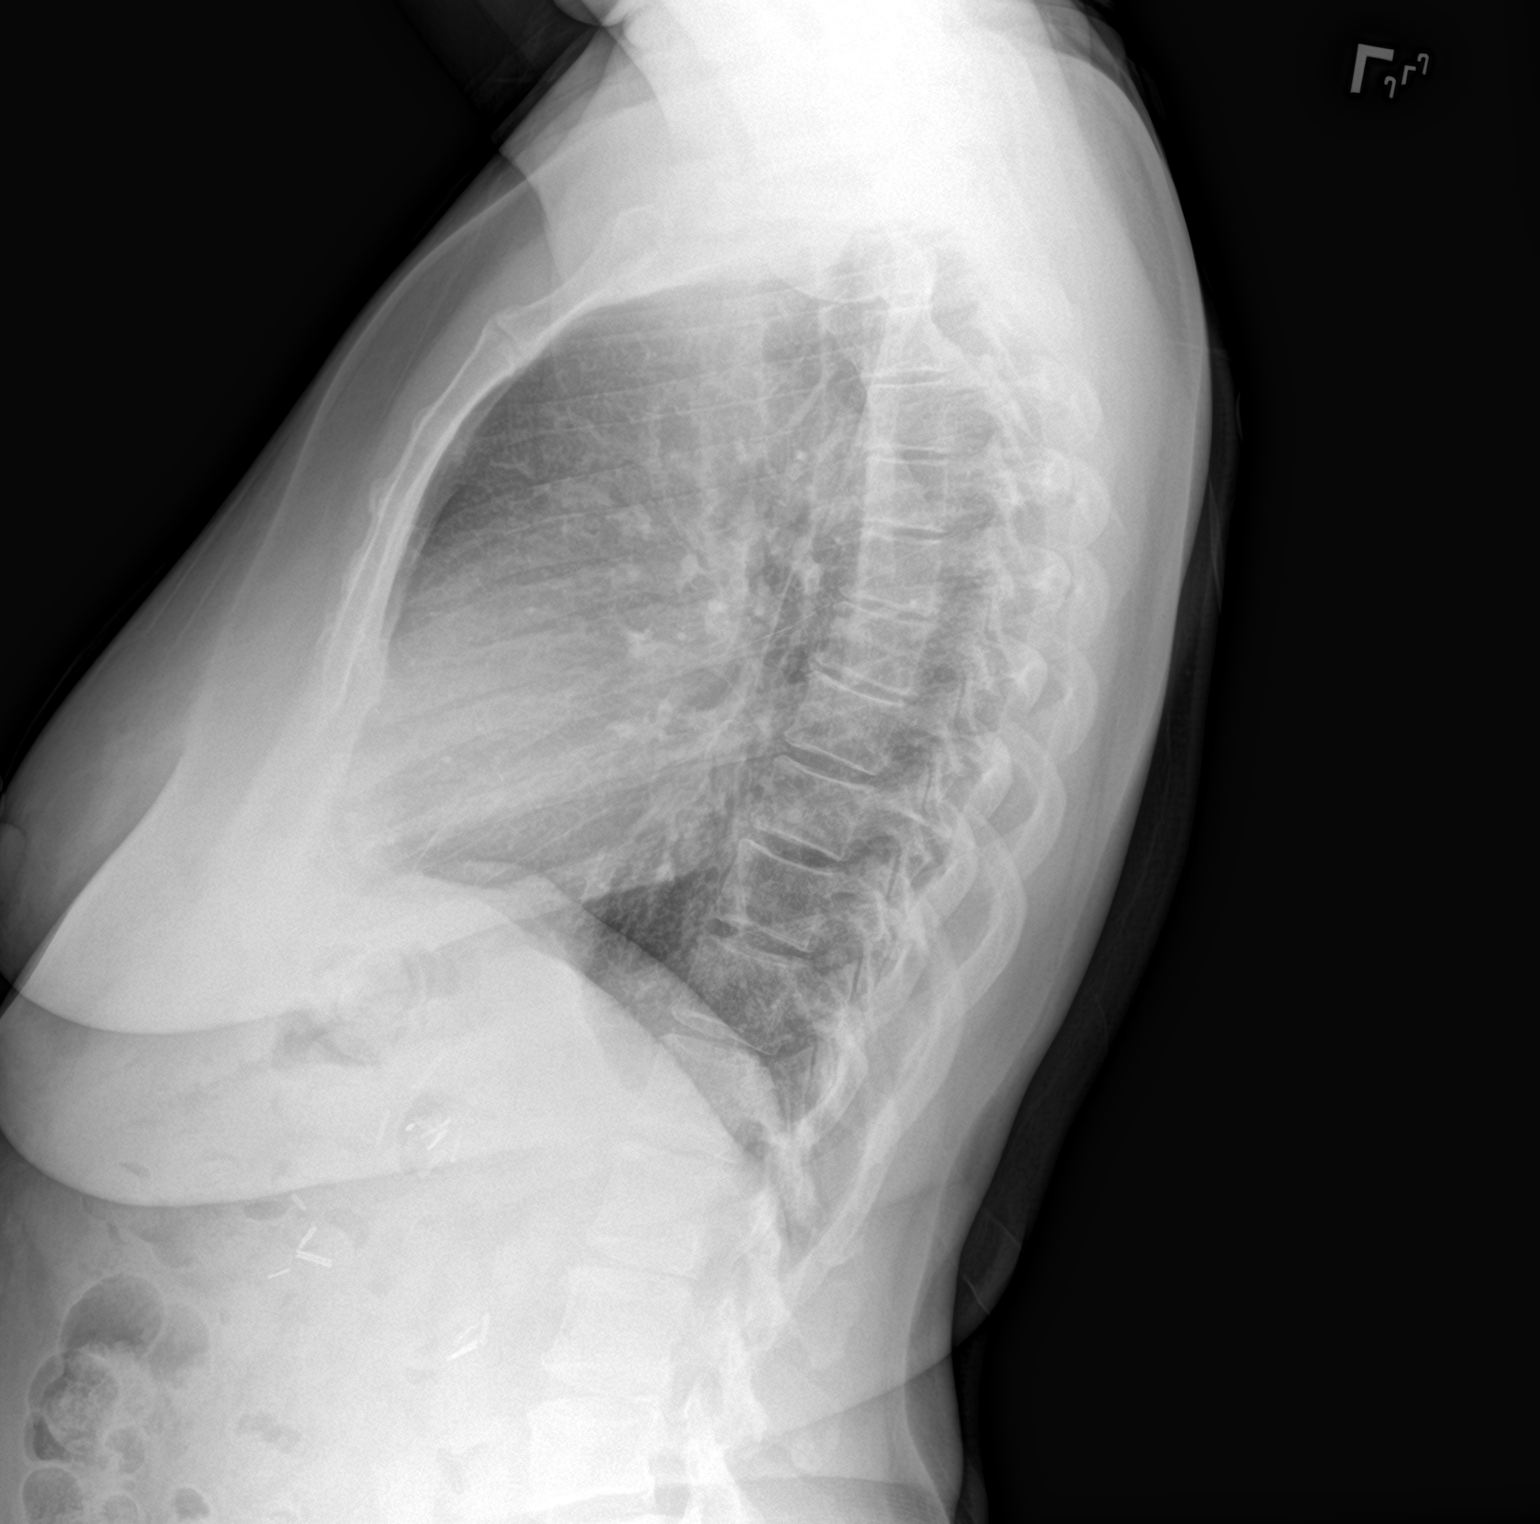

[2 of 2 positions shown; findings below may reference images not displayed]

FINDINGS: The lungs are clear without focal pneumonia, edema, pneumothorax or
pleural effusion. The cardiopericardial silhouette is within normal
limits for size. The visualized bony structures of the thorax are
intact.
IMPRESSION: No acute cardiopulmonary findings.

## 2017-02-19 DIAGNOSIS — C641 Malignant neoplasm of right kidney, except renal pelvis: Secondary | ICD-10-CM | POA: Diagnosis not present

## 2017-03-31 ENCOUNTER — Ambulatory Visit (INDEPENDENT_AMBULATORY_CARE_PROVIDER_SITE_OTHER): Payer: Medicare Other | Admitting: Physician Assistant

## 2017-03-31 ENCOUNTER — Encounter: Payer: Self-pay | Admitting: Physician Assistant

## 2017-03-31 VITALS — BP 135/82 | HR 77 | Ht 59.0 in | Wt 157.0 lb

## 2017-03-31 DIAGNOSIS — R1901 Right upper quadrant abdominal swelling, mass and lump: Secondary | ICD-10-CM | POA: Diagnosis not present

## 2017-03-31 DIAGNOSIS — Z23 Encounter for immunization: Secondary | ICD-10-CM | POA: Diagnosis not present

## 2017-03-31 DIAGNOSIS — E876 Hypokalemia: Secondary | ICD-10-CM | POA: Diagnosis not present

## 2017-03-31 DIAGNOSIS — E7800 Pure hypercholesterolemia, unspecified: Secondary | ICD-10-CM

## 2017-03-31 DIAGNOSIS — I10 Essential (primary) hypertension: Secondary | ICD-10-CM

## 2017-03-31 DIAGNOSIS — F5101 Primary insomnia: Secondary | ICD-10-CM

## 2017-03-31 DIAGNOSIS — Z0000 Encounter for general adult medical examination without abnormal findings: Secondary | ICD-10-CM | POA: Diagnosis not present

## 2017-03-31 DIAGNOSIS — Z79899 Other long term (current) drug therapy: Secondary | ICD-10-CM | POA: Diagnosis not present

## 2017-03-31 DIAGNOSIS — E8941 Symptomatic postprocedural ovarian failure: Secondary | ICD-10-CM

## 2017-03-31 DIAGNOSIS — Z1231 Encounter for screening mammogram for malignant neoplasm of breast: Secondary | ICD-10-CM

## 2017-03-31 MED ORDER — FLUOXETINE HCL 20 MG PO TABS: 20.0000 mg | ORAL_TABLET | Freq: Every day | ORAL | 1 refills | Status: DC

## 2017-03-31 MED ORDER — OLMESARTAN-AMLODIPINE-HCTZ 20-5-12.5 MG PO TABS: 1.0000 | ORAL_TABLET | Freq: Every day | ORAL | 1 refills | Status: DC

## 2017-03-31 MED ORDER — TRAZODONE HCL 50 MG PO TABS: ORAL_TABLET | ORAL | 1 refills | Status: DC

## 2017-03-31 MED ORDER — MELOXICAM 15 MG PO TABS: 15.0000 mg | ORAL_TABLET | Freq: Every day | ORAL | 1 refills | Status: DC

## 2017-03-31 MED ORDER — PRAVASTATIN SODIUM 40 MG PO TABS: ORAL_TABLET | ORAL | 4 refills | Status: DC

## 2017-03-31 MED ORDER — METOPROLOL SUCCINATE ER 100 MG PO TB24: ORAL_TABLET | ORAL | 1 refills | Status: DC

## 2017-03-31 MED ORDER — POTASSIUM CHLORIDE CRYS ER 20 MEQ PO TBCR: EXTENDED_RELEASE_TABLET | ORAL | 1 refills | Status: DC

## 2017-03-31 NOTE — Patient Instructions (Signed)
Will get mammogram.  Will send cologuard.

## 2017-03-31 NOTE — Progress Notes (Addendum)
Subjective:    Patient ID: Bonnie Torres, female    DOB: 07/27/1955, 61 y.o.   MRN: [id number]  HPI The patient is a 61 year old female who presents to the clinic today for a refill of her medications.last year she had uterine, ovarian, omentum, renal masses. She has had them removed and no cancer detected only a hormone secreting tumor. She is doing well and testosterone has returned to normal.  She states that she is doing well. Blood pressure is well controlled. Insomnia is well controlled. She states she has not had a gout attack in over a year. She reports that she is still having hot flashes, but she says she can deal with them. She states she is still taking her potassium chloride. She  Her only concern is that she still has a few "hair bumps" on her face. She is wondering if there is anything else that she can do for these.   .. Active Ambulatory Problems    Diagnosis Date Noted  . Insomnia 08/31/2014  . Essential hypertension, benign 08/31/2014  . Hyperlipidemia 09/28/2014  . Bilateral low back pain without sciatica 04/17/2015  . Primary gout 10/16/2015  . Breast calcification, right 11/13/2015  . Elevated testosterone level in female 03/25/2016  . Mass of kidney 04/05/2016  . Uterine mass 04/27/2016  . Lymphangioma 06/18/2016  . Hot flashes due to surgical menopause 08/19/2016  . Obesity (BMI 30.0-34.9) 09/16/2016  . Hypokalemia 04/02/2017   Resolved Ambulatory Problems    Diagnosis Date Noted  . Mass of omentum 04/05/2016  . Hirsutism 04/05/2016  . Mass of ovary 04/05/2016  . Renal cell carcinoma ([place]) 08/18/2016   Past Medical History:  Diagnosis Date  . Gout   . Heart rate fast   . Hyperlipidemia   . Hypertension   . 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      Objective:   Physical Exam  Constitutional: She is oriented to person, place, and time. She appears well-developed  and well-nourished.  HENT:  Head: Normocephalic and atraumatic.  Right Ear: External ear normal.  Left Ear: External ear normal.  Nose: Nose normal.  Mouth/Throat: Oropharynx is clear and moist.  Eyes: Pupils are equal, round, and reactive to light.  Cardiovascular: Normal rate, regular rhythm and normal heart sounds.   Pulmonary/Chest: Effort normal and breath sounds normal.  Neurological: She is alert and oriented to person, place, and time.  Psychiatric: She has a normal mood and affect. Her behavior is normal.          Assessment & Plan:  Marland KitchenMarland KitchenDiagnoses and all orders for this visit:  Routine physical examination -     Lipid Panel w/reflex Direct LDL -     COMPLETE METABOLIC PANEL WITH GFR -     CBC with Differential/Platelet  Influenza vaccine needed -     Flu Vaccine QUAD 6+ mos PF IM (Fluarix Quad PF)  Hypokalemia -     potassium chloride SA (K-DUR,KLOR-CON) 20 MEQ tablet; TAKE 1 TABLET(20 MEQ) BY MOUTH DAILY  Essential hypertension, benign -     metoprolol succinate (TOPROL-XL) 100 MG 24 hr tablet; TAKE 1 TABLET BY MOUTH DAILY. TAKE WITH OR IMMEDIATELY FOLLOWING A MEAL -     Olmesartan-Amlodipine-HCTZ 20-5-12.5 MG TABS; Take 1 tablet by mouth daily.  Hot flashes due to surgical menopause -     FLUoxetine (PROZAC) 20 MG tablet; Take 1 tablet (  20 mg total) by mouth daily.  Primary insomnia -     traZODone (DESYREL) 50 MG tablet; TAKE 1 TO 2 TABLETS BY MOUTH AT BEDTIME FOR SLEEP  Pure hypercholesterolemia -     Lipid Panel w/reflex Direct LDL  Medication management -     COMPLETE METABOLIC PANEL WITH GFR  Visit for screening mammogram -     MM SCREENING BREAST TOMO BILATERAL; Future  Other orders -     meloxicam (MOBIC) 15 MG tablet; Take 1 tablet (15 mg total) by mouth daily. As needed for back pain -     pravastatin (PRAVACHOL) 40 MG tablet; Take one tablet by mouth daily.  Follow up in 6 months for BP check.   .. Discussed 150 minutes of exercise a week.   Encouraged vitamin D 1000 units and Calcium 1300mg  or 4 servings of dairy a day.  Vaccines up to date.  cologuard sent.  Discussed lazer treatment/exfolation for hair bumps.

## 2017-04-02 DIAGNOSIS — E876 Hypokalemia: Secondary | ICD-10-CM | POA: Insufficient documentation

## 2017-04-02 NOTE — Addendum Note (Signed)
Addended by: Donella Stade on: 04/02/2017 10:02 PM   Modules accepted: Orders

## 2017-04-05 ENCOUNTER — Other Ambulatory Visit: Payer: Self-pay | Admitting: Physician Assistant

## 2017-04-05 DIAGNOSIS — F5101 Primary insomnia: Secondary | ICD-10-CM

## 2017-04-13 DIAGNOSIS — Z1212 Encounter for screening for malignant neoplasm of rectum: Secondary | ICD-10-CM | POA: Diagnosis not present

## 2017-04-13 DIAGNOSIS — Z1211 Encounter for screening for malignant neoplasm of colon: Secondary | ICD-10-CM | POA: Diagnosis not present

## 2017-04-13 LAB — COLOGUARD: Cologuard: NEGATIVE

## 2017-05-06 ENCOUNTER — Emergency Department
Admission: EM | Admit: 2017-05-06 | Discharge: 2017-05-06 | Disposition: A | Discharge status: Home / Self Care | Payer: Medicare Other | Source: Home / Self Care | Attending: Family Medicine | Admitting: Family Medicine

## 2017-05-06 ENCOUNTER — Other Ambulatory Visit: Payer: Self-pay

## 2017-05-06 DIAGNOSIS — R030 Elevated blood-pressure reading, without diagnosis of hypertension: Secondary | ICD-10-CM

## 2017-05-06 DIAGNOSIS — J029 Acute pharyngitis, unspecified: Secondary | ICD-10-CM | POA: Diagnosis not present

## 2017-05-06 DIAGNOSIS — M542 Cervicalgia: Secondary | ICD-10-CM

## 2017-05-06 LAB — POCT RAPID STREP A (OFFICE): Rapid Strep A Screen: NEGATIVE

## 2017-05-06 MED ORDER — AMOXICILLIN 500 MG PO CAPS: 500.0000 mg | ORAL_CAPSULE | Freq: Three times a day (TID) | ORAL | 0 refills | Status: DC

## 2017-05-06 MED ORDER — PREDNISONE 20 MG PO TABS: 40.0000 mg | ORAL_TABLET | Freq: Every day | ORAL | 0 refills | Status: DC

## 2017-05-06 NOTE — ED Triage Notes (Signed)
Started with a cough yesterday.  This morning around noon, left side more sore and swollen.

## 2017-05-06 NOTE — Discharge Instructions (Signed)
°  You may take 500mg acetaminophen every 4-6 hours or in combination with ibuprofen 400-600mg every 6-8 hours as needed for pain, inflammation, and fever. ° °Be sure to drink at least eight 8oz glasses of water to stay well hydrated and get at least 8 hours of sleep at night, preferably more while sick.  ° °Please take antibiotics as prescribed and be sure to complete entire course even if you start to feel better to ensure infection does not come back. ° °

## 2017-05-06 NOTE — ED Provider Notes (Signed)
[name]    CSN: [id number] Arrival date & time: 05/06/17  1413     History   Chief Complaint Chief Complaint  Patient presents with  . Sore Throat    HPI Bonnie Torres is a 61 y.o. female.   HPI Bonnie Torres is a 61 y.o. female presenting to UC with c/o mildly intermittent dry cough with sore throat that started yesterday.  Pain and swelling is worse on Left side of her throat.  She has not tried anything for her symptoms but states she has had a low-grade fever. Denies n/v/d. No known sick contacts.  She has not had anything to eat or drink today due to the pain.  She also reports forgetting to take her BP medication. Hx of HTN.  Denies HA, chest pain, dizziness or SOB.   Past Medical History:  Diagnosis Date  . Gout    1 yr ago right great toe  . Heart rate fast    tx Metoprolol  . Hyperlipidemia   . Hypertension   . 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 Hypokalemia 04/02/2017  . Obesity (BMI 30.0-34.9) 09/16/2016  . Hot flashes due to surgical menopause 08/19/2016  . Lymphangioma 06/18/2016  . Uterine mass 04/27/2016  . Mass of kidney 04/05/2016  . Elevated testosterone level in female 03/25/2016  . Breast calcification, right 11/13/2015  . Primary gout 10/16/2015  . Bilateral low back pain without sciatica 04/17/2015  . Hyperlipidemia 09/28/2014  . Insomnia 08/31/2014  . Essential hypertension, benign 08/31/2014    Past Surgical History:  Procedure Laterality Date  . BREAST SURGERY Right    breast biopsy-benign  . SHOULDER SURGERY Right 2010  . TOE SURGERY Right Hammer Toe   2004  . TONSILLECTOMY      OB History    No data available       Home Medications    Prior to Admission medications   Medication Sig Start Date End Date Taking? Authorizing Provider  amoxicillin (AMOXIL) 500 MG capsule Take 1 capsule (500 mg total) 3 (three) times daily by mouth. 05/06/17    Noe Gens, PA-C  colchicine 0.6 MG tablet Take 1 tablet by mouth  daily 12/27/15   Breeback, Jade L, PA-C  FLUoxetine (PROZAC) 20 MG tablet Take 1 tablet (20 mg total) by mouth daily. 03/31/17   Breeback, Royetta Car, PA-C  meloxicam (MOBIC) 15 MG tablet Take 1 tablet (15 mg total) by mouth daily. As needed for back pain 03/31/17   Breeback, Jade L, PA-C  metoprolol succinate (TOPROL-XL) 100 MG 24 hr tablet TAKE 1 TABLET BY MOUTH DAILY. TAKE WITH OR IMMEDIATELY FOLLOWING A MEAL 03/31/17   Breeback, Jade L, PA-C  Olmesartan-Amlodipine-HCTZ 20-5-12.5 MG TABS Take 1 tablet by mouth daily. 03/31/17   Breeback, Jade L, PA-C  potassium chloride SA (K-DUR,KLOR-CON) 20 MEQ tablet TAKE 1 TABLET(20 MEQ) BY MOUTH DAILY 03/31/17   Breeback, Jade L, PA-C  pravastatin (PRAVACHOL) 40 MG tablet Take one tablet by mouth daily. 03/31/17   Breeback, Jade L, PA-C  predniSONE (DELTASONE) 20 MG tablet Take 2 tablets (40 mg total) daily with breakfast by mouth. 05/06/17   Noe Gens, PA-C  traZODone (DESYREL) 50 MG tablet TAKE 1 TO 2 TABLETS BY MOUTH AT BEDTIME FOR SLEEP 03/31/17   Breeback, Jade L, PA-C  traZODone (DESYREL) 50 MG tablet TAKE 1 TO 2 TABLETS BY MOUTH AT BEDTIME FOR SLEEP  04/05/17   Donella Stade, PA-C    Family History Family History  Problem Relation Age of Onset  . Heart attack Paternal Aunt   . Hypertension Mother   . Diabetes Maternal Aunt   . Cancer Paternal Aunt        stomach  . Cancer Sister     Social History Social History   Tobacco Use  . Smoking status: Never Smoker  . Smokeless tobacco: Never Used  Substance Use Topics  . Alcohol use: No    Alcohol/week: 0.0 oz  . Drug use: No     Allergies   Patient has no known allergies.   Review of Systems Review of Systems  Constitutional: Positive for fever. Negative for chills.  HENT: Positive for ear pain (mild Left) and sore throat. Negative for congestion, rhinorrhea, trouble swallowing and voice change.   Respiratory:  Positive for cough. Negative for chest tightness, shortness of breath and wheezing.   Gastrointestinal: Negative for abdominal pain, diarrhea, nausea and vomiting.  Neurological: Positive for headaches. Negative for dizziness and light-headedness.     Physical Exam Triage Vital Signs ED Triage Vitals  Enc Vitals Group     BP 05/06/17 1442 (!) 176/103     Pulse Rate 05/06/17 1442 84     Resp --      Temp 05/06/17 1442 98.5 F (36.9 C)     Temp Source 05/06/17 1442 Oral     SpO2 05/06/17 1442 96 %     Weight 05/06/17 1442 157 lb (71.2 kg)     Height 05/06/17 1442 4\' 11"  (1.499 m)     Head Circumference --      Peak Flow --      Pain Score 05/06/17 1443 7     Pain Loc --      Pain Edu? --      Excl. in [place]? --    No data found.  Updated Vital Signs BP (!) 176/103 (BP Location: Right Arm)   Pulse 84   Temp 98.5 F (36.9 C) (Oral)   Ht 4\' 11"  (1.499 m)   Wt 157 lb (71.2 kg)   SpO2 96%   BMI 31.71 kg/m     Physical Exam  Constitutional: She is oriented to person, place, and time. She appears well-developed and well-nourished.  Non-toxic appearance. She does not appear ill. No distress.  HENT:  Head: Normocephalic and atraumatic.  Right Ear: Tympanic membrane normal.  Left Ear: Tympanic membrane normal.  Nose: Nose normal. Right sinus exhibits no maxillary sinus tenderness and no frontal sinus tenderness. Left sinus exhibits no maxillary sinus tenderness and no frontal sinus tenderness.  Mouth/Throat: Uvula is midline and mucous membranes are normal. Posterior oropharyngeal erythema present. No oropharyngeal exudate, posterior oropharyngeal edema or tonsillar abscesses.  Eyes: EOM are normal.  Neck: Normal range of motion.  Left side large submandibular lymphadenopathy vs early abscess- tender w/o fluctuance.   Cardiovascular: Normal rate and regular rhythm.  Pulmonary/Chest: Effort normal and breath sounds normal.  Musculoskeletal: Normal range of motion.    Lymphadenopathy:    She has cervical adenopathy.  Neurological: She is alert and oriented to person, place, and time.  Skin: Skin is warm and dry.  Psychiatric: She has a normal mood and affect. Her behavior is normal.  Nursing note and vitals reviewed.    UC Treatments / Results  Labs (all labs ordered are listed, but only abnormal results are displayed) Labs Reviewed  POCT RAPID STREP A (OFFICE)  EKG  EKG Interpretation None       Radiology No results found.  Procedures Procedures (including critical care time)  Medications Ordered in UC Medications - No data to display   Initial Impression / Assessment and Plan / UC Course  I have reviewed the triage vital signs and the nursing notes.  Pertinent labs & imaging results that were available during my care of the patient were reviewed by me and considered in my medical decision making (see chart for details).     Rapid strep: Negative, however, pt does have moderate edema on Left side of neck/submandibular  Will cover for potential early abscess Discussed elevated BP- encouraged to take her medications as prescribed F/u with PCP next week for recheck of symptoms and BP  Final Clinical Impressions(s) / UC Diagnoses   Final diagnoses:  Pharyngitis, unspecified etiology  Neck pain on left side  Elevated blood pressure reading    ED Discharge Orders        Ordered    predniSONE (DELTASONE) 20 MG tablet  Daily with breakfast     05/06/17 1453    amoxicillin (AMOXIL) 500 MG capsule  3 times daily     05/06/17 1453       Controlled Substance Prescriptions [place] Controlled Substance Registry consulted? Not Applicable   Tyrell Antonio 05/06/17 [id number]

## 2017-05-19 ENCOUNTER — Encounter: Payer: Self-pay | Admitting: Physician Assistant

## 2017-05-19 ENCOUNTER — Other Ambulatory Visit: Payer: Self-pay | Admitting: Hematology & Oncology

## 2017-05-19 ENCOUNTER — Ambulatory Visit (INDEPENDENT_AMBULATORY_CARE_PROVIDER_SITE_OTHER): Payer: Medicare Other | Admitting: Physician Assistant

## 2017-05-19 VITALS — BP 134/82 | HR 88 | Ht 59.02 in | Wt 161.0 lb

## 2017-05-19 DIAGNOSIS — G8929 Other chronic pain: Secondary | ICD-10-CM

## 2017-05-19 DIAGNOSIS — E876 Hypokalemia: Secondary | ICD-10-CM

## 2017-05-19 DIAGNOSIS — I10 Essential (primary) hypertension: Secondary | ICD-10-CM

## 2017-05-19 DIAGNOSIS — R590 Localized enlarged lymph nodes: Secondary | ICD-10-CM | POA: Insufficient documentation

## 2017-05-19 DIAGNOSIS — M545 Low back pain: Secondary | ICD-10-CM | POA: Diagnosis not present

## 2017-05-19 DIAGNOSIS — R1901 Right upper quadrant abdominal swelling, mass and lump: Secondary | ICD-10-CM

## 2017-05-19 MED ORDER — OLMESARTAN-AMLODIPINE-HCTZ 20-5-12.5 MG PO TABS: 1.0000 | ORAL_TABLET | Freq: Every day | ORAL | 1 refills | Status: DC

## 2017-05-19 MED ORDER — MELOXICAM 15 MG PO TABS: 15.0000 mg | ORAL_TABLET | Freq: Every day | ORAL | 1 refills | Status: DC

## 2017-05-19 MED ORDER — METOPROLOL SUCCINATE ER 100 MG PO TB24: ORAL_TABLET | ORAL | 1 refills | Status: DC

## 2017-05-19 NOTE — Progress Notes (Signed)
Subjective:    Patient ID: Bonnie Torres, female    DOB: Apr 23, 1956, 61 y.o.   MRN: [id number]  HPI  Patient is a 61 year old female with a past medical history of a retroperitoneal sarcoma who presents to the clinic to follow-up after urgent care visit for pharyngitis.  She was seen in urgent care on 05/06/2017.  She was given amoxicillin and prednisone.  She has finished both.  She feels significantly better.  She denies any fever, chills, sore throat, headache, ear pain, cough, shortness of breath, wheezing.  Urgent care was particularly concerned with her left sided neck swelling.  They even mention in their note concern for abscess.  Hypertension-patient admits to taking her blood pressure medications most days.  She does occasionally forget.  She denies any chest pain, palpitations, headaches, vision changes.  Patient requests refill for meloxicam for her ongoing chronic back pain that she uses as needed.  .. Active Ambulatory Problems    Diagnosis Date Noted  . Insomnia 08/31/2014  . Essential hypertension, benign 08/31/2014  . Hyperlipidemia 09/28/2014  . Bilateral low back pain without sciatica 04/17/2015  . Primary gout 10/16/2015  . Breast calcification, right 11/13/2015  . Elevated testosterone level in female 03/25/2016  . Mass of kidney 04/05/2016  . Uterine mass 04/27/2016  . Lymphangioma 06/18/2016  . Hot flashes due to surgical menopause 08/19/2016  . Obesity (BMI 30.0-34.9) 09/16/2016  . Hypokalemia 04/02/2017  . Anterior cervical adenopathy 05/19/2017   Resolved Ambulatory Problems    Diagnosis Date Noted  . Mass of omentum 04/05/2016  . Hirsutism 04/05/2016  . Mass of ovary 04/05/2016  . Renal cell carcinoma ([place]) 08/18/2016   Past Medical History:  Diagnosis Date  . Gout   . Heart rate fast   . Hyperlipidemia   . Hypertension   . 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       Objective:   Physical Exam  Constitutional: She is oriented to person, place, and time. She appears well-developed and well-nourished.  HENT:  Head: Normocephalic and atraumatic.  Right Ear: External ear normal.  Left Ear: External ear normal.  Nose: Nose normal.  Mouth/Throat: Oropharynx is clear and moist. No oropharyngeal exudate.  Eyes: Conjunctivae are normal. Right eye exhibits no discharge. Left eye exhibits no discharge.  Neck:  Left sided only approximately 2cm by 1cm adenopathy, non tender.   Cardiovascular: Normal rate, regular rhythm and normal heart sounds.  Pulmonary/Chest: Effort normal and breath sounds normal. She has no wheezes.  Lymphadenopathy:    She has cervical adenopathy.  Neurological: She is alert and oriented to person, place, and time.  Psychiatric: She has a normal mood and affect. Her behavior is normal.          Assessment & Plan:  Marland KitchenMarland KitchenDiagnoses and all orders for this visit:  Essential hypertension, benign -     Olmesartan-Amlodipine-HCTZ 20-5-12.5 MG TABS; Take 1 tablet by mouth daily. -     metoprolol succinate (TOPROL-XL) 100 MG 24 hr tablet; TAKE 1 TABLET BY MOUTH DAILY. TAKE WITH OR IMMEDIATELY FOLLOWING A MEAL  Anterior cervical adenopathy  Chronic bilateral low back pain without sciatica -     meloxicam (MOBIC) 15 MG tablet; Take 1 tablet (15 mg total) by mouth daily. As needed for back pain   Patient seems to have completely resolved from her acute pharyngitis.  She does have residual left-sided adenopathy.  This is  nontender.  With her history of tumors I am always a little more suspicious.  I asked the patient to follow-up in 2-3 weeks to make sure adenopathy has resolved.  Patient has no fever today and her vital signs look good.  If this were to return I asked her to follow-up in urgent care or in our office for evaluation.  Physical exam today is normal otherwise.  Blood pressure medications refilled today.  Her blood pressure did  normalize at second recheck.

## 2017-05-24 ENCOUNTER — Other Ambulatory Visit: Payer: Self-pay | Admitting: Family Medicine

## 2017-05-24 DIAGNOSIS — I10 Essential (primary) hypertension: Secondary | ICD-10-CM

## 2017-05-26 ENCOUNTER — Ambulatory Visit: Payer: Medicare Other

## 2017-05-27 ENCOUNTER — Ambulatory Visit (INDEPENDENT_AMBULATORY_CARE_PROVIDER_SITE_OTHER): Payer: Medicare Other

## 2017-05-27 DIAGNOSIS — Z1231 Encounter for screening mammogram for malignant neoplasm of breast: Secondary | ICD-10-CM

## 2017-05-27 IMAGING — MG 2D DIGITAL SCREENING BILATERAL MAMMOGRAM WITH CAD AND ADJUNCT TO
6 of 9 series · 6 of 25 positions shown · non-contrast
Comparison: Previous exam(s).

CLINICAL DATA: Screening.

EXAM:
2D DIGITAL SCREENING BILATERAL MAMMOGRAM WITH CAD AND ADJUNCT TOMO

[L CC (1 of 2)]
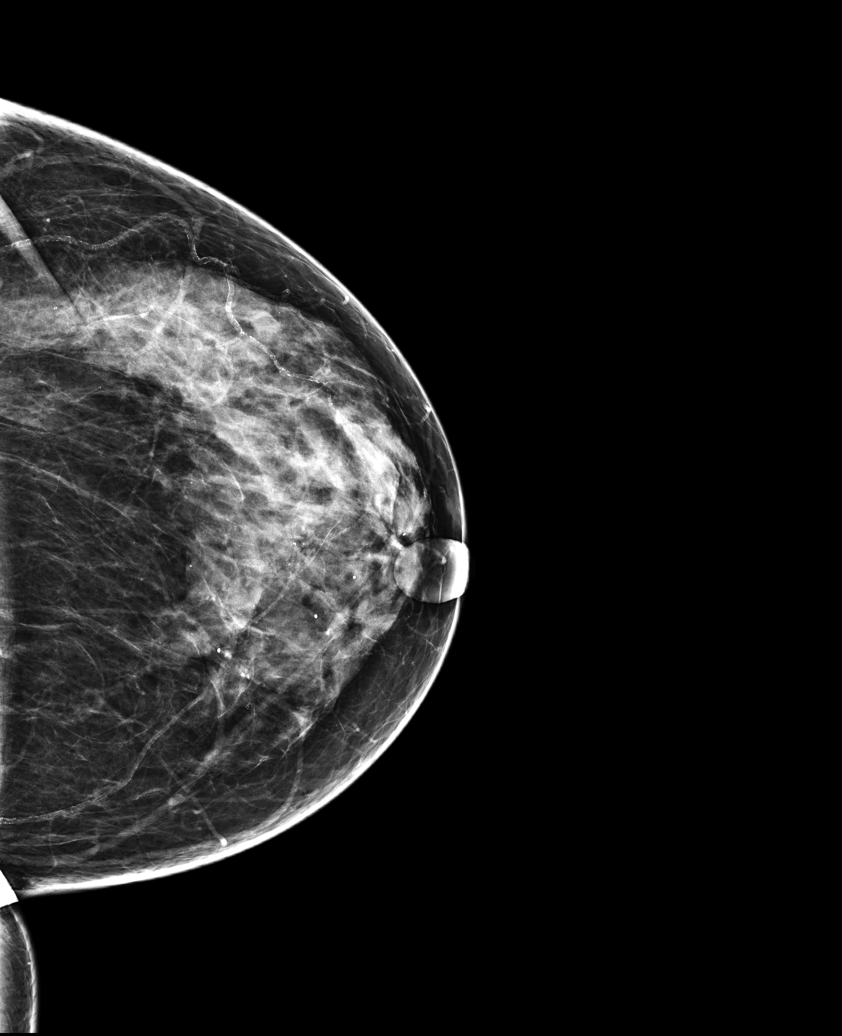

[R CC]
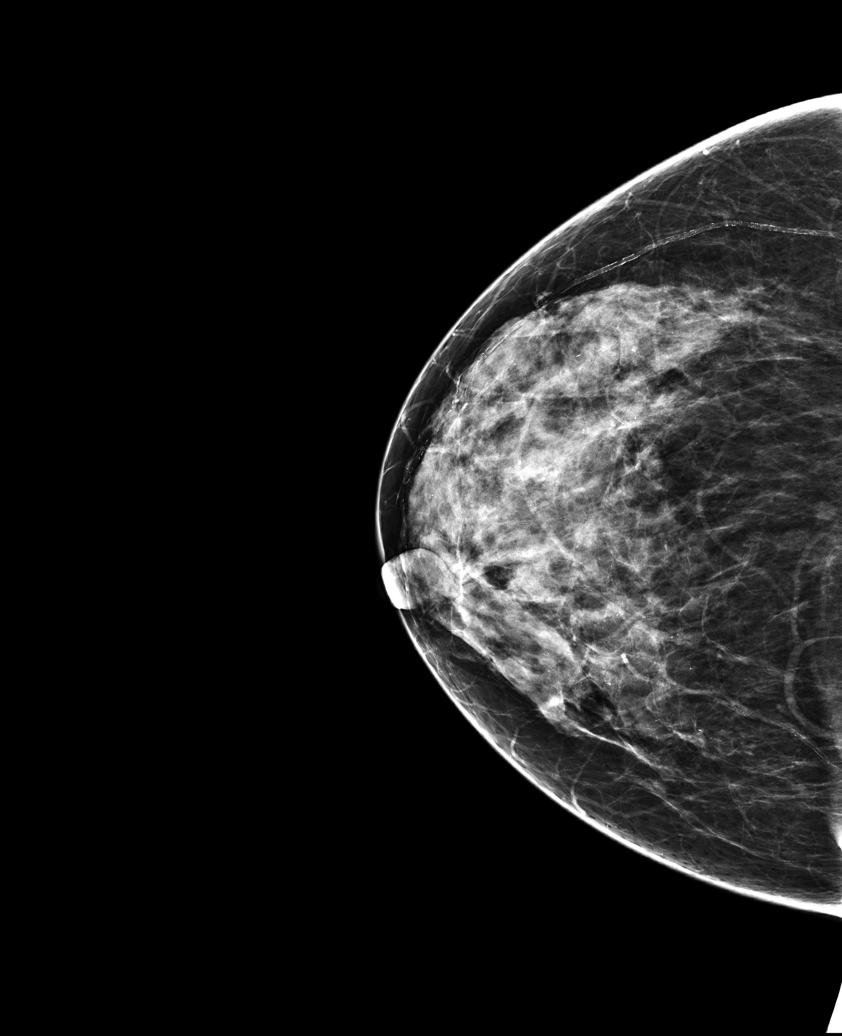

[L MLO]
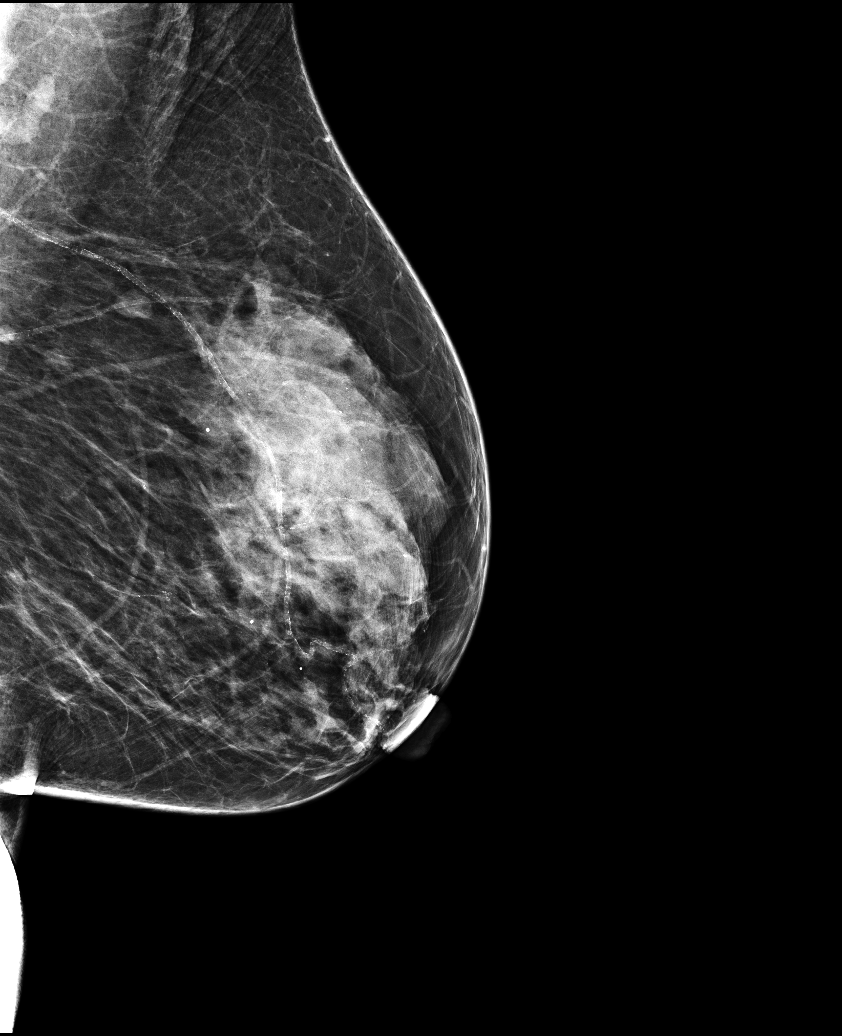

[L CC (2 of 2)]
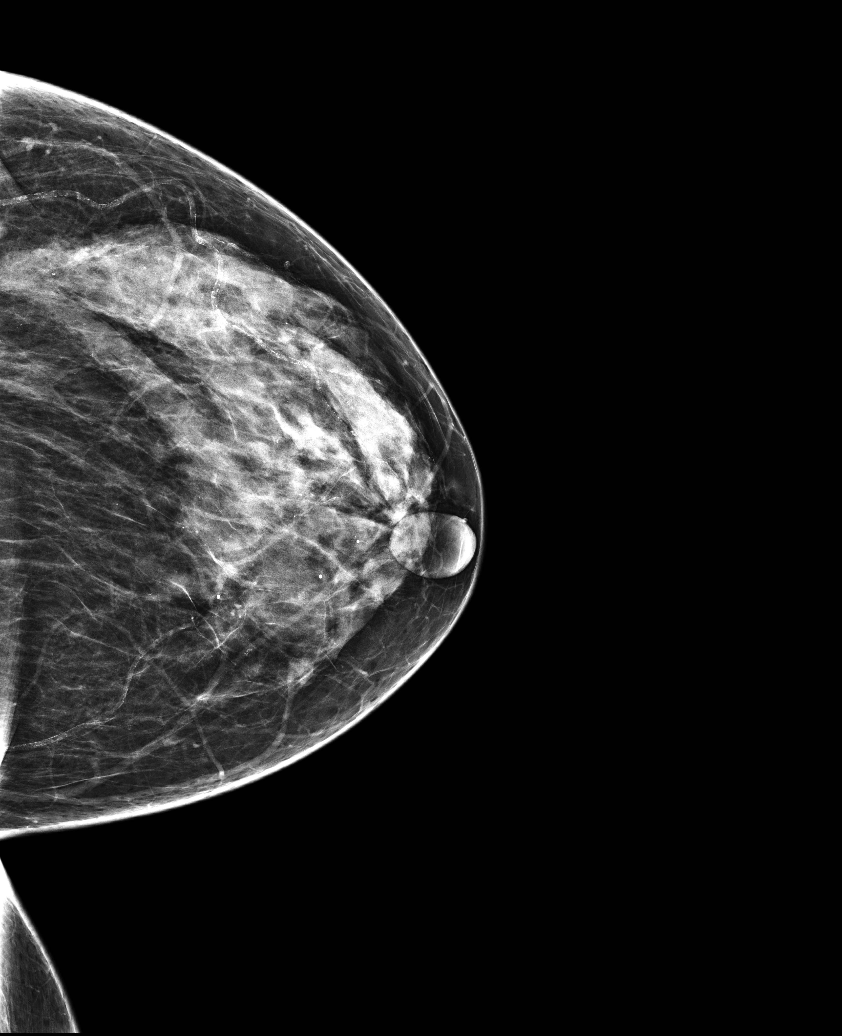

[R MLO]
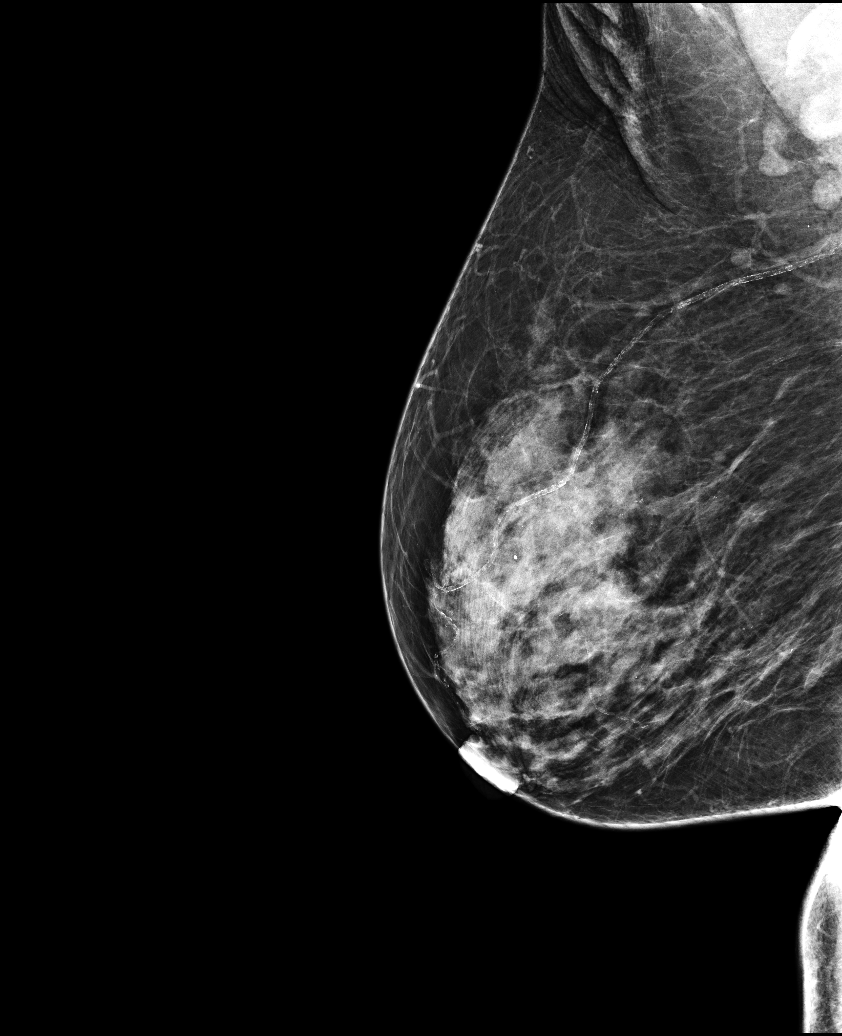

[R CC tomo · tomo slice 34/67.0]
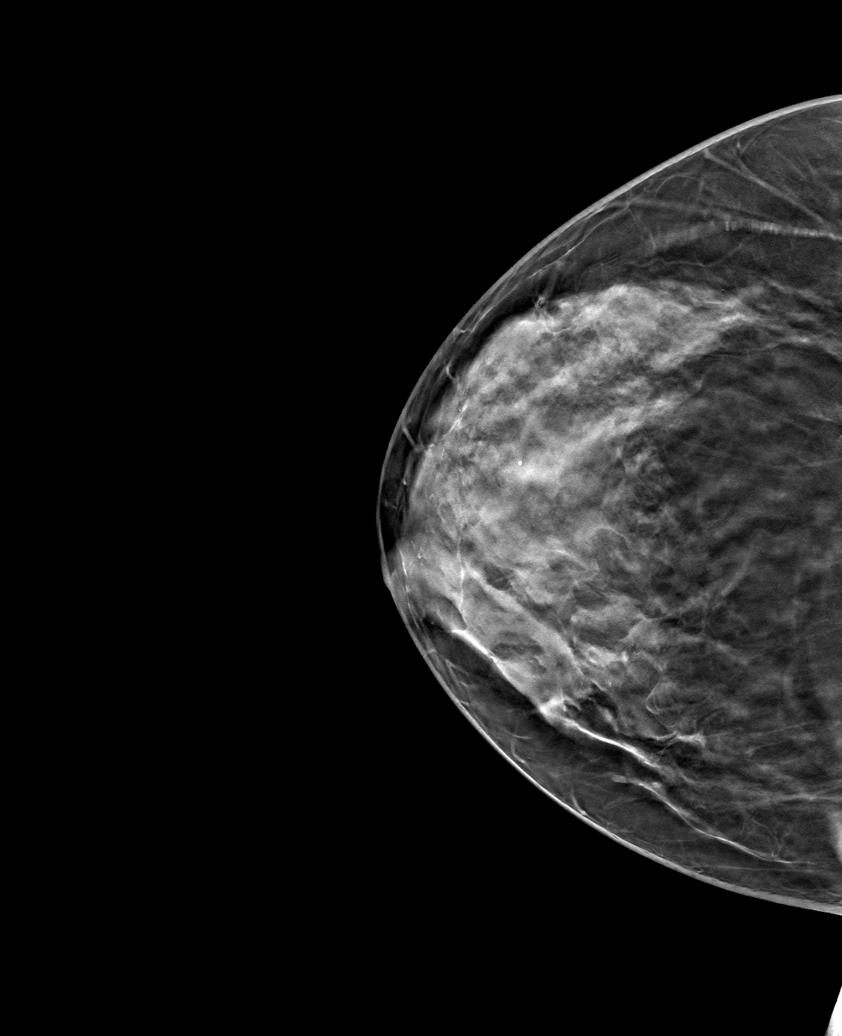

[6 of 25 positions shown; findings below may reference images not displayed]

ACR Breast Density Category c: The breast tissue is heterogeneously
dense, which may obscure small masses.
FINDINGS: There are no findings suspicious for malignancy. Images were
processed with CAD.
IMPRESSION: No mammographic evidence of malignancy. A result letter of this
screening mammogram will be mailed directly to the patient.

RECOMMENDATION:
Screening mammogram in one year. (Code:[TA])

BI-RADS CATEGORY  1: Negative.

## 2017-05-27 NOTE — Progress Notes (Signed)
Call pt: normal mammogram follow up in 1 year.

## 2017-06-28 ENCOUNTER — Other Ambulatory Visit: Payer: Self-pay | Admitting: Physician Assistant

## 2017-06-28 DIAGNOSIS — F5101 Primary insomnia: Secondary | ICD-10-CM

## 2017-06-28 NOTE — Telephone Encounter (Signed)
Ok to fill 

## 2017-07-02 ENCOUNTER — Telehealth: Payer: Self-pay | Admitting: Physician Assistant

## 2017-07-02 DIAGNOSIS — L680 Hirsutism: Secondary | ICD-10-CM

## 2017-07-02 NOTE — Telephone Encounter (Signed)
Pt called and requested a referral to Dermatology for unwanted facial hair

## 2017-07-05 NOTE — Telephone Encounter (Signed)
Ok to place for hirsutism.

## 2017-07-05 NOTE — Telephone Encounter (Signed)
Routing to PCP for approval.

## 2017-07-05 NOTE — Telephone Encounter (Signed)
Referral placed.

## 2017-08-27 DIAGNOSIS — C641 Malignant neoplasm of right kidney, except renal pelvis: Secondary | ICD-10-CM | POA: Diagnosis not present

## 2017-08-27 DIAGNOSIS — N2889 Other specified disorders of kidney and ureter: Secondary | ICD-10-CM | POA: Diagnosis not present

## 2017-08-27 DIAGNOSIS — R918 Other nonspecific abnormal finding of lung field: Secondary | ICD-10-CM | POA: Diagnosis not present

## 2017-09-01 DIAGNOSIS — C641 Malignant neoplasm of right kidney, except renal pelvis: Secondary | ICD-10-CM | POA: Diagnosis not present

## 2017-09-01 DIAGNOSIS — D49512 Neoplasm of unspecified behavior of left kidney: Secondary | ICD-10-CM | POA: Diagnosis not present

## 2017-09-06 DIAGNOSIS — L700 Acne vulgaris: Secondary | ICD-10-CM | POA: Diagnosis not present

## 2017-10-11 ENCOUNTER — Telehealth: Payer: Self-pay | Admitting: Physician Assistant

## 2017-10-25 DIAGNOSIS — Z85528 Personal history of other malignant neoplasm of kidney: Secondary | ICD-10-CM | POA: Diagnosis not present

## 2017-10-25 DIAGNOSIS — L0391 Acute lymphangitis, unspecified: Secondary | ICD-10-CM | POA: Diagnosis not present

## 2017-10-25 DIAGNOSIS — J028 Acute pharyngitis due to other specified organisms: Secondary | ICD-10-CM | POA: Diagnosis not present

## 2017-10-25 DIAGNOSIS — I891 Lymphangitis: Secondary | ICD-10-CM | POA: Diagnosis not present

## 2017-11-03 DIAGNOSIS — L700 Acne vulgaris: Secondary | ICD-10-CM | POA: Diagnosis not present

## 2017-11-11 ENCOUNTER — Other Ambulatory Visit: Payer: Self-pay | Admitting: Physician Assistant

## 2017-11-11 DIAGNOSIS — I10 Essential (primary) hypertension: Secondary | ICD-10-CM

## 2017-11-17 ENCOUNTER — Other Ambulatory Visit: Payer: Self-pay | Admitting: *Deleted

## 2017-11-17 ENCOUNTER — Other Ambulatory Visit: Payer: Self-pay | Admitting: Physician Assistant

## 2017-11-17 DIAGNOSIS — I10 Essential (primary) hypertension: Secondary | ICD-10-CM

## 2017-11-17 MED ORDER — METOPROLOL SUCCINATE ER 100 MG PO TB24: ORAL_TABLET | ORAL | 0 refills | Status: DC

## 2017-11-23 LAB — FECAL OCCULT BLOOD, GUAIAC: Fecal Occult Blood: NEGATIVE

## 2017-11-25 DIAGNOSIS — H5213 Myopia, bilateral: Secondary | ICD-10-CM | POA: Diagnosis not present

## 2017-11-25 DIAGNOSIS — H524 Presbyopia: Secondary | ICD-10-CM | POA: Diagnosis not present

## 2017-11-25 LAB — HM DIABETES EYE EXAM

## 2017-12-01 ENCOUNTER — Telehealth: Payer: Self-pay | Admitting: Physician Assistant

## 2017-12-01 ENCOUNTER — Encounter: Payer: Self-pay | Admitting: Physician Assistant

## 2017-12-01 ENCOUNTER — Ambulatory Visit (INDEPENDENT_AMBULATORY_CARE_PROVIDER_SITE_OTHER): Payer: Medicare Other | Admitting: Physician Assistant

## 2017-12-01 VITALS — BP 119/78 | HR 88 | Ht 59.0 in | Wt 166.0 lb

## 2017-12-01 DIAGNOSIS — E7800 Pure hypercholesterolemia, unspecified: Secondary | ICD-10-CM | POA: Diagnosis not present

## 2017-12-01 DIAGNOSIS — G8929 Other chronic pain: Secondary | ICD-10-CM

## 2017-12-01 DIAGNOSIS — Z13220 Encounter for screening for lipoid disorders: Secondary | ICD-10-CM | POA: Diagnosis not present

## 2017-12-01 DIAGNOSIS — E119 Type 2 diabetes mellitus without complications: Secondary | ICD-10-CM

## 2017-12-01 DIAGNOSIS — E876 Hypokalemia: Secondary | ICD-10-CM | POA: Diagnosis not present

## 2017-12-01 DIAGNOSIS — Z6833 Body mass index (BMI) 33.0-33.9, adult: Secondary | ICD-10-CM | POA: Diagnosis not present

## 2017-12-01 DIAGNOSIS — M545 Low back pain, unspecified: Secondary | ICD-10-CM

## 2017-12-01 DIAGNOSIS — I10 Essential (primary) hypertension: Secondary | ICD-10-CM | POA: Diagnosis not present

## 2017-12-01 DIAGNOSIS — F5101 Primary insomnia: Secondary | ICD-10-CM | POA: Diagnosis not present

## 2017-12-01 DIAGNOSIS — E6609 Other obesity due to excess calories: Secondary | ICD-10-CM

## 2017-12-01 DIAGNOSIS — M65332 Trigger finger, left middle finger: Secondary | ICD-10-CM | POA: Diagnosis not present

## 2017-12-01 DIAGNOSIS — Z131 Encounter for screening for diabetes mellitus: Secondary | ICD-10-CM | POA: Diagnosis not present

## 2017-12-01 DIAGNOSIS — Z23 Encounter for immunization: Secondary | ICD-10-CM

## 2017-12-01 MED ORDER — POTASSIUM CHLORIDE CRYS ER 20 MEQ PO TBCR: EXTENDED_RELEASE_TABLET | ORAL | 1 refills | Status: DC

## 2017-12-01 MED ORDER — METFORMIN HCL ER 750 MG PO TB24: 750.0000 mg | ORAL_TABLET | Freq: Every day | ORAL | 2 refills | Status: DC

## 2017-12-01 MED ORDER — OLMESARTAN-AMLODIPINE-HCTZ 20-5-12.5 MG PO TABS: 1.0000 | ORAL_TABLET | Freq: Every day | ORAL | 1 refills | Status: DC

## 2017-12-01 MED ORDER — MELOXICAM 15 MG PO TABS: 15.0000 mg | ORAL_TABLET | Freq: Every day | ORAL | 1 refills | Status: DC

## 2017-12-01 MED ORDER — TRAZODONE HCL 50 MG PO TABS: ORAL_TABLET | ORAL | 1 refills | Status: DC

## 2017-12-01 MED ORDER — METOPROLOL SUCCINATE ER 100 MG PO TB24: ORAL_TABLET | ORAL | 1 refills | Status: DC

## 2017-12-01 MED ORDER — PRAVASTATIN SODIUM 40 MG PO TABS: ORAL_TABLET | ORAL | 4 refills | Status: DC

## 2017-12-01 NOTE — Telephone Encounter (Signed)
Need eye exam from [facility] for yearly exam.

## 2017-12-01 NOTE — Telephone Encounter (Signed)
Fax request placed with front office.

## 2017-12-01 NOTE — Progress Notes (Signed)
Subjective:    Patient ID: Bonnie Torres, female    DOB: [birth year], 62 y.o.   MRN: [id number]  HPI  Patient is a 62 year old female who presents to the clinic to follow-up on lab results that insurance collected on a screening where they found her A1c to be 7.3 with no prior history of diabetes.  She admits that she is not watching her diet or exercising.  Since she found out about the elevated sugar she has tried to make some diet changes in the mornings she is drinking the neck and smoothies.  Most lunches she reports not eating.  In the evening she is trying to eat more salads.   Patient also reports on her middle left finger pain and catching.  This is been going on for some time but she has not addressed it.  She has not tried anything to make better.  It does seem to be a little better than usual.  .. Active Ambulatory Problems    Diagnosis Date Noted  . Insomnia 08/31/2014  . Essential hypertension, benign 08/31/2014  . Hyperlipidemia 09/28/2014  . Bilateral low back pain without sciatica 04/17/2015  . Primary gout 10/16/2015  . Breast calcification, right 11/13/2015  . Elevated testosterone level in female 03/25/2016  . Mass of kidney 04/05/2016  . Uterine mass 04/27/2016  . Lymphangioma 06/18/2016  . Hot flashes due to surgical menopause 08/19/2016  . Obesity (BMI 30.0-34.9) 09/16/2016  . Hypokalemia 04/02/2017  . Anterior cervical adenopathy 05/19/2017  . Trigger middle finger of left hand 12/01/2017   Resolved Ambulatory Problems    Diagnosis Date Noted  . Mass of omentum 04/05/2016  . Hirsutism 04/05/2016  . Mass of ovary 04/05/2016  . Renal cell carcinoma ([place]) 08/18/2016   Past Medical History:  Diagnosis Date  . Gout   . Heart rate fast   . Hyperlipidemia   . Hypertension   . 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      Objective:   Physical Exam  Constitutional: She is  oriented to person, place, and time. She appears well-developed and well-nourished.  HENT:  Head: Normocephalic and atraumatic.  Cardiovascular: Normal rate and regular rhythm.  Pulmonary/Chest: Effort normal and breath sounds normal.  Musculoskeletal:  Left middle finger catching. No pain to palpation, redness, or swelling.   Neurological: She is alert and oriented to person, place, and time.  Psychiatric: She has a normal mood and affect. Her behavior is normal.          Assessment & Plan:  Marland KitchenMarland KitchenDiagnoses and all orders for this visit:  Type 2 diabetes mellitus without complication, without long-term current use of insulin (HCC) -     metFORMIN (GLUCOPHAGE XR) 750 MG 24 hr tablet; Take 1 tablet (750 mg total) by mouth daily with breakfast. -     Pneumococcal polysaccharide vaccine 23-valent greater than or equal to 2yo subcutaneous/IM -     Referral to Nutrition and Diabetes Services  Class 1 obesity due to excess calories without serious comorbidity with body mass index (BMI) of 33.0 to 33.9 in adult -     Referral to Nutrition and Diabetes Services  Screening for lipid disorders -     Lipid Panel w/reflex Direct LDL  Screening for diabetes mellitus -     COMPLETE METABOLIC PANEL WITH GFR  Chronic bilateral low back pain without sciatica -     meloxicam (  MOBIC) 15 MG tablet; Take 1 tablet (15 mg total) by mouth daily. As needed for back pain  Essential hypertension, benign -     metoprolol succinate (TOPROL-XL) 100 MG 24 hr tablet; TAKE 1 TABLET BY MOUTH DAILY. TAKE WITH OR IMMEDIATELY FOLLOWING A MEAL -     Olmesartan-amLODIPine-HCTZ 20-5-12.5 MG TABS; Take 1 tablet by mouth daily.  Hypokalemia -     potassium chloride SA (K-DUR,KLOR-CON) 20 MEQ tablet; TAKE 1 TABLET(20 MEQ) BY MOUTH DAILY  Primary insomnia -     traZODone (DESYREL) 50 MG tablet; TAKE 1 TO 2 TABLETS BY MOUTH AT BEDTIME FOR SLEEP  Pure hypercholesterolemia -     pravastatin (PRAVACHOL) 40 MG tablet; Take  one tablet by mouth daily.  Trigger middle finger of left hand -     meloxicam (MOBIC) 15 MG tablet; Take 1 tablet (15 mg total) by mouth daily. As needed for back pain   Discussed DM and disease process.  Referral made to nutritionist.  Discussed medication options.  Start metformin. Discussed side effects.  On STATIN.  On ARB. BP controlled.  Needs DM eye exam.  Follow up in 3 months.   Medication refills.   Discussed trigger finger. HO given. Recommended referral to sports medicine for injection and to consider trigger finger splint. NSAIDs for discomfort and follow up as needed.   [name].Spent 30 minutes with patient and greater than 50 percent of visit spent counseling patient regarding treatment plan.

## 2017-12-01 NOTE — Patient Instructions (Addendum)
Trigger Finger Trigger finger (stenosing tenosynovitis) is a condition that causes a finger to get stuck in a bent position. Each finger has a tough, cord-like tissue that connects muscle to bone (tendon), and each tendon is surrounded by a tunnel of tissue (tendon sheath). To move your finger, your tendon needs to slide freely through the sheath. Trigger finger happens when the tendon or the sheath thickens, making it difficult to move your finger. Trigger finger can affect any finger or a thumb. It may affect more than one finger. Mild cases may clear up with rest and medicine. Severe cases require more treatment. What are the causes? Trigger finger is caused by a thickened finger tendon or tendon sheath. The cause of this thickening is not known. What increases the risk? The following factors may make you more likely to develop this condition:  Doing activities that require a strong grip.  Having rheumatoid arthritis, gout, or diabetes.  Being 74-48 years old.  Being a woman.  What are the signs or symptoms? Symptoms of this condition include:  Pain when bending or straightening your finger.  Tenderness or swelling where your finger attaches to the palm of your hand.  A lump in the palm of your hand or on the inside of your finger.  Hearing a popping sound when you try to straighten your finger.  Feeling a popping, catching, or locking sensation when you try to straighten your finger.  Being unable to straighten your finger.  How is this diagnosed? This condition is diagnosed based on your symptoms and a physical exam. How is this treated? This condition may be treated by:  Resting your finger and avoiding activities that make symptoms worse.  Wearing a finger splint to keep your finger in a slightly bent position.  Taking NSAIDs to relieve pain and swelling.  Injecting medicine (steroids) into the tendon sheath to reduce swelling and irritation. Injections may need  to be repeated.  Having surgery to open the tendon sheath. This may be done if other treatments do not work and you cannot straighten your finger. You may need physical therapy after surgery.  Follow these instructions at home:  Use moist heat to help reduce pain and swelling as told by your health care provider.  Rest your finger and avoid activities that make pain worse. Return to normal activities as told by your health care provider.  If you have a splint, wear it as told by your health care provider.  Take over-the-counter and prescription medicines only as told by your health care provider.  Keep all follow-up visits as told by your health care provider. This is important. Contact a health care provider if:  Your symptoms are not improving with home care. Summary  Trigger finger (stenosing tenosynovitis) causes your finger to get stuck in a bent position, and it can make it difficult and painful to straighten your finger.  This condition develops when a finger tendon or tendon sheath thickens.  Treatment starts with resting, wearing a splint, and taking NSAIDs.  In severe cases, surgery to open the tendon sheath may be needed. This information is not intended to replace advice given to you by your health care provider. Make sure you discuss any questions you have with your health care provider. Document Released: 04/04/2004 Document Revised: 05/26/2016 Document Reviewed: 05/26/2016 Elsevier Interactive Patient Education  2017 [employer].   Diabetes Mellitus and Nutrition When you have diabetes (diabetes mellitus), it is very important to have healthy eating habits  because your blood sugar (glucose) levels are greatly affected by what you eat and drink. Eating healthy foods in the appropriate amounts, at about the same times every day, can help you:  Control your blood glucose.  Lower your risk of heart disease.  Improve your blood pressure.  Reach or maintain a  healthy weight.  Every person with diabetes is different, and each person has different needs for a meal plan. Your health care provider may recommend that you work with a diet and nutrition specialist (dietitian) to make a meal plan that is best for you. Your meal plan may vary depending on factors such as:  The calories you need.  The medicines you take.  Your weight.  Your blood glucose, blood pressure, and cholesterol levels.  Your activity level.  Other health conditions you have, such as heart or kidney disease.  How do carbohydrates affect me? Carbohydrates affect your blood glucose level more than any other type of food. Eating carbohydrates naturally increases the amount of glucose in your blood. Carbohydrate counting is a method for keeping track of how many carbohydrates you eat. Counting carbohydrates is important to keep your blood glucose at a healthy level, especially if you use insulin or take certain oral diabetes medicines. It is important to know how many carbohydrates you can safely have in each meal. This is different for every person. Your dietitian can help you calculate how many carbohydrates you should have at each meal and for snack. Foods that contain carbohydrates include:  Bread, cereal, rice, pasta, and crackers.  Potatoes and corn.  Peas, beans, and lentils.  Milk and yogurt.  Fruit and juice.  Desserts, such as cakes, cookies, ice cream, and candy.  How does alcohol affect me? Alcohol can cause a sudden decrease in blood glucose (hypoglycemia), especially if you use insulin or take certain oral diabetes medicines. Hypoglycemia can be a life-threatening condition. Symptoms of hypoglycemia (sleepiness, dizziness, and confusion) are similar to symptoms of having too much alcohol. If your health care provider says that alcohol is safe for you, follow these guidelines:  Limit alcohol intake to no more than 1 drink per day for nonpregnant women and 2  drinks per day for men. One drink equals 12 oz of beer, 5 oz of wine, or 1 oz of hard liquor.  Do not drink on an empty stomach.  Keep yourself hydrated with water, diet soda, or unsweetened iced tea.  Keep in mind that regular soda, juice, and other mixers may contain a lot of sugar and must be counted as carbohydrates.  What are tips for following this plan? Reading food labels  Start by checking the serving size on the label. The amount of calories, carbohydrates, fats, and other nutrients listed on the label are based on one serving of the food. Many foods contain more than one serving per package.  Check the total grams (g) of carbohydrates in one serving. You can calculate the number of servings of carbohydrates in one serving by dividing the total carbohydrates by 15. For example, if a food has 30 g of total carbohydrates, it would be equal to 2 servings of carbohydrates.  Check the number of grams (g) of saturated and trans fats in one serving. Choose foods that have low or no amount of these fats.  Check the number of milligrams (mg) of sodium in one serving. Most people should limit total sodium intake to less than 2,300 mg per day.  Always check the nutrition information  of foods labeled as "low-fat" or "nonfat". These foods may be higher in added sugar or refined carbohydrates and should be avoided.  Talk to your dietitian to identify your daily goals for nutrients listed on the label. Shopping  Avoid buying canned, premade, or processed foods. These foods tend to be high in fat, sodium, and added sugar.  Shop around the outside edge of the grocery store. This includes fresh fruits and vegetables, bulk grains, fresh meats, and fresh dairy. Cooking  Use low-heat cooking methods, such as baking, instead of high-heat cooking methods like deep frying.  Cook using healthy oils, such as olive, canola, or sunflower oil.  Avoid cooking with butter, cream, or high-fat  meats. Meal planning  Eat meals and snacks regularly, preferably at the same times every day. Avoid going long periods of time without eating.  Eat foods high in fiber, such as fresh fruits, vegetables, beans, and whole grains. Talk to your dietitian about how many servings of carbohydrates you can eat at each meal.  Eat 4-6 ounces of lean protein each day, such as lean meat, chicken, fish, eggs, or tofu. 1 ounce is equal to 1 ounce of meat, chicken, or fish, 1 egg, or 1/4 cup of tofu.  Eat some foods each day that contain healthy fats, such as avocado, nuts, seeds, and fish. Lifestyle   Check your blood glucose regularly.  Exercise at least 30 minutes 5 or more days each week, or as told by your health care provider.  Take medicines as told by your health care provider.  Do not use any products that contain nicotine or tobacco, such as cigarettes and e-cigarettes. If you need help quitting, ask your health care provider.  Work with a Social worker or diabetes educator to identify strategies to manage stress and any emotional and social challenges. What are some questions to ask my health care provider?  Do I need to meet with a diabetes educator?  Do I need to meet with a dietitian?  What number can I call if I have questions?  When are the best times to check my blood glucose? Where to find more information:  American Diabetes Association: diabetes.org/food-and-fitness/food  Academy of Nutrition and Dietetics: PokerClues.dk  [employer] of Diabetes and Digestive and Kidney Diseases (NIH): ContactWire.be Summary  A healthy meal plan will help you control your blood glucose and maintain a healthy lifestyle.  Working with a diet and nutrition specialist (dietitian) can help you make a meal plan that is best for you.  Keep in mind that carbohydrates  and alcohol have immediate effects on your blood glucose levels. It is important to count carbohydrates and to use alcohol carefully. This information is not intended to replace advice given to you by your health care provider. Make sure you discuss any questions you have with your health care provider. Document Released: 03/12/2005 Document Revised: 07/20/2016 Document Reviewed: 07/20/2016 Elsevier Interactive Patient Education  [employer].

## 2017-12-03 ENCOUNTER — Encounter: Payer: Self-pay | Admitting: Physician Assistant

## 2017-12-03 DIAGNOSIS — E6609 Other obesity due to excess calories: Secondary | ICD-10-CM | POA: Insufficient documentation

## 2017-12-03 DIAGNOSIS — Z6833 Body mass index (BMI) 33.0-33.9, adult: Secondary | ICD-10-CM

## 2017-12-03 DIAGNOSIS — N1831 Chronic kidney disease, stage 3a: Secondary | ICD-10-CM | POA: Insufficient documentation

## 2017-12-03 DIAGNOSIS — E119 Type 2 diabetes mellitus without complications: Secondary | ICD-10-CM

## 2017-12-03 HISTORY — DX: Type 2 diabetes mellitus without complications: E11.9

## 2017-12-06 ENCOUNTER — Encounter: Payer: Self-pay | Admitting: Physician Assistant

## 2017-12-06 DIAGNOSIS — Z13220 Encounter for screening for lipoid disorders: Secondary | ICD-10-CM | POA: Diagnosis not present

## 2017-12-06 DIAGNOSIS — E7800 Pure hypercholesterolemia, unspecified: Secondary | ICD-10-CM | POA: Diagnosis not present

## 2017-12-06 DIAGNOSIS — Z131 Encounter for screening for diabetes mellitus: Secondary | ICD-10-CM | POA: Diagnosis not present

## 2017-12-06 LAB — COMPLETE METABOLIC PANEL WITH GFR
AG Ratio: 1.4 (calc) (ref 1.0–2.5)
ALT: 11 U/L (ref 6–29)
AST: 15 U/L (ref 10–35)
Albumin: 4.5 g/dL (ref 3.6–5.1)
Alkaline phosphatase (APISO): 62 U/L (ref 33–130)
BUN/Creatinine Ratio: 18 (calc) (ref 6–22)
BUN: 27 mg/dL — ABNORMAL HIGH (ref 7–25)
CALCIUM: 10.2 mg/dL (ref 8.6–10.4)
CO2: 24 mmol/L (ref 20–32)
CREATININE: 1.47 mg/dL — AB (ref 0.50–0.99)
Chloride: 103 mmol/L (ref 98–110)
GFR, EST NON AFRICAN AMERICAN: 38 mL/min/{1.73_m2} — AB (ref >=60)
GFR, Est African American: 45 mL/min/{1.73_m2} — ABNORMAL LOW (ref >=60)
GLOBULIN: 3.2 g/dL (ref 1.9–3.7)
Glucose, Bld: 104 mg/dL — ABNORMAL HIGH (ref 65–99)
Potassium: 4.8 mmol/L (ref 3.5–5.3)
SODIUM: 135 mmol/L (ref 135–146)
Total Bilirubin: 0.5 mg/dL (ref 0.2–1.2)
Total Protein: 7.7 g/dL (ref 6.1–8.1)

## 2017-12-06 LAB — LIPID PANEL W/REFLEX DIRECT LDL
CHOL/HDL RATIO: 4.1 (calc) (ref <5.0)
CHOLESTEROL: 168 mg/dL (ref <200)
HDL: 41 mg/dL — ABNORMAL LOW (ref >50)
LDL Cholesterol (Calc): 109 mg/dL (calc) — ABNORMAL HIGH
Non-HDL Cholesterol (Calc): 127 mg/dL (calc) (ref <130)
Triglycerides: 89 mg/dL (ref <150)

## 2017-12-07 ENCOUNTER — Encounter: Payer: Self-pay | Admitting: Physician Assistant

## 2017-12-07 DIAGNOSIS — R7989 Other specified abnormal findings of blood chemistry: Secondary | ICD-10-CM | POA: Insufficient documentation

## 2017-12-07 NOTE — Progress Notes (Signed)
Call pt: LDL not quite to goal since you have new DM. Could we start a low dose statin to lower cholesterol and CV risk?   Your kidney function spiked up quite a bit. HOLD meloxicam that was given at last visit and stop any NSAIDs(aleve/ibuprofen/motrin). Make sure staying hydrated. Follow up in 2 weeks for recheck.

## 2017-12-08 NOTE — Progress Notes (Signed)
Continue on pravastatin. May consider increasing in the future but for now stay at same dose. Watch fried, fatty, processed foods.

## 2017-12-14 DIAGNOSIS — H524 Presbyopia: Secondary | ICD-10-CM | POA: Diagnosis not present

## 2017-12-29 ENCOUNTER — Encounter: Payer: Medicare Other | Attending: Physician Assistant | Admitting: Registered"

## 2017-12-29 ENCOUNTER — Encounter: Payer: Self-pay | Admitting: Registered"

## 2017-12-29 DIAGNOSIS — Z713 Dietary counseling and surveillance: Secondary | ICD-10-CM | POA: Diagnosis not present

## 2017-12-29 DIAGNOSIS — Z6831 Body mass index (BMI) 31.0-31.9, adult: Secondary | ICD-10-CM | POA: Diagnosis not present

## 2017-12-29 DIAGNOSIS — E6609 Other obesity due to excess calories: Secondary | ICD-10-CM | POA: Insufficient documentation

## 2017-12-29 DIAGNOSIS — E119 Type 2 diabetes mellitus without complications: Secondary | ICD-10-CM | POA: Diagnosis not present

## 2017-12-29 NOTE — Patient Instructions (Signed)
Goals:  Follow Diabetes Meal Plan as instructed  Eat 3 meals and 2 snacks, every 3-5 hrs  Limit carbohydrate intake to 45-60 grams carbohydrate/meal  Limit carbohydrate intake to 15 grams carbohydrate/snack  Add lean protein foods to meals/snacks  Monitor glucose levels as instructed by your doctor  Aim for 30 mins of physical activity daily  Bring food record and glucose log to your next nutrition visit  Aim to eat carbohydrate with protein as snacks  Check blood sugar 3-4 times a day, fasting and 2 hours after meals.
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
## 2018-01-07 ENCOUNTER — Other Ambulatory Visit: Payer: Self-pay

## 2018-01-07 ENCOUNTER — Telehealth: Payer: Self-pay

## 2018-01-07 DIAGNOSIS — E119 Type 2 diabetes mellitus without complications: Secondary | ICD-10-CM

## 2018-01-07 NOTE — Telephone Encounter (Signed)
Went to see diabetic counslor and was given contour next machine to test sugars it came with 10 strips and she wanted to know if she has to buy strips and needles OTC or if you can write a prescription for them. -[place]

## 2018-01-10 NOTE — Telephone Encounter (Signed)
Please send her rx for strips, needles etc. What ever she needs.

## 2018-01-11 MED ORDER — MICROLET LANCETS MISC: 5 refills | Status: DC

## 2018-01-11 MED ORDER — GLUCOSE BLOOD VI STRP: ORAL_STRIP | 5 refills | Status: DC

## 2018-01-11 NOTE — Telephone Encounter (Signed)
Called pt- she tests twice daily.  Pt needs RX for contour next strips and she state she is using microlet lancets.   RX has been sent to local pharmacy per pt request

## 2018-01-14 ENCOUNTER — Other Ambulatory Visit: Payer: Self-pay

## 2018-01-14 DIAGNOSIS — E119 Type 2 diabetes mellitus without complications: Secondary | ICD-10-CM

## 2018-01-14 MED ORDER — ONETOUCH VERIO FLEX SYSTEM W/DEVICE KIT: PACK | 99 refills | Status: DC

## 2018-01-14 MED ORDER — LANCETS 28G MISC
99 refills | Status: DC
Start: 2018-01-14 — End: 2018-03-07

## 2018-01-14 MED ORDER — GLUCOSE BLOOD VI STRP: ORAL_STRIP | 12 refills | Status: DC

## 2018-01-18 MED ORDER — "PEN NEEDLES 3/16"" 31G X 5 MM MISC": 10 refills | Status: DC

## 2018-01-18 NOTE — Addendum Note (Signed)
Addended by: Dessie Coma on: 01/18/2018 01:51 PM   Modules accepted: Orders

## 2018-02-09 ENCOUNTER — Ambulatory Visit: Payer: Medicare Other | Admitting: Registered"

## 2018-02-09 DIAGNOSIS — L700 Acne vulgaris: Secondary | ICD-10-CM | POA: Diagnosis not present

## 2018-02-10 ENCOUNTER — Telehealth: Payer: Self-pay

## 2018-02-10 NOTE — Telephone Encounter (Signed)
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   Pt states paperwork must be done by 02-20-18, and her PCP is J[facility]. Since Luvenia Starch is out of office and she need a provider to look over her form/do an exam, I have put her on [facility] scheduled for 02-16-18.  Evlyn Clines, is this okay with you to see pt while PCP out of office so that a proviedr can complete and sign off on paperwork?  Please advise. Thanks!

## 2018-02-10 NOTE — Telephone Encounter (Signed)
That works!

## 2018-02-14 ENCOUNTER — Ambulatory Visit: Payer: Medicare Other

## 2018-02-16 ENCOUNTER — Ambulatory Visit (INDEPENDENT_AMBULATORY_CARE_PROVIDER_SITE_OTHER): Payer: Medicare Other | Admitting: Physician Assistant

## 2018-02-16 ENCOUNTER — Encounter: Payer: Self-pay | Admitting: Physician Assistant

## 2018-02-16 VITALS — BP 106/72 | HR 84 | Wt 161.0 lb

## 2018-02-16 DIAGNOSIS — Z0000 Encounter for general adult medical examination without abnormal findings: Secondary | ICD-10-CM | POA: Diagnosis not present

## 2018-02-16 DIAGNOSIS — Z111 Encounter for screening for respiratory tuberculosis: Secondary | ICD-10-CM

## 2018-02-16 DIAGNOSIS — E119 Type 2 diabetes mellitus without complications: Secondary | ICD-10-CM | POA: Diagnosis not present

## 2018-02-16 NOTE — Progress Notes (Signed)
HPI:                                                                Bonnie Torres is a 62 y.o. female who presents to [facility]: [place] today for health exam for employment with [employer]  No current health concerns   No flowsheet data found.    Past Medical History:  Diagnosis Date  . Gout    1 yr ago right great toe  . Heart rate fast    tx Metoprolol  . Hyperlipidemia   . Hypertension   . 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 Type 2 diabetes mellitus without complication, without long-term current use of insulin ([place]) 12/03/2017   Past Surgical History:  Procedure Laterality Date  . ABDOMINAL HYSTERECTOMY N/A 06/18/2016   Procedure: HYSTERECTOMY ABDOMINAL TOTAL;  Surgeon: Everitt Amber, MD;  Location: [facility];  Service: Gynecology;  Laterality: N/A;  . BREAST EXCISIONAL BIOPSY Right   . BREAST SURGERY Right    breast biopsy-benign  . MASS EXCISION N/A 06/18/2016   Procedure: RESECTION RETROPERITONEAL MASS WITH RETORPERITONEAL EXPLORATION ,LIGATION CHYLI CISTERNA,OPEN  CHOLECYSTECTOMY WITH MOBILATION HEPATIC FLEXURE;  Surgeon: Hall Busing, MD;  Location: [facility];  Service: General;  Laterality: N/A;  . PARTIAL NEPHRECTOMY Right 06/18/2016   Procedure: OPEN RIGHT NEPHRECTOMY PARTIAL;  Surgeon: Raynelle Bring, MD;  [facility];  Service: Urology;  Laterality: Right;  . SALPINGOOPHORECTOMY Bilateral 06/18/2016   Procedure: BILATERAL SALPINGO OOPHORECTOMY;  Surgeon: Everitt Amber, MD;  Location[facility];  Service: Gynecology;  Laterality: Bilateral;  . SHOULDER SURGERY Right 2010  . TOE SURGERY Right Hammer Toe   2004  . TONSILLECTOMY     Social History   Tobacco Use  . Smoking status: Never Smoker  . Smokeless tobacco: Never Used  Substance Use Topics  . Alcohol use: No    Alcohol/week: 0.0 standard drinks   family history includes Cancer in her paternal aunt and sister; Diabetes in her maternal aunt;  Heart attack in her paternal aunt; Hypertension in her mother.    ROS: negative except as noted in the HPI  Medications: Current Outpatient Medications  Medication Sig Dispense Refill  . Blood Glucose Monitoring Suppl ([place] FLEX SYSTEM) w/Device KIT Check fasting blood sugar every morning. DM ICD10 E11.9 1 kit Prn  . calcium-vitamin D (OSCAL WITH D) 250-125 MG-UNIT tablet Take 1 tablet by mouth daily.    . colchicine 0.6 MG tablet Take 1 tablet by mouth  daily 90 tablet 0  . glucose blood (CONTOUR NEXT TEST) test strip Use to test blood sugars twice daily 200 each 5  . glucose blood test strip Check fasting blood sugar every morning. DM ICD10 E11.9 100 each 12  . Insulin Pen Needle (PEN NEEDLES 3/16") 31G X 5 MM MISC Use needles to inject insulin. 100 each 10  . Lancets 28G MISC Check fasting blood sugar every morning. DM ICD10 E11.9 100 each prn  . meloxicam (MOBIC) 15 MG tablet Take 1 tablet (15 mg total) by mouth daily. As needed for back pain 90 tablet 1  . metFORMIN (GLUCOPHAGE XR) 750 MG 24 hr tablet Take 1 tablet (750 mg total) by mouth daily with breakfast. 30 tablet 2  .  metoprolol succinate (TOPROL-XL) 100 MG 24 hr tablet TAKE 1 TABLET BY MOUTH DAILY. TAKE WITH OR IMMEDIATELY FOLLOWING A MEAL 180 tablet 1  . MICROLET LANCETS MISC Use to check blood sugars twice daily 200 each 5  . Olmesartan-amLODIPine-HCTZ 20-5-12.5 MG TABS Take 1 tablet by mouth daily. 90 tablet 1  . potassium chloride SA (K-DUR,KLOR-CON) 20 MEQ tablet TAKE 1 TABLET(20 MEQ) BY MOUTH DAILY 90 tablet 1  . pravastatin (PRAVACHOL) 40 MG tablet Take one tablet by mouth daily. 90 tablet 4  . spironolactone (ALDACTONE) 50 MG tablet Take 50 mg by mouth daily.    . traZODone (DESYREL) 50 MG tablet TAKE 1 TO 2 TABLETS BY MOUTH AT BEDTIME FOR SLEEP 180 tablet 1  . tretinoin (RETIN-A) 0.025 % cream Apply topically at bedtime.     No current facility-administered medications for this visit.    No Known  Allergies     Objective:  BP 106/72   Pulse 84   Wt 161 lb (73 kg)   BMI 32.52 kg/m  Gen:  alert, not ill-appearing, no distress, appropriate for age 61: head normocephalic without obvious abnormality, conjunctiva and cornea clear, wearing glasses, trachea midline Pulm: Normal work of breathing, normal phonation, clear to auscultation bilaterally, no wheezes, rales or rhonchi CV: Normal rate, regular rhythm, s1 and s2 distinct, no murmurs, clicks or rubs  Neuro: alert and oriented x 3, no tremor MSK: extremities atraumatic, normal gait and station Skin: intact, no rashes on exposed skin, no jaundice, no cyanosis Psych: well-groomed, cooperative, good eye contact, euthymic mood, affect mood-congruent, speech is articulate, and thought processes clear and goal-directed    No results found for this or any previous visit (from the past 72 hour(s)). No results found.   Diabetic Foot Exam - Simple   Simple Foot Form Diabetic Foot exam was performed with the following findings:  Yes 02/16/2018 11:16 AM  Visual Inspection No deformities, no ulcerations, no other skin breakdown bilaterally:  Yes See comments:  Yes Sensation Testing Intact to touch and monofilament testing bilaterally:  Yes Pulse Check Posterior Tibialis and Dorsalis pulse intact bilaterally:  Yes Comments Onychomycosis of all nails bilaterally     Assessment and Plan: 62 y.o. female with   .Diagnoses and all orders for this visit:  Encounter for general health examination  Encounter for PPD test -     TB Skin Test  Encounter for diabetic foot exam ([place])   - physical exam performed in office today - form completed, signed and returned to patient - PPD placed - return in 2 days for nurse PPD read    Patient education and anticipatory guidance given Patient agrees with treatment plan Follow-up as needed  Darlyne Russian PA-C

## 2018-02-16 NOTE — Patient Instructions (Signed)
Tuberculin Skin Test  Why am I having this test?  Tuberculosis (TB) is a bacterial infection caused by Mycobacterium tuberculosis. Most people who are exposed to these bacteria have a strong enough defense (immune) system to prevent the bacteria from causing TB and developing symptoms. Their bodies prevent the germs from being active and making them sick (latent TB infection).  However, if you have TB germs in your body and your immune system is weak, you can develop a TB infection. This can cause symptoms such as:  · Night sweats.  · Fever.  · Weakness.  · Weight loss.    A latent TB infection can also become active later in life if your immune system becomes weakened or compromised.  You may have this test if your health care provider suspects that you have TB. You may also have this test to screen for TB if you are at risk for getting the disease. Those at increased risk include:  · People who inject illegal drugs or share needles.  · People with HIV or other diseases that affect immunity.  · Health care workers.  · People who live in high-risk communities, such as homeless shelters, nursing homes, and correctional facilities.  · People who have been in contact with someone with TB.  · People from countries where TB is more common.    If you are in a high-risk group, your health care provider may wish to screen for TB more often. This can help prevent the spread of the disease. Sometimes TB screening is required when starting a new job, such as becoming a health care worker or a teacher. Colleges or universities may require it of new students.  What is being tested?  A tuberculin skin test is the main test used to check for exposure to the bacteria that can cause TB. The test checks for antibodies to the bacteria. Antibodies are proteins that your body produces to protect you from germs and other things that can make you sick.  Your health care provider will inject a solution known as PPD (purified  protein derivative) under the first layer of skin on your arm. This causes a blister-like bubble to form at the site. Your health care provider will then examine the site after a number of hours have passed to see if a reaction has occurred.  How do I prepare for this test?  There is no preparation required for this test.  What do the results mean?  Your test results will be reported as either negative or positive.  If the tuberculin skin test produces a negative result, it is likely that you do not have TB and have not been exposed to the TB bacteria.  If you or your health care provider suspects exposure, however, you may want to repeat the test a few weeks later. A blood test may also be used to check for TB. This is because you will not react to the tuberculin skin test until several weeks after exposure to TB bacteria.  If you test positive to the tuberculin skin test, it is likely that you have been exposed to TB bacteria. The test does not distinguish between an active and a latent TB infection.  A false-positive result can occur. A false-positive result for TB bacteria is incorrect because it indicates a condition or finding is present when it is not.  Talk to your health care provider to discuss your results, treatment options, and if necessary, the need for more tests.    It is your responsibility to obtain your test results. Ask the lab or department performing the test when and how you will get your results. Talk with your health care provider if you have any questions about your results.  Talk with your health care provider to discuss your results, treatment options, and if necessary, the need for more tests. Talk with your health care provider if you have any questions about your results.  This information is not intended to replace advice given to you by your health care provider. Make sure you discuss any questions you have with your health care provider.   Document Released: 03/25/2005 Document Revised: 02/16/2016 Document Reviewed: 10/09/2013  Elsevier Interactive Patient Education © 2018 Elsevier Inc[employer]

## 2018-02-16 NOTE — Progress Notes (Deleted)
106/72 

## 2018-02-18 ENCOUNTER — Ambulatory Visit (INDEPENDENT_AMBULATORY_CARE_PROVIDER_SITE_OTHER): Payer: Medicare Other | Admitting: Physician Assistant

## 2018-02-18 VITALS — BP 109/57 | HR 83 | Wt 159.0 lb

## 2018-02-18 DIAGNOSIS — Z111 Encounter for screening for respiratory tuberculosis: Secondary | ICD-10-CM

## 2018-02-18 LAB — TB SKIN TEST
INDURATION: 2 mm
TB SKIN TEST: NEGATIVE

## 2018-02-18 NOTE — Progress Notes (Signed)
   Subjective:    Patient ID: Bonnie Torres, female    DOB: 1956-01-17, 62 y.o.   MRN: [id number]  HPI  Bonnie Torres is here for a PPD read. Denies any problems with injection.   Review of Systems     Objective:   Physical Exam        Assessment & Plan:  TB screening - Negative with 2 mm.

## 2018-03-04 ENCOUNTER — Ambulatory Visit: Payer: Medicare Other | Admitting: Physician Assistant

## 2018-03-07 ENCOUNTER — Ambulatory Visit (INDEPENDENT_AMBULATORY_CARE_PROVIDER_SITE_OTHER): Payer: Medicare Other | Admitting: Physician Assistant

## 2018-03-07 ENCOUNTER — Encounter: Payer: Self-pay | Admitting: Physician Assistant

## 2018-03-07 VITALS — BP 102/56 | HR 93 | Ht 59.0 in | Wt 160.0 lb

## 2018-03-07 DIAGNOSIS — E119 Type 2 diabetes mellitus without complications: Secondary | ICD-10-CM | POA: Diagnosis not present

## 2018-03-07 DIAGNOSIS — Z23 Encounter for immunization: Secondary | ICD-10-CM

## 2018-03-07 DIAGNOSIS — I10 Essential (primary) hypertension: Secondary | ICD-10-CM

## 2018-03-07 LAB — POCT GLYCOSYLATED HEMOGLOBIN (HGB A1C): Hemoglobin A1C: 6 % — AB (ref 4.0–5.6)

## 2018-03-07 MED ORDER — METFORMIN HCL ER 750 MG PO TB24: 750.0000 mg | ORAL_TABLET | Freq: Every day | ORAL | 2 refills | Status: DC

## 2018-03-07 NOTE — Progress Notes (Signed)
   Subjective:    Patient ID: Bonnie Torres, female    DOB: June 27, 1956, 62 y.o.   MRN: [id number]  HPI  Pt is a 62 yo female with HTN, T2DM who presents for 3 month follow upon DM.   DM- pt is checking sugars and ranging from 60's to 105's in the morning. She denies any hypoglycemic symptoms. She is only taking metformin. She has no open sores or wounds. She denies any numbness and/or tingling of extremities.she did have some diabetic education which seemed to help some.   HTN- pt denies any CP, palpitations, headaches, vision changes. Taking medication as prescribed.   Per pt not taking mobic.   .. Active Ambulatory Problems    Diagnosis Date Noted  . Insomnia 08/31/2014  . Essential hypertension, benign 08/31/2014  . Hyperlipidemia 09/28/2014  . Bilateral low back pain without sciatica 04/17/2015  . Primary gout 10/16/2015  . Breast calcification, right 11/13/2015  . Elevated testosterone level in female 03/25/2016  . Mass of kidney 04/05/2016  . Uterine mass 04/27/2016  . Lymphangioma 06/18/2016  . Hot flashes due to surgical menopause 08/19/2016  . Obesity (BMI 30.0-34.9) 09/16/2016  . Hypokalemia 04/02/2017  . Anterior cervical adenopathy 05/19/2017  . Trigger middle finger of left hand 12/01/2017  . Type 2 diabetes mellitus without complication, without long-term current use of insulin ([place]) 12/03/2017  . Class 1 obesity due to excess calories without serious comorbidity with body mass index (BMI) of 33.0 to 33.9 in adult 12/03/2017  . Elevated serum creatinine 12/07/2017   Resolved Ambulatory Problems    Diagnosis Date Noted  . Mass of omentum 04/05/2016  . Hirsutism 04/05/2016  . Mass of ovary 04/05/2016  . Renal cell carcinoma ([place]) 08/18/2016   Past Medical History:  Diagnosis Date  . Gout   . Heart rate fast   . Hypertension   . 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     Objective:   Physical Exam  Constitutional:  She is oriented to person, place, and time. She appears well-developed and well-nourished.  HENT:  Head: Normocephalic and atraumatic.  Cardiovascular: Normal rate and regular rhythm.  Pulmonary/Chest: Effort normal and breath sounds normal.  Neurological: She is alert and oriented to person, place, and time.  Psychiatric: She has a normal mood and affect. Her behavior is normal.          Assessment & Plan:  Marland KitchenMarland KitchenDiagnoses and all orders for this visit:  Type 2 diabetes mellitus without complication, without long-term current use of insulin ([facility]) -     POCT glycosylated hemoglobin (Hb A1C) -     COMPLETE METABOLIC PANEL WITH GFR -     metFORMIN (GLUCOPHAGE XR) 750 MG 24 hr tablet; Take 1 tablet (750 mg total) by mouth daily with breakfast.  Essential hypertension, benign    Lab Results  Component Value Date   HGBA1C 6.0 (A) 03/07/2018   A!C looks great.  Continue on metformin.  On STATIN/ARB. Eye exam up to date.  Foot exam up to date.  Pneumonia vaccine up to date.  Flu shot given today.  Follow up in 3 months.   BP looks good. A little on the low side today. Watch for signs of hypotension and we may need to decrease medications.

## 2018-03-08 ENCOUNTER — Encounter: Payer: Self-pay | Admitting: Physician Assistant

## 2018-03-09 DIAGNOSIS — E119 Type 2 diabetes mellitus without complications: Secondary | ICD-10-CM | POA: Diagnosis not present

## 2018-03-10 LAB — COMPLETE METABOLIC PANEL WITH GFR
AG Ratio: 1.2 (calc) (ref 1.0–2.5)
ALBUMIN MSPROF: 4.2 g/dL (ref 3.6–5.1)
ALKALINE PHOSPHATASE (APISO): 72 U/L (ref 33–130)
ALT: 9 U/L (ref 6–29)
AST: 16 U/L (ref 10–35)
BILIRUBIN TOTAL: 0.4 mg/dL (ref 0.2–1.2)
BUN / CREAT RATIO: 16 (calc) (ref 6–22)
BUN: 27 mg/dL — AB (ref 7–25)
CALCIUM: 9.9 mg/dL (ref 8.6–10.4)
CHLORIDE: 102 mmol/L (ref 98–110)
CO2: 22 mmol/L (ref 20–32)
Creat: 1.68 mg/dL — ABNORMAL HIGH (ref 0.50–0.99)
GFR, Est African American: 38 mL/min/{1.73_m2} — ABNORMAL LOW (ref >=60)
GFR, Est Non African American: 33 mL/min/{1.73_m2} — ABNORMAL LOW (ref >=60)
GLUCOSE: 93 mg/dL (ref 65–139)
Globulin: 3.4 g/dL (calc) (ref 1.9–3.7)
Potassium: 4.9 mmol/L (ref 3.5–5.3)
SODIUM: 136 mmol/L (ref 135–146)
Total Protein: 7.6 g/dL (ref 6.1–8.1)

## 2018-03-11 NOTE — Progress Notes (Signed)
Call pt: kidney function is up a little more than last recheck. Confirm and reassure not taking any NSAIDs. Make sure staying hydrated. Will recheck in 1 month lab only.

## 2018-03-18 NOTE — Progress Notes (Deleted)
Subjective:   Bonnie Torres is a 62 y.o. female who presents for Medicare Annual (Subsequent) preventive examination.  Review of Systems:  No ROS.  Medicare Wellness Visit. Additional risk factors are reflected in the social history.    Sleep patterns: .   Home Safety/Smoke Alarms: Feels safe in home. Smoke alarms in place.  Living environment;  Female:   Pap-       Mammo- utd      Dexa scan- not eligble       CCS-utd Eye exam- utd      Objective:     Vitals: There were no vitals taken for this visit.  There is no height or weight on file to calculate BMI.  Advanced Directives 12/29/2017 06/18/2016 06/12/2016 05/19/2016 04/30/2016 04/13/2016 08/31/2014  Does Patient Have a Medical Advance Directive? No No No No No No No  Would patient like information on creating a medical advance directive? No - Patient declined No - Patient declined No - Patient declined - No - patient declined information No - patient declined information Yes - Scientist, clinical (histocompatibility and immunogenetics) given    Tobacco Social History   Tobacco Use  Smoking Status Never Smoker  Smokeless Tobacco Never Used     Counseling given: Not Answered   Clinical Intake:                       Past Medical History:  Diagnosis Date  . Gout    1 yr ago right great toe  . Heart rate fast    tx Metoprolol  . Hyperlipidemia   . Hypertension   . 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 Type 2 diabetes mellitus without complication, without long-term current use of insulin ([place]) 12/03/2017   Past Surgical History:  Procedure Laterality Date  . ABDOMINAL HYSTERECTOMY N/A 06/18/2016   Procedure: HYSTERECTOMY ABDOMINAL TOTAL;  Surgeon: Everitt Amber, MD;  [facility];  Service: Gynecology;  Laterality: N/A;  . BREAST EXCISIONAL BIOPSY Right   . BREAST SURGERY Right    breast biopsy-benign  . MASS EXCISION N/A 06/18/2016   Procedure: RESECTION RETROPERITONEAL MASS WITH RETORPERITONEAL EXPLORATION ,LIGATION  CHYLI CISTERNA,OPEN  CHOLECYSTECTOMY WITH MOBILATION HEPATIC FLEXURE;  Surgeon: Hall Busing, MD;  Location[facility];  Service: General;  Laterality: N/A;  . PARTIAL NEPHRECTOMY Right 06/18/2016   Procedure: OPEN RIGHT NEPHRECTOMY PARTIAL;  Surgeon: Raynelle Bring, MD;  [facility];  Service: Urology;  Laterality: Right;  . SALPINGOOPHORECTOMY Bilateral 06/18/2016   Procedure: BILATERAL SALPINGO OOPHORECTOMY;  Surgeon: Everitt Amber, MD;  Location[facility];  Service: Gynecology;  Laterality: Bilateral;  . SHOULDER SURGERY Right 2010  . TOE SURGERY Right Hammer Toe   2004  . TONSILLECTOMY     Family History  Problem Relation Age of Onset  . Heart attack Paternal Aunt   . Hypertension Mother   . Diabetes Maternal Aunt   . Cancer Paternal Aunt        stomach  . Cancer Sister    Social History   Socioeconomic History  . Marital status: Widowed    Spouse name: Not on file  . Number of children: Not on file  . Years of education: Not on file  . Highest education level: Not on file  Occupational History  . Not on file  Social Needs  . Financial resource strain: Not on file  . Food insecurity:    Worry: Not on file    Inability: Not on file  .  Transportation needs:    Medical: Not on file    Non-medical: Not on file  Tobacco Use  . Smoking status: Never Smoker  . Smokeless tobacco: Never Used  Substance and Sexual Activity  . Alcohol use: No    Alcohol/week: 0.0 standard drinks  . Drug use: No  . Sexual activity: Not Currently  Lifestyle  . Physical activity:    Days per week: Not on file    Minutes per session: Not on file  . Stress: Not on file  Relationships  . Social connections:    Talks on phone: Not on file    Gets together: Not on file    Attends religious service: Not on file    Active member of club or organization: Not on file    Attends meetings of clubs or organizations: Not on file    Relationship status: Not on file  Other Topics Concern  . Not on  file  Social History Narrative  . Not on file    Outpatient Encounter Medications as of 03/29/2018  Medication Sig  . Blood Glucose Monitoring Suppl ([place] FLEX SYSTEM) w/Device KIT Check fasting blood sugar every morning. DM ICD10 E11.9  . calcium-vitamin D (OSCAL WITH D) 250-125 MG-UNIT tablet Take 1 tablet by mouth daily.  . colchicine 0.6 MG tablet Take 1 tablet by mouth  daily  . glucose blood (CONTOUR NEXT TEST) test strip Use to test blood sugars twice daily  . glucose blood test strip Check fasting blood sugar every morning. DM ICD10 E11.9  . metFORMIN (GLUCOPHAGE XR) 750 MG 24 hr tablet Take 1 tablet (750 mg total) by mouth daily with breakfast.  . metoprolol succinate (TOPROL-XL) 100 MG 24 hr tablet TAKE 1 TABLET BY MOUTH DAILY. TAKE WITH OR IMMEDIATELY FOLLOWING A MEAL  . MICROLET LANCETS MISC Use to check blood sugars twice daily  . Olmesartan-amLODIPine-HCTZ 20-5-12.5 MG TABS Take 1 tablet by mouth daily.  . potassium chloride SA (K-DUR,KLOR-CON) 20 MEQ tablet TAKE 1 TABLET(20 MEQ) BY MOUTH DAILY  . pravastatin (PRAVACHOL) 40 MG tablet Take one tablet by mouth daily.  [name] spironolactone (ALDACTONE) 50 MG tablet Take 50 mg by mouth daily.  . traZODone (DESYREL) 50 MG tablet TAKE 1 TO 2 TABLETS BY MOUTH AT BEDTIME FOR SLEEP  . tretinoin (RETIN-A) 0.025 % cream Apply topically at bedtime.   No facility-administered encounter medications on file as of 03/29/2018.     Activities of Daily Living In your present state of health, do you have any difficulty performing the following activities: 03/07/2018  Hearing? N  Vision? N  Difficulty concentrating or making decisions? N  Walking or climbing stairs? N  Dressing or bathing? N  Doing errands, shopping? N  Some recent data might be hidden    Patient Care Team: Lavada Mesi as PCP - General (Family Medicine)    Assessment:   This is a routine wellness examination for Tamora. Physical assessment deferred to  PCP.   Exercise Activities and Dietary recommendations   Diet  Breakfast: Lunch:  Dinner:       Goals   None     Fall Risk Fall Risk  12/29/2017 04/13/2016  Falls in the past year? No No   Is the patient's home free of loose throw rugs in walkways, pet beds, electrical cords, etc?   {Blank single:19197::"yes","no"}      Grab bars in the bathroom? {Blank single:19197::"yes","no"}      Handrails on the stairs?   {  Blank single:19197::"yes","no"}      Adequate lighting?   {Blank single:19197::"yes","no"}  Depression Screen PHQ 2/9 Scores 12/29/2017 12/01/2017 03/31/2017 11/16/2016  PHQ - 2 Score 0 0 0 0  PHQ- 9 Score - - - 0     Cognitive Function        Immunization History  Administered Date(s) Administered  . Influenza,inj,Quad PF,6+ Mos 04/15/2015, 03/23/2016, 03/31/2017, 03/07/2018  . Influenza-Unspecified 04/28/2011, 05/29/2013  . PPD Test 02/16/2018  . Pneumococcal Polysaccharide-23 12/01/2017  . Tdap 12/09/2010, 10/12/2014    Screening Tests Health Maintenance  Topic Date Due  . HEMOGLOBIN A1C  09/05/2018  . COLON CANCER SCREENING ANNUAL FOBT  11/24/2018  . OPHTHALMOLOGY EXAM  11/26/2018  . FOOT EXAM  02/17/2019  . MAMMOGRAM  05/28/2019  . Fecal DNA (Cologuard)  04/13/2020  . TETANUS/TDAP  10/11/2024  . INFLUENZA VACCINE  Completed  . PNEUMOCOCCAL POLYSACCHARIDE VACCINE AGE 28-64 HIGH RISK  Completed  . Hepatitis C Screening  Completed  . HIV Screening  Completed       Plan:   ***   I have personally reviewed and noted the following in the patient's chart:   . Medical and social history . Use of alcohol, tobacco or illicit drugs  . Current medications and supplements . Functional ability and status . Nutritional status . Physical activity . Advanced directives . List of other physicians . Hospitalizations, surgeries, and ER visits in previous 12 months . Vitals . Screenings to include cognitive, depression, and falls . Referrals and  appointments  In addition, I have reviewed and discussed with patient certain preventive protocols, quality metrics, and best practice recommendations. A written personalized care plan for preventive services as well as general preventive health recommendations were provided to patient.     Joanne Chars, LPN  7/41/2878

## 2018-03-22 DIAGNOSIS — C641 Malignant neoplasm of right kidney, except renal pelvis: Secondary | ICD-10-CM | POA: Diagnosis not present

## 2018-03-24 DIAGNOSIS — K449 Diaphragmatic hernia without obstruction or gangrene: Secondary | ICD-10-CM | POA: Diagnosis not present

## 2018-03-24 DIAGNOSIS — N200 Calculus of kidney: Secondary | ICD-10-CM | POA: Diagnosis not present

## 2018-03-24 DIAGNOSIS — C649 Malignant neoplasm of unspecified kidney, except renal pelvis: Secondary | ICD-10-CM | POA: Diagnosis not present

## 2018-03-24 DIAGNOSIS — C641 Malignant neoplasm of right kidney, except renal pelvis: Secondary | ICD-10-CM | POA: Diagnosis not present

## 2018-03-29 ENCOUNTER — Ambulatory Visit: Payer: Medicare Other

## 2018-03-29 DIAGNOSIS — Z85528 Personal history of other malignant neoplasm of kidney: Secondary | ICD-10-CM | POA: Diagnosis not present

## 2018-03-29 DIAGNOSIS — D49512 Neoplasm of unspecified behavior of left kidney: Secondary | ICD-10-CM | POA: Diagnosis not present

## 2018-04-02 ENCOUNTER — Other Ambulatory Visit: Payer: Self-pay | Admitting: Hematology & Oncology

## 2018-04-02 DIAGNOSIS — R1901 Right upper quadrant abdominal swelling, mass and lump: Secondary | ICD-10-CM

## 2018-04-02 DIAGNOSIS — E876 Hypokalemia: Secondary | ICD-10-CM

## 2018-04-04 ENCOUNTER — Ambulatory Visit (INDEPENDENT_AMBULATORY_CARE_PROVIDER_SITE_OTHER): Payer: Medicare Other | Admitting: *Deleted

## 2018-04-04 VITALS — BP 112/66 | HR 62 | Ht 59.0 in | Wt 158.0 lb

## 2018-04-04 DIAGNOSIS — Z0000 Encounter for general adult medical examination without abnormal findings: Secondary | ICD-10-CM | POA: Diagnosis not present

## 2018-04-04 NOTE — Patient Instructions (Signed)
Health Maintenance for Postmenopausal Women Menopause is a normal process in which your reproductive ability comes to an end. This process happens gradually over a span of months to years, usually between the ages of 22 and 9. Menopause is complete when you have missed 12 consecutive menstrual periods. It is important to talk with your health care provider about some of the most common conditions that affect postmenopausal women, such as heart disease, cancer, and bone loss (osteoporosis). Adopting a healthy lifestyle and getting preventive care can help to promote your health and wellness. Those actions can also lower your chances of developing some of these common conditions. What should I know about menopause? During menopause, you may experience a number of symptoms, such as:  Moderate-to-severe hot flashes.  Night sweats.  Decrease in sex drive.  Mood swings.  Headaches.  Tiredness.  Irritability.  Memory problems.  Insomnia.  Choosing to treat or not to treat menopausal changes is an individual decision that you make with your health care provider. What should I know about hormone replacement therapy and supplements? Hormone therapy products are effective for treating symptoms that are associated with menopause, such as hot flashes and night sweats. Hormone replacement carries certain risks, especially as you become older. If you are thinking about using estrogen or estrogen with progestin treatments, discuss the benefits and risks with your health care provider. What should I know about heart disease and stroke? Heart disease, heart attack, and stroke become more likely as you age. This may be due, in part, to the hormonal changes that your body experiences during menopause. These can affect how your body processes dietary fats, triglycerides, and cholesterol. Heart attack and stroke are both medical emergencies. There are many things that you can do to help prevent heart disease  and stroke:  Have your blood pressure checked at least every 1-2 years. High blood pressure causes heart disease and increases the risk of stroke.  If you are 60-22 years old, ask your health care provider if you should take aspirin to prevent a heart attack or a stroke.  Do not use any tobacco products, including cigarettes, chewing tobacco, or electronic cigarettes. If you need help quitting, ask your health care provider.  It is important to eat a healthy diet and maintain a healthy weight. ? Be sure to include plenty of vegetables, fruits, low-fat dairy products, and lean protein. ? Avoid eating foods that are high in solid fats, added sugars, or salt (sodium).  Get regular exercise. This is one of the most important things that you can do for your health. ? Try to exercise for at least 150 minutes each week. The type of exercise that you do should increase your heart rate and make you sweat. This is known as moderate-intensity exercise. ? Try to do strengthening exercises at least twice each week. Do these in addition to the moderate-intensity exercise.  Know your numbers.Ask your health care provider to check your cholesterol and your blood glucose. Continue to have your blood tested as directed by your health care provider.  What should I know about cancer screening? There are several types of cancer. Take the following steps to reduce your risk and to catch any cancer development as early as possible. Breast Cancer  Practice breast self-awareness. ? This means understanding how your breasts normally appear and feel. ? It also means doing regular breast self-exams. Let your health care provider know about any changes, no matter how small.  If you are 60  or older, have a clinician do a breast exam (clinical breast exam or CBE) every year. Depending on your age, family history, and medical history, it may be recommended that you also have a yearly breast X-ray (mammogram).  If you  have a family history of breast cancer, talk with your health care provider about genetic screening.  If you are at high risk for breast cancer, talk with your health care provider about having an MRI and a mammogram every year.  Breast cancer (BRCA) gene test is recommended for women who have family members with BRCA-related cancers. Results of the assessment will determine the need for genetic counseling and BRCA1 and for BRCA2 testing. BRCA-related cancers include these types: ? Breast. This occurs in males or females. ? Ovarian. ? Tubal. This may also be called fallopian tube cancer. ? Cancer of the abdominal or pelvic lining (peritoneal cancer). ? Prostate. ? Pancreatic.  Cervical, Uterine, and Ovarian Cancer Your health care provider may recommend that you be screened regularly for cancer of the pelvic organs. These include your ovaries, uterus, and vagina. This screening involves a pelvic exam, which includes checking for microscopic changes to the surface of your cervix (Pap test).  For women ages 60-65, health care providers may recommend a pelvic exam and a Pap test every three years. For women ages 60-65, they may recommend the Pap test and pelvic exam, combined with testing for human papilloma virus (HPV), every five years. Some types of HPV increase your risk of cervical cancer. Testing for HPV may also be done on women of any age who have unclear Pap test results.  Other health care providers may not recommend any screening for nonpregnant women who are considered low risk for pelvic cancer and have no symptoms. Ask your health care provider if a screening pelvic exam is right for you.  If you have had past treatment for cervical cancer or a condition that could lead to cancer, you need Pap tests and screening for cancer for at least 20 years after your treatment. If Pap tests have been discontinued for you, your risk factors (such as having a new sexual partner) need to be  reassessed to determine if you should start having screenings again. Some women have medical problems that increase the chance of getting cervical cancer. In these cases, your health care provider may recommend that you have screening and Pap tests more often.  If you have a family history of uterine cancer or ovarian cancer, talk with your health care provider about genetic screening.  If you have vaginal bleeding after reaching menopause, tell your health care provider.  There are currently no reliable tests available to screen for ovarian cancer.  Lung Cancer Lung cancer screening is recommended for adults 69-62 years old who are at high risk for lung cancer because of a history of smoking. A yearly low-dose CT scan of the lungs is recommended if you:  Currently smoke.  Have a history of at least 30 pack-years of smoking and you currently smoke or have quit within the past 15 years. A pack-year is smoking an average of one pack of cigarettes per day for one year.  Yearly screening should:  Continue until it has been 15 years since you quit.  Stop if you develop a health problem that would prevent you from having lung cancer treatment.  Colorectal Cancer  This type of cancer can be detected and can often be prevented.  Routine colorectal cancer screening usually begins at  age 42 and continues through age 45.  If you have risk factors for colon cancer, your health care provider may recommend that you be screened at an earlier age.  If you have a family history of colorectal cancer, talk with your health care provider about genetic screening.  Your health care provider may also recommend using home test kits to check for hidden blood in your stool.  A small camera at the end of a tube can be used to examine your colon directly (sigmoidoscopy or colonoscopy). This is done to check for the earliest forms of colorectal cancer.  Direct examination of the colon should be repeated every  5-10 years until age 71. However, if early forms of precancerous polyps or small growths are found or if you have a family history or genetic risk for colorectal cancer, you may need to be screened more often.  Skin Cancer  Check your skin from head to toe regularly.  Monitor any moles. Be sure to tell your health care provider: ? About any new moles or changes in moles, especially if there is a change in a mole's shape or color. ? If you have a mole that is larger than the size of a pencil eraser.  If any of your family members has a history of skin cancer, especially at a young age, talk with your health care provider about genetic screening.  Always use sunscreen. Apply sunscreen liberally and repeatedly throughout the day.  Whenever you are outside, protect yourself by wearing long sleeves, pants, a wide-brimmed hat, and sunglasses.  What should I know about osteoporosis? Osteoporosis is a condition in which bone destruction happens more quickly than new bone creation. After menopause, you may be at an increased risk for osteoporosis. To help prevent osteoporosis or the bone fractures that can happen because of osteoporosis, the following is recommended:  If you are 46-71 years old, get at least 1,000 mg of calcium and at least 600 mg of vitamin D per day.  If you are older than age 55 but younger than age 65, get at least 1,200 mg of calcium and at least 600 mg of vitamin D per day.  If you are older than age 54, get at least 1,200 mg of calcium and at least 800 mg of vitamin D per day.  Smoking and excessive alcohol intake increase the risk of osteoporosis. Eat foods that are rich in calcium and vitamin D, and do weight-bearing exercises several times each week as directed by your health care provider. What should I know about how menopause affects my mental health? Depression may occur at any age, but it is more common as you become older. Common symptoms of depression  include:  Low or sad mood.  Changes in sleep patterns.  Changes in appetite or eating patterns.  Feeling an overall lack of motivation or enjoyment of activities that you previously enjoyed.  Frequent crying spells.  Talk with your health care provider if you think that you are experiencing depression. What should I know about immunizations? It is important that you get and maintain your immunizations. These include:  Tetanus, diphtheria, and pertussis (Tdap) booster vaccine.  Influenza every year before the flu season begins.  Pneumonia vaccine.  Shingles vaccine.  Your health care provider may also recommend other immunizations. This information is not intended to replace advice given to you by your health care provider. Make sure you discuss any questions you have with your health care provider. Document Released: 08/07/2005  Document Revised: 01/03/2016 Document Reviewed: 03/19/2015 Elsevier Interactive Patient Education  2018 Elsevier Inc[employer]  

## 2018-04-04 NOTE — Progress Notes (Signed)
Subjective:   Bonnie Torres is a 62 y.o. female who presents for Medicare Annual (Subsequent) preventive examination.  Review of Systems:  No ROS.  Medicare Wellness Visit. Additional risk factors are reflected in the social history. Cardiac Risk Factors include: diabetes mellitus;dyslipidemia;obesity (BMI >30kg/m2) Sleep patterns only sleeping 2-3 hours a night. Will make appointment with PCP to discuss. Home Safety/Smoke Alarms: Feels safe in home. Smoke alarms in place.  Living environment; Lives with daughter in 1 story home no stairs. Shower is a step over with no grab bars.    Female:   Pap- utd      Mammo-   utd    Dexa scan- not due      CCS-utd      Objective:     Vitals: There were no vitals taken for this visit.  There is no height or weight on file to calculate BMI.  Advanced Directives 04/04/2018 12/29/2017 06/18/2016 06/12/2016 05/19/2016 04/30/2016 04/13/2016  Does Patient Have a Medical Advance Directive? _0  No No  Would patient like information on creating a medical advance directive? Yes (MAU/Ambulatory/Procedural Areas - Information given) No - Patient declined No - Patient declined No - Patient declined - No - patient declined information No - patient declined information    Tobacco Social History   Tobacco Use  Smoking Status Never Smoker  Smokeless Tobacco Never Used     Counseling given: Not Answered   Clinical Intake:  Pre-visit preparation completed: Yes  Pain : No/denies pain     Nutritional Risks: None Diabetes: Yes CBG done?: No(took BS this morning and was 112) Did pt. bring in CBG monitor from home?: No  How often do you need to have someone help you when you read instructions, pamphlets, or other written materials from your doctor or pharmacy?: 1 - Never What is the last grade level you completed in school?: 12  Interpreter Needed?: No     Past Medical History:  Diagnosis Date  . Gout    1 yr ago right great  toe  . Heart rate fast    tx Metoprolol  . Hyperlipidemia   . Hypertension   . 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 Type 2 diabetes mellitus without complication, without long-term current use of insulin ([place]) 12/03/2017   Past Surgical History:  Procedure Laterality Date  . ABDOMINAL HYSTERECTOMY N/A 06/18/2016   Procedure: HYSTERECTOMY ABDOMINAL TOTAL;  Surgeon: Everitt Amber, MD;  Location: [facility];  Service: Gynecology;  Laterality: N/A;  . BREAST EXCISIONAL BIOPSY Right   . BREAST SURGERY Right    breast biopsy-benign  . MASS EXCISION N/A 06/18/2016   Procedure: RESECTION RETROPERITONEAL MASS WITH RETORPERITONEAL EXPLORATION ,LIGATION CHYLI CISTERNA,OPEN  CHOLECYSTECTOMY WITH MOBILATION HEPATIC FLEXURE;  Surgeon: Hall Busing, MD;  Location: [facility];  Service: General;  Laterality: N/A;  . PARTIAL NEPHRECTOMY Right 06/18/2016   Procedure: OPEN RIGHT NEPHRECTOMY PARTIAL;  Surgeon: Raynelle Bring, MD;  Location: [facility];  Service: Urology;  Laterality: Right;  . SALPINGOOPHORECTOMY Bilateral 06/18/2016   Procedure: BILATERAL SALPINGO OOPHORECTOMY;  Surgeon: Everitt Amber, MD;  Location: [facility];  Service: Gynecology;  Laterality: Bilateral;  . SHOULDER SURGERY Right 2010  . TOE SURGERY Right Hammer Toe   2004  . TONSILLECTOMY     Family History  Problem Relation Age of Onset  . Heart attack Paternal Aunt   . Hypertension Mother   . Diabetes Maternal Aunt   . Cancer Paternal Aunt  stomach  . Cancer Sister    Social History   Socioeconomic History  . Marital status: Widowed    Spouse name: Not on file  . Number of children: 1  . Years of education: 85  . Highest education level: 12th grade  Occupational History  . 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 Financial resource strain: Not hard at all  . Food insecurity:    Worry: Never true    Inability: Never true  . Transportation needs:    Medical: No    Non-medical: No  Tobacco  Use  . Smoking status: Never Smoker  . Smokeless tobacco: Never Used  Substance and Sexual Activity  . Alcohol use: No    Alcohol/week: 0.0 standard drinks  . Drug use: No  . Sexual activity: Not on file  Lifestyle  . Physical activity:    Days per week: 0 days    Minutes per session: 0 min  . Stress: Not at all  Relationships  . Social connections:    Talks on phone: More than three times a week    Gets together: More than three times a week    Attends religious service: More than 4 times per year    Active member of club or organization: No    Attends meetings of clubs or organizations: Never    Relationship status: Widowed  Other Topics Concern  . Not on file  Social History Narrative  . Not on file    Outpatient Encounter Medications as of 04/04/2018  Medication Sig  . Blood Glucose Monitoring Suppl ([place] FLEX SYSTEM) w/Device KIT Check fasting blood sugar every morning. DM ICD10 E11.9  . calcium-vitamin D (OSCAL WITH D) 250-125 MG-UNIT tablet Take 1 tablet by mouth daily.  [name] glucose blood (CONTOUR NEXT TEST) test strip Use to test blood sugars twice daily  . glucose blood test strip Check fasting blood sugar every morning. DM ICD10 E11.9  . metFORMIN (GLUCOPHAGE XR) 750 MG 24 hr tablet Take 1 tablet (750 mg total) by mouth daily with breakfast.  . metoprolol succinate (TOPROL-XL) 100 MG 24 hr tablet TAKE 1 TABLET BY MOUTH DAILY. TAKE WITH OR IMMEDIATELY FOLLOWING A MEAL  . MICROLET LANCETS MISC Use to check blood sugars twice daily  . Olmesartan-amLODIPine-HCTZ 20-5-12.5 MG TABS Take 1 tablet by mouth daily.  . potassium chloride SA (K-DUR,KLOR-CON) 20 MEQ tablet TAKE 1 TABLET(20 MEQ) BY MOUTH DAILY  . pravastatin (PRAVACHOL) 40 MG tablet Take one tablet by mouth daily.  [name] spironolactone (ALDACTONE) 50 MG tablet Take 50 mg by mouth daily.  . traZODone (DESYREL) 50 MG tablet TAKE 1 TO 2 TABLETS BY MOUTH AT BEDTIME FOR SLEEP  . tretinoin (RETIN-A) 0.025 % cream  Apply topically at bedtime.  . colchicine 0.6 MG tablet Take 1 tablet by mouth  daily (Patient not taking: Reported on 04/04/2018)  . [DISCONTINUED] potassium chloride SA (K-DUR,KLOR-CON) 20 MEQ tablet TAKE 1 TABLET(20 MEQ) BY MOUTH DAILY   No facility-administered encounter medications on file as of 04/04/2018.     Activities of Daily Living In your present state of health, do you have any difficulty performing the following activities: 04/04/2018 03/07/2018  Hearing? N N  Vision? N N  Difficulty concentrating or making decisions? N N  Walking or climbing stairs? N N  Dressing or bathing? N N  Doing errands, shopping? N N  Preparing Food and eating ? N -  Using the Toilet? N -  In the past six months, have you accidently leaked urine? N -  Do you have problems with loss of bowel control? N -  Managing your Medications? N -  Managing your Finances? N -  Housekeeping or managing your Housekeeping? N -  Some recent data might be hidden    Patient Care Team: Breeback, Jade L, PA-C as PCP - General (Family Medicine)    Assessment:   This is a routine wellness examination for Bonnie Torres.Physical assessment deferred to PCP.   Exercise Activities and Dietary recommendations Current Exercise Habits: The patient does not participate in regular exercise at present, Exercise limited by: None identified Diet Eating a healthy diet since found out diabetic. Breakfast: salad Lunch: doesn't eat Dinner: vegetable, meat. Drinks at least 32 ounces of water daily.      Goals    . Exercise 3x per week (30 min per time)     Start exercising at least 3 times a week for 30 minutes a day.       Fall Risk Fall Risk  04/04/2018 12/29/2017 04/13/2016  Falls in the past year? No No No   Is the patient's home free of loose throw rugs in walkways, pet beds, electrical cords, etc?   yes      Grab bars in the bathroom? no      Handrails on the stairs?   no      Adequate lighting?   yes    Depression  Screen PHQ 2/9 Scores 04/04/2018 12/29/2017 12/01/2017 03/31/2017  PHQ - 2 Score 0 0 0 0  PHQ- 9 Score - - - -     Cognitive Function     6CIT Screen 04/04/2018  What Year? 0 points  What month? 0 points  What time? 0 points  Count back from 20 0 points  Months in reverse 0 points  Repeat phrase 0 points  Total Score 0    Immunization History  Administered Date(s) Administered  . Influenza,inj,Quad PF,6+ Mos 04/15/2015, 03/23/2016, 03/31/2017, 03/07/2018  . Influenza-Unspecified 04/28/2011, 05/29/2013  . PPD Test 02/16/2018  . Pneumococcal Polysaccharide-23 12/01/2017  . Tdap 12/09/2010, 10/12/2014    Screening Tests Health Maintenance  Topic Date Due  . HEMOGLOBIN A1C  09/05/2018  . COLON CANCER SCREENING ANNUAL FOBT  11/24/2018  . OPHTHALMOLOGY EXAM  11/26/2018  . FOOT EXAM  02/17/2019  . MAMMOGRAM  05/28/2019  . Fecal DNA (Cologuard)  04/13/2020  . TETANUS/TDAP  10/11/2024  . INFLUENZA VACCINE  Completed  . PNEUMOCOCCAL POLYSACCHARIDE VACCINE AGE 2-64 HIGH RISK  Completed  . Hepatitis C Screening  Completed  . HIV Screening  Completed      Plan:  Please schedule your next medicare wellness visit with me in 1 yr.  Bonnie Torres , Thank you for taking time to come for your Medicare Wellness Visit. I appreciate your ongoing commitment to your health goals. Please review the following plan we discussed and let me know if I can assist you in the future.  Continue doing brain stimulating activities (puzzles, reading, adult coloring books, staying active) to keep memory sharp.    These are the goals we discussed: Goals    . Exercise 3x per week (30 min per time)     Start exercising at least 3 times a week for 30 minutes a day.       This is a list of the screening recommended for you and due dates:  Health Maintenance  Topic Date Due  . Hemoglobin A1C    09/05/2018  . Stool Blood Test  11/24/2018  . Eye exam for diabetics  11/26/2018  . Complete foot exam    02/17/2019  . Mammogram  05/28/2019  . Cologuard (Stool DNA test)  04/13/2020  . Tetanus Vaccine  10/11/2024  . Flu Shot  Completed  . Pneumococcal vaccine  Completed  .  Hepatitis C: One time screening is recommended by Center for Disease Control  (CDC) for  adults born from 1945 through 1965.   Completed  . HIV Screening  Completed      These are the goals we discussed: Goals    . Exercise 3x per week (30 min per time)     Start exercising at least 3 times a week for 30 minutes a day.       This is a list of the screening recommended for you and due dates:  Health Maintenance  Topic Date Due  . Hemoglobin A1C  09/05/2018  . Stool Blood Test  11/24/2018  . Eye exam for diabetics  11/26/2018  . Complete foot exam   02/17/2019  . Mammogram  05/28/2019  . Cologuard (Stool DNA test)  04/13/2020  . Tetanus Vaccine  10/11/2024  . Flu Shot  Completed  . Pneumococcal vaccine  Completed  .  Hepatitis C: One time screening is recommended by Center for Disease Control  (CDC) for  adults born from 1945 through 1965.   Completed  . HIV Screening  Completed      These are the goals we discussed: Goals    . Exercise 3x per week (30 min per time)     Start exercising at least 3 times a week for 30 minutes a day.       This is a list of the screening recommended for you and due dates:  Health Maintenance  Topic Date Due  . Hemoglobin A1C  09/05/2018  . Stool Blood Test  11/24/2018  . Eye exam for diabetics  11/26/2018  . Complete foot exam   02/17/2019  . Mammogram  05/28/2019  . Cologuard (Stool DNA test)  04/13/2020  . Tetanus Vaccine  10/11/2024  . Flu Shot  Completed  . Pneumococcal vaccine  Completed  .  Hepatitis C: One time screening is recommended by Center for Disease Control  (CDC) for  adults born from 1945 through 1965.   Completed  . HIV Screening  Completed     I have personally reviewed and noted the following in the patient's chart:   . Medical and social  history . Use of alcohol, tobacco or illicit drugs  . Current medications and supplements . Functional ability and status . Nutritional status . Physical activity . Advanced directives . List of other physicians . Hospitalizations, surgeries, and ER visits in previous 12 months . Vitals . Screenings to include cognitive, depression, and falls . Referrals and appointments  In addition, I have reviewed and discussed with patient certain preventive protocols, quality metrics, and best practice recommendations. A written personalized care plan for preventive services as well as general preventive health recommendations were provided to patient.     Kimberly A Gordon, LPN  04/04/2018     

## 2018-04-06 ENCOUNTER — Ambulatory Visit (INDEPENDENT_AMBULATORY_CARE_PROVIDER_SITE_OTHER): Payer: Medicare Other | Admitting: Physician Assistant

## 2018-04-06 ENCOUNTER — Encounter: Payer: Self-pay | Admitting: Physician Assistant

## 2018-04-06 VITALS — BP 100/69 | HR 72 | Ht 59.0 in | Wt 159.0 lb

## 2018-04-06 DIAGNOSIS — I10 Essential (primary) hypertension: Secondary | ICD-10-CM | POA: Diagnosis not present

## 2018-04-06 DIAGNOSIS — I499 Cardiac arrhythmia, unspecified: Secondary | ICD-10-CM

## 2018-04-06 DIAGNOSIS — F5101 Primary insomnia: Secondary | ICD-10-CM

## 2018-04-06 DIAGNOSIS — I959 Hypotension, unspecified: Secondary | ICD-10-CM | POA: Diagnosis not present

## 2018-04-06 MED ORDER — ZOLPIDEM TARTRATE 5 MG PO TABS: 5.0000 mg | ORAL_TABLET | Freq: Every evening | ORAL | 1 refills | Status: DC | PRN

## 2018-04-06 NOTE — Patient Instructions (Addendum)
Cut metoprolol in half.   Insomnia Insomnia is a sleep disorder that makes it difficult to fall asleep or to stay asleep. Insomnia can cause tiredness (fatigue), low energy, difficulty concentrating, mood swings, and poor performance at work or school. There are three different ways to classify insomnia:  Difficulty falling asleep.  Difficulty staying asleep.  Waking up too early in the morning.  Any type of insomnia can be long-term (chronic) or short-term (acute). Both are common. Short-term insomnia usually lasts for three months or less. Chronic insomnia occurs at least three times a week for longer than three months. What are the causes? Insomnia may be caused by another condition, situation, or substance, such as:  Anxiety.  Certain medicines.  Gastroesophageal reflux disease (GERD) or other gastrointestinal conditions.  Asthma or other breathing conditions.  Restless legs syndrome, sleep apnea, or other sleep disorders.  Chronic pain.  Menopause. This may include hot flashes.  Stroke.  Abuse of alcohol, tobacco, or illegal drugs.  Depression.  Caffeine.  Neurological disorders, such as Alzheimer disease.  An overactive thyroid (hyperthyroidism).  The cause of insomnia may not be known. What increases the risk? Risk factors for insomnia include:  Gender. Women are more commonly affected than men.  Age. Insomnia is more common as you get older.  Stress. This may involve your professional or personal life.  Income. Insomnia is more common in people with lower income.  Lack of exercise.  Irregular work schedule or night shifts.  Traveling between different time zones.  What are the signs or symptoms? If you have insomnia, trouble falling asleep or trouble staying asleep is the main symptom. This may lead to other symptoms, such as:  Feeling fatigued.  Feeling nervous about going to sleep.  Not feeling rested in the morning.  Having trouble  concentrating.  Feeling irritable, anxious, or depressed.  How is this treated? Treatment for insomnia depends on the cause. If your insomnia is caused by an underlying condition, treatment will focus on addressing the condition. Treatment may also include:  Medicines to help you sleep.  Counseling or therapy.  Lifestyle adjustments.  Follow these instructions at home:  Take medicines only as directed by your health care provider.  Keep regular sleeping and waking hours. Avoid naps.  Keep a sleep diary to help you and your health care provider figure out what could be causing your insomnia. Include: ? When you sleep. ? When you wake up during the night. ? How well you sleep. ? How rested you feel the next day. ? Any side effects of medicines you are taking. ? What you eat and drink.  Make your bedroom a comfortable place where it is easy to fall asleep: ? Put up shades or special blackout curtains to block light from outside. ? Use a white noise machine to block noise. ? Keep the temperature cool.  Exercise regularly as directed by your health care provider. Avoid exercising right before bedtime.  Use relaxation techniques to manage stress. Ask your health care provider to suggest some techniques that may work well for you. These may include: ? Breathing exercises. ? Routines to release muscle tension. ? Visualizing peaceful scenes.  Cut back on alcohol, caffeinated beverages, and cigarettes, especially close to bedtime. These can disrupt your sleep.  Do not overeat or eat spicy foods right before bedtime. This can lead to digestive discomfort that can make it hard for you to sleep.  Limit screen use before bedtime. This includes: ? Watching TV. ?  Using your smartphone, tablet, and computer.  Stick to a routine. This can help you fall asleep faster. Try to do a quiet activity, brush your teeth, and go to bed at the same time each night.  Get out of bed if you are  still awake after 15 minutes of trying to sleep. Keep the lights down, but try reading or doing a quiet activity. When you feel sleepy, go back to bed.  Make sure that you drive carefully. Avoid driving if you feel very sleepy.  Keep all follow-up appointments as directed by your health care provider. This is important. Contact a health care provider if:  You are tired throughout the day or have trouble in your daily routine due to sleepiness.  You continue to have sleep problems or your sleep problems get worse. Get help right away if:  You have serious thoughts about hurting yourself or someone else. This information is not intended to replace advice given to you by your health care provider. Make sure you discuss any questions you have with your health care provider. Document Released: 06/12/2000 Document Revised: 11/15/2015 Document Reviewed: 03/16/2014 Elsevier Interactive Patient Education  [employer].

## 2018-04-06 NOTE — Progress Notes (Signed)
Subjective:    Patient ID: Bonnie Torres, female    DOB: 12-05-1955, 62 y.o.   MRN: [id number]  HPI  Patient is a 62 year old female with type 2 diabetes, hypertension, history of renal cell carcinoma who presents to the clinic to discuss insomnia.  She states she has struggled with insomnia her whole life.  She is tried a lot of over-the-counter options in her younger days.  Melatonin seem to help for a while.  She recently started trazodone and it seemed to help some.  She is now taking 2 tablets of trazodone and it is taken at least 2 hours to go to sleep and then she wakes up 2 to 3 hours later.  She denies any snoring.  She wakes up somewhat rested.  She would like to try something else.  Her blood pressure in the office is low today.  She does report some dizziness and fatigue off and on.  She is on 4 different blood pressure medicines.  She is on metoprolol which is for her irregular heart beat that was diagnosed almost 30 years ago.  She denies any falling.  She recently had a follow-up with nephrology.  They released her out for 1 year follow-up on her renal cell carcinoma.  .. Active Ambulatory Problems    Diagnosis Date Noted  . Insomnia 08/31/2014  . Essential hypertension, benign 08/31/2014  . Hyperlipidemia 09/28/2014  . Bilateral low back pain without sciatica 04/17/2015  . Primary gout 10/16/2015  . Breast calcification, right 11/13/2015  . Elevated testosterone level in female 03/25/2016  . Mass of kidney 04/05/2016  . Uterine mass 04/27/2016  . Lymphangioma 06/18/2016  . Hot flashes due to surgical menopause 08/19/2016  . Obesity (BMI 30.0-34.9) 09/16/2016  . Hypokalemia 04/02/2017  . Anterior cervical adenopathy 05/19/2017  . Trigger middle finger of left hand 12/01/2017  . Type 2 diabetes mellitus without complication, without long-term current use of insulin ([place]) 12/03/2017  . Class 1 obesity due to excess calories without serious comorbidity with body mass  index (BMI) of 33.0 to 33.9 in adult 12/03/2017  . Elevated serum creatinine 12/07/2017  . Irregular heart rate 04/06/2018  . Hypotension 04/06/2018   Resolved Ambulatory Problems    Diagnosis Date Noted  . Mass of omentum 04/05/2016  . Hirsutism 04/05/2016  . Mass of ovary 04/05/2016  . Renal cell carcinoma ([place]) 08/18/2016   Past Medical History:  Diagnosis Date  . Gout   . Heart rate fast   . Hypertension   . 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      Objective:   Physical Exam  Constitutional: She appears well-developed and well-nourished.  HENT:  Head: Normocephalic and atraumatic.  Cardiovascular: Normal rate and regular rhythm.  Pulmonary/Chest: Effort normal and breath sounds normal.  Psychiatric: She has a normal mood and affect. Her behavior is normal.          Assessment & Plan:  Marland KitchenMarland KitchenDiagnoses and all orders for this visit:  Essential hypertension, benign  Irregular heart rate  Hypotension, unspecified hypotension type  Primary insomnia -     zolpidem (AMBIEN) 5 MG tablet; Take 1 tablet (5 mg total) by mouth at bedtime as needed for sleep.   Blood pressure is very low today.  Cut metoprolol in half.  I do not want her to completely go off this as she has had a history of irregular heart rate.  The other blood pressure medication is Tribenzor which is 3 medications together.  I rather not mess with this at this time.  Follow-up blood pressure recheck in 4 weeks.  We will try low-dose Ambien for her insomnia.  Stop trazodone.  Discussed good sleep hygiene.  Handout given for other symptomatic care.  Encouraged cold room temperature for better sleeping.  Follow-up in 1 month.

## 2018-04-07 ENCOUNTER — Other Ambulatory Visit: Payer: Self-pay

## 2018-04-07 NOTE — Patient Outreach (Signed)
[facility]) Care Management  04/07/2018  Bonnie Torres 1955/08/01 [id number]   Medication Adherence call to Bonnie Torres left a message for patient to call back patient is due on Pravastatin 40 mg and Olmesarta/Amlodepine /HCTZ 20/5/12.5 mg under [place].  [place] Management Direct Dial [phone]  Fax [phone] [email or web address]

## 2018-05-02 ENCOUNTER — Other Ambulatory Visit: Payer: Self-pay

## 2018-05-02 NOTE — Patient Outreach (Signed)
[facility]) Care Management  05/02/2018  Bonnie Torres 08/10/1955 [id number]   Medication Adherence call to Bonnie Torres left a message for patient to call back patient is due on Pravastatin 40 mg and Olmesartan/ amlodipine/HCTZ 20/5/12.5 mg under [place].   [place] Management Direct Dial [phone]  Fax [phone] [email or web address]

## 2018-05-04 ENCOUNTER — Ambulatory Visit (INDEPENDENT_AMBULATORY_CARE_PROVIDER_SITE_OTHER): Payer: Medicare Other | Admitting: Physician Assistant

## 2018-05-04 ENCOUNTER — Encounter: Payer: Self-pay | Admitting: Physician Assistant

## 2018-05-04 VITALS — BP 113/74 | HR 83 | Temp 98.5°F | Ht 59.0 in | Wt 159.8 lb

## 2018-05-04 DIAGNOSIS — I959 Hypotension, unspecified: Secondary | ICD-10-CM

## 2018-05-04 DIAGNOSIS — F5101 Primary insomnia: Secondary | ICD-10-CM | POA: Diagnosis not present

## 2018-05-04 MED ORDER — METOPROLOL SUCCINATE ER 25 MG PO TB24: 25.0000 mg | ORAL_TABLET | Freq: Every day | ORAL | 3 refills | Status: DC

## 2018-05-04 MED ORDER — ZOLPIDEM TARTRATE 10 MG PO TABS: 10.0000 mg | ORAL_TABLET | Freq: Every evening | ORAL | 0 refills | Status: DC | PRN

## 2018-05-04 NOTE — Progress Notes (Signed)
fol

## 2018-05-04 NOTE — Progress Notes (Signed)
   Subjective:    Patient ID: Bonnie Torres, female    DOB: 1956-03-09, 62 y.o.   MRN: [id number]  HPI Pt is a 62 yo female who presents to the clinic to follow up on blood pressure and insomnia.   Last visit we cut her metoprolol in half to 25mg  daily. She also stopped her spirolactone for facial hair. She really has not felt like that helped. She denies any dizziness. She has not been checking her BP.   She started [place] as well. He has provided some sleep. After taking she still reports 1-2 hours before she can go to sleep and still wakes up 4 hours later. This has been a problem for many years.   .. Active Ambulatory Problems    Diagnosis Date Noted  . Insomnia 08/31/2014  . Essential hypertension, benign 08/31/2014  . Hyperlipidemia 09/28/2014  . Bilateral low back pain without sciatica 04/17/2015  . Primary gout 10/16/2015  . Breast calcification, right 11/13/2015  . Elevated testosterone level in female 03/25/2016  . Mass of kidney 04/05/2016  . Uterine mass 04/27/2016  . Lymphangioma 06/18/2016  . Hot flashes due to surgical menopause 08/19/2016  . Obesity (BMI 30.0-34.9) 09/16/2016  . Hypokalemia 04/02/2017  . Anterior cervical adenopathy 05/19/2017  . Trigger middle finger of left hand 12/01/2017  . Type 2 diabetes mellitus without complication, without long-term current use of insulin ([place]) 12/03/2017  . Class 1 obesity due to excess calories without serious comorbidity with body mass index (BMI) of 33.0 to 33.9 in adult 12/03/2017  . Elevated serum creatinine 12/07/2017  . Irregular heart rate 04/06/2018  . Hypotension 04/06/2018   Resolved Ambulatory Problems    Diagnosis Date Noted  . Mass of omentum 04/05/2016  . Hirsutism 04/05/2016  . Mass of ovary 04/05/2016  . Renal cell carcinoma ([place]) 08/18/2016   Past Medical History:  Diagnosis Date  . Gout   . Heart rate fast   . Hypertension   . 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  Objective:   Physical Exam  Constitutional: She is oriented to person, place, and time. She appears well-developed and well-nourished.  HENT:  Head: Normocephalic and atraumatic.  Cardiovascular: Normal rate and regular rhythm.  Pulmonary/Chest: Effort normal and breath sounds normal.  Neurological: She is alert and oriented to person, place, and time.  Psychiatric: She has a normal mood and affect. Her behavior is normal.          Assessment & Plan:  Marland KitchenMarland KitchenFaith was seen today for follow-up.  Diagnoses and all orders for this visit:  Hypotension, unspecified hypotension type -     metoprolol succinate (TOPROL-XL) 25 MG 24 hr tablet; Take 1 tablet (25 mg total) by mouth daily.  Primary insomnia -     zolpidem (AMBIEN) 10 MG tablet; Take 1 tablet (10 mg total) by mouth at bedtime as needed for sleep.   BP is a little better and higher. Stay on spiroactolone. Decrease metoprolol to 25mg  daily. Follow up in 1 month with DM follow up.   Increase ambien to 10mg . Discussed good sleep routine. Follow up in 1 month.

## 2018-05-04 NOTE — Patient Instructions (Signed)
Increase ambien to 10mg .  Continue off spiroactlone Decrease metoprolol to 25mg  daily.

## 2018-05-10 ENCOUNTER — Other Ambulatory Visit: Payer: Self-pay | Admitting: Physician Assistant

## 2018-05-10 DIAGNOSIS — F5101 Primary insomnia: Secondary | ICD-10-CM

## 2018-06-06 ENCOUNTER — Ambulatory Visit: Payer: Medicare Other | Admitting: Physician Assistant

## 2018-06-06 ENCOUNTER — Other Ambulatory Visit: Payer: Self-pay

## 2018-06-06 DIAGNOSIS — F5101 Primary insomnia: Secondary | ICD-10-CM

## 2018-06-06 MED ORDER — ZOLPIDEM TARTRATE 10 MG PO TABS: 10.0000 mg | ORAL_TABLET | Freq: Every evening | ORAL | 0 refills | Status: DC | PRN

## 2018-06-06 NOTE — Telephone Encounter (Signed)
Pt was scheduled to follow up with PCP as directed, but appt had to be moved due to Physician being out. Routing for Provider to send electronically. Her appt has been rescheduled for this month.

## 2018-06-06 NOTE — Telephone Encounter (Signed)
PT needs a refill on this medication, Zolpidem. She is out and had an appt with [facility] scheduled for today. [facility] send to [facility], correct one on file. She needs this tonight, to be able to sleep.

## 2018-06-10 NOTE — Telephone Encounter (Signed)
Error

## 2018-06-13 ENCOUNTER — Other Ambulatory Visit: Payer: Self-pay | Admitting: Physician Assistant

## 2018-06-13 DIAGNOSIS — E119 Type 2 diabetes mellitus without complications: Secondary | ICD-10-CM

## 2018-06-15 ENCOUNTER — Ambulatory Visit: Payer: Medicare Other | Admitting: Physician Assistant

## 2018-06-15 DIAGNOSIS — L700 Acne vulgaris: Secondary | ICD-10-CM | POA: Diagnosis not present

## 2018-06-16 ENCOUNTER — Other Ambulatory Visit: Payer: Self-pay | Admitting: Physician Assistant

## 2018-06-16 DIAGNOSIS — E119 Type 2 diabetes mellitus without complications: Secondary | ICD-10-CM

## 2018-06-17 ENCOUNTER — Other Ambulatory Visit: Payer: Self-pay | Admitting: Physician Assistant

## 2018-06-17 DIAGNOSIS — E119 Type 2 diabetes mellitus without complications: Secondary | ICD-10-CM

## 2018-06-28 ENCOUNTER — Ambulatory Visit (INDEPENDENT_AMBULATORY_CARE_PROVIDER_SITE_OTHER): Payer: Medicare Other | Admitting: Physician Assistant

## 2018-06-28 ENCOUNTER — Encounter: Payer: Self-pay | Admitting: Physician Assistant

## 2018-06-28 VITALS — BP 109/41 | HR 97 | Ht 59.0 in | Wt 154.0 lb

## 2018-06-28 DIAGNOSIS — E119 Type 2 diabetes mellitus without complications: Secondary | ICD-10-CM | POA: Diagnosis not present

## 2018-06-28 DIAGNOSIS — E876 Hypokalemia: Secondary | ICD-10-CM

## 2018-06-28 DIAGNOSIS — F5101 Primary insomnia: Secondary | ICD-10-CM

## 2018-06-28 DIAGNOSIS — I10 Essential (primary) hypertension: Secondary | ICD-10-CM | POA: Diagnosis not present

## 2018-06-28 LAB — POCT GLYCOSYLATED HEMOGLOBIN (HGB A1C): HEMOGLOBIN A1C: 6 % — AB (ref 4.0–5.6)

## 2018-06-28 MED ORDER — POTASSIUM CHLORIDE CRYS ER 20 MEQ PO TBCR: EXTENDED_RELEASE_TABLET | ORAL | 1 refills | Status: DC

## 2018-06-28 MED ORDER — OLMESARTAN-AMLODIPINE-HCTZ 20-5-12.5 MG PO TABS: 1.0000 | ORAL_TABLET | Freq: Every day | ORAL | 1 refills | Status: DC

## 2018-06-28 MED ORDER — ZOLPIDEM TARTRATE 10 MG PO TABS: 10.0000 mg | ORAL_TABLET | Freq: Every evening | ORAL | 5 refills | Status: DC | PRN

## 2018-06-28 MED ORDER — METFORMIN HCL ER 750 MG PO TB24: ORAL_TABLET | ORAL | 1 refills | Status: DC

## 2018-06-28 NOTE — Progress Notes (Signed)
Subjective:    Patient ID: Bonnie Torres, female    DOB: 12-27-1955, 62 y.o.   MRN: [id number]  HPI  Pt is a 62 yo female with T2DM, HTN, hypokalemia, hx of cancer who presents to the clinic for 3 month follow up.   DM-pt not checking sugars. She is trying to keep to DM diet. No open sores or wounds. No hypoglyemia.   HTN- no CP, palpitations, headaches and vision changes.   Pt is feeling good.   She is sleeping pretty good on ambien.   .. Active Ambulatory Problems    Diagnosis Date Noted  . Insomnia 08/31/2014  . Essential hypertension, benign 08/31/2014  . Hyperlipidemia 09/28/2014  . Bilateral low back pain without sciatica 04/17/2015  . Primary gout 10/16/2015  . Breast calcification, right 11/13/2015  . Elevated testosterone level in female 03/25/2016  . Mass of kidney 04/05/2016  . Uterine mass 04/27/2016  . Lymphangioma 06/18/2016  . Hot flashes due to surgical menopause 08/19/2016  . Obesity (BMI 30.0-34.9) 09/16/2016  . Hypokalemia 04/02/2017  . Anterior cervical adenopathy 05/19/2017  . Trigger middle finger of left hand 12/01/2017  . Type 2 diabetes mellitus without complication, without long-term current use of insulin ([place]) 12/03/2017  . Class 1 obesity due to excess calories without serious comorbidity with body mass index (BMI) of 33.0 to 33.9 in adult 12/03/2017  . Elevated serum creatinine 12/07/2017  . Irregular heart rate 04/06/2018  . Hypotension 04/06/2018   Resolved Ambulatory Problems    Diagnosis Date Noted  . Mass of omentum 04/05/2016  . Hirsutism 04/05/2016  . Mass of ovary 04/05/2016  . Renal cell carcinoma ([place]) 08/18/2016   Past Medical History:  Diagnosis Date  . Gout   . Heart rate fast   . Hypertension   . 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      Objective:   Physical Exam Vitals signs reviewed.  Constitutional:      Appearance: Normal  appearance.  HENT:     Head: Normocephalic and atraumatic.  Cardiovascular:     Rate and Rhythm: Normal rate and regular rhythm.  Pulmonary:     Effort: Pulmonary effort is normal.     Breath sounds: Normal breath sounds.  Neurological:     General: No focal deficit present.     Mental Status: She is alert and oriented to person, place, and time.  Psychiatric:        Mood and Affect: Mood normal.        Behavior: Behavior normal.           Assessment & Plan:  Marland KitchenMarland KitchenFaith was seen today for follow-up.  Diagnoses and all orders for this visit:  Type 2 diabetes mellitus without complication, without long-term current use of insulin (H[facility]) -     POCT glycosylated hemoglobin (Hb A1C) -     metFORMIN (GLUCOPHAGE-XR) 750 MG 24 hr tablet; TAKE 1 TABLET(750 MG) BY MOUTH DAILY WITH BREAKFAST  Primary insomnia -     zolpidem (AMBIEN) 10 MG tablet; Take 1 tablet (10 mg total) by mouth at bedtime as needed for sleep.  Essential hypertension, benign -     Olmesartan-amLODIPine-HCTZ 20-5-12.5 MG TABS; Take 1 tablet by mouth daily.  Hypokalemia -     potassium chloride SA (K-DUR,KLOR-CON) 20 MEQ tablet; TAKE 1 TABLET(20 MEQ) BY MOUTH DAILY   .[name] Results for orders placed or performed in visit  on 06/28/18  POCT glycosylated hemoglobin (Hb A1C)  Result Value Ref Range   Hemoglobin A1C 6.0 (A) 4.0 - 5.6 %   HbA1c POC (<> result, manual entry)     HbA1c, POC (prediabetic range)     HbA1c, POC (controlled diabetic range)       A!C looks great.  Stay on same medications.  On STATIN.  On ACE/ARB. BP looks good.  Vaccines up to date.  Follow up in 6 months.   Refilled ambien for sleep doing well.

## 2018-07-17 ENCOUNTER — Other Ambulatory Visit: Payer: Self-pay | Admitting: Physician Assistant

## 2018-07-17 DIAGNOSIS — I10 Essential (primary) hypertension: Secondary | ICD-10-CM

## 2018-07-26 ENCOUNTER — Other Ambulatory Visit: Payer: Self-pay

## 2018-07-26 NOTE — Patient Outreach (Signed)
[facility]) Care Management  07/26/2018  Bonnie Torres [birth year] [id number]   Medication Adherence call to Mrs. [name] left a message for patient to call back patient is due on Metformin ER 750 mg. [facility] said patient last pick up was on 07/18/18 for a 90 days supply, patient will be due on March/2020.Mrs. [name] is showing past due under [place].   [place] Management Direct Dial [phone]  Fax [phone] [name]

## 2018-07-27 ENCOUNTER — Encounter: Payer: Self-pay | Admitting: Physician Assistant

## 2018-07-27 ENCOUNTER — Other Ambulatory Visit: Payer: Self-pay | Admitting: Physician Assistant

## 2018-07-27 ENCOUNTER — Ambulatory Visit (INDEPENDENT_AMBULATORY_CARE_PROVIDER_SITE_OTHER): Payer: Medicare Other | Admitting: Physician Assistant

## 2018-07-27 VITALS — BP 133/80 | HR 96 | Ht 59.0 in | Wt 162.0 lb

## 2018-07-27 DIAGNOSIS — M549 Dorsalgia, unspecified: Secondary | ICD-10-CM

## 2018-07-27 MED ORDER — HYDROCODONE-ACETAMINOPHEN 5-325 MG PO TABS: 1.0000 | ORAL_TABLET | Freq: Three times a day (TID) | ORAL | 0 refills | Status: AC | PRN

## 2018-07-27 MED ORDER — MELOXICAM 15 MG PO TABS: 15.0000 mg | ORAL_TABLET | Freq: Every day | ORAL | 0 refills | Status: DC

## 2018-07-27 MED ORDER — CYCLOBENZAPRINE HCL 10 MG PO TABS: 10.0000 mg | ORAL_TABLET | Freq: Three times a day (TID) | ORAL | 0 refills | Status: DC | PRN

## 2018-07-27 MED ORDER — KETOROLAC TROMETHAMINE 60 MG/2ML IM SOLN
60.0000 mg | Freq: Once | INTRAMUSCULAR | Status: AC
  Administered 2018-07-27: 60 mg via INTRAMUSCULAR

## 2018-07-27 NOTE — Progress Notes (Signed)
Subjective:    Patient ID: Bonnie Torres, female    DOB: 1956/05/20, 63 y.o.   MRN: [id number]  HPI  Pt is a 63 yo female who presents to the clinic with mid back pain that started suddenly 5 days ago. She admits she has started lifting weights with a 5lb dumbell recently for the last 2 weeks. She feels like pain is getting worse at times a 9/10. She has felt a little tingling into left arm. She tried a muscle relaxer she had at home but did not help at all. 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 Denies any weakness in left arm at this point.  .. Active Ambulatory Problems    Diagnosis Date Noted  . Insomnia 08/31/2014  . Essential hypertension, benign 08/31/2014  . Hyperlipidemia 09/28/2014  . Bilateral low back pain without sciatica 04/17/2015  . Primary gout 10/16/2015  . Breast calcification, right 11/13/2015  . Elevated testosterone level in female 03/25/2016  . Mass of kidney 04/05/2016  . Uterine mass 04/27/2016  . Lymphangioma 06/18/2016  . Hot flashes due to surgical menopause 08/19/2016  . Obesity (BMI 30.0-34.9) 09/16/2016  . Hypokalemia 04/02/2017  . Anterior cervical adenopathy 05/19/2017  . Trigger middle finger of left hand 12/01/2017  . Type 2 diabetes mellitus without complication, without long-term current use of insulin ([place]) 12/03/2017  . Class 1 obesity due to excess calories without serious comorbidity with body mass index (BMI) of 33.0 to 33.9 in adult 12/03/2017  . Elevated serum creatinine 12/07/2017  . Irregular heart rate 04/06/2018  . Hypotension 04/06/2018   Resolved Ambulatory Problems    Diagnosis Date Noted  . Mass of omentum 04/05/2016  . Hirsutism 04/05/2016  . Mass of ovary 04/05/2016  . Renal cell carcinoma ([place]) 08/18/2016   Past Medical History:  Diagnosis Date  . Gout   . Heart rate fast   . Hypertension   . 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  Objective:   Physical Exam Vitals signs reviewed.  Constitutional:      Appearance: Normal appearance.  HENT:     Head: Normocephalic and atraumatic.  Cardiovascular:     Rate and Rhythm: Normal rate.     Pulses: Normal pulses.     Heart sounds: Normal heart sounds.  Pulmonary:     Effort: Pulmonary effort is normal.     Breath sounds: Normal breath sounds.  Musculoskeletal:     Comments: Left sided paraspinal tenderness mid back and under scapula.  NROM of left shoulder. Strength 5/5 bilaterally.  No tenderness over spine to palpation.   Neurological:     General: No focal deficit present.     Mental Status: She is alert and oriented to person, place, and time.           Assessment & Plan:  Marland KitchenMarland KitchenFaith was seen today for shoulder pain.  Diagnoses and all orders for this visit:  Mid back pain on left side -     cyclobenzaprine (FLEXERIL) 10 MG tablet; Take 1 tablet (10 mg total) by mouth 3 (three) times daily as needed for muscle spasms. -     meloxicam (MOBIC) 15 MG tablet; Take 1 tablet (15 mg total) by mouth daily. -     HYDROcodone-acetaminophen (NORCO/VICODIN) 5-325 MG tablet; Take 1 tablet by mouth every 8 (eight) hours as needed for up to 5 days for moderate pain or  severe pain. -     ketorolac (TORADOL) injection 60 mg   No weakness and very little radiation into extermities. Could be muscular or have some disc involvement. For now started with :  Toradol in office.  Mobic start daily tomorrow.  Flexeril as needed up to three times a day.  Small quanity of norco for severe pain. [place] controlled substance database reviewed with no concerns.  Use more ice than heat.  Exercises given.  Consider tens unit.  Follow up if worsening or no improvement.  Stop lifting weights for now.  Written out of work for the rest of the week.

## 2018-07-27 NOTE — Patient Instructions (Signed)
Start [employer].  Flexeril as needed.  norco for severe pain.  Start exercises.  Use ice for now.   Mid-Back Strain Rehab Ask your health care provider which exercises are safe for you. Do exercises exactly as told by your health care provider and adjust them as directed. It is normal to feel mild stretching, pulling, tightness, or discomfort as you do these exercises, but you should stop right away if you feel sudden pain or your pain gets worse. Do not begin these exercises until told by your health care provider. Stretching and range of motion exercises This exercise warms up your muscles and joints and improves the movement and flexibility of your back and shoulders. This exercise also help to relieve pain. Exercise A: Chest and spine stretch  1. Lie down on your back on a firm surface. 2. Roll a towel or a small blanket so it is about 4 inches (10 cm) in diameter. 3. Put the towel lengthwise under the middle of your back so it is under your spine, but not under your shoulder blades. 4. To increase the stretch, you may put your hands behind your head and let your elbows fall to your sides. 5. Hold for __________ seconds. Repeat exercise __________ times. Complete this exercise __________ times a day. Strengthening exercises These exercises build strength and endurance in your back and your shoulder blade muscles. Endurance is the ability to use your muscles for a long time, even after they get tired. Exercise B: Alternating arm and leg raises  1. Get on your hands and knees on a firm surface. If you are on a hard floor, you may want to use padding to cushion your knees, such as an exercise mat. 2. Line up your arms and legs. Your hands should be below your shoulders, and your knees should be below your hips. 3. Lift your left leg behind you. At the same time, raise your right arm and straighten it in front of you. ? Do not lift your leg higher than your hip. ? Do not lift your arm  higher than your shoulder. ? Keep your abdominal and back muscles tight. ? Keep your hips facing the ground. ? Do not arch your back. ? Keep your balance carefully, and do not hold your breath. 4. Hold for __________ seconds. 5. Slowly return to the starting position and repeat with your right leg and your left arm. Repeat __________ times. Complete this exercise __________ times a day. Exercise C: Straight arm rows (shoulder extension)  1. Stand with your feet shoulder width apart. 2. Secure an exercise band to a stable object in front of you so the band is at or above shoulder height. 3. Hold one end of the exercise band in each hand. 4. Straighten your elbows and lift your hands up to shoulder height. 5. Step back, away from the secured end of the exercise band, until the band stretches. 6. Squeeze your shoulder blades together and pull your hands down to the sides of your thighs. Stop when your hands are straight down by your sides. Do not let your hands go behind your body. 7. Hold for __________ seconds. 8. Slowly return to the starting position. Repeat __________ times. Complete this exercise __________ times a day. Exercise D: Shoulder external rotation, prone 1. Lie on your abdomen on a firm bed so your left / right forearm hangs over the edge of the bed and your upper arm is on the bed, straight out from your  body. ? Your elbow should be bent. ? Your palm should be facing your feet. 2. If instructed, hold a __________ weight in your hand. 3. Squeeze your shoulder blade toward the middle of your back. Do not let your shoulder lift toward your ear. 4. Keep your elbow bent in an "L" shape (90 degrees) while you slowly move your forearm up toward the ceiling. Move your forearm up to the height of the bed, toward your head. ? Your upper arm should not move. ? At the top of the movement, your palm should face the floor. 5. Hold for __________ seconds. 6. Slowly return to the starting  position and relax your muscles. Repeat __________ times. Complete this exercise __________ times a day. Exercise E: Scapular retraction and external rotation, rowing  1. Sit in a stable chair without armrests, or stand. 2. Secure an exercise band to a stable object in front of you so it is at shoulder height. 3. Hold one end of the exercise band in each hand. 4. Bring your arms out straight in front of you. 5. Step back, away from the secured end of the exercise band, until the band stretches. 6. Pull the band backward. As you do this, bend your elbows and squeeze your shoulder blades together, but avoid letting the rest of your body move. Do not let your shoulders lift up toward your ears. 7. Stop when your elbows are at your sides or slightly behind your body. 8. Hold for __________ seconds. 9. Slowly straighten your arms to return to the starting position. Repeat __________ times. Complete this exercise __________ times a day. Posture and body mechanics  Body mechanics refers to the movements and positions of your body while you do your daily activities. Posture is part of body mechanics. Good posture and healthy body mechanics can help to relieve stress in your body's tissues and joints. Good posture means that your spine is in its natural S-curve position (your spine is neutral), your shoulders are pulled back slightly, and your head is not tipped forward. The following are general guidelines for applying improved posture and body mechanics to your everyday activities. Standing   When standing, keep your spine neutral and your feet about hip-width apart. Keep a slight bend in your knees. Your ears, shoulders, and hips should line up.  When you do a task in which you lean forward while standing in one place for a long time, place one foot up on a stable object that is 2-4 inches (5-10 cm) high, such as a footstool. This helps keep your spine neutral. Sitting   When sitting, keep your  spine neutral and keep your feet flat on the floor. Use a footrest, if necessary, and keep your thighs parallel to the floor. Avoid rounding your shoulders, and avoid tilting your head forward.  When working at a desk or a computer, keep your desk at a height where your hands are slightly lower than your elbows. Slide your chair under your desk so you are close enough to maintain good posture.  When working at a computer, place your monitor at a height where you are looking straight ahead and you do not have to tilt your head forward or downward to look at the screen. Resting When lying down and resting, avoid positions that are most painful for you.  If you have pain with activities such as sitting, bending, stooping, or squatting (flexion-based activities), lie in a position in which your body does not bend very  much. For example, avoid curling up on your side with your arms and knees near your chest (fetal position).  If you have pain with activities such as standing for a long time or reaching with your arms (extension-based activities), lie with your spine in a neutral position and bend your knees slightly. Try the following positions:  Lying on your side with a pillow between your knees.  Lying on your back with a pillow under your knees.  Lifting   When lifting objects, keep your feet at least shoulder-width apart and tighten your abdominal muscles.  Bend your knees and hips and keep your spine neutral. It is important to lift using the strength of your legs, not your back. Do not lock your knees straight out.  Always ask for help to lift heavy or awkward objects. This information is not intended to replace advice given to you by your health care provider. Make sure you discuss any questions you have with your health care provider. Document Released: 06/15/2005 Document Revised: 02/20/2016 Document Reviewed: 03/27/2015 Elsevier Interactive Patient Education  2019 [employer].

## 2018-07-29 ENCOUNTER — Encounter: Payer: Self-pay | Admitting: Physician Assistant

## 2018-07-29 DIAGNOSIS — M549 Dorsalgia, unspecified: Secondary | ICD-10-CM | POA: Insufficient documentation

## 2018-08-16 ENCOUNTER — Other Ambulatory Visit: Payer: Self-pay | Admitting: Physician Assistant

## 2018-08-16 DIAGNOSIS — M549 Dorsalgia, unspecified: Secondary | ICD-10-CM

## 2018-08-18 DIAGNOSIS — S39012A Strain of muscle, fascia and tendon of lower back, initial encounter: Secondary | ICD-10-CM | POA: Diagnosis not present

## 2018-08-18 DIAGNOSIS — M4316 Spondylolisthesis, lumbar region: Secondary | ICD-10-CM | POA: Diagnosis not present

## 2018-08-18 DIAGNOSIS — Z79899 Other long term (current) drug therapy: Secondary | ICD-10-CM | POA: Diagnosis not present

## 2018-08-18 DIAGNOSIS — M5136 Other intervertebral disc degeneration, lumbar region: Secondary | ICD-10-CM | POA: Diagnosis not present

## 2018-08-18 DIAGNOSIS — M109 Gout, unspecified: Secondary | ICD-10-CM | POA: Diagnosis not present

## 2018-08-18 DIAGNOSIS — M4317 Spondylolisthesis, lumbosacral region: Secondary | ICD-10-CM | POA: Diagnosis not present

## 2018-08-18 DIAGNOSIS — S20211A Contusion of right front wall of thorax, initial encounter: Secondary | ICD-10-CM | POA: Diagnosis not present

## 2018-08-18 DIAGNOSIS — E119 Type 2 diabetes mellitus without complications: Secondary | ICD-10-CM | POA: Diagnosis not present

## 2018-08-18 DIAGNOSIS — R079 Chest pain, unspecified: Secondary | ICD-10-CM | POA: Diagnosis not present

## 2018-08-18 DIAGNOSIS — I10 Essential (primary) hypertension: Secondary | ICD-10-CM | POA: Diagnosis not present

## 2018-08-18 DIAGNOSIS — S161XXA Strain of muscle, fascia and tendon at neck level, initial encounter: Secondary | ICD-10-CM | POA: Diagnosis not present

## 2018-08-22 ENCOUNTER — Ambulatory Visit (INDEPENDENT_AMBULATORY_CARE_PROVIDER_SITE_OTHER): Payer: Medicare Other | Admitting: Physician Assistant

## 2018-08-22 ENCOUNTER — Encounter: Payer: Self-pay | Admitting: Physician Assistant

## 2018-08-22 DIAGNOSIS — S20211D Contusion of right front wall of thorax, subsequent encounter: Secondary | ICD-10-CM

## 2018-08-22 MED ORDER — MELOXICAM 15 MG PO TABS: 15.0000 mg | ORAL_TABLET | Freq: Every day | ORAL | 1 refills | Status: DC

## 2018-08-22 MED ORDER — CYCLOBENZAPRINE HCL 10 MG PO TABS: 10.0000 mg | ORAL_TABLET | Freq: Three times a day (TID) | ORAL | 0 refills | Status: DC | PRN
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
## 2018-08-22 NOTE — Patient Instructions (Signed)
Rib Contusion  A rib contusion is a deep bruise on your rib area. Contusions are the result of a blunt trauma that causes bleeding and injury to the tissues under the skin. A rib contusion may involve bruising of the ribs and of the skin and muscles in the area. The skin over the contusion may turn blue, purple, or yellow. Minor injuries will give you a painless contusion. More severe contusions may stay painful and swollen for a few weeks.  What are the causes?  This condition is usually caused by a blow, trauma, or direct force to an area of the body. This often occurs while playing contact sports.  What are the signs or symptoms?  Symptoms of this condition include:   Swelling and redness of the injured area.   Discoloration of the injured area.   Tenderness and soreness of the injured area.   Pain with or without movement.  How is this diagnosed?  This condition may be diagnosed based on:   Your symptoms and medical history.   A physical exam.   Imaging tests-such as an X-ray, CT scan, or MRI-to determine if there were internal injuries or broken bones (fractures).  How is this treated?  This condition may be treated with:   Rest. This is often the best treatment for a rib contusion.   Icing. This reduces swelling and inflammation.   Deep-breathing exercises. These may be recommended to reduce the risk for lung collapse and pneumonia.   Medicines. Over-the-counter or prescription medicines may be given to control pain.   Injection of a numbing medicine around the nerve near your injury (nerve block).  Follow these instructions at home:         Medicines   Take over-the-counter and prescription medicines only as told by your health care provider.   Do not drive or use heavy machinery while taking prescription pain medicine.   If you are taking prescription pain medicine, take actions to prevent or treat constipation. Your health care provider may recommend that you:  ? Drink enough fluid to keep  your urine pale yellow.  ? Eat foods that are high in fiber, such as fresh fruits and vegetables, whole grains, and beans.  ? Limit foods that are high in fat and processed sugars, such as fried or sweet foods.  ? Take an over-the-counter or prescription medicine for constipation.  Managing pain, stiffness, and swelling   If directed, put ice on the injured area:  ? Put ice in a plastic bag.  ? Place a towel between your skin and the bag.  ? Leave the ice on for 20 minutes, 2-3 times a day.   Rest the injured area. Avoid strenuous activity and any activities or movements that cause pain. Be careful during activities and avoid bumping the injured area.   Do not lift anything that is heavier than 5 lb (2.3 kg), or the limit that you are told, until your health care provider says that it is safe.  General instructions   Do not use any products that contain nicotine or tobacco, such as cigarettes and e-cigarettes. These can delay healing. If you need help quitting, ask your health care provider.   Do deep-breathing exercises as told by your health care provider.   If you were given an incentive spirometer, use it every 1-2 hours while you are awake, or as recommended by your health care provider. This device measures how well you are filling your lungs with each breath.     Keep all follow-up visits as told by your health care provider. This is important.  Contact a health care provider if you have:   Increased bruising or swelling.   Pain that is not controlled with treatment.   A fever.  Get help right away if you:   Have difficulty breathing or shortness of breath.   Develop a continual cough or you cough up thick or bloody sputum.   Feel nauseous or you vomit.   Have pain in your abdomen.  Summary   A rib contusion is a deep bruise on your rib area. Contusions are the result of a blunt trauma that causes bleeding and injury to the tissues under the skin.   The skin overlying the contusion may turn  blue, purple, or yellow. Minor injuries may give you a painless contusion. More severe contusions may stay painful and swollen for a few weeks.   Rest the injured area. Avoid strenuous activity and any activities or movements that cause pain.  This information is not intended to replace advice given to you by your health care provider. Make sure you discuss any questions you have with your health care provider.  Document Released: 03/10/2001 Document Revised: 07/14/2017 Document Reviewed: 07/14/2017  Elsevier Interactive Patient Education  2019 [employer]

## 2018-10-11 ENCOUNTER — Other Ambulatory Visit: Payer: Self-pay | Admitting: Physician Assistant

## 2018-10-11 DIAGNOSIS — S20211D Contusion of right front wall of thorax, subsequent encounter: Secondary | ICD-10-CM
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
## 2018-12-10 ENCOUNTER — Other Ambulatory Visit: Payer: Self-pay | Admitting: Physician Assistant

## 2018-12-10 DIAGNOSIS — E876 Hypokalemia: Secondary | ICD-10-CM

## 2018-12-26 ENCOUNTER — Ambulatory Visit (INDEPENDENT_AMBULATORY_CARE_PROVIDER_SITE_OTHER): Payer: Medicare Other | Admitting: Physician Assistant

## 2018-12-26 ENCOUNTER — Encounter: Payer: Self-pay | Admitting: Physician Assistant

## 2018-12-26 VITALS — BP 119/68 | HR 80 | Temp 98.4°F | Ht 59.0 in | Wt 166.0 lb

## 2018-12-26 DIAGNOSIS — Z23 Encounter for immunization: Secondary | ICD-10-CM | POA: Diagnosis not present

## 2018-12-26 DIAGNOSIS — E119 Type 2 diabetes mellitus without complications: Secondary | ICD-10-CM

## 2018-12-26 DIAGNOSIS — E782 Mixed hyperlipidemia: Secondary | ICD-10-CM

## 2018-12-26 DIAGNOSIS — I10 Essential (primary) hypertension: Secondary | ICD-10-CM | POA: Diagnosis not present

## 2018-12-26 DIAGNOSIS — F5101 Primary insomnia: Secondary | ICD-10-CM

## 2018-12-26 DIAGNOSIS — E876 Hypokalemia: Secondary | ICD-10-CM

## 2018-12-26 LAB — POCT GLYCOSYLATED HEMOGLOBIN (HGB A1C): Hemoglobin A1C: 6.2 % — AB (ref 4.0–5.6)

## 2018-12-26 MED ORDER — METFORMIN HCL ER 750 MG PO TB24: ORAL_TABLET | ORAL | 1 refills | Status: DC

## 2018-12-26 MED ORDER — ZOLPIDEM TARTRATE 10 MG PO TABS: 10.0000 mg | ORAL_TABLET | Freq: Every evening | ORAL | 5 refills | Status: DC | PRN

## 2018-12-26 MED ORDER — POTASSIUM CHLORIDE CRYS ER 20 MEQ PO TBCR: EXTENDED_RELEASE_TABLET | ORAL | 1 refills | Status: DC

## 2018-12-26 MED ORDER — OLMESARTAN-AMLODIPINE-HCTZ 20-5-12.5 MG PO TABS: 1.0000 | ORAL_TABLET | Freq: Every day | ORAL | 1 refills | Status: DC

## 2018-12-26 MED ORDER — PRAVASTATIN SODIUM 40 MG PO TABS: ORAL_TABLET | ORAL | 4 refills | Status: DC

## 2018-12-26 NOTE — Progress Notes (Signed)
Subjective:    Patient ID: Bonnie Torres, female    DOB: 30-Aug-1955, 63 y.o.   MRN: [id number]  HPI  Pt is a 63 yo female with T2DM, HTN, insomnia, HLD who presents to the clinic for 6 month follow up.   Pt is not checking sugars at home. She denies any hypoglycemic events. No open sores or wounds. No problems or concerns. On metformin. She is not exercising. She tries to watch sugars and carbs. Eye exam on the 8th of July scheduled.   Pt denies any CP, palpitations, headaches or vision changes.   Pt is sleeping well with ambien. No changes.   .. Active Ambulatory Problems    Diagnosis Date Noted  . Insomnia 08/31/2014  . Essential hypertension, benign 08/31/2014  . Hyperlipidemia 09/28/2014  . Bilateral low back pain without sciatica 04/17/2015  . Primary gout 10/16/2015  . Breast calcification, right 11/13/2015  . Elevated testosterone level in female 03/25/2016  . Mass of kidney 04/05/2016  . Uterine mass 04/27/2016  . Lymphangioma 06/18/2016  . Hot flashes due to surgical menopause 08/19/2016  . Obesity (BMI 30.0-34.9) 09/16/2016  . Hypokalemia 04/02/2017  . Anterior cervical adenopathy 05/19/2017  . Trigger middle finger of left hand 12/01/2017  . Type 2 diabetes mellitus without complication, without long-term current use of insulin ([place]) 12/03/2017  . Class 1 obesity due to excess calories without serious comorbidity with body mass index (BMI) of 33.0 to 33.9 in adult 12/03/2017  . Elevated serum creatinine 12/07/2017  . Irregular heart rate 04/06/2018  . Hypotension 04/06/2018  . Mid back pain on left side 07/29/2018   Resolved Ambulatory Problems    Diagnosis Date Noted  . Mass of omentum 04/05/2016  . Hirsutism 04/05/2016  . Mass of ovary 04/05/2016  . Renal cell carcinoma ([place]) 08/18/2016   Past Medical History:  Diagnosis Date  . Gout   . Heart rate fast   . Hypertension   . 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      Objective:   Physical Exam Vitals signs reviewed.  Constitutional:      Appearance: Normal appearance. She is obese.  Cardiovascular:     Rate and Rhythm: Normal rate and regular rhythm.  Pulmonary:     Effort: Pulmonary effort is normal.     Breath sounds: Normal breath sounds.  Neurological:     General: No focal deficit present.     Mental Status: She is alert and oriented to person, place, and time.  Psychiatric:        Mood and Affect: Mood normal.        Behavior: Behavior normal.           Assessment & Plan:  Marland KitchenMarland KitchenFaith was seen today for diabetes.  Diagnoses and all orders for this visit:  Type 2 diabetes mellitus without complication, without long-term current use of insulin ([facility]) -     POCT glycosylated hemoglobin (Hb A1C) -     metFORMIN (GLUCOPHAGE-XR) 750 MG 24 hr tablet; TAKE 1 TABLET(750 MG) BY MOUTH DAILY WITH BREAKFAST  Essential hypertension, benign -     Olmesartan-amLODIPine-HCTZ 20-5-12.5 MG TABS; Take 1 tablet by mouth daily.  Hypokalemia -     potassium chloride SA (K-DUR) 20 MEQ tablet; Take one tablet daily. -     COMPLETE METABOLIC PANEL WITH GFR  Pure hypercholesterolemia -     pravastatin (PRAVACHOL) 40 MG  tablet; Take one tablet by mouth daily. -     Lipid Panel w/reflex Direct LDL  Primary insomnia -     zolpidem (AMBIEN) 10 MG tablet; Take 1 tablet (10 mg total) by mouth at bedtime as needed for sleep.  Need for shingles vaccine -     Varicella-zoster vaccine IM     .[name] Results for orders placed or performed in visit on 12/26/18  POCT glycosylated hemoglobin (Hb A1C)  Result Value Ref Range   Hemoglobin A1C 6.2 (A) 4.0 - 5.6 %   HbA1c POC (<> result, manual entry)     HbA1c, POC (prediabetic range)     HbA1c, POC (controlled diabetic range)     A1C controlled.  Continue on same medications.  On statin. On ACE. BP controlled.  Eye exam scheduled.  Vaccines up to date.  Foot  exam up to date.  Follow up in 6 months.   1st shingrix given today without complications. Follow up in 2 months for 2nd dose.

## 2018-12-27 ENCOUNTER — Ambulatory Visit: Payer: Medicare Other | Admitting: Physician Assistant

## 2019-01-04 DIAGNOSIS — E119 Type 2 diabetes mellitus without complications: Secondary | ICD-10-CM | POA: Diagnosis not present

## 2019-01-04 DIAGNOSIS — H2513 Age-related nuclear cataract, bilateral: Secondary | ICD-10-CM | POA: Diagnosis not present

## 2019-01-04 DIAGNOSIS — Z7984 Long term (current) use of oral hypoglycemic drugs: Secondary | ICD-10-CM | POA: Diagnosis not present

## 2019-01-04 LAB — HM DIABETES EYE EXAM

## 2019-01-24 ENCOUNTER — Other Ambulatory Visit: Payer: Self-pay

## 2019-01-24 ENCOUNTER — Ambulatory Visit (INDEPENDENT_AMBULATORY_CARE_PROVIDER_SITE_OTHER): Payer: Medicare Other | Admitting: Physician Assistant

## 2019-01-24 ENCOUNTER — Encounter: Payer: Self-pay | Admitting: Physician Assistant

## 2019-01-24 VITALS — BP 136/79 | HR 92 | Temp 98.4°F | Ht 59.0 in

## 2019-01-24 DIAGNOSIS — M109 Gout, unspecified: Secondary | ICD-10-CM

## 2019-01-24 DIAGNOSIS — M79671 Pain in right foot: Secondary | ICD-10-CM

## 2019-01-24 DIAGNOSIS — E790 Hyperuricemia without signs of inflammatory arthritis and tophaceous disease: Secondary | ICD-10-CM

## 2019-01-24 DIAGNOSIS — Z8739 Personal history of other diseases of the musculoskeletal system and connective tissue: Secondary | ICD-10-CM

## 2019-01-24 DIAGNOSIS — M79674 Pain in right toe(s): Secondary | ICD-10-CM | POA: Diagnosis not present

## 2019-01-24 MED ORDER — PREDNISONE 20 MG PO TABS: 20.0000 mg | ORAL_TABLET | Freq: Two times a day (BID) | ORAL | 0 refills | Status: DC

## 2019-01-24 MED ORDER — COLCHICINE 0.6 MG PO TABS: ORAL_TABLET | ORAL | 0 refills | Status: DC

## 2019-01-24 NOTE — Patient Instructions (Signed)
Gout  Gout is painful swelling of your joints. Gout is a type of arthritis. It is caused by having too much uric acid in your body. Uric acid is a chemical that is made when your body breaks down substances called purines. If your body has too much uric acid, sharp crystals can form and build up in your joints. This causes pain and swelling. Gout attacks can happen quickly and be very painful (acute gout). Over time, the attacks can affect more joints and happen more often (chronic gout). What are the causes?  Too much uric acid in your blood. This can happen because: ? Your kidneys do not remove enough uric acid from your blood. ? Your body makes too much uric acid. ? You eat too many foods that are high in purines. These foods include organ meats, some seafood, and beer.  Trauma or stress. What increases the risk?  Having a family history of gout.  Being female and middle-aged.  Being female and having gone through menopause.  Being very overweight (obese).  Drinking alcohol, especially beer.  Not having enough water in the body (being dehydrated).  Losing weight too quickly.  Having an organ transplant.  Having lead poisoning.  Taking certain medicines.  Having kidney disease.  Having a skin condition called psoriasis. What are the signs or symptoms? An attack of acute gout usually happens in just one joint. The most common place is the big toe. Attacks often start at night. Other joints that may be affected include joints of the feet, ankle, knee, fingers, wrist, or elbow. Symptoms of an attack may include:  Very bad pain.  Warmth.  Swelling.  Stiffness.  Shiny, red, or purple skin.  Tenderness. The affected joint may be very painful to touch.  Chills and fever. Chronic gout may cause symptoms more often. More joints may be involved. You may also have white or yellow lumps (tophi) on your hands or feet or in other areas near your joints. How is this treated?   Treatment for this condition has two phases: treating an acute attack and preventing future attacks.  Acute gout treatment may include: ? NSAIDs. ? Steroids. These are taken by mouth or injected into a joint. ? Colchicine. This medicine relieves pain and swelling. It can be given by mouth or through an IV tube.  Preventive treatment may include: ? Taking small doses of NSAIDs or colchicine daily. ? Using a medicine that reduces uric acid levels in your blood. ? Making changes to your diet. You may need to see a food expert (dietitian) about what to eat and drink to prevent gout. Follow these instructions at home: During a gout attack   If told, put ice on the painful area: ? Put ice in a plastic bag. ? Place a towel between your skin and the bag. ? Leave the ice on for 20 minutes, 2-3 times a day.  Raise (elevate) the painful joint above the level of your heart as often as you can.  Rest the joint as much as possible. If the joint is in your leg, you may be given crutches.  Follow instructions from your doctor about what you cannot eat or drink. Avoiding future gout attacks  Eat a low-purine diet. Avoid foods and drinks such as: ? Liver. ? Kidney. ? Anchovies. ? Asparagus. ? Herring. ? Mushrooms. ? Mussels. ? Beer.  Stay at a healthy weight. If you want to lose weight, talk with your doctor. Do not lose weight  too fast.  Start or continue an exercise plan as told by your doctor. Eating and drinking  Drink enough fluids to keep your pee (urine) pale yellow.  If you drink alcohol: ? Limit how much you use to:  0-1 drink a day for women.  0-2 drinks a day for men. ? Be aware of how much alcohol is in your drink. In the [place] one drink equals one 12 oz bottle of beer (355 mL), one 5 oz glass of wine (148 mL), or one 1 oz glass of hard liquor (44 mL). General instructions  Take over-the-counter and prescription medicines only as told by your doctor.  Do not drive or  use heavy machinery while taking prescription pain medicine.  Return to your normal activities as told by your doctor. Ask your doctor what activities are safe for you.  Keep all follow-up visits as told by your doctor. This is important. Contact a doctor if:  You have another gout attack.  You still have symptoms of a gout attack after 10 days of treatment.  You have problems (side effects) because of your medicines.  You have chills or a fever.  You have burning pain when you pee (urinate).  You have pain in your lower back or belly. Get help right away if:  You have very bad pain.  Your pain cannot be controlled.  You cannot pee. Summary  Gout is painful swelling of the joints.  The most common site of pain is the big toe, but it can affect other joints.  Medicines and avoiding some foods can help to prevent and treat gout attacks. This information is not intended to replace advice given to you by your health care provider. Make sure you discuss any questions you have with your health care provider. Document Released: 03/24/2008 Document Revised: 01/05/2018 Document Reviewed: 01/05/2018 Elsevier Patient Education  [employer]. Gout  Gout is painful swelling of your joints. Gout is a type of arthritis. It is caused by having too much uric acid in your body. Uric acid is a chemical that is made when your body breaks down substances called purines. If your body has too much uric acid, sharp crystals can form and build up in your joints. This causes pain and swelling. Gout attacks can happen quickly and be very painful (acute gout). Over time, the attacks can affect more joints and happen more often (chronic gout). What are the causes?  Too much uric acid in your blood. This can happen because: ? Your kidneys do not remove enough uric acid from your blood. ? Your body makes too much uric acid. ? You eat too many foods that are high in purines. These foods include  organ meats, some seafood, and beer.  Trauma or stress. What increases the risk?  Having a family history of gout.  Being female and middle-aged.  Being female and having gone through menopause.  Being very overweight (obese).  Drinking alcohol, especially beer.  Not having enough water in the body (being dehydrated).  Losing weight too quickly.  Having an organ transplant.  Having lead poisoning.  Taking certain medicines.  Having kidney disease.  Having a skin condition called psoriasis. What are the signs or symptoms? An attack of acute gout usually happens in just one joint. The most common place is the big toe. Attacks often start at night. Other joints that may be affected include joints of the feet, ankle, knee, fingers, wrist, or elbow. Symptoms of an attack  may include:  Very bad pain.  Warmth.  Swelling.  Stiffness.  Shiny, red, or purple skin.  Tenderness. The affected joint may be very painful to touch.  Chills and fever. Chronic gout may cause symptoms more often. More joints may be involved. You may also have white or yellow lumps (tophi) on your hands or feet or in other areas near your joints. How is this treated?  Treatment for this condition has two phases: treating an acute attack and preventing future attacks.  Acute gout treatment may include: ? NSAIDs. ? Steroids. These are taken by mouth or injected into a joint. ? Colchicine. This medicine relieves pain and swelling. It can be given by mouth or through an IV tube.  Preventive treatment may include: ? Taking small doses of NSAIDs or colchicine daily. ? Using a medicine that reduces uric acid levels in your blood. ? Making changes to your diet. You may need to see a food expert (dietitian) about what to eat and drink to prevent gout. Follow these instructions at home: During a gout attack   If told, put ice on the painful area: ? Put ice in a plastic bag. ? Place a towel between your  skin and the bag. ? Leave the ice on for 20 minutes, 2-3 times a day.  Raise (elevate) the painful joint above the level of your heart as often as you can.  Rest the joint as much as possible. If the joint is in your leg, you may be given crutches.  Follow instructions from your doctor about what you cannot eat or drink. Avoiding future gout attacks  Eat a low-purine diet. Avoid foods and drinks such as: ? Liver. ? Kidney. ? Anchovies. ? Asparagus. ? Herring. ? Mushrooms. ? Mussels. ? Beer.  Stay at a healthy weight. If you want to lose weight, talk with your doctor. Do not lose weight too fast.  Start or continue an exercise plan as told by your doctor. Eating and drinking  Drink enough fluids to keep your pee (urine) pale yellow.  If you drink alcohol: ? Limit how much you use to:  0-1 drink a day for women.  0-2 drinks a day for men. ? Be aware of how much alcohol is in your drink. In the [place] one drink equals one 12 oz bottle of beer (355 mL), one 5 oz glass of wine (148 mL), or one 1 oz glass of hard liquor (44 mL). General instructions  Take over-the-counter and prescription medicines only as told by your doctor.  Do not drive or use heavy machinery while taking prescription pain medicine.  Return to your normal activities as told by your doctor. Ask your doctor what activities are safe for you.  Keep all follow-up visits as told by your doctor. This is important. Contact a doctor if:  You have another gout attack.  You still have symptoms of a gout attack after 10 days of treatment.  You have problems (side effects) because of your medicines.  You have chills or a fever.  You have burning pain when you pee (urinate).  You have pain in your lower back or belly. Get help right away if:  You have very bad pain.  Your pain cannot be controlled.  You cannot pee. Summary  Gout is painful swelling of the joints.  The most common site of pain is the  big toe, but it can affect other joints.  Medicines and avoiding some foods can help to prevent  and treat gout attacks. This information is not intended to replace advice given to you by your health care provider. Make sure you discuss any questions you have with your health care provider. Document Released: 03/24/2008 Document Revised: 01/05/2018 Document Reviewed: 01/05/2018 Elsevier Patient Education  2020 [employer].

## 2019-01-24 NOTE — Progress Notes (Signed)
Subjective:    Patient ID: Bonnie Torres, female    DOB: 1955/07/27, 63 y.o.   MRN: [id number]  HPI  Pt is a 63 yo female with a hx of gout who presents to the clinic with progressive right toe and foot pain for a few weeks but 7/10 pain since last Wednesday. Denies any injury. Foot feels warm to touch. Wearing socks and shoes hurt. She is not able to put weight on it. Pt report pain started in great right toe and then distributed throughout whole foot. She has been eating some seafood "but not a lot". She has not tried anything to make better.   .. Active Ambulatory Problems    Diagnosis Date Noted  . Insomnia 08/31/2014  . Essential hypertension, benign 08/31/2014  . Hyperlipidemia 09/28/2014  . Bilateral low back pain without sciatica 04/17/2015  . Primary gout 10/16/2015  . Breast calcification, right 11/13/2015  . Elevated testosterone level in female 03/25/2016  . Mass of kidney 04/05/2016  . Uterine mass 04/27/2016  . Lymphangioma 06/18/2016  . Hot flashes due to surgical menopause 08/19/2016  . Obesity (BMI 30.0-34.9) 09/16/2016  . Hypokalemia 04/02/2017  . Anterior cervical adenopathy 05/19/2017  . Trigger middle finger of left hand 12/01/2017  . Type 2 diabetes mellitus without complication, without long-term current use of insulin ([place]) 12/03/2017  . Class 1 obesity due to excess calories without serious comorbidity with body mass index (BMI) of 33.0 to 33.9 in adult 12/03/2017  . Elevated serum creatinine 12/07/2017  . Irregular heart rate 04/06/2018  . Hypotension 04/06/2018  . Mid back pain on left side 07/29/2018  . Personal history of gout 01/25/2019  . Acute gout of right foot 01/25/2019  . Elevated uric acid in blood 01/25/2019   Resolved Ambulatory Problems    Diagnosis Date Noted  . Mass of omentum 04/05/2016  . Hirsutism 04/05/2016  . Mass of ovary 04/05/2016  . Renal cell carcinoma ([place]) 08/18/2016   Past Medical History:  Diagnosis Date  . Gout    . Heart rate fast   . Hypertension   . 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     Objective:   Physical Exam Vitals signs reviewed.  Constitutional:      Appearance: Normal appearance.  Cardiovascular:     Rate and Rhythm: Normal rate and regular rhythm.  Pulmonary:     Effort: Pulmonary effort is normal.     Breath sounds: Normal breath sounds.  Musculoskeletal:     Comments: Comes in on crutches. Not able to bear weight on right foot.  Entire right foot swollen and warm. Red appearance over great right toe.  Tenderness over proximal phalanx of great toe through 5th toe and 5th metatarsal.  Pain with ROM of right ankle.   Neurological:     General: No focal deficit present.     Mental Status: She is alert and oriented to person, place, and time.  Psychiatric:        Mood and Affect: Mood normal.           Assessment & Plan:  Marland KitchenMarland KitchenFaith was seen today for foot swelling.  Diagnoses and all orders for this visit:  Acute gout of right foot, unspecified cause -     colchicine 0.6 MG tablet; Take 2 tablets now and then one tablet 1 hour later if symptoms persist. -     Uric acid -     predniSONE (  DELTASONE) 20 MG tablet; Take 1 tablet (20 mg total) by mouth 2 (two) times daily with a meal. For 5 days.  Right foot pain -     colchicine 0.6 MG tablet; Take 2 tablets now and then one tablet 1 hour later if symptoms persist. -     Uric acid -     COMPLETE METABOLIC PANEL WITH GFR -     predniSONE (DELTASONE) 20 MG tablet; Take 1 tablet (20 mg total) by mouth 2 (two) times daily with a meal. For 5 days.  Personal history of gout -     colchicine 0.6 MG tablet; Take 2 tablets now and then one tablet 1 hour later if symptoms persist. -     Uric acid -     COMPLETE METABOLIC PANEL WITH GFR -     predniSONE (DELTASONE) 20 MG tablet; Take 1 tablet (20 mg total) by mouth 2 (two) times daily with a meal. For 5 days.  Great toe pain, right -      colchicine 0.6 MG tablet; Take 2 tablets now and then one tablet 1 hour later if symptoms persist. -     Uric acid -     predniSONE (DELTASONE) 20 MG tablet; Take 1 tablet (20 mg total) by mouth 2 (two) times daily with a meal. For 5 days.  Elevated uric acid in blood -     colchicine 0.6 MG tablet; Take 2 tablets now and then one tablet 1 hour later if symptoms persist.   Will get uric acid and cmp.   Suspect gout. Pt is not on preventative. Could be something she is eating. Gave list of foods to avoid.  I would like for her to try colchicine first if affordable. 2 tablets now and repeat in 1 hour no longer than 3 days. If not able to afford or not tolerable then start prednisone.  If uric acid level is elevated as suspected then will consider adding allopurinol and colchine together for 3 months then drop off colchicine.   For now pt was written out of work through Wednesday. May need to do the whole week.

## 2019-01-25 ENCOUNTER — Encounter: Payer: Self-pay | Admitting: Physician Assistant

## 2019-01-25 DIAGNOSIS — E790 Hyperuricemia without signs of inflammatory arthritis and tophaceous disease: Secondary | ICD-10-CM | POA: Insufficient documentation

## 2019-01-25 DIAGNOSIS — Z8739 Personal history of other diseases of the musculoskeletal system and connective tissue: Secondary | ICD-10-CM | POA: Insufficient documentation

## 2019-01-25 DIAGNOSIS — M109 Gout, unspecified: Secondary | ICD-10-CM | POA: Insufficient documentation

## 2019-01-25 LAB — COMPLETE METABOLIC PANEL WITH GFR
AG Ratio: 1.3 (calc) (ref 1.0–2.5)
ALT: 11 U/L (ref 6–29)
AST: 15 U/L (ref 10–35)
Albumin: 4.5 g/dL (ref 3.6–5.1)
Alkaline phosphatase (APISO): 67 U/L (ref 37–153)
BUN: 17 mg/dL (ref 7–25)
CO2: 26 mmol/L (ref 20–32)
Calcium: 10.2 mg/dL (ref 8.6–10.4)
Chloride: 100 mmol/L (ref 98–110)
Creat: 0.94 mg/dL (ref 0.50–0.99)
GFR, Est African American: 76 mL/min/{1.73_m2} (ref >=60)
GFR, Est Non African American: 65 mL/min/{1.73_m2} (ref >=60)
Globulin: 3.6 g/dL (calc) (ref 1.9–3.7)
Glucose, Bld: 88 mg/dL (ref 65–99)
Potassium: 4.3 mmol/L (ref 3.5–5.3)
Sodium: 138 mmol/L (ref 135–146)
Total Bilirubin: 0.7 mg/dL (ref 0.2–1.2)
Total Protein: 8.1 g/dL (ref 6.1–8.1)

## 2019-01-25 LAB — URIC ACID: Uric Acid, Serum: 9 mg/dL — ABNORMAL HIGH (ref 2.5–7.0)

## 2019-01-25 MED ORDER — COLCHICINE 0.6 MG PO TABS: ORAL_TABLET | ORAL | 0 refills | Status: DC

## 2019-01-25 MED ORDER — ALLOPURINOL 100 MG PO TABS: 100.0000 mg | ORAL_TABLET | Freq: Every day | ORAL | 6 refills | Status: DC

## 2019-01-25 NOTE — Progress Notes (Signed)
Yes uric acid is really elevated. Did you start colchine? After flare improves I think it would be benefical to start back on preventative treatment to keep uric acid levels down. Are you ok with this?

## 2019-01-25 NOTE — Addendum Note (Signed)
Addended by: Donella Stade on: 01/25/2019 04:28 PM   Modules accepted: Orders

## 2019-01-25 NOTE — Progress Notes (Signed)
Ok I am sending allopurinol 100mg  daily she will also take one tablet of colchicine for 3 months daily. After 3 months she will remain only on allopurinol.

## 2019-03-01 ENCOUNTER — Other Ambulatory Visit: Payer: Self-pay | Admitting: Physician Assistant

## 2019-03-02 ENCOUNTER — Other Ambulatory Visit: Payer: Self-pay | Admitting: Physician Assistant

## 2019-03-02 ENCOUNTER — Encounter: Payer: Self-pay | Admitting: Physician Assistant

## 2019-03-02 DIAGNOSIS — E782 Mixed hyperlipidemia: Secondary | ICD-10-CM

## 2019-03-08 ENCOUNTER — Other Ambulatory Visit: Payer: Self-pay | Admitting: Physician Assistant

## 2019-03-15 ENCOUNTER — Other Ambulatory Visit: Payer: Self-pay | Admitting: Neurology

## 2019-03-15 MED ORDER — METOPROLOL SUCCINATE ER 100 MG PO TB24: 100.0000 mg | ORAL_TABLET | Freq: Every day | ORAL | 1 refills | Status: DC

## 2019-03-15 NOTE — Telephone Encounter (Signed)
Patient needs Metoprolol XR 100 mg sent to pharmacy. It had been denied because it looked like it was discontinued back in February, but it looks like it was switched to 25 mg for a short time and caused a mix up. Okay to send?

## 2019-03-30 DIAGNOSIS — Z85528 Personal history of other malignant neoplasm of kidney: Secondary | ICD-10-CM | POA: Diagnosis not present

## 2019-04-05 ENCOUNTER — Other Ambulatory Visit: Payer: Self-pay

## 2019-04-05 ENCOUNTER — Ambulatory Visit (INDEPENDENT_AMBULATORY_CARE_PROVIDER_SITE_OTHER): Payer: Medicare Other | Admitting: Sports Medicine

## 2019-04-05 DIAGNOSIS — Z23 Encounter for immunization: Secondary | ICD-10-CM | POA: Diagnosis not present

## 2019-04-05 DIAGNOSIS — R0602 Shortness of breath: Secondary | ICD-10-CM | POA: Diagnosis not present

## 2019-04-05 DIAGNOSIS — Z85528 Personal history of other malignant neoplasm of kidney: Secondary | ICD-10-CM | POA: Diagnosis not present

## 2019-04-05 DIAGNOSIS — N200 Calculus of kidney: Secondary | ICD-10-CM | POA: Diagnosis not present

## 2019-04-10 ENCOUNTER — Ambulatory Visit: Payer: Medicare Other

## 2019-04-11 DIAGNOSIS — D49512 Neoplasm of unspecified behavior of left kidney: Secondary | ICD-10-CM | POA: Diagnosis not present

## 2019-04-11 DIAGNOSIS — Z85528 Personal history of other malignant neoplasm of kidney: Secondary | ICD-10-CM | POA: Diagnosis not present

## 2019-04-17 NOTE — Progress Notes (Deleted)
Subjective:   Bonnie Torres is a 63 y.o. female who presents for Medicare Annual (Subsequent) preventive examination.  Review of Systems:  No ROS.  Medicare Wellness Virtual Visit.  Visual/audio telehealth visit, UTA vital signs.   See social history for additional risk factors.      Sleep patterns:  Home Safety/Smoke Alarms: Feels safe in home. Smoke alarms in place.  Living environment; Seat Belt Safety/Bike Helmet: Wears seat belt.   Female:   Pap-  Aged out     Mammo- UTD      Dexa scan-        CCS- UTD      Objective:     Vitals: There were no vitals taken for this visit.  There is no height or weight on file to calculate BMI.  Advanced Directives 04/04/2018 12/29/2017 06/18/2016 06/12/2016 05/19/2016 04/30/2016 04/13/2016  Does Patient Have a Medical Advance Directive? _0  No No  Would patient like information on creating a medical advance directive? Yes (MAU/Ambulatory/Procedural Areas - Information given) No - Patient declined No - Patient declined No - Patient declined - No - patient declined information No - patient declined information    Tobacco Social History   Tobacco Use  Smoking Status Never Smoker  Smokeless Tobacco Never Used     Counseling given: Not Answered   Clinical Intake:                       Past Medical History:  Diagnosis Date  . Gout    1 yr ago right great toe  . Heart rate fast    tx Metoprolol  . Hyperlipidemia   . Hypertension   . 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 Type 2 diabetes mellitus without complication, without long-term current use of insulin ([facility]) 12/03/2017   Past Surgical History:  Procedure Laterality Date  . ABDOMINAL HYSTERECTOMY N/A 06/18/2016   Procedure: HYSTERECTOMY ABDOMINAL TOTAL;  Surgeon: Everitt Amber, MD;  Location: [facility];  Service: Gynecology;  Laterality: N/A;  . BREAST EXCISIONAL BIOPSY Right   . BREAST SURGERY Right    breast biopsy-benign  . MASS EXCISION N/A  06/18/2016   Procedure: RESECTION RETROPERITONEAL MASS WITH RETORPERITONEAL EXPLORATION ,LIGATION CHYLI CISTERNA,OPEN  CHOLECYSTECTOMY WITH MOBILATION HEPATIC FLEXURE;  Surgeon: Hall Busing, MD;  Location: [facility];  Service: General;  Laterality: N/A;  . PARTIAL NEPHRECTOMY Right 06/18/2016   Procedure: OPEN RIGHT NEPHRECTOMY PARTIAL;  Surgeon: Raynelle Bring, MD;  Location: [facility];  Service: Urology;  Laterality: Right;  . SALPINGOOPHORECTOMY Bilateral 06/18/2016   Procedure: BILATERAL SALPINGO OOPHORECTOMY;  Surgeon: Everitt Amber, MD;  Location: [facility];  Service: Gynecology;  Laterality: Bilateral;  . SHOULDER SURGERY Right 2010  . TOE SURGERY Right Hammer Toe   2004  . TONSILLECTOMY     Family History  Problem Relation Age of Onset  . Heart attack Paternal Aunt   . Hypertension Mother   . Diabetes Maternal Aunt   . Cancer Paternal Aunt        stomach  . Cancer Sister    Social History   Socioeconomic History  . Marital status: Widowed    Spouse name: Not on file  . Number of children: 1  . Years of education: 39  . Highest education level: 12th grade  Occupational History  . 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 Financial resource strain: Not hard at all  . Food  insecurity    Worry: Never true    Inability: Never true  . Transportation needs    Medical: No    Non-medical: No  Tobacco Use  . Smoking status: Never Smoker  . Smokeless tobacco: Never Used  Substance and Sexual Activity  . Alcohol use: No    Alcohol/week: 0.0 standard drinks  . Drug use: No  . Sexual activity: Not on file  Lifestyle  . Physical activity    Days per week: 0 days    Minutes per session: 0 min  . Stress: Not at all  Relationships  . Social connections    Talks on phone: More than three times a week    Gets together: More than three times a week    Attends religious service: More than 4 times per year    Active member of club or organization: No     Attends meetings of clubs or organizations: Never    Relationship status: Widowed  Other Topics Concern  . Not on file  Social History Narrative  . Not on file    Outpatient Encounter Medications as of 04/18/2019  Medication Sig  . allopurinol (ZYLOPRIM) 100 MG tablet Take 1 tablet (100 mg total) by mouth daily.  . Blood Glucose Monitoring Suppl (ONETOUCH VERIO FLEX SYSTEM) w/Device KIT Check fasting blood sugar every morning. DM ICD10 E11.9  . calcium-vitamin D (OSCAL WITH D) 250-125 MG-UNIT tablet Take 1 tablet by mouth daily.  . colchicine 0.6 MG tablet Take 2 tablets now and then one tablet 1 hour later if symptoms persist for gout flare. Take one tablet daily for 3 months while taking alloupurinol then stop.  . cyclobenzaprine (FLEXERIL) 10 MG tablet Take 1 tablet (10 mg total) by mouth 3 (three) times daily as needed for muscle spasms.  [name] glucose blood (CONTOUR NEXT TEST) test strip Use to test blood sugars twice daily  . meloxicam (MOBIC) 15 MG tablet Take 1 tablet (15 mg total) by mouth daily.  . metFORMIN (GLUCOPHAGE-XR) 750 MG 24 hr tablet TAKE 1 TABLET(750 MG) BY MOUTH DAILY WITH BREAKFAST  . metoprolol succinate (TOPROL-XL) 100 MG 24 hr tablet Take 1 tablet (100 mg total) by mouth daily.  [name] MICROLET LANCETS MISC Use to check blood sugars twice daily  . Olmesartan-amLODIPine-HCTZ 20-5-12.5 MG TABS Take 1 tablet by mouth daily.  Glory Rosebush VERIO test strip CHECK FASTING BLOOD SUGAR EVERY MORNING  . potassium chloride SA (K-DUR) 20 MEQ tablet Take one tablet daily.  . pravastatin (PRAVACHOL) 40 MG tablet TAKE 1 TABLET BY MOUTH DAILY NEEDS LABS  . predniSONE (DELTASONE) 20 MG tablet Take 1 tablet (20 mg total) by mouth 2 (two) times daily with a meal. For 5 days.  [name] tretinoin (RETIN-A) 0.025 % cream Apply topically at bedtime.  [name] zolpidem (AMBIEN) 10 MG tablet Take 1 tablet (10 mg total) by mouth at bedtime as needed for sleep.   No facility-administered encounter medications on  file as of 04/18/2019.     Activities of Daily Living No flowsheet data found.  Patient Care Team: Lavada Mesi as PCP - General (Family Medicine)    Assessment:   This is a routine wellness examination for Therma.Physical assessment deferred to PCP.   Exercise Activities and Dietary recommendations   Diet  Breakfast: Lunch:  Dinner:       Goals    . Exercise 3x per week (30 min per time)     Start exercising at least 3 times a  week for 30 minutes a day.       Fall Risk Fall Risk  04/04/2018 12/29/2017 04/13/2016  Falls in the past year? No No No   Is the patient's home free of loose throw rugs in walkways, pet beds, electrical cords, etc?   {Blank single:19197::"yes","no"}      Grab bars in the bathroom? {Blank single:19197::"yes","no"}      Handrails on the stairs?   {Blank single:19197::"yes","no"}      Adequate lighting?   {Blank single:19197::"yes","no"}   Depression Screen PHQ 2/9 Scores 12/26/2018 06/28/2018 04/04/2018 12/29/2017  PHQ - 2 Score 0 0 0 0  PHQ- 9 Score 0 0 - -     Cognitive Function     6CIT Screen 04/04/2018  What Year? 0 points  What month? 0 points  What time? 0 points  Count back from 20 0 points  Months in reverse 0 points  Repeat phrase 0 points  Total Score 0    Immunization History  Administered Date(s) Administered  . Influenza,inj,Quad PF,6+ Mos 04/15/2015, 03/23/2016, 03/31/2017, 03/07/2018, 04/05/2019  . Influenza-Unspecified 04/28/2011, 05/29/2013  . PPD Test 02/16/2018  . Pneumococcal Polysaccharide-23 12/01/2017  . Tdap 12/09/2010, 10/12/2014  . Zoster Recombinat (Shingrix) 12/26/2018    Screening Tests Health Maintenance  Topic Date Due  . FOOT EXAM  02/17/2019  . MAMMOGRAM  05/28/2019  . HEMOGLOBIN A1C  06/27/2019  . OPHTHALMOLOGY EXAM  01/04/2020  . Fecal DNA (Cologuard)  04/13/2020  . TETANUS/TDAP  10/11/2024  . INFLUENZA VACCINE  Completed  . PNEUMOCOCCAL POLYSACCHARIDE VACCINE AGE 54-64 HIGH RISK   Completed  . Hepatitis C Screening  Completed  . HIV Screening  Completed         Plan:   ***   I have personally reviewed and noted the following in the patient's chart:   . Medical and social history . Use of alcohol, tobacco or illicit drugs  . Current medications and supplements . Functional ability and status . Nutritional status . Physical activity . Advanced directives . List of other physicians . Hospitalizations, surgeries, and ER visits in previous 12 months . Vitals . Screenings to include cognitive, depression, and falls . Referrals and appointments  In addition, I have reviewed and discussed with patient certain preventive protocols, quality metrics, and best practice recommendations. A written personalized care plan for preventive services as well as general preventive health recommendations were provided to patient.     Joanne Chars, LPN  22/17/9810

## 2019-04-18 ENCOUNTER — Ambulatory Visit: Payer: Medicare Other

## 2019-04-24 NOTE — Progress Notes (Signed)
Subjective:   LOVENIA DEBRULER is a 63 y.o. female who presents for Medicare Annual (Subsequent) preventive examination.  Review of Systems:  No ROS.  Medicare Wellness Virtual Visit.  Visual/audio telehealth visit, UTA vital signs.   See social history for additional risk factors.    Cardiac Risk Factors include: advanced age (>39mn, >>26women);diabetes mellitus;hypertension;sedentary lifestyle Sleep patterns: Getting on average 5-6 hours a night. Does not wake to void during the night. Wakes up and feels rested and ready for the day. Home Safety/Smoke Alarms: Feels safe in home. Smoke alarms in place.  Living environment; Lives with daughter in a 1 story home. No steps in the house or around the house. Shower is a combo tub and has a anti slip mat in place. Seat Belt Safety/Bike Helmet: Wears seat belt.   Female:   Pap-  Aged out     Mammo-  UTD     Dexa scan-  Not eligible due to age     C43 UTD        Objective:     Vitals: BP 120/62   Pulse 76   Temp 98.3 F (36.8 C) (Oral)   Ht _0  (1.499 m)   Wt 167 lb (75.8 kg)   SpO2 98%   BMI 33.73 kg/m   Body mass index is 33.73 kg/m.  Advanced Directives 04/25/2019 04/04/2018 12/29/2017 06/18/2016 06/12/2016 05/19/2016 04/30/2016  Does Patient Have a Medical Advance Directive? _1  No No  Would patient like information on creating a medical advance directive? No - Patient declined Yes (MAU/Ambulatory/Procedural Areas - Information given) No - Patient declined No - Patient declined No - Patient declined - No - patient declined information    Tobacco Social History   Tobacco Use  Smoking Status Never Smoker  Smokeless Tobacco Never Used     Counseling given: Not Answered   Clinical Intake:  Pre-visit preparation completed: Yes  Pain : No/denies pain     Nutritional Risks: None Diabetes: Yes CBG done?: No Did pt. bring in CBG monitor from home?: No  How often do you need to have someone help  you when you read instructions, pamphlets, or other written materials from your doctor or pharmacy?: 1 - Never What is the last grade level you completed in school?: 12  Interpreter Needed?: No  Information entered by :: KOrlie Dakin LPN  Past Medical History:  Diagnosis Date  . Gout    1 yr ago right great toe  . Heart rate fast    tx Metoprolol  . Hyperlipidemia   . Hypertension   . Retroperitoneal [place] ([facility] 06/18/2016  . Type 2 diabetes mellitus without complication, without long-term current use of insulin ([facility] 12/03/2017   Past Surgical History:  Procedure Laterality Date  . ABDOMINAL HYSTERECTOMY N/A 06/18/2016   Procedure: HYSTERECTOMY ABDOMINAL TOTAL;  Surgeon: EEveritt Amber MD;  [facility];  Service: Gynecology;  Laterality: N/A;  . BREAST EXCISIONAL BIOPSY Right   . BREAST SURGERY Right    breast biopsy-benign  . MASS EXCISION N/A 06/18/2016   Procedure: RESECTION RETROPERITONEAL MASS WITH RETORPERITONEAL EXPLORATION ,LIGATION CHYLI CISTERNA,OPEN  CHOLECYSTECTOMY WITH MOBILATION HEPATIC FLEXURE;  Surgeon: THall Busing MD;  Location: [facility];  Service: General;  Laterality: N/A;  . PARTIAL NEPHRECTOMY Right 06/18/2016   Procedure: OPEN RIGHT NEPHRECTOMY PARTIAL;  Surgeon: LRaynelle Bring MD;  [facility];  Service: Urology;  Laterality: Right;  . SALPINGOOPHORECTOMY Bilateral 06/18/2016   Procedure: BILATERAL  SALPINGO OOPHORECTOMY;  Surgeon: Everitt Amber, MD;  Location[facility];  Service: Gynecology;  Laterality: Bilateral;  . SHOULDER SURGERY Right 2010  . TOE SURGERY Right Hammer Toe   2004  . TONSILLECTOMY     Family History  Problem Relation Age of Onset  . Heart attack Paternal Aunt   . Hypertension Mother   . Diabetes Maternal Aunt   . Cancer Paternal Aunt        stomach  . Cancer Sister    Social History   Socioeconomic History  . Marital status: Widowed    Spouse name: Not on file  . Number of children: 1  . Years of education: 7  .  Highest education level: 12th grade  Occupational History  . 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 Financial resource strain: Not hard at all  . Food insecurity    Worry: Never true    Inability: Never true  . Transportation needs    Medical: No    Non-medical: No  Tobacco Use  . Smoking status: Never Smoker  . Smokeless tobacco: Never Used  Substance and Sexual Activity  . Alcohol use: No    Alcohol/week: 0.0 standard drinks  . Drug use: No  . Sexual activity: Not Currently  Lifestyle  . Physical activity    Days per week: 0 days    Minutes per session: 0 min  . Stress: Not at all  Relationships  . Social connections    Talks on phone: More than three times a week    Gets together: Never    Attends religious service: More than 4 times per year    Active member of club or organization: No    Attends meetings of clubs or organizations: Never    Relationship status: Widowed  Other Topics Concern  . Not on file  Social History Narrative   Semi retired- still working at school    Outpatient Encounter Medications as of 04/25/2019  Medication Sig  . allopurinol (ZYLOPRIM) 100 MG tablet Take 1 tablet (100 mg total) by mouth daily.  . Blood Glucose Monitoring Suppl (ONETOUCH VERIO FLEX SYSTEM) w/Device KIT Check fasting blood sugar every morning. DM ICD10 E11.9  . calcium-vitamin D (OSCAL WITH D) 250-125 MG-UNIT tablet Take 1 tablet by mouth daily.  . colchicine 0.6 MG tablet Take 2 tablets now and then one tablet 1 hour later if symptoms persist for gout flare. Take one tablet daily for 3 months while taking alloupurinol then stop.  . cyclobenzaprine (FLEXERIL) 10 MG tablet Take 1 tablet (10 mg total) by mouth 3 (three) times daily as needed for muscle spasms.  [name] glucose blood (CONTOUR NEXT TEST) test strip Use to test blood sugars twice daily  . meloxicam (MOBIC) 15 MG tablet Take 1 tablet (15 mg total) by mouth daily.  . metFORMIN  (GLUCOPHAGE-XR) 750 MG 24 hr tablet TAKE 1 TABLET(750 MG) BY MOUTH DAILY WITH BREAKFAST  . metoprolol succinate (TOPROL-XL) 100 MG 24 hr tablet Take 1 tablet (100 mg total) by mouth daily.  [name] MICROLET LANCETS MISC Use to check blood sugars twice daily  . Olmesartan-amLODIPine-HCTZ 20-5-12.5 MG TABS Take 1 tablet by mouth daily.  Glory Rosebush VERIO test strip CHECK FASTING BLOOD SUGAR EVERY MORNING  . potassium chloride SA (K-DUR) 20 MEQ tablet Take one tablet daily.  . pravastatin (PRAVACHOL) 40 MG tablet TAKE 1 TABLET BY MOUTH DAILY NEEDS LABS  . predniSONE (DELTASONE) 20 MG tablet  Take 1 tablet (20 mg total) by mouth 2 (two) times daily with a meal. For 5 days.  [name] tretinoin (RETIN-A) 0.025 % cream Apply topically at bedtime.  [name] zolpidem (AMBIEN) 10 MG tablet Take 1 tablet (10 mg total) by mouth at bedtime as needed for sleep.   No facility-administered encounter medications on file as of 04/25/2019.     Activities of Daily Living In your present state of health, do you have any difficulty performing the following activities: 04/25/2019  Hearing? N  Vision? N  Difficulty concentrating or making decisions? N  Walking or climbing stairs? N  Dressing or bathing? N  Doing errands, shopping? N  Preparing Food and eating ? N  Using the Toilet? N  In the past six months, have you accidently leaked urine? N  Do you have problems with loss of bowel control? N  Managing your Medications? N  Managing your Finances? N  Housekeeping or managing your Housekeeping? N  Some recent data might be hidden    Patient Care Team: Lavada Mesi as PCP - General (Family Medicine)    Assessment:   This is a routine wellness examination for Am[place].Physical assessment deferred to PCP.   Exercise Activities and Dietary recommendations Current Exercise Habits: The patient does not participate in regular exercise at present, Exercise limited by: None identified Diet Eats a fairly healthy diet of  fruits and vegetables Breakfast: biscuit Lunch:  cereal Dinner: Meat and vegetables       Goals    . Exercise 3x per week (30 min per time)     Start exercising at least 3 times a week for 30 minutes a day.    . Patient Stated     Patient stated needss to change her eating habits and start eating better       Fall Risk Fall Risk  04/25/2019 04/04/2018 12/29/2017 04/13/2016  Falls in the past year? 0 No No No  Number falls in past yr: 0 - - -  Injury with Fall? 0 - - -  Follow up Falls prevention discussed - - -   Is the patient's home free of loose throw rugs in walkways, pet beds, electrical cords, etc?   yes      Grab bars in the bathroom? no      Handrails on the stairs?   no      Adequate lighting?   yes   Depression Screen PHQ 2/9 Scores 04/25/2019 12/26/2018 06/28/2018 04/04/2018  PHQ - 2 Score 0 0 0 0  PHQ- 9 Score - 0 0 -     Cognitive Function     6CIT Screen 04/25/2019 04/04/2018  What Year? 0 points 0 points  What month? 0 points 0 points  What time? 0 points 0 points  Count back from 20 0 points 0 points  Months in reverse 0 points 0 points  Repeat phrase 0 points 0 points  Total Score 0 0    Immunization History  Administered Date(s) Administered  . Influenza,inj,Quad PF,6+ Mos 04/15/2015, 03/23/2016, 03/31/2017, 03/07/2018, 04/05/2019  . Influenza-Unspecified 04/28/2011, 05/29/2013  . PPD Test 02/16/2018  . Pneumococcal Polysaccharide-23 12/01/2017  . Tdap 12/09/2010, 10/12/2014  . Zoster Recombinat (Shingrix) 12/26/2018    Screening Tests Health Maintenance  Topic Date Due  . FOOT EXAM  02/17/2019  . MAMMOGRAM  05/28/2019  . HEMOGLOBIN A1C  06/27/2019  . OPHTHALMOLOGY EXAM  01/04/2020  . Fecal DNA (Cologuard)  04/13/2020  . TETANUS/TDAP  10/11/2024  .  INFLUENZA VACCINE  Completed  . PNEUMOCOCCAL POLYSACCHARIDE VACCINE AGE 54-64 HIGH RISK  Completed  . Hepatitis C Screening  Completed  . HIV Screening  Completed        Plan:    Please  schedule your next medicare wellness visit with me in 1 yr.  Ms. Tanabe , Thank you for taking time to come for your Medicare Wellness Visit. I appreciate your ongoing commitment to your health goals. Please review the following plan we discussed and let me know if I can assist you in the future.  Continue doing brain stimulating activities (puzzles, reading, adult coloring books, staying active) to keep memory sharp.    These are the goals we discussed: Goals    . Exercise 3x per week (30 min per time)     Start exercising at least 3 times a week for 30 minutes a day.    . Patient Stated     Patient stated needss to change her eating habits and start eating better       This is a list of the screening recommended for you and due dates:  Health Maintenance  Topic Date Due  . Complete foot exam   02/17/2019  . Mammogram  05/28/2019  . Hemoglobin A1C  06/27/2019  . Eye exam for diabetics  01/04/2020  . Cologuard (Stool DNA test)  04/13/2020  . Tetanus Vaccine  10/11/2024  . Flu Shot  Completed  . Pneumococcal vaccine  Completed  .  Hepatitis C: One time screening is recommended by Center for Disease Control  (CDC) for  adults born from [birth year] through 1963.   Completed  . HIV Screening  Completed      I have personally reviewed and noted the following in the patient's chart:   . Medical and social history . Use of alcohol, tobacco or illicit drugs  . Current medications and supplements . Functional ability and status . Nutritional status . Physical activity . Advanced directives . List of other physicians . Hospitalizations, surgeries, and ER visits in previous 12 months . Vitals . Screenings to include cognitive, depression, and falls . Referrals and appointments  In addition, I have reviewed and discussed with patient certain preventive protocols, quality metrics, and best practice recommendations. A written personalized care plan for preventive services as well as  general preventive health recommendations were provided to patient.     Joanne Chars, LPN  31/43/[id number]

## 2019-04-25 ENCOUNTER — Ambulatory Visit (INDEPENDENT_AMBULATORY_CARE_PROVIDER_SITE_OTHER): Payer: Medicare Other | Admitting: *Deleted

## 2019-04-25 ENCOUNTER — Other Ambulatory Visit: Payer: Self-pay

## 2019-04-25 VITALS — BP 120/62 | HR 76 | Temp 98.3°F | Ht 59.0 in | Wt 167.0 lb

## 2019-04-25 DIAGNOSIS — Z0000 Encounter for general adult medical examination without abnormal findings: Secondary | ICD-10-CM | POA: Diagnosis not present

## 2019-04-25 NOTE — Patient Instructions (Signed)
Please schedule your next medicare wellness visit with me in 1 yr.  Bonnie Torres , Thank you for taking time to come for your Medicare Wellness Visit. I appreciate your ongoing commitment to your health goals. Please review the following plan we discussed and let me know if I can assist you in the future.  Continue doing brain stimulating activities (puzzles, reading, adult coloring books, staying active) to keep memory sharp.  These are the goals we discussed: Goals    . Exercise 3x per week (30 min per time)     Start exercising at least 3 times a week for 30 minutes a day.    . Patient Stated     Patient stated needss to change her eating habits and start eating better

## 2019-06-20 ENCOUNTER — Encounter: Payer: Self-pay | Admitting: Physician Assistant

## 2019-06-20 ENCOUNTER — Ambulatory Visit: Payer: Medicare Other | Admitting: Physician Assistant

## 2019-06-20 ENCOUNTER — Ambulatory Visit (INDEPENDENT_AMBULATORY_CARE_PROVIDER_SITE_OTHER): Payer: Medicare Other | Admitting: Physician Assistant

## 2019-06-20 VITALS — BP 117/85 | HR 83 | Ht 59.0 in | Wt 167.0 lb

## 2019-06-20 DIAGNOSIS — I10 Essential (primary) hypertension: Secondary | ICD-10-CM

## 2019-06-20 DIAGNOSIS — E782 Mixed hyperlipidemia: Secondary | ICD-10-CM

## 2019-06-20 DIAGNOSIS — E119 Type 2 diabetes mellitus without complications: Secondary | ICD-10-CM | POA: Diagnosis not present

## 2019-06-20 DIAGNOSIS — Z1231 Encounter for screening mammogram for malignant neoplasm of breast: Secondary | ICD-10-CM | POA: Diagnosis not present

## 2019-06-20 MED ORDER — METOPROLOL SUCCINATE ER 100 MG PO TB24: 100.0000 mg | ORAL_TABLET | Freq: Every day | ORAL | 1 refills | Status: DC

## 2019-06-20 MED ORDER — OLMESARTAN-AMLODIPINE-HCTZ 20-5-12.5 MG PO TABS: 1.0000 | ORAL_TABLET | Freq: Every day | ORAL | 1 refills | Status: DC

## 2019-06-20 MED ORDER — PRAVASTATIN SODIUM 40 MG PO TABS: ORAL_TABLET | ORAL | 3 refills | Status: DC

## 2019-06-20 MED ORDER — METFORMIN HCL ER 750 MG PO TB24: ORAL_TABLET | ORAL | 1 refills | Status: DC

## 2019-06-20 NOTE — Progress Notes (Signed)
Numbness in pinky finger on both sides, started on left, has been going on 2-4 weeks Last blood sugar reading this morning 123, fluctuates between 103-123 No other issues

## 2019-06-20 NOTE — Progress Notes (Signed)
..Virtual Visit via Video Note  I connected with Bonnie Torres on 06/20/19 at  8:50 AM EST by a video enabled telemedicine application and verified that I am speaking with the correct person using two identifiers.  Location: Patient: home Provider:home   I discussed the limitations of evaluation and management by telemedicine and the availability of in person appointments. The patient expressed understanding and agreed to proceed.  History of Present Illness: Pt is a 63 yo female with T2DM, HTN, HLD who calls into the clinic for medication refills.   Pt is doing great. She is taking her metformin. She denies exercising but trying to limit sugars and carbs. She continues to work. No hypoglycemic events. No open sores or wounds.   Pt denies any CP, palpitations, headaches. She is compliant with medications.   .. Active Ambulatory Problems    Diagnosis Date Noted  . Insomnia 08/31/2014  . Essential hypertension, benign 08/31/2014  . Hyperlipidemia 09/28/2014  . Bilateral low back pain without sciatica 04/17/2015  . Primary gout 10/16/2015  . Breast calcification, right 11/13/2015  . Elevated testosterone level in female 03/25/2016  . Mass of kidney 04/05/2016  . Uterine mass 04/27/2016  . Lymphangioma 06/18/2016  . Hot flashes due to surgical menopause 08/19/2016  . Obesity (BMI 30.0-34.9) 09/16/2016  . Hypokalemia 04/02/2017  . Anterior cervical adenopathy 05/19/2017  . Trigger middle finger of left hand 12/01/2017  . Type 2 diabetes mellitus without complication, without long-term current use of insulin ([place]) 12/03/2017  . Class 1 obesity due to excess calories without serious comorbidity with body mass index (BMI) of 33.0 to 33.9 in adult 12/03/2017  . Elevated serum creatinine 12/07/2017  . Irregular heart rate 04/06/2018  . Hypotension 04/06/2018  . Mid back pain on left side 07/29/2018  . Personal history of gout 01/25/2019  . Acute gout of right foot 01/25/2019  .  Elevated uric acid in blood 01/25/2019   Resolved Ambulatory Problems    Diagnosis Date Noted  . Mass of omentum 04/05/2016  . Hirsutism 04/05/2016  . Mass of ovary 04/05/2016  . Renal cell carcinoma ([place]) 08/18/2016   Past Medical History:  Diagnosis Date  . Gout   . Heart rate fast   . Hypertension   . 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     Observations/Objective: No acute distress. Normal breathing.  Normal mood.  .. Today's Vitals   06/20/19 0806  BP: 117/85  Pulse: 83  Weight: 167 lb (75.8 kg)  Height: 4\' 11"  (1.499 m)   Body mass index is 33.73 kg/m.  .. Depression screen [facility] 2/9 04/25/2019 12/26/2018 06/28/2018 04/04/2018 12/29/2017  Decreased Interest 0 0 0 0 0  Down, Depressed, Hopeless 0 0 0 0 0  PHQ - 2 Score 0 0 0 0 0  Altered sleeping - 0 0 - -  Tired, decreased energy - 0 0 - -  Change in appetite - 0 0 - -  Feeling bad or failure about yourself  - 0 0 - -  Trouble concentrating - 0 0 - -  Moving slowly or fidgety/restless - 0 0 - -  Suicidal thoughts - 0 0 - -  PHQ-9 Score - 0 0 - -  Difficult doing work/chores - Not difficult at all Not difficult at all - -      Assessment and Plan: Bonnie KitchenMarland KitchenFaith was seen today for follow-up.  Diagnoses and all orders for this visit:  Type 2 diabetes mellitus  without complication, without long-term current use of insulin (HCC) -     metFORMIN (GLUCOPHAGE-XR) 750 MG 24 hr tablet; TAKE 1 TABLET(750 MG) BY MOUTH DAILY WITH BREAKFAST -     Hemoglobin A1c -     COMPLETE METABOLIC PANEL WITH GFR  Mixed hyperlipidemia -     pravastatin (PRAVACHOL) 40 MG tablet; TAKE 1 TABLET BY MOUTH DAILY. -     Lipid Panel w/reflex Direct LDL -     COMPLETE METABOLIC PANEL WITH GFR  Essential hypertension, benign -     metoprolol succinate (TOPROL-XL) 100 MG 24 hr tablet; Take 1 tablet (100 mg total) by mouth daily. -     Olmesartan-amLODIPine-HCTZ 20-5-12.5 MG TABS; Take 1 tablet by  mouth daily. -     COMPLETE METABOLIC PANEL WITH GFR  Visit for screening mammogram -     MM 3D SCREEN BREAST BILATERAL   Pt doing great.  Needs labs. Ordered today.  Needs mammogram. Ordered today.  Refilled medications.  BP looks great.  On statin. On ARB.  Follow up in 6 months.    Follow Up Instructions:    I discussed the assessment and treatment plan with the patient. The patient was provided an opportunity to ask questions and all were answered. The patient agreed with the plan and demonstrated an understanding of the instructions.   The patient was advised to call back or seek an in-person evaluation if the symptoms worsen or if the condition fails to improve as anticipated.   Iran Planas, PA-C

## 2019-06-27 ENCOUNTER — Ambulatory Visit: Payer: Medicare Other | Admitting: Physician Assistant

## 2019-07-01 ENCOUNTER — Other Ambulatory Visit: Payer: Self-pay | Admitting: Physician Assistant

## 2019-07-01 DIAGNOSIS — F5101 Primary insomnia: Secondary | ICD-10-CM

## 2019-07-03 NOTE — Telephone Encounter (Signed)
Sent!

## 2019-07-03 NOTE — Telephone Encounter (Signed)
Patient called and left vm asking for [place] to send medication.  Last filled 12/26/2018 #30 with 5 refills.  Last appt 06/20/2019.

## 2019-07-12 ENCOUNTER — Ambulatory Visit (INDEPENDENT_AMBULATORY_CARE_PROVIDER_SITE_OTHER): Payer: Medicare Other

## 2019-07-12 ENCOUNTER — Other Ambulatory Visit: Payer: Self-pay

## 2019-07-12 DIAGNOSIS — Z1231 Encounter for screening mammogram for malignant neoplasm of breast: Secondary | ICD-10-CM | POA: Diagnosis not present

## 2019-07-12 IMAGING — MG DIGITAL SCREENING BILAT W/ TOMO W/ CAD
6 of 10 series · 6 of 30 positions shown · non-contrast
Comparison: Previous exam(s).

CLINICAL DATA: Screening.

EXAM:
DIGITAL SCREENING BILATERAL MAMMOGRAM WITH TOMO AND CAD

[L MLO synth-2D (1 of 2)]
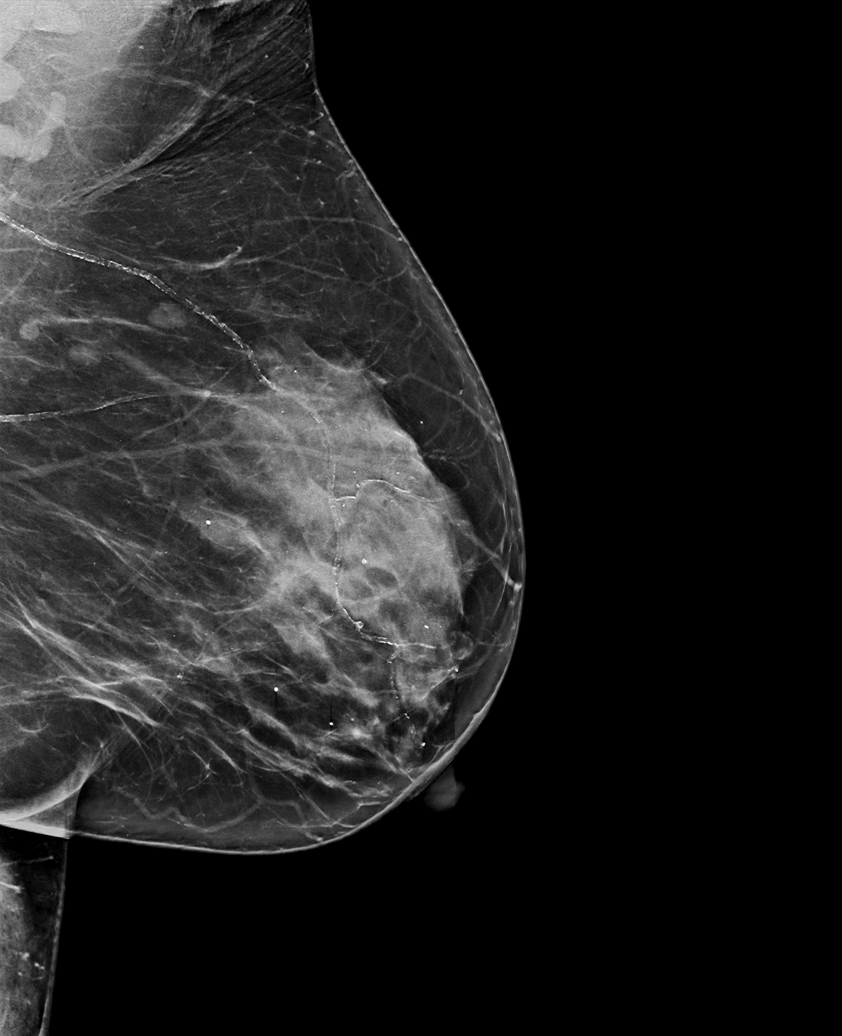

[R CC synth-2D]
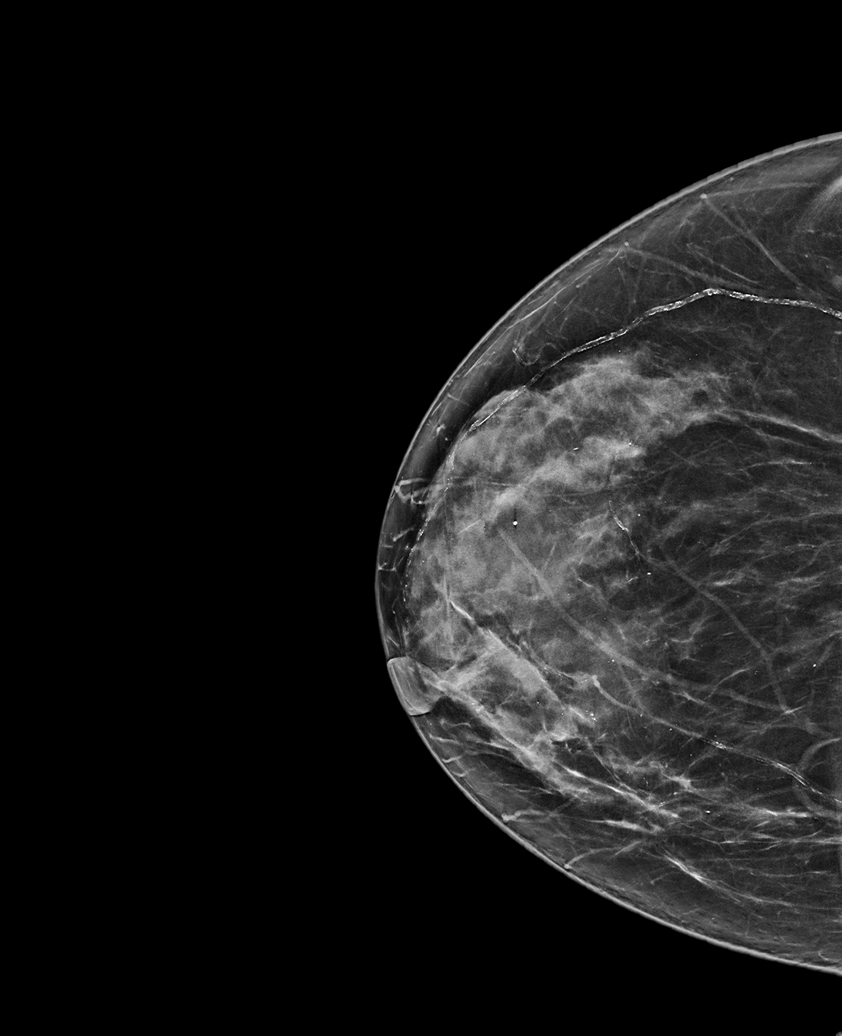

[R MLO synth-2D]
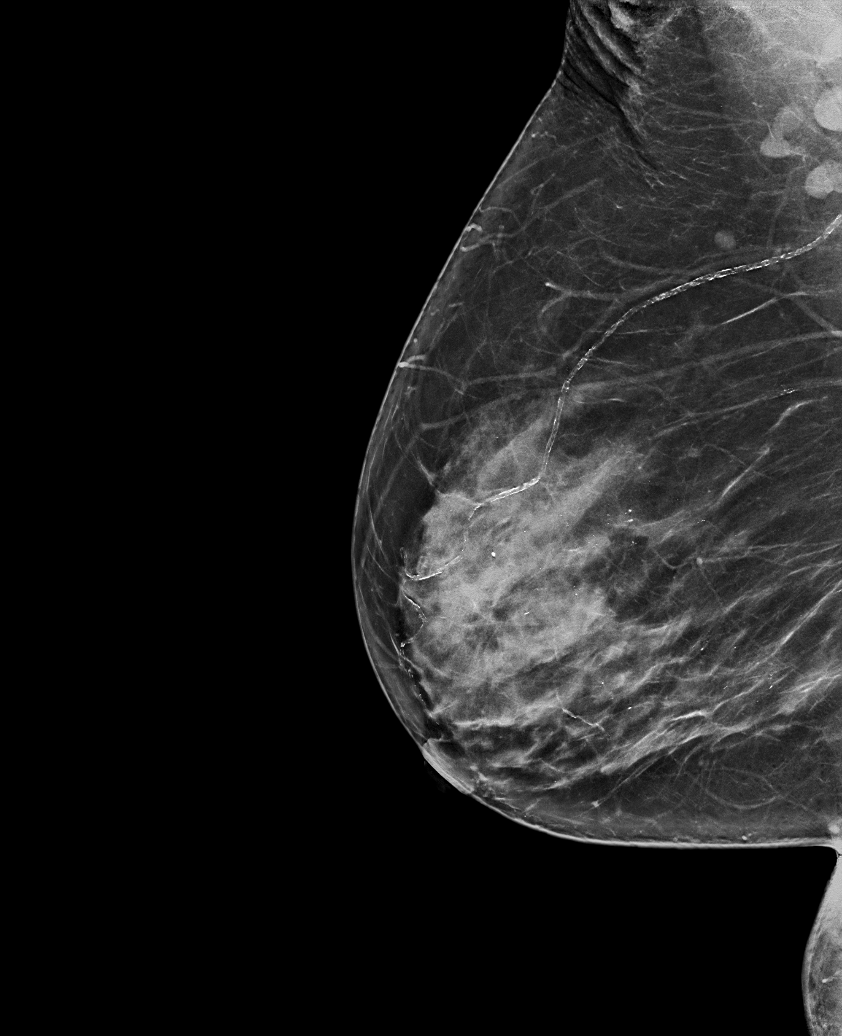

[L CC synth-2D]
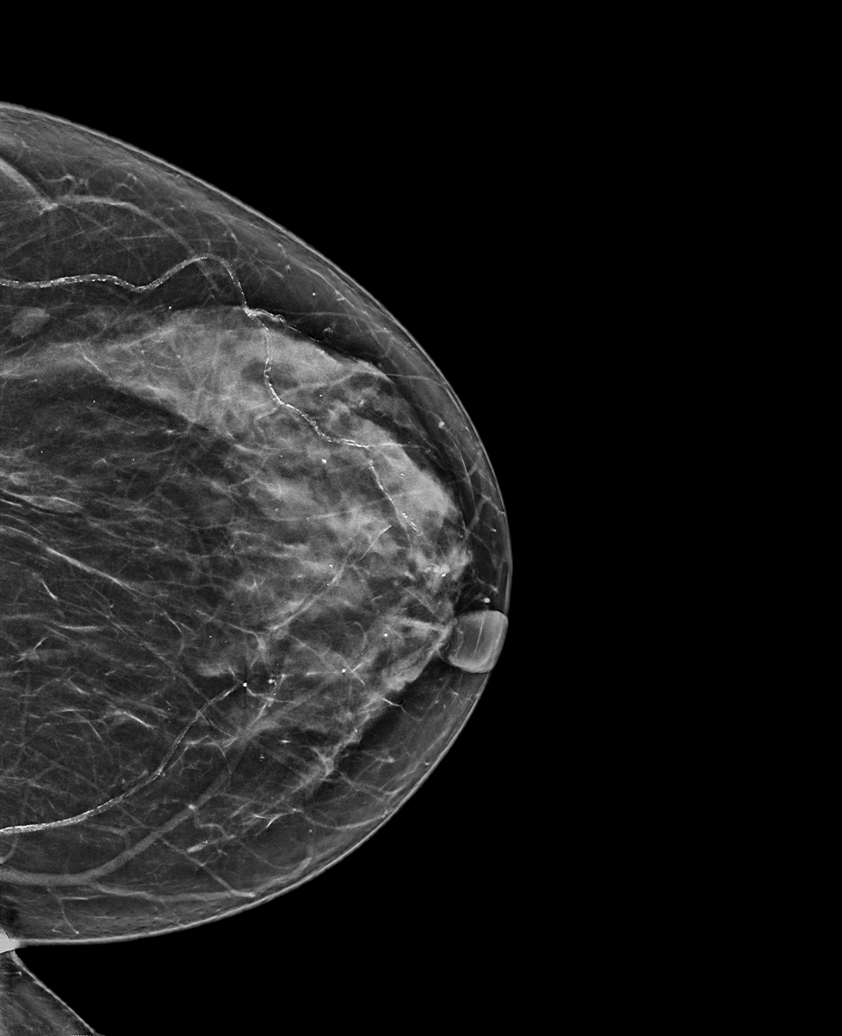

[L MLO synth-2D (2 of 2)]
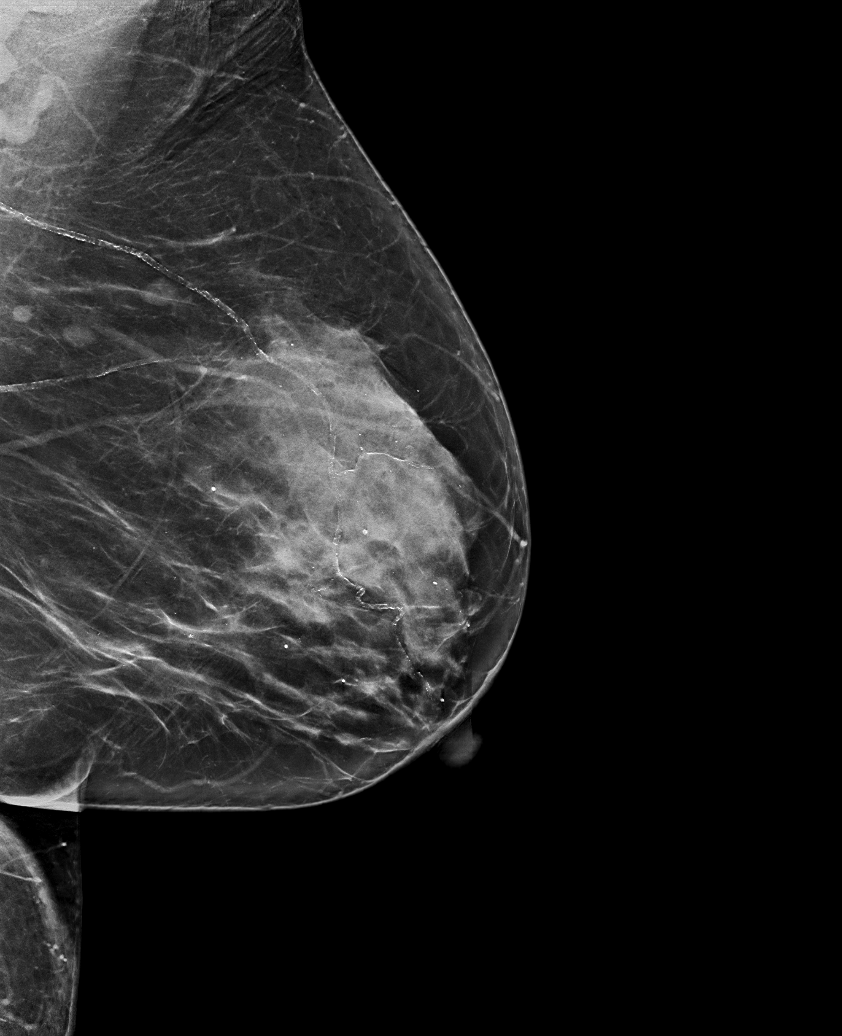

[L MLO tomo · tomo slice 41/82.0]
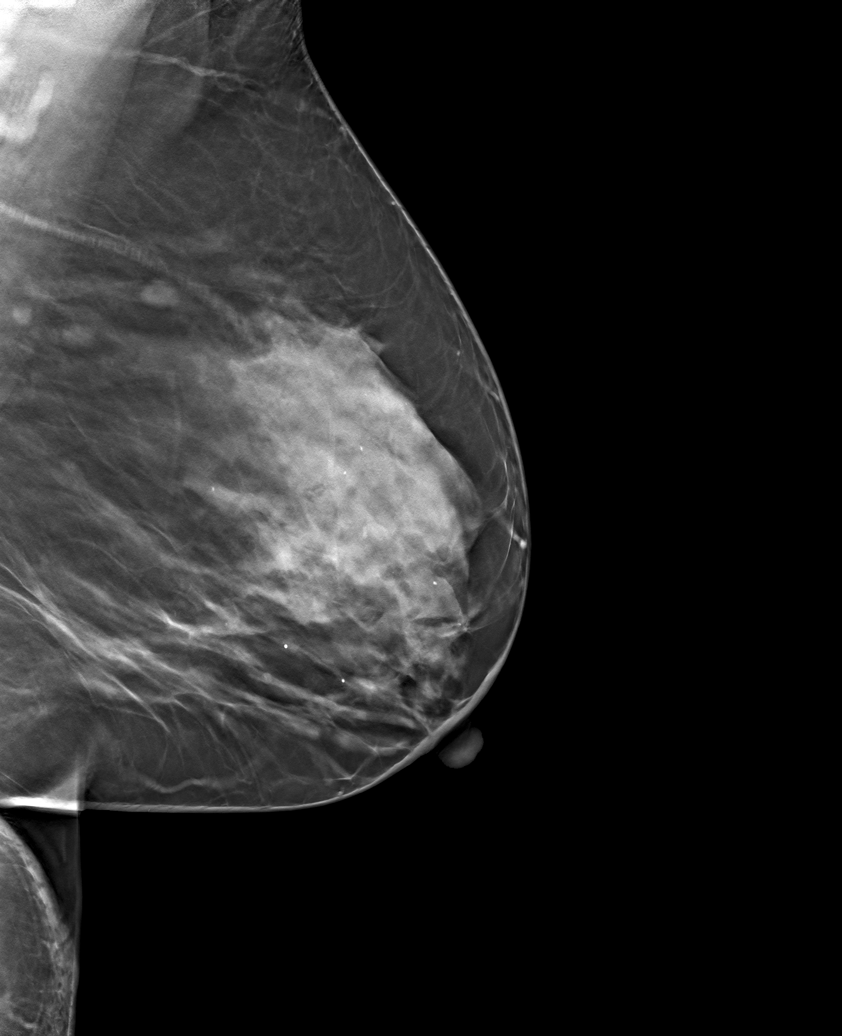

[6 of 30 positions shown; findings below may reference images not displayed]

ACR Breast Density Category c: The breast tissue is heterogeneously
dense, which may obscure small masses.
FINDINGS: There are no findings suspicious for malignancy. Images were
processed with CAD.
IMPRESSION: No mammographic evidence of malignancy. A result letter of this
screening mammogram will be mailed directly to the patient.

RECOMMENDATION:
Screening mammogram in one year. (Code:[5V])

BI-RADS CATEGORY  1: Negative.

## 2019-07-13 NOTE — Progress Notes (Signed)
Normal mammogram. Follow up in 1 year.

## 2019-09-04 ENCOUNTER — Other Ambulatory Visit: Payer: Self-pay | Admitting: Physician Assistant

## 2019-09-06 ENCOUNTER — Other Ambulatory Visit: Payer: Self-pay | Admitting: Physician Assistant

## 2019-09-06 DIAGNOSIS — E876 Hypokalemia: Secondary | ICD-10-CM

## 2019-09-14 ENCOUNTER — Other Ambulatory Visit: Payer: Self-pay | Admitting: Urology

## 2019-09-14 DIAGNOSIS — Z85528 Personal history of other malignant neoplasm of kidney: Secondary | ICD-10-CM

## 2019-09-14 DIAGNOSIS — D49512 Neoplasm of unspecified behavior of left kidney: Secondary | ICD-10-CM

## 2019-09-30 ENCOUNTER — Other Ambulatory Visit: Payer: Self-pay | Admitting: Physician Assistant

## 2019-10-04 DIAGNOSIS — Z85528 Personal history of other malignant neoplasm of kidney: Secondary | ICD-10-CM | POA: Diagnosis not present

## 2019-10-12 ENCOUNTER — Ambulatory Visit
Admission: RE | Admit: 2019-10-12 | Discharge: 2019-10-12 | Disposition: A | Discharge status: Home / Self Care | Payer: Medicare Other | Source: Ambulatory Visit | Attending: Urology | Admitting: Urology

## 2019-10-12 ENCOUNTER — Other Ambulatory Visit: Payer: Self-pay

## 2019-10-12 DIAGNOSIS — C649 Malignant neoplasm of unspecified kidney, except renal pelvis: Secondary | ICD-10-CM | POA: Diagnosis not present

## 2019-10-12 DIAGNOSIS — Z85528 Personal history of other malignant neoplasm of kidney: Secondary | ICD-10-CM

## 2019-10-12 DIAGNOSIS — D49512 Neoplasm of unspecified behavior of left kidney: Secondary | ICD-10-CM

## 2019-10-12 IMAGING — MR MR ABDOMEN WO/W CM
17 series · 48 of 48 positions shown · IV contrast (15ml multihance)
Comparison: [DATE]

CLINICAL DATA: Follow-up renal cell carcinoma.

EXAM:
MRI ABDOMEN WITHOUT AND WITH CONTRAST
TECHNIQUE: Multiplanar multisequence MR imaging of the abdomen was performed
both before and after the administration of intravenous contrast.
CONTRAST:  15mL MULTIHANCE GADOBENATE DIMEGLUMINE 529 MG/ML IV SOLN

[Series 2: T2 · coronal · 5.0mm · 1.04mm/px · 1 of 35 slices shown (1 of 3)]
[im 1/35]
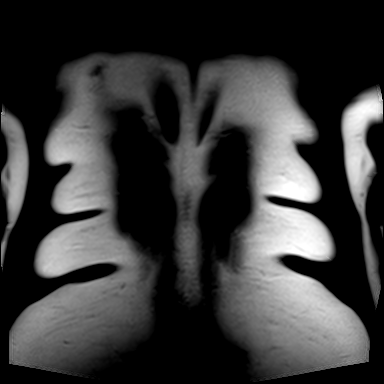

[Series 3: T2 · axial · 5.0mm · 0.66mm/px · 1 of 37 slices shown (2 of 3)]
[im 1/37]
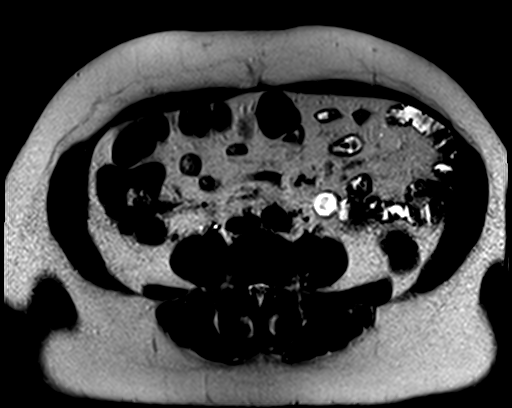

[Series 4: ep2d_diff_b50_500_800_p2_trig · axial · 6.0mm · 1.98mm/px · z∈[-51,+160]mm · 2 of 84 slices shown]
[im 1/84]
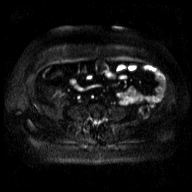
[im 84/84]
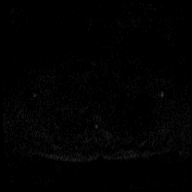

[Series 5: ep2d_[id number]_500_800_p2_trig_adc · axial · 6.0mm · 1.98mm/px · 1 of 28 slices shown]
[im 1/28]
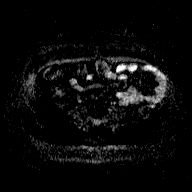

[Series 6: T2 · axial · 5.0mm · 1.77mm/px · 1 of 37 slices shown (3 of 3)]
[im 1/37]
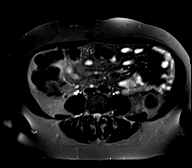

[Series 7: bSSFP · axial · 4.0mm · 0.74mm/px · 1 of 53 slices shown]
[im 1/53]
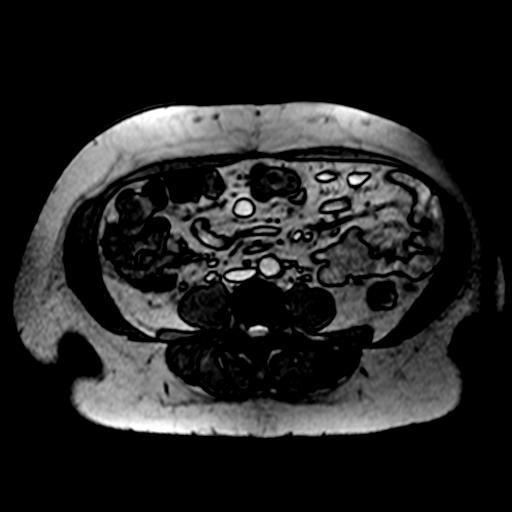

[Series 8: T1 · axial · 5.0mm · 0.89mm/px · z∈[-59,+169]mm · 2 of 78 slices shown]
[im 1/78]
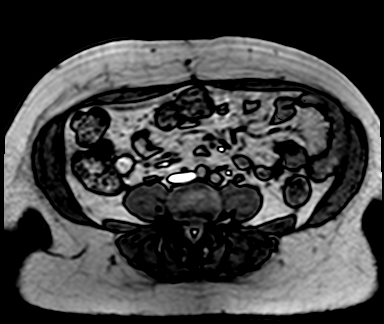
[im 78/78]
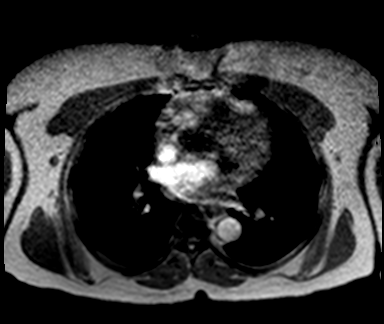

[Series 9: T1 dynamic · axial · non-contrast · 2.3mm · 1.41mm/px · z∈[-76,+161]mm · 4 of 104 slices shown]
[im 1/104]
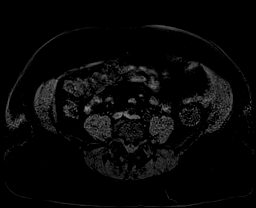
[im 35/104]
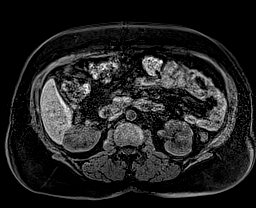
[im 69/104]
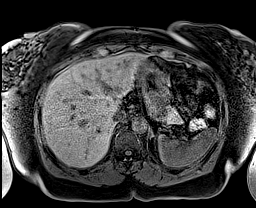
[im 104/104]
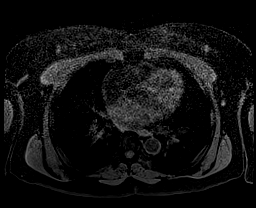

[Series 10: post 25 sec · axial · 2.3mm · 1.41mm/px · z∈[-76,+161]mm · 4 of 104 slices shown]
[im 1/104]
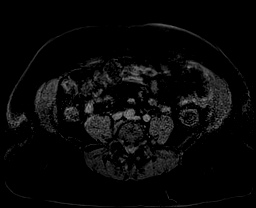
[im 35/104]
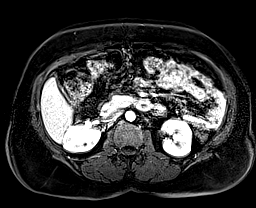
[im 69/104]
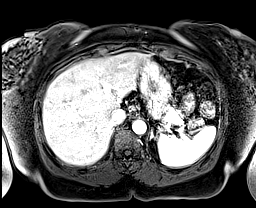
[im 104/104]
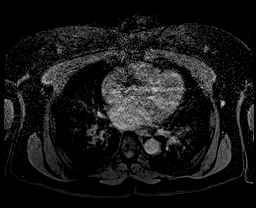

[Series 11: post 25 sec_sub · axial · 2.3mm · 1.41mm/px · z∈[-76,+161]mm · 4 of 104 slices shown]
[im 1/104]
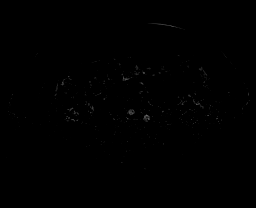
[im 35/104]
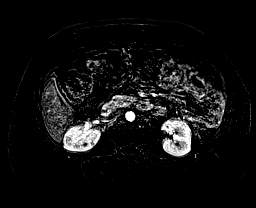
[im 69/104]
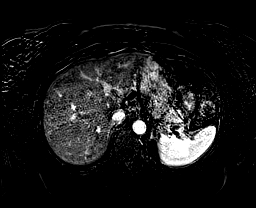
[im 104/104]
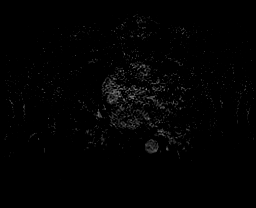

[Series 12: post 45 sec · axial · 2.3mm · 1.41mm/px · z∈[-76,+161]mm · 4 of 104 slices shown]
[im 1/104]
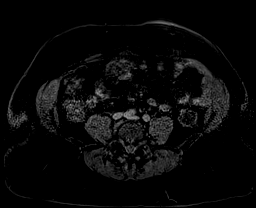
[im 35/104]
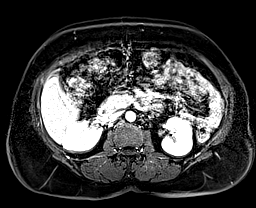
[im 69/104]
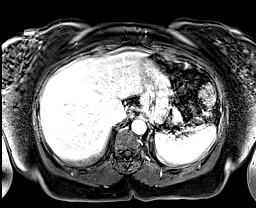
[im 104/104]
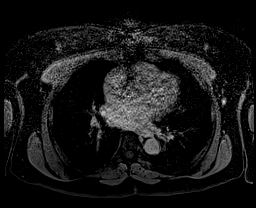

[Series 13: post 45 sec_sub · axial · 2.3mm · 1.41mm/px · z∈[-76,+161]mm · 4 of 104 slices shown]
[im 1/104]
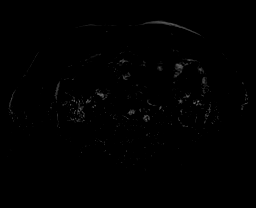
[im 35/104]
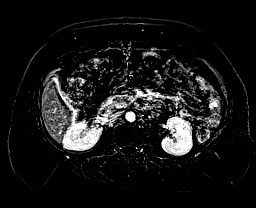
[im 69/104]
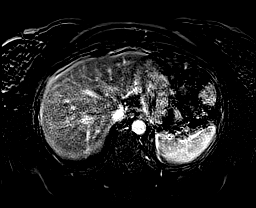
[im 104/104]
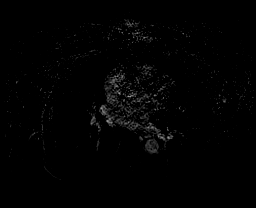

[Series 14: post 90 sec · axial · 2.3mm · 1.41mm/px · z∈[-76,+161]mm · 4 of 104 slices shown]
[im 1/104]
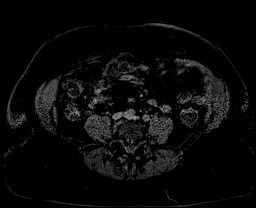
[im 35/104]
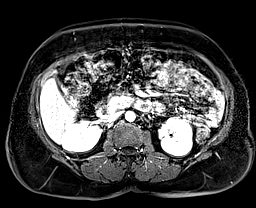
[im 69/104]
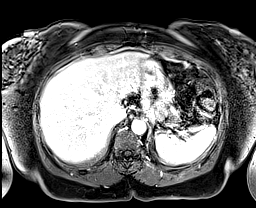
[im 104/104]
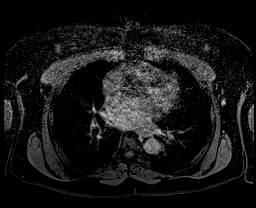

[Series 15: post 90 sec_sub · axial · 2.3mm · 1.41mm/px · z∈[-76,+161]mm · 4 of 104 slices shown]
[im 1/104]
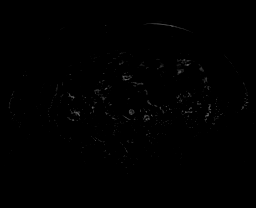
[im 35/104]
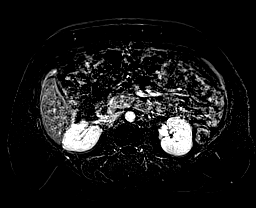
[im 69/104]
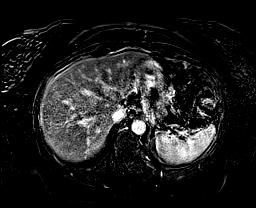
[im 104/104]
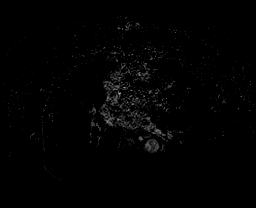

[Series 16: T1 dynamic post-contrast · coronal · 2.6mm · 0.78mm/px · 3 of 88 slices shown]
[im 1/88]
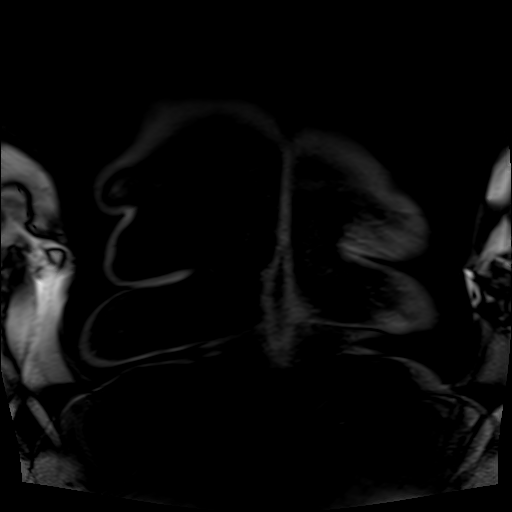
[im 44/88]
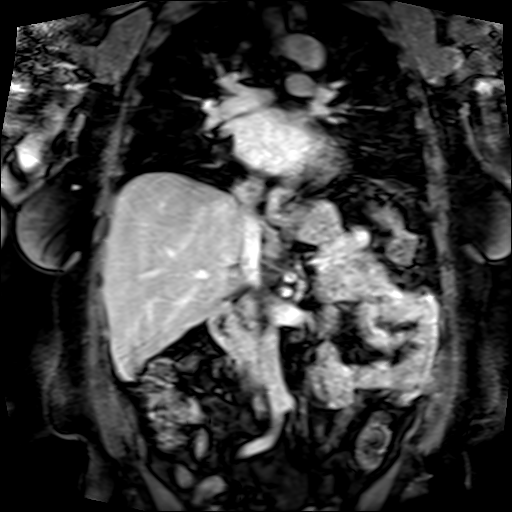
[im 88/88]
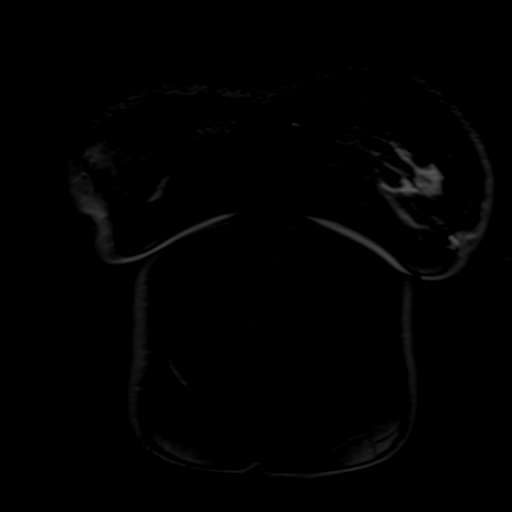

[Series 17: post axial 3+ · axial · 2.3mm · 1.41mm/px · z∈[-76,+161]mm · 4 of 104 slices shown (1 of 2)]
[im 1/104]
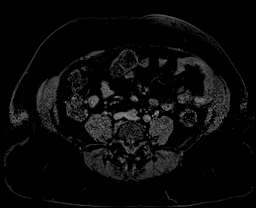
[im 35/104]
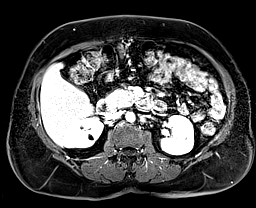
[im 69/104]
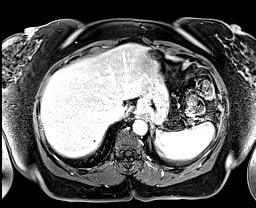
[im 104/104]
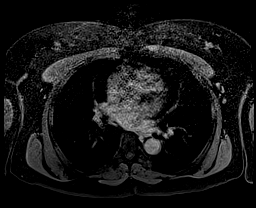

[Series 18: post axial 3+ · axial · 2.3mm · 1.41mm/px · z∈[-76,+161]mm · 4 of 104 slices shown (2 of 2)]
[im 1/104]
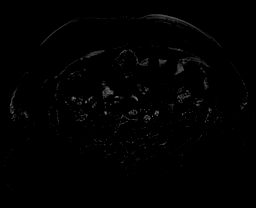
[im 35/104]
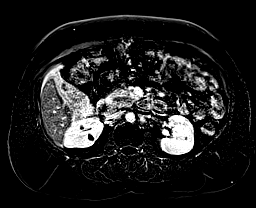
[im 69/104]
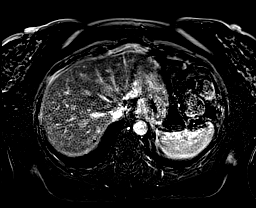
[im 104/104]
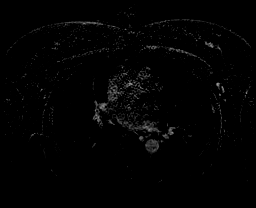

[48 of 48 positions shown; findings below may reference images not displayed]

FINDINGS: Exam detail is diminished due to motion artifact.

Lower chest: No acute findings.

Hepatobiliary: No focal liver abnormality. Status post
cholecystectomy. No intrahepatic biliary dilatation. Common bile
duct measures 8 mm.

Pancreas: No mass, inflammatory changes, or other parenchymal
abnormality identified.

Spleen:  Within normal limits in size and appearance.

Adrenals/Urinary Tract:  Normal appearance of the adrenal glands.

Postoperative changes from right lower pole partial
nephrectomy.Within the limitations of motion artifact there are no
specific features to suggest local tumor recurrence within the
inferior pole of the right kidney.

The previously characterized Bosniak category 3 lesion arising from
upper pole of left kidney is again noted. On today's study this
measures 1.7 x 1.3 cm, image [DATE]. Stable in size from previous
exam. As mentioned previously on the postcontrast images there is
evidence of mural thickening, mural nodularity and enhancing
septation, image 48/12.

Arising from the medial cortex of the left mid kidney is a T1
isointense and T2 hypointense lesion measuring 1.4 cm, image [DATE].
This is difficult to characterize due to excessive motion artifact.
Cannot rule out low level internal enhancement within this lesion,
image 65/17.

T2 hypointense and T1 hyperintense lesion arises from inferior pole
of the right kidney, image 83/9. Technically too small to reliably
characterize.

Additional small bilateral kidney cysts are noted without
complication.

Stomach/Bowel: Visualized portions within the abdomen are
unremarkable.

Vascular/Lymphatic: No pathologically enlarged lymph nodes
identified. No abdominal aortic aneurysm demonstrated.

Other:  No free fluid or fluid collections

Musculoskeletal: No suspicious bone lesions identified.
IMPRESSION: 1. Exam detail is diminished due to motion artifact.
2. Redemonstration of complex, enhancing cystic lesion arising from
upper pole of left kidney. As mentioned previously findings are
concerning for cystic renal cell carcinoma. This is stable when
compared with the previous imaging.
3. Mildly complex lesion arising from the medial cortex of the left
mid kidney is difficult to characterize due to excessive motion
artifact. This is compatible with a Bosniak category 2F lesion.
Advise follow-up imaging and 12 months with repeat renal protocol
MRI without with contrast material.
4. Stable appearance of the right kidney status post partial
nephrectomy.

## 2019-10-12 MED ORDER — GADOBENATE DIMEGLUMINE 529 MG/ML IV SOLN
15.0000 mL | Freq: Once | INTRAVENOUS | Status: AC | PRN
  Administered 2019-10-12: 15 mL via INTRAVENOUS

## 2019-10-20 ENCOUNTER — Other Ambulatory Visit (HOSPITAL_COMMUNITY): Payer: Self-pay | Admitting: Urology

## 2019-10-20 ENCOUNTER — Other Ambulatory Visit: Payer: Self-pay

## 2019-10-20 ENCOUNTER — Ambulatory Visit (HOSPITAL_COMMUNITY)
Admission: RE | Admit: 2019-10-20 | Discharge: 2019-10-20 | Disposition: A | Discharge status: Home / Self Care | Payer: Medicare Other | Source: Ambulatory Visit | Attending: Urology | Admitting: Urology

## 2019-10-20 DIAGNOSIS — C642 Malignant neoplasm of left kidney, except renal pelvis: Secondary | ICD-10-CM | POA: Diagnosis not present

## 2019-10-20 DIAGNOSIS — D49512 Neoplasm of unspecified behavior of left kidney: Secondary | ICD-10-CM | POA: Diagnosis not present

## 2019-10-20 DIAGNOSIS — Z85528 Personal history of other malignant neoplasm of kidney: Secondary | ICD-10-CM | POA: Insufficient documentation

## 2019-10-20 IMAGING — CR DG CHEST 2V
2 series · 2 of 2 positions shown · non-contrast
Comparison: [DATE]

CLINICAL DATA: Follow-up for left-sided renal neoplasm surgery.

EXAM:
CHEST - 2 VIEW

[w chest pa]
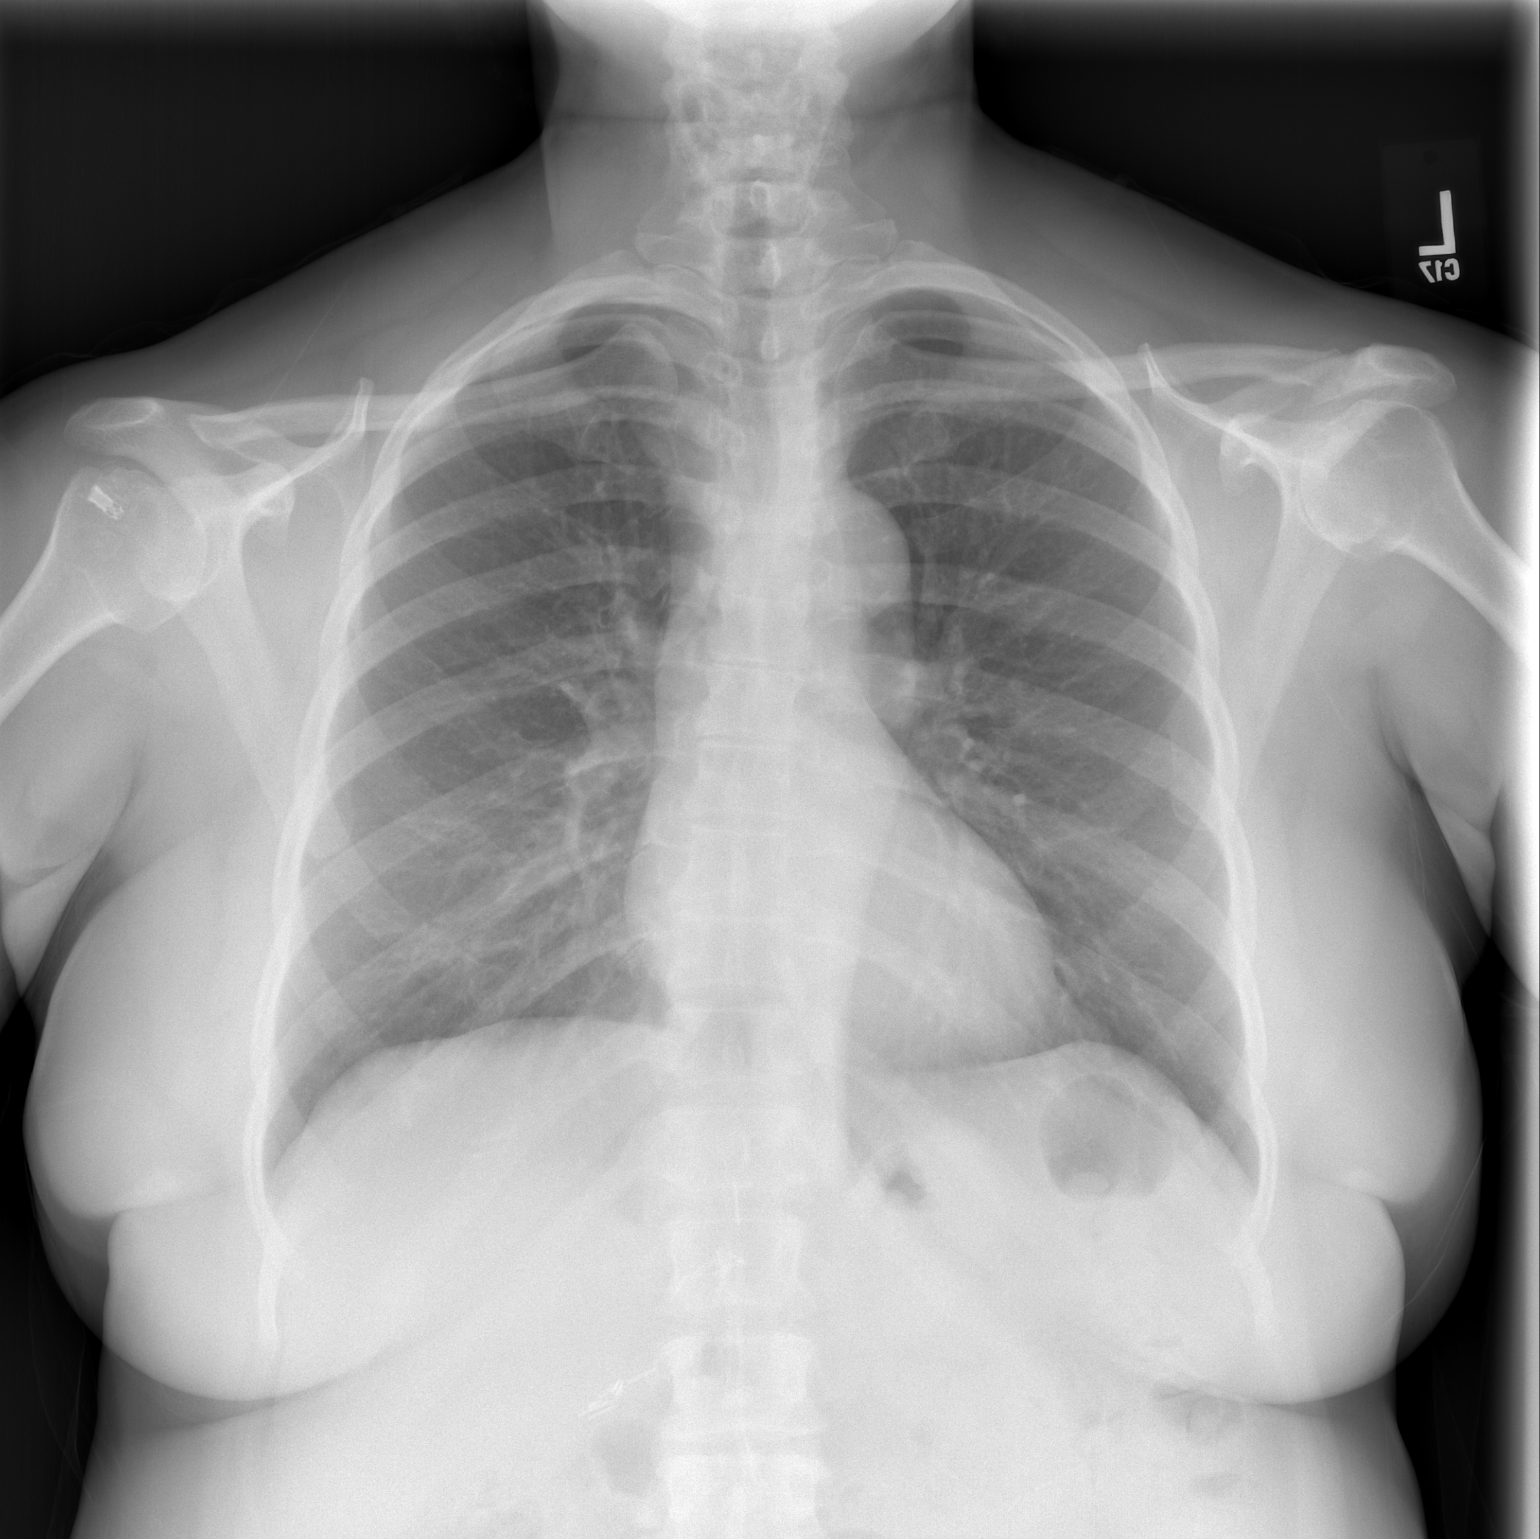

[w chest lat]
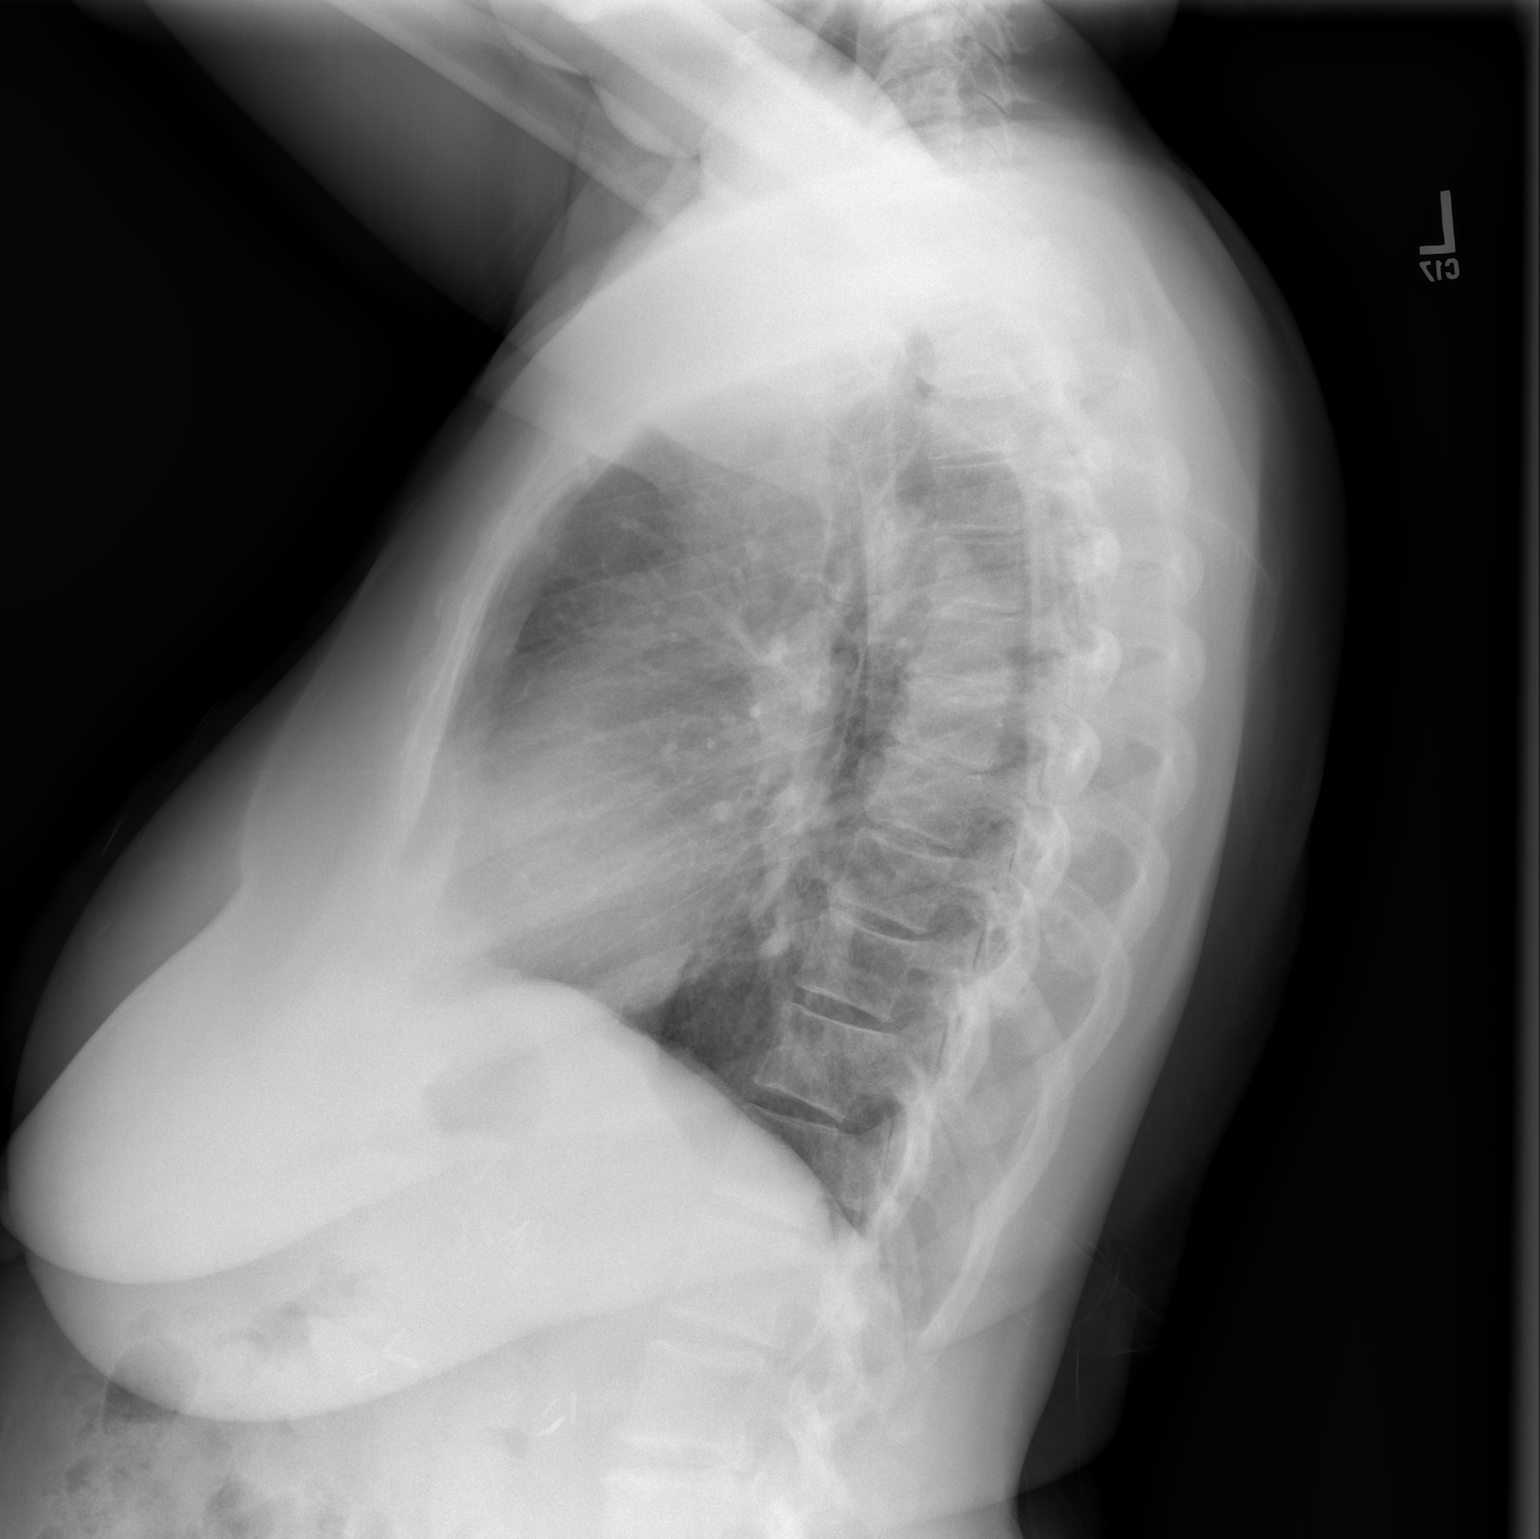

[2 of 2 positions shown; findings below may reference images not displayed]

FINDINGS: The heart size and mediastinal contours are within normal limits.
Both lungs are clear. The visualized skeletal structures are
unremarkable.
IMPRESSION: No active cardiopulmonary disease.

## 2019-10-27 ENCOUNTER — Other Ambulatory Visit: Payer: Self-pay | Admitting: Urology

## 2019-10-30 DIAGNOSIS — L700 Acne vulgaris: Secondary | ICD-10-CM | POA: Diagnosis not present

## 2019-11-16 NOTE — Patient Instructions (Addendum)
DUE TO COVID-19 ONLY ONE VISITOR IS ALLOWED TO COME WITH YOU AND STAY IN THE WAITING ROOM ONLY DURING PRE OP AND PROCEDURE DAY OF SURGERY. THE 1 VISITOR MAY VISIT WITH YOU AFTER SURGERY IN YOUR PRIVATE ROOM DURING VISITING HOURS ONLY!  YOU NEED TO HAVE A COVID 19 TEST ON 11-28-19 @ 9:50 AM, THIS TEST MUST BE DONE BEFORE SURGERY, COME  [place], [place] , [address].  ([place]) ONCE YOUR COVID TEST IS COMPLETED, PLEASE BEGIN THE QUARANTINE INSTRUCTIONS AS OUTLINED IN YOUR HANDOUT.                Bonnie Torres  11/16/2019   Your procedure is scheduled on: 11-30-19   Report to [facility] Main  Entrance    Report to Admitting at 9:30 AM    Call this number if you have problems the morning of surgery [phone]    Remember: Do not eat food or drink liquids :After Midnight.    Take these medicines the morning of surgery with A SIP OF WATER: Metoprolol Succinate  BRUSH YOUR TEETH MORNING OF SURGERY AND RINSE YOUR MOUTH OUT, NO CHEWING GUM CANDY OR MINTS.   DO NOT TAKE ANY DIABETIC MEDICATIONS DAY OF YOUR SURGERY                               You may not have any metal on your body including hair pins and              piercings     Do not wear jewelry, make-up, lotions, powders or perfumes, deodorant              Do not wear nail polish on your fingernails.  Do not shave  48 hours prior to surgery.                Do not bring valuables to the hospital. [place].  Contacts, dentures or bridgework may not be worn into surgery.  You may bring an overnight bag     Special Instructions: Please follow your prep,              Please read over the following fact sheets you were given: _____________________________________________________________________ How to Manage Your Diabetes Before and After Surgery  Why is it important to control my blood sugar before and after surgery? . Improving blood  sugar levels before and after surgery helps healing and can limit problems. . A way of improving blood sugar control is eating a healthy diet by: o  Eating less sugar and carbohydrates o  Increasing activity/exercise o  Talking with your doctor about reaching your blood sugar goals . High blood sugars (greater than 180 mg/dL) can raise your risk of infections and slow your recovery, so you will need to focus on controlling your diabetes during the weeks before surgery. . Make sure that the doctor who takes care of your diabetes knows about your planned surgery including the date and location.  How do I manage my blood sugar before surgery? . Check your blood sugar at least 4 times a day, starting 2 days before surgery, to make sure that the level is not too high or low. o Check your blood sugar the morning of your surgery when you wake up and every 2  hours until you get to the Short Stay unit. . If your blood sugar is less than 70 mg/dL, you will need to treat for low blood sugar: o Do not take insulin. o Treat a low blood sugar (less than 70 mg/dL) with  cup of clear juice (cranberry or apple), 4 glucose tablets, OR glucose gel. o Recheck blood sugar in 15 minutes after treatment (to make sure it is greater than 70 mg/dL). If your blood sugar is not greater than 70 mg/dL on recheck, call [phone] for further instructions. . Report your blood sugar to the short stay nurse when you get to Short Stay.  . If you are admitted to the hospital after surgery: o Your blood sugar will be checked by the staff and you will probably be given insulin after surgery (instead of oral diabetes medicines) to make sure you have good blood sugar levels. o The goal for blood sugar control after surgery is 80-180 mg/dL.   WHAT DO I DO ABOUT MY DIABETES MEDICATION?  [name] Do not take oral diabetes medicines (pills) the morning of surgery.  . THE DAY BEFORE SURGERY, take your usual prescribed  Metformin        Reviewed and Endorsed by [facility] Patient Education Committee, August 2015            [facility] - Preparing for Surgery Before surgery, you can play an important role.  Because skin is not sterile, your skin needs to be as free of germs as possible.  You can reduce the number of germs on your skin by washing with CHG (chlorahexidine gluconate) soap before surgery.  CHG is an antiseptic cleaner which kills germs and bonds with the skin to continue killing germs even after washing. Please DO NOT use if you have an allergy to CHG or antibacterial soaps.  If your skin becomes reddened/irritated stop using the CHG and inform your nurse when you arrive at Short Stay. Do not shave (including legs and underarms) for at least 48 hours prior to the first CHG shower.  You may shave your face/neck. Please follow these instructions carefully:  1.  Shower with CHG Soap the night before surgery and the  morning of Surgery.  2.  If you choose to wash your hair, wash your hair first as usual with your  normal  shampoo.  3.  After you shampoo, rinse your hair and body thoroughly to remove the  shampoo.                           4.  Use CHG as you would any other liquid soap.  You can apply chg directly  to the skin and wash                       Gently with a scrungie or clean washcloth.  5.  Apply the CHG Soap to your body ONLY FROM THE NECK DOWN.   Do not use on face/ open                           Wound or open sores. Avoid contact with eyes, ears mouth and genitals (private parts).                       Wash face,  Genitals (private parts) with your normal soap.  6.  Wash thoroughly, paying special attention to the area where your surgery  will be performed.  7.  Thoroughly rinse your body with warm water from the neck down.  8.  DO NOT shower/wash with your normal soap after using and rinsing off  the CHG Soap.                9.  Pat yourself dry with a clean towel.             10.  Wear clean pajamas.            11.  Place clean sheets on your bed the night of your first shower and do not  sleep with pets. Day of Surgery : Do not apply any lotions/deodorants the morning of surgery.  Please wear clean clothes to the hospital/surgery center.  FAILURE TO FOLLOW THESE INSTRUCTIONS MAY RESULT IN THE CANCELLATION OF YOUR SURGERY PATIENT SIGNATURE_________________________________  NURSE SIGNATURE__________________________________  ________________________________________________________________________

## 2019-11-16 NOTE — Progress Notes (Addendum)
PCP - Donella Stade, PAC Cardiologist -   Chest x-ray - 10-20-19 EKG -  Stress Test -  ECHO -  Cardiac Cath -   Sleep Study -  CPAP -   Fasting Blood Sugar -  Checks Blood Sugar _____ times a day  Blood Thinner Instructions: Aspirin Instructions: Last Dose:  Anesthesia review:   Patient denies shortness of breath, fever, cough and chest pain at PAT appointment   Patient verbalized understanding of instructions that were given to them at the PAT appointment. Patient was also instructed that they will need to review over the PAT instructions again at home before surgery.

## 2019-11-20 ENCOUNTER — Other Ambulatory Visit: Payer: Self-pay

## 2019-11-20 ENCOUNTER — Ambulatory Visit: Payer: Medicare Other | Admitting: Physician Assistant

## 2019-11-20 ENCOUNTER — Encounter: Payer: Self-pay | Admitting: Sports Medicine

## 2019-11-20 ENCOUNTER — Encounter (HOSPITAL_COMMUNITY)
Admission: RE | Admit: 2019-11-20 | Discharge: 2019-11-20 | Disposition: A | Discharge status: Home / Self Care | Payer: Medicare Other | Source: Ambulatory Visit | Attending: Urology | Admitting: Urology

## 2019-11-20 ENCOUNTER — Ambulatory Visit (INDEPENDENT_AMBULATORY_CARE_PROVIDER_SITE_OTHER): Payer: Medicare Other | Admitting: Sports Medicine

## 2019-11-20 ENCOUNTER — Encounter (HOSPITAL_COMMUNITY): Payer: Self-pay

## 2019-11-20 DIAGNOSIS — G5622 Lesion of ulnar nerve, left upper limb: Secondary | ICD-10-CM | POA: Insufficient documentation

## 2019-11-20 DIAGNOSIS — Z01818 Encounter for other preprocedural examination: Secondary | ICD-10-CM | POA: Insufficient documentation

## 2019-11-20 HISTORY — DX: Other complications of anesthesia, initial encounter: T88.59XA

## 2019-11-20 HISTORY — DX: Other specified postprocedural states: R11.2

## 2019-11-20 HISTORY — DX: Nausea with vomiting, unspecified: Z98.890

## 2019-11-20 HISTORY — DX: Nausea with vomiting, unspecified: R11.2

## 2019-11-20 NOTE — Assessment & Plan Note (Signed)
For approximately 6 months this pleasant 64 year old female has had mild numbness and tingling in her left fifth finger, worse at night. On exam she has a positive cubital tunnel Tinel's sign, she does have minimal weakness to finger abduction. Overall exam is unremarkable, she has a negative Tinel's sign at the canal of [place]ion. I would like her to do a elbow sleeve at night, ulnar nerve mobilization exercises, return to see me in 6 weeks, ulnar nerve hydrodissection if no better.

## 2019-11-20 NOTE — Progress Notes (Signed)
    Procedures performed today:    None.  Independent interpretation of notes and tests performed by another provider:   None.  Brief History, Exam, Impression, and Recommendations:    Cubital tunnel syndrome, left For approximately 6 months this pleasant 64 year old female has had mild numbness and tingling in her left fifth finger, worse at night. On exam she has a positive cubital tunnel Tinel's sign, she does have minimal weakness to finger abduction. Overall exam is unremarkable, she has a negative Tinel's sign at the canal of Gion. I would like her to do a elbow sleeve at night, ulnar nerve mobilization exercises, return to see me in 6 weeks, ulnar nerve hydrodissection if no better.    ___________________________________________ Gwen Her. Dianah Field, M.D., ABFM., CAQSM. Primary Care and [place] Instructor of [place] [facility] of Medicine

## 2019-11-21 ENCOUNTER — Encounter (HOSPITAL_COMMUNITY)
Admission: RE | Admit: 2019-11-21 | Discharge: 2019-11-21 | Disposition: A | Discharge status: Home / Self Care | Payer: Medicare Other | Source: Ambulatory Visit | Attending: Urology | Admitting: Urology

## 2019-11-21 DIAGNOSIS — Z01818 Encounter for other preprocedural examination: Secondary | ICD-10-CM | POA: Diagnosis not present

## 2019-11-21 LAB — CBC
HCT: 38.8 % (ref 36.0–46.0)
Hemoglobin: 13.5 g/dL (ref 12.0–15.0)
MCH: 26.6 pg (ref 26.0–34.0)
MCHC: 34.8 g/dL (ref 30.0–36.0)
MCV: 76.4 fL — ABNORMAL LOW (ref 80.0–100.0)
Platelets: 297 10*3/uL (ref 150–400)
RBC: 5.08 MIL/uL (ref 3.87–5.11)
RDW: 13.8 % (ref 11.5–15.5)
WBC: 5.4 10*3/uL (ref 4.0–10.5)
nRBC: 0 % (ref 0.0–0.2)

## 2019-11-21 LAB — BASIC METABOLIC PANEL
Anion gap: 10 (ref 5–15)
BUN: 12 mg/dL (ref 8–23)
CO2: 26 mmol/L (ref 22–32)
Calcium: 9.3 mg/dL (ref 8.9–10.3)
Chloride: 102 mmol/L (ref 98–111)
Creatinine, Ser: 0.89 mg/dL (ref 0.44–1.00)
GFR calc Af Amer: >60 mL/min (ref >60)
GFR calc non Af Amer: >60 mL/min (ref >60)
Glucose, Bld: 120 mg/dL — ABNORMAL HIGH (ref 70–99)
Potassium: 3.7 mmol/L (ref 3.5–5.1)
Sodium: 138 mmol/L (ref 135–145)

## 2019-11-21 LAB — GLUCOSE, CAPILLARY: Glucose-Capillary: 110 mg/dL — ABNORMAL HIGH (ref 70–99)

## 2019-11-21 LAB — HEMOGLOBIN A1C
Hgb A1c MFr Bld: 5.6 % (ref 4.8–5.6)
Mean Plasma Glucose: 114.02 mg/dL

## 2019-11-28 ENCOUNTER — Other Ambulatory Visit (HOSPITAL_COMMUNITY)
Admission: RE | Admit: 2019-11-28 | Discharge: 2019-11-28 | Disposition: A | Discharge status: Home / Self Care | Payer: Medicare Other | Source: Ambulatory Visit | Attending: Urology | Admitting: Urology

## 2019-11-28 DIAGNOSIS — Z20822 Contact with and (suspected) exposure to covid-19: Secondary | ICD-10-CM | POA: Insufficient documentation

## 2019-11-28 DIAGNOSIS — M109 Gout, unspecified: Secondary | ICD-10-CM | POA: Diagnosis not present

## 2019-11-28 DIAGNOSIS — E119 Type 2 diabetes mellitus without complications: Secondary | ICD-10-CM | POA: Diagnosis not present

## 2019-11-28 DIAGNOSIS — I10 Essential (primary) hypertension: Secondary | ICD-10-CM | POA: Diagnosis not present

## 2019-11-28 DIAGNOSIS — R000 Tachycardia, unspecified: Secondary | ICD-10-CM | POA: Diagnosis not present

## 2019-11-28 DIAGNOSIS — E7800 Pure hypercholesterolemia, unspecified: Secondary | ICD-10-CM | POA: Diagnosis not present

## 2019-11-28 DIAGNOSIS — C642 Malignant neoplasm of left kidney, except renal pelvis: Secondary | ICD-10-CM | POA: Diagnosis not present

## 2019-11-28 DIAGNOSIS — Z905 Acquired absence of kidney: Secondary | ICD-10-CM | POA: Diagnosis not present

## 2019-11-28 DIAGNOSIS — K567 Ileus, unspecified: Secondary | ICD-10-CM | POA: Diagnosis not present

## 2019-11-28 DIAGNOSIS — Z01812 Encounter for preprocedural laboratory examination: Secondary | ICD-10-CM | POA: Insufficient documentation

## 2019-11-28 DIAGNOSIS — Z5331 Laparoscopic surgical procedure converted to open procedure: Secondary | ICD-10-CM | POA: Diagnosis not present

## 2019-11-28 LAB — SARS CORONAVIRUS 2 (TAT 6-24 HRS): SARS Coronavirus 2: NEGATIVE

## 2019-11-29 NOTE — H&P (Signed)
1. Renal cell carcinoma  2. Complex left renal cyst   Bonnie Torres returns today for further surveillance of her renal cell carcinoma status post right partial nephrectomy 3 and half years ago. She also is here for follow-up of her complex left renal cystic mass. She denies any hematuria, unintentional weight loss, night sweats, or other abdominal complaints. She denies any changes in her overall medical condition. She follows up today with a follow-up MRI of the abdomen.     ALLERGIES: None   MEDICATIONS: Allopurinol  Metformin Hcl  Metoprolol Succinate  Meloxicam  Olmesartan-Amlodipine-Hctz  Potassium Chloride  Pravastatin Sodium  Prozac  Trazodone Hcl     GU PSH: Locm 300-399Mg/Ml Iodine,1Ml - 04/05/2019, 03/24/2018, 2019 Partial nephrectomy (open), Right - 2017     NON-GU PSH: Right foot, great toe, Right Shoulder Arthroscopy/surgery, Right     GU PMH: History of kidney cancer - 03/29/2018 Left renal neoplasm - 2019 Renal cell carcinoma, right - 2018 Right renal neoplasm - 2017      PMH Notes:   1) Renal cell carcinoma: She is s/p right open partial nephrectomy on 06/18/16 at the time of resection of a large intra-abdominal mass for a 4 cm right renal mass.   Diagnosis: pT1a Nx Mx, Fuhrman grade III clear cell renal cell carcinoma with negative margins  Baseline renal function: Cr 1.0, eGFR > 60 ml/min   NON-GU PMH: Diabetes Type 2 Gout Hypercholesterolemia Hypertension    FAMILY HISTORY: None   SOCIAL HISTORY: Marital Status: Widowed Preferred Language: English; Ethnicity: Not Hispanic Or Latino; Race: Black or African American Current Smoking Status: Patient has never smoked.  Does not drink anymore.  Drinks 2 caffeinated drinks per day.    REVIEW OF SYSTEMS:    GU Review Female:   Patient denies frequent urination, hard to postpone urination, burning /pain with urination, get up at night to urinate, leakage of urine, stream starts and stops, trouble  starting your stream, have to strain to urinate, and currently pregnant.  Gastrointestinal (Lower):   Patient denies diarrhea and constipation.  Gastrointestinal (Upper):   Patient denies nausea and vomiting.  Constitutional:   Patient denies fever, night sweats, weight loss, and fatigue.  Skin:   Patient denies itching and skin rash/ lesion.  Eyes:   Patient denies blurred vision and double vision.  Ears/ Nose/ Throat:   Patient denies sore throat and sinus problems.  Hematologic/Lymphatic:   Patient denies swollen glands and easy bruising.  Cardiovascular:   Patient denies leg swelling and chest pains.  Respiratory:   Patient denies cough and shortness of breath.  Endocrine:   Patient denies excessive thirst.  Musculoskeletal:   Patient denies back pain and joint pain.  Neurological:   Patient denies headaches and dizziness.  Psychologic:   Patient denies depression and anxiety.   VITAL SIGNS:     Weight 165 lb / 74.84 kg  Height 59 in / 149.86 cm  BMI 33.3 kg/m   MULTI-SYSTEM PHYSICAL EXAMINATION:    Constitutional: Well-nourished. No physical deformities. Normally developed. Good grooming.  Neck: Neck symmetrical, not swollen. Normal tracheal position.  Respiratory: No labored breathing, no use of accessory muscles. Clear bilaterally.  Cardiovascular: Normal temperature, normal extremity pulses, no swelling, no varicosities. Regular rate and rhythm.  Lymphatic: No enlargement of neck, axillae, groin.  Skin: No paleness, no jaundice, no cyanosis. No lesion, no ulcer, no rash.  Neurologic / Psychiatric: Oriented to time, oriented to place, oriented to person. No depression, no anxiety,  no agitation.  Gastrointestinal: No mass, no tenderness, no rigidity, non obese abdomen. Large well-healed midline incision.  Eyes: Normal conjunctivae. Normal eyelids.  Ears, Nose, Mouth, and Throat: Left ear no scars, no lesions, no masses. Right ear no scars, no lesions, no masses. Nose no scars,  no lesions, no masses. Normal hearing. Normal lips.  Musculoskeletal: Normal gait and station of head and neck.     Complexity of Data:  Lab Test Review:   CMP  Records Review:   Previous Patient Records  Urine Test Review:   Urinalysis  X-Ray Review: MRI Abdomen: Reviewed Films.    Notes:                     CLINICAL DATA: Follow-up renal cell carcinoma.   EXAM:  MRI ABDOMEN WITHOUT AND WITH CONTRAST   TECHNIQUE:  Multiplanar multisequence MR imaging of the abdomen was performed  both before and after the administration of intravenous contrast.   CONTRAST: 59m MULTIHANCE GADOBENATE DIMEGLUMINE 529 MG/ML IV SOLN   COMPARISON: 04/05/2019   FINDINGS:  Exam detail is diminished due to motion artifact.   Lower chest: No acute findings.   Hepatobiliary: No focal liver abnormality. Status post  cholecystectomy. No intrahepatic biliary dilatation. Common bile  duct measures 8 mm.   Pancreas: No mass, inflammatory changes, or other parenchymal  abnormality identified.   Spleen: Within normal limits in size and appearance.   Adrenals/Urinary Tract: Normal appearance of the adrenal glands.   Postoperative changes from right lower pole partial  nephrectomy.Within the limitations of motion artifact there are no  specific features to suggest local tumor recurrence within the  inferior pole of the right kidney.   The previously characterized Bosniak category 3 lesion arising from  upper pole of left kidney is again noted. On today's study this  measures 1.7 x 1.3 cm, image 19/5. Stable in size from previous  exam. As mentioned previously on the postcontrast images there is  evidence of mural thickening, mural nodularity and enhancing  septation, image 48/12.   Arising from the medial cortex of the left mid kidney is a T1  isointense and T2 hypointense lesion measuring 1.4 cm, image 24/6.  This is difficult to characterize due to excessive motion artifact.  Cannot rule out low  level internal enhancement within this lesion,  image 65/17.   T2 hypointense and T1 hyperintense lesion arises from inferior pole  of the right kidney, image 83/9. Technically too small to reliably  characterize.   Additional small bilateral kidney cysts are noted without  complication.   Stomach/Bowel: Visualized portions within the abdomen are  unremarkable.   Vascular/Lymphatic: No pathologically enlarged lymph nodes  identified. No abdominal aortic aneurysm demonstrated.   Other: No free fluid or fluid collections   Musculoskeletal: No suspicious bone lesions identified.   IMPRESSION:  1. Exam detail is diminished due to motion artifact.  2. Redemonstration of complex, enhancing cystic lesion arising from  upper pole of left kidney. As mentioned previously findings are  concerning for cystic renal cell carcinoma. This is stable when  compared with the previous imaging.  3. Mildly complex lesion arising from the medial cortex of the left  mid kidney is difficult to characterize due to excessive motion  artifact. This is compatible with a Bosniak category 53F lesion.  Advise follow-up imaging and 12 months with repeat renal protocol  MRI without with contrast material.  4. Stable appearance of the right kidney status post partial  nephrectomy.    Electronically Signed  By: Kerby Moors M.D.  On: 10/13/2019 09:49   Creatinine 0.6, LFTs normal.     ASSESSMENT:      ICD-10 Details  1 GU:   History of kidney cancer - Z85.528   2   Left renal neoplasm - S00.447    PLAN:        Left renal neoplasm: We discussed her MRI today which again demonstrates a stable 1.8 cm upper pole complex left renal cyst. This does have characteristics that do raise concern for a cystic renal cell carcinoma. We discussed options for ongoing management being further surveillance imaging vs. definitive therapy. It is not in an ideal location for ablative therapy. Furthermore, biopsy would  likely be low yield considering the cystic nature of this mass. After discussion, she does wish to proceed with surgical resection.   We have discussed the risks of treatment in detail including but not limited to bleeding, infection, heart attack, stroke, death, venothromoboembolism, cancer recurrence, injury/damage to surrounding organs and structures, urine leak, the possibility of open surgical conversion for patients undergoing minimally invasive surgery, the risk of developing chronic kidney disease and its associated implications, and the potential risk of end stage renal disease possibly necessitating dialysis.   Considering her prior surgical history, she understands that she will be at high risk for open surgical conversion. However, we will plan to attempt a left robot assisted laparoscopic partial nephrectomy if possible.

## 2019-11-30 ENCOUNTER — Inpatient Hospital Stay (HOSPITAL_COMMUNITY)
Admission: RE | Admit: 2019-11-30 | Discharge: 2019-12-05 | DRG: 657 | Disposition: A | Discharge status: Home / Self Care | Payer: Medicare Other | Attending: Urology | Admitting: Urology

## 2019-11-30 ENCOUNTER — Encounter (HOSPITAL_COMMUNITY): Payer: Self-pay | Admitting: Urology

## 2019-11-30 ENCOUNTER — Encounter (HOSPITAL_COMMUNITY)
Admission: RE | Disposition: A | Discharge status: Home / Self Care | Payer: Self-pay | Source: Ambulatory Visit | Attending: Urology

## 2019-11-30 ENCOUNTER — Ambulatory Visit (HOSPITAL_COMMUNITY): Payer: Medicare Other | Admitting: Registered Nurse

## 2019-11-30 ENCOUNTER — Other Ambulatory Visit: Payer: Self-pay

## 2019-11-30 ENCOUNTER — Telehealth (HOSPITAL_COMMUNITY): Payer: Self-pay | Admitting: *Deleted

## 2019-11-30 DIAGNOSIS — D49512 Neoplasm of unspecified behavior of left kidney: Secondary | ICD-10-CM | POA: Diagnosis present

## 2019-11-30 DIAGNOSIS — R000 Tachycardia, unspecified: Secondary | ICD-10-CM | POA: Diagnosis not present

## 2019-11-30 DIAGNOSIS — K567 Ileus, unspecified: Secondary | ICD-10-CM | POA: Diagnosis not present

## 2019-11-30 DIAGNOSIS — C642 Malignant neoplasm of left kidney, except renal pelvis: Secondary | ICD-10-CM | POA: Diagnosis not present

## 2019-11-30 DIAGNOSIS — J9811 Atelectasis: Secondary | ICD-10-CM | POA: Diagnosis not present

## 2019-11-30 DIAGNOSIS — Z905 Acquired absence of kidney: Secondary | ICD-10-CM | POA: Diagnosis not present

## 2019-11-30 DIAGNOSIS — N281 Cyst of kidney, acquired: Secondary | ICD-10-CM | POA: Diagnosis not present

## 2019-11-30 DIAGNOSIS — E119 Type 2 diabetes mellitus without complications: Secondary | ICD-10-CM | POA: Diagnosis not present

## 2019-11-30 DIAGNOSIS — M109 Gout, unspecified: Secondary | ICD-10-CM | POA: Diagnosis present

## 2019-11-30 DIAGNOSIS — Z20822 Contact with and (suspected) exposure to covid-19: Secondary | ICD-10-CM | POA: Diagnosis not present

## 2019-11-30 DIAGNOSIS — E7800 Pure hypercholesterolemia, unspecified: Secondary | ICD-10-CM | POA: Diagnosis present

## 2019-11-30 DIAGNOSIS — I10 Essential (primary) hypertension: Secondary | ICD-10-CM | POA: Diagnosis present

## 2019-11-30 DIAGNOSIS — Z5331 Laparoscopic surgical procedure converted to open procedure: Secondary | ICD-10-CM | POA: Diagnosis not present

## 2019-11-30 HISTORY — PX: ROBOTIC ASSITED PARTIAL NEPHRECTOMY: SHX6087

## 2019-11-30 LAB — TYPE AND SCREEN
ABO/RH(D): B POS
Antibody Screen: NEGATIVE

## 2019-11-30 LAB — GLUCOSE, CAPILLARY
Glucose-Capillary: 104 mg/dL — ABNORMAL HIGH (ref 70–99)
Glucose-Capillary: 122 mg/dL — ABNORMAL HIGH (ref 70–99)
Glucose-Capillary: 138 mg/dL — ABNORMAL HIGH (ref 70–99)
Glucose-Capillary: 169 mg/dL — ABNORMAL HIGH (ref 70–99)
Glucose-Capillary: 220 mg/dL — ABNORMAL HIGH (ref 70–99)

## 2019-11-30 LAB — BASIC METABOLIC PANEL
Anion gap: 9 (ref 5–15)
BUN: 7 mg/dL — ABNORMAL LOW (ref 8–23)
CO2: 22 mmol/L (ref 22–32)
Calcium: 7.8 mg/dL — ABNORMAL LOW (ref 8.9–10.3)
Chloride: 107 mmol/L (ref 98–111)
Creatinine, Ser: 0.68 mg/dL (ref 0.44–1.00)
GFR calc Af Amer: >60 mL/min (ref >60)
GFR calc non Af Amer: >60 mL/min (ref >60)
Glucose, Bld: 124 mg/dL — ABNORMAL HIGH (ref 70–99)
Potassium: 3.7 mmol/L (ref 3.5–5.1)
Sodium: 138 mmol/L (ref 135–145)

## 2019-11-30 LAB — HEMOGLOBIN AND HEMATOCRIT, BLOOD
HCT: 31.8 % — ABNORMAL LOW (ref 36.0–46.0)
Hemoglobin: 10.8 g/dL — ABNORMAL LOW (ref 12.0–15.0)

## 2019-11-30 SURGERY — NEPHRECTOMY, PARTIAL, ROBOT-ASSISTED
Anesthesia: General | Laterality: Left

## 2019-11-30 MED ORDER — BUPIVACAINE LIPOSOME 1.3 % IJ SUSP
20.0000 mL | Freq: Once | INTRAMUSCULAR | Status: AC
  Administered 2019-11-30: 20 mL
  Filled 2019-11-30: qty 20

## 2019-11-30 MED ORDER — LIDOCAINE 2% (20 MG/ML) 5 ML SYRINGE
INTRAMUSCULAR | Status: DC | PRN
  Administered 2019-11-30: 100 mg via INTRAVENOUS

## 2019-11-30 MED ORDER — DEXAMETHASONE SODIUM PHOSPHATE 10 MG/ML IJ SOLN
INTRAMUSCULAR | Status: DC | PRN
  Administered 2019-11-30: 10 mg via INTRAVENOUS

## 2019-11-30 MED ORDER — HYDROMORPHONE HCL 1 MG/ML IJ SOLN
0.2500 mg | INTRAMUSCULAR | Status: DC | PRN
  Administered 2019-11-30 (×4): 0.5 mg via INTRAVENOUS

## 2019-11-30 MED ORDER — PHENYLEPHRINE HCL-NACL 10-0.9 MG/250ML-% IV SOLN
INTRAVENOUS | Status: DC | PRN
Start: 2019-11-30 — End: 2019-11-30
  Administered 2019-11-30: 35 ug/min via INTRAVENOUS

## 2019-11-30 MED ORDER — LACTATED RINGERS IR SOLN
Status: DC | PRN
  Administered 2019-11-30: 1000 mL

## 2019-11-30 MED ORDER — SCOPOLAMINE 1 MG/3DAYS TD PT72
MEDICATED_PATCH | TRANSDERMAL | Status: AC
  Filled 2019-11-30: qty 1

## 2019-11-30 MED ORDER — CHLORHEXIDINE GLUCONATE CLOTH 2 % EX PADS: 6.0000 | MEDICATED_PAD | Freq: Every day | CUTANEOUS | Status: DC

## 2019-11-30 MED ORDER — SODIUM CHLORIDE (PF) 0.9 % IJ SOLN
INTRAMUSCULAR | Status: DC | PRN
  Administered 2019-11-30: 20 mL

## 2019-11-30 MED ORDER — ONDANSETRON HCL 4 MG/2ML IJ SOLN: INTRAMUSCULAR | Status: DC | PRN

## 2019-11-30 MED ORDER — DOCUSATE SODIUM 100 MG PO CAPS
100.0000 mg | ORAL_CAPSULE | Freq: Two times a day (BID) | ORAL | Status: DC
  Administered 2019-11-30 – 2019-12-05 (×6): 100 mg via ORAL
  Filled 2019-11-30 (×7): qty 1

## 2019-11-30 MED ORDER — LIDOCAINE 2% (20 MG/ML) 5 ML SYRINGE
INTRAMUSCULAR | Status: AC
  Filled 2019-11-30: qty 5

## 2019-11-30 MED ORDER — MAGNESIUM CITRATE PO SOLN: 0.5000 | Freq: Once | ORAL | Status: DC

## 2019-11-30 MED ORDER — DEXTROSE-NACL 5-0.45 % IV SOLN: INTRAVENOUS | Status: DC

## 2019-11-30 MED ORDER — SODIUM CHLORIDE 0.9 % IV SOLN: INTRAVENOUS | Status: DC

## 2019-11-30 MED ORDER — BUPIVACAINE-EPINEPHRINE 0.5% -1:200000 IJ SOLN
INTRAMUSCULAR | Status: AC
  Filled 2019-11-30: qty 1

## 2019-11-30 MED ORDER — HYDROMORPHONE HCL 1 MG/ML IJ SOLN
INTRAMUSCULAR | Status: AC
  Filled 2019-11-30: qty 1

## 2019-11-30 MED ORDER — LIDOCAINE 20MG/ML (2%) 15 ML SYRINGE OPTIME
INTRAMUSCULAR | Status: DC | PRN
  Administered 2019-11-30: 1.5 mg/kg/h via INTRAVENOUS

## 2019-11-30 MED ORDER — ROCURONIUM BROMIDE 10 MG/ML (PF) SYRINGE
PREFILLED_SYRINGE | INTRAVENOUS | Status: AC
  Filled 2019-11-30: qty 10

## 2019-11-30 MED ORDER — ONDANSETRON HCL 4 MG/2ML IJ SOLN
INTRAMUSCULAR | Status: DC | PRN
  Administered 2019-11-30: 4 mg via INTRAVENOUS

## 2019-11-30 MED ORDER — SUGAMMADEX SODIUM 200 MG/2ML IV SOLN
INTRAVENOUS | Status: DC | PRN
  Administered 2019-11-30: 200 mg via INTRAVENOUS

## 2019-11-30 MED ORDER — FENTANYL CITRATE (PF) 250 MCG/5ML IJ SOLN
INTRAMUSCULAR | Status: AC
  Filled 2019-11-30: qty 5

## 2019-11-30 MED ORDER — MIDAZOLAM HCL 5 MG/5ML IJ SOLN
INTRAMUSCULAR | Status: DC | PRN
  Administered 2019-11-30: 2 mg via INTRAVENOUS

## 2019-11-30 MED ORDER — MEPERIDINE HCL 50 MG/ML IJ SOLN: 6.2500 mg | INTRAMUSCULAR | Status: DC | PRN

## 2019-11-30 MED ORDER — PHENYLEPHRINE HCL (PRESSORS) 10 MG/ML IV SOLN
INTRAVENOUS | Status: AC
  Filled 2019-11-30: qty 1

## 2019-11-30 MED ORDER — 0.9 % SODIUM CHLORIDE (POUR BTL) OPTIME
TOPICAL | Status: DC | PRN
  Administered 2019-11-30: 2000 mL

## 2019-11-30 MED ORDER — HYDROMORPHONE HCL 1 MG/ML IJ SOLN: 0.5000 mg | INTRAMUSCULAR | Status: DC | PRN

## 2019-11-30 MED ORDER — STERILE WATER FOR IRRIGATION IR SOLN
Status: DC | PRN
  Administered 2019-11-30: 1000 mL

## 2019-11-30 MED ORDER — SCOPOLAMINE 1 MG/3DAYS TD PT72
MEDICATED_PATCH | TRANSDERMAL | Status: DC | PRN
  Administered 2019-11-30: 1 via TRANSDERMAL

## 2019-11-30 MED ORDER — MIDAZOLAM HCL 2 MG/2ML IJ SOLN
INTRAMUSCULAR | Status: AC
  Filled 2019-11-30: qty 2

## 2019-11-30 MED ORDER — ROCURONIUM BROMIDE 10 MG/ML (PF) SYRINGE
PREFILLED_SYRINGE | INTRAVENOUS | Status: DC | PRN
  Administered 2019-11-30: 80 mg via INTRAVENOUS

## 2019-11-30 MED ORDER — LACTATED RINGERS IV SOLN: INTRAVENOUS | Status: DC

## 2019-11-30 MED ORDER — ONDANSETRON HCL 4 MG/2ML IJ SOLN
4.0000 mg | INTRAMUSCULAR | Status: DC | PRN
  Administered 2019-12-02 – 2019-12-03 (×4): 4 mg via INTRAVENOUS
  Filled 2019-11-30 (×4): qty 2

## 2019-11-30 MED ORDER — ONDANSETRON HCL 4 MG/2ML IJ SOLN: 4.0000 mg | Freq: Once | INTRAMUSCULAR | Status: DC | PRN

## 2019-11-30 MED ORDER — PROPOFOL 10 MG/ML IV BOLUS
INTRAVENOUS | Status: DC | PRN
  Administered 2019-11-30: 110 mg via INTRAVENOUS

## 2019-11-30 MED ORDER — CEFAZOLIN SODIUM-DEXTROSE 2-4 GM/100ML-% IV SOLN
2.0000 g | Freq: Once | INTRAVENOUS | Status: AC
  Administered 2019-11-30: 2 g via INTRAVENOUS
  Filled 2019-11-30: qty 100

## 2019-11-30 MED ORDER — ORAL CARE MOUTH RINSE: 15.0000 mL | Freq: Once | OROMUCOSAL | Status: AC

## 2019-11-30 MED ORDER — DEXAMETHASONE SODIUM PHOSPHATE 10 MG/ML IJ SOLN
INTRAMUSCULAR | Status: AC
  Filled 2019-11-30: qty 1

## 2019-11-30 MED ORDER — CEFAZOLIN SODIUM-DEXTROSE 1-4 GM/50ML-% IV SOLN
1.0000 g | Freq: Three times a day (TID) | INTRAVENOUS | Status: AC
  Administered 2019-11-30 – 2019-12-01 (×2): 1 g via INTRAVENOUS
  Filled 2019-11-30 (×2): qty 50

## 2019-11-30 MED ORDER — OXYCODONE HCL 5 MG PO TABS: 5.0000 mg | ORAL_TABLET | ORAL | Status: DC | PRN

## 2019-11-30 MED ORDER — CHLORHEXIDINE GLUCONATE 0.12 % MT SOLN
15.0000 mL | Freq: Once | OROMUCOSAL | Status: AC
  Administered 2019-11-30: 15 mL via OROMUCOSAL

## 2019-11-30 MED ORDER — ACETAMINOPHEN 10 MG/ML IV SOLN
1000.0000 mg | Freq: Four times a day (QID) | INTRAVENOUS | Status: AC
  Administered 2019-11-30 – 2019-12-01 (×4): 1000 mg via INTRAVENOUS
  Filled 2019-11-30 (×4): qty 100

## 2019-11-30 MED ORDER — FENTANYL CITRATE (PF) 250 MCG/5ML IJ SOLN
INTRAMUSCULAR | Status: DC | PRN
  Administered 2019-11-30: 50 ug via INTRAVENOUS
  Administered 2019-11-30: 150 ug via INTRAVENOUS

## 2019-11-30 MED ORDER — DIPHENHYDRAMINE HCL 50 MG/ML IJ SOLN
12.5000 mg | Freq: Four times a day (QID) | INTRAMUSCULAR | Status: DC | PRN
  Administered 2019-12-03: 12.5 mg via INTRAVENOUS
  Filled 2019-11-30: qty 1

## 2019-11-30 MED ORDER — ONDANSETRON HCL 4 MG/2ML IJ SOLN
INTRAMUSCULAR | Status: AC
  Filled 2019-11-30: qty 2

## 2019-11-30 MED ORDER — DIPHENHYDRAMINE HCL 12.5 MG/5ML PO ELIX: 12.5000 mg | ORAL_SOLUTION | Freq: Four times a day (QID) | ORAL | Status: DC | PRN

## 2019-11-30 MED ORDER — BUPIVACAINE-EPINEPHRINE 0.5% -1:200000 IJ SOLN
INTRAMUSCULAR | Status: DC | PRN
  Administered 2019-11-30: 20 mL

## 2019-11-30 MED ORDER — HYDROCODONE-ACETAMINOPHEN 5-325 MG PO TABS: 1.0000 | ORAL_TABLET | Freq: Four times a day (QID) | ORAL | 0 refills | Status: DC | PRN

## 2019-11-30 MED ORDER — SODIUM CHLORIDE (PF) 0.9 % IJ SOLN
INTRAMUSCULAR | Status: AC
  Filled 2019-11-30: qty 20

## 2019-11-30 MED ORDER — INSULIN ASPART 100 UNIT/ML ~~LOC~~ SOLN
0.0000 [IU] | SUBCUTANEOUS | Status: DC
  Administered 2019-11-30: 3 [IU] via SUBCUTANEOUS
  Administered 2019-11-30 – 2019-12-03 (×6): 2 [IU] via SUBCUTANEOUS
  Administered 2019-12-03 (×2): 3 [IU] via SUBCUTANEOUS
  Administered 2019-12-03 – 2019-12-04 (×4): 2 [IU] via SUBCUTANEOUS

## 2019-11-30 SURGICAL SUPPLY — 63 items
APPLICATOR SURGIFLO ENDO (HEMOSTASIS) IMPLANT
BAG ISOLATION DRAPE 18X18 (DRAPES) IMPLANT
BLADE EXTENDED COATED 6.5IN (ELECTRODE) ×1 IMPLANT
CHLORAPREP W/TINT 26 (MISCELLANEOUS) ×2 IMPLANT
CLIP VESOLOCK LG 6/CT PURPLE (CLIP) ×2 IMPLANT
CLIP VESOLOCK MED LG 6/CT (CLIP) ×4 IMPLANT
COVER SURGICAL LIGHT HANDLE (MISCELLANEOUS) ×2 IMPLANT
COVER TIP SHEARS 8 DVNC (MISCELLANEOUS) ×1 IMPLANT
COVER TIP SHEARS 8MM DA VINCI (MISCELLANEOUS) ×1
COVER WAND RF STERILE (DRAPES) IMPLANT
DECANTER SPIKE VIAL GLASS SM (MISCELLANEOUS) ×2 IMPLANT
DERMABOND ADVANCED (GAUZE/BANDAGES/DRESSINGS) ×2
DERMABOND ADVANCED .7 DNX12 (GAUZE/BANDAGES/DRESSINGS) ×2 IMPLANT
DISSECTOR ROUND CHERRY 3/8 STR (MISCELLANEOUS) ×1 IMPLANT
DRAIN CHANNEL 15F RND FF 3/16 (WOUND CARE) ×2 IMPLANT
DRAPE ARM DVNC X/XI (DISPOSABLE) ×4 IMPLANT
DRAPE COLUMN DVNC XI (DISPOSABLE) ×1 IMPLANT
DRAPE DA VINCI XI ARM (DISPOSABLE) ×4
DRAPE DA VINCI XI COLUMN (DISPOSABLE) ×1
DRAPE INCISE IOBAN 66X45 STRL (DRAPES) ×2 IMPLANT
DRAPE ISOLATION BAG 18X18 (DRAPES) ×1
DRAPE SHEET LG 3/4 BI-LAMINATE (DRAPES) ×2 IMPLANT
DRSG TEGADERM 4X4.75 (GAUZE/BANDAGES/DRESSINGS) ×1 IMPLANT
ELECT PENCIL ROCKER SW 15FT (MISCELLANEOUS) ×2 IMPLANT
ELECT REM PT RETURN 15FT ADLT (MISCELLANEOUS) ×2 IMPLANT
EVACUATOR SILICONE 100CC (DRAIN) ×2 IMPLANT
GLOVE BIO SURGEON STRL SZ 6.5 (GLOVE) ×2 IMPLANT
GLOVE BIOGEL M STRL SZ7.5 (GLOVE) ×4 IMPLANT
GOWN STRL REUS W/TWL LRG LVL3 (GOWN DISPOSABLE) ×6 IMPLANT
HEMOSTAT SURGICEL 4X8 (HEMOSTASIS) IMPLANT
IRRIG SUCT STRYKERFLOW 2 WTIP (MISCELLANEOUS) ×2
IRRIGATION SUCT STRKRFLW 2 WTP (MISCELLANEOUS) ×1 IMPLANT
KIT BASIN (CUSTOM PROCEDURE TRAY) ×2 IMPLANT
KIT TURNOVER KIT A (KITS) IMPLANT
LOOP VESSEL MAXI BLUE (MISCELLANEOUS) ×1 IMPLANT
PENCIL SMOKE EVACUATOR (MISCELLANEOUS) IMPLANT
POUCH SPECIMEN RETRIEVAL 10MM (ENDOMECHANICALS) ×1 IMPLANT
PROTECTOR NERVE ULNAR (MISCELLANEOUS) ×4 IMPLANT
SEAL CANN UNIV 5-8 DVNC XI (MISCELLANEOUS) ×4 IMPLANT
SEAL XI 5MM-8MM UNIVERSAL (MISCELLANEOUS) ×4
SET TUBE SMOKE EVAC HIGH FLOW (TUBING) ×2 IMPLANT
SOLUTION ELECTROLUBE (MISCELLANEOUS) ×2 IMPLANT
SPONGE LAP 18X18 RF (DISPOSABLE) ×2 IMPLANT
SURGIFLO W/THROMBIN 8M KIT (HEMOSTASIS) ×1 IMPLANT
SUT ETHILON 3 0 PS 1 (SUTURE) ×2 IMPLANT
SUT MNCRL AB 4-0 PS2 18 (SUTURE) ×4 IMPLANT
SUT PDS AB 1 TP1 96 (SUTURE) ×2 IMPLANT
SUT V-LOC BARB 180 2/0GR6 GS22 (SUTURE) ×4
SUT VIC AB 0 CT1 27 (SUTURE) ×2
SUT VIC AB 0 CT1 27XBRD ANTBC (SUTURE) ×1 IMPLANT
SUT VIC AB 2-0 SH 27 (SUTURE) ×1
SUT VIC AB 2-0 SH 27X BRD (SUTURE) IMPLANT
SUT VICRYL 0 UR6 27IN ABS (SUTURE) ×3 IMPLANT
SUT VLOC BARB 180 ABS3/0GR12 (SUTURE) ×2
SUTURE V-LC BRB 180 2/0GR6GS22 (SUTURE) ×1 IMPLANT
SUTURE VLOC BRB 180 ABS3/0GR12 (SUTURE) ×1 IMPLANT
TOWEL OR 17X26 10 PK STRL BLUE (TOWEL DISPOSABLE) ×2 IMPLANT
TOWEL OR NON WOVEN STRL DISP B (DISPOSABLE) ×2 IMPLANT
TRAY FOLEY MTR SLVR 16FR STAT (SET/KITS/TRAYS/PACK) ×1 IMPLANT
TRAY LAPAROSCOPIC (CUSTOM PROCEDURE TRAY) ×2 IMPLANT
TROCAR XCEL 12X100 BLDLESS (ENDOMECHANICALS) ×2 IMPLANT
WATER STERILE IRR 1000ML POUR (IV SOLUTION) ×3 IMPLANT
YANKAUER SUCT BULB TIP 10FT TU (MISCELLANEOUS) ×1 IMPLANT

## 2019-11-30 NOTE — Discharge Instructions (Signed)

## 2019-11-30 NOTE — Anesthesia Procedure Notes (Signed)
Procedure Name: Intubation Date/Time: 11/30/2019 11:41 AM Performed by: Talbot Grumbling, CRNA Pre-anesthesia Checklist: Patient identified, Emergency Drugs available, Suction available and Patient being monitored Patient Re-evaluated:Patient Re-evaluated prior to induction Oxygen Delivery Method: Circle system utilized Preoxygenation: Pre-oxygenation with 100% oxygen Induction Type: IV induction Ventilation: Mask ventilation without difficulty Laryngoscope Size: 3 and Mac Grade View: Grade II Tube type: Oral Tube size: 7.5 mm Number of attempts: 1 Airway Equipment and Method: Stylet Placement Confirmation: ETT inserted through vocal cords under direct vision,  positive ETCO2 and breath sounds checked- equal and bilateral Secured at: 21 cm Tube secured with: Tape Dental Injury: Teeth and Oropharynx as per pre-operative assessment

## 2019-11-30 NOTE — Progress Notes (Signed)
Patient ID: Bonnie Torres, female   DOB: July 08, 1955, 64 y.o.   MRN: [id number]  Post-op note  Subjective: The patient is doing well.  No complaints.  Objective: Vital signs in last 24 hours: Temp:  [97.5 F (36.4 C)-98.4 F (36.9 C)] 98 F (36.7 C) (06/03 1700) Pulse Rate:  [72-85] 84 (06/03 1638) Resp:  [12-22] 15 (06/03 1638) BP: (110-143)/(77-89) 117/83 (06/03 1638) SpO2:  [96 %-99 %] 96 % (06/03 1616) Weight:  [72.9 kg] 72.9 kg (06/03 1638)  Intake/Output from previous day: No intake/output data recorded. Intake/Output this shift: Total I/O In: 2397.5 [I.V.:2297.5; IV Piggyback:100] Out: 670 [Urine:500; Drains:70; Blood:100]  Physical Exam:  General: Alert and oriented. Abdomen: Soft, Nondistended. Incisions: Clean and dry.  Lab Results: Recent Labs    11/30/19 1502  HGB 10.8*  HCT 31.8*    Assessment/Plan: POD#0   1) Continue to monitor   Pryor Curia. MD   LOS: 0 days   Bonnie Torres 11/30/2019, 5:59 PM

## 2019-11-30 NOTE — Anesthesia Postprocedure Evaluation (Signed)
Anesthesia Post Note  Patient: Bonnie Torres  Procedure(s) Performed: ATTEMPTED XI ROBOTIC ASSITED LAPAROSCOPIC CONVERTED TO OPEN PARTIAL NEPHRECTOMY (Left )     Patient location during evaluation: PACU Anesthesia Type: General Level of consciousness: awake and alert Pain management: pain level controlled Vital Signs Assessment: post-procedure vital signs reviewed and stable Respiratory status: spontaneous breathing, nonlabored ventilation, respiratory function stable and patient connected to nasal cannula oxygen Cardiovascular status: blood pressure returned to baseline and stable Postop Assessment: no apparent nausea or vomiting Anesthetic complications: no    Last Vitals:  Vitals:   11/30/19 1616 11/30/19 1638  BP: 117/83 117/83  Pulse: 84 84  Resp: 15 15  Temp: (!) 36.4 C (!) 36.4 C  SpO2: 96%     Last Pain:  Vitals:   11/30/19 1638  TempSrc: Oral  PainSc:                  [name] DAVID

## 2019-11-30 NOTE — Op Note (Signed)
Preoperative diagnosis: 1.  Left renal neoplasm 2.  Complex left renal cyst 3.  History of renal cell carcinoma  Postoperative diagnosis: 1.  Left renal neoplasm 2.  Complex left renal cyst 3.  History of renal cell carcinoma  Procedures: Left partial nephrectomy with intraoperative ultrasound of the left kidney  Surgeon: Pryor Curia MD  Assistant: Debbrah Alar, PA-C  An assistant was required for this surgical procedure.  The duties of the assistant included but were not limited to suctioning, passing suture, camera manipulation, retraction. This procedure would not be able to be performed without an Environmental consultant.  Anesthesia: General  EBL: 123XX123 cc  Complications: None  Intravenous fluids: 2000 cc of crystalloid  Specimens: 1.  Left renal mass 2.  Perinephric fat  Disposition of specimens: Pathology  Intraoperative findings: Intraoperative ultrasound was utilized to image the kidney.  The left upper pole complex renal mass was identified and measured approximately 1.6 cm with complexity including multiple septations.  In the interpolar region of the kidney, and endophytic tumor was identified that measured approximately 1.5 cm.  This appeared to be consistent with a simple cyst on ultrasound.  Indication: Bonnie Torres is a 64 year old female with a history of renal cell carcinoma status post right partial nephrectomy.  On follow-up surveillance imaging, she was noted to have a complex upper pole left renal cystic lesion, concerning for a cystic renal cell carcinoma.  She was also noted to have a minimally complex cyst that was endophytic in the midportion of the interpolar kidney.  After reviewing options, she elected to proceed with surgical therapy.  Considering her prior surgical history with a large midline incision, she was prepared that she may require an open partial nephrectomy although we did agree to make an attempt at a minimally invasive operation.  The potential  risks, complications, and the expected recovery process were discussed in detail.  Informed consent was obtained.  Description of procedure: The patient was taken to the operating room and a general anesthetic was administered.  She was given preoperative antibiotics, placed in the left modified flank position, and prepped and draped in the usual sterile fashion.  A preoperative timeout was performed.  An incision was made in the upper midline along her prior midline incision.  This was carried down through the subcutaneous tissues.  The fascia was identified and was scored.  Entry below the fascia revealed significant adhesions.  Careful finger dissection was used to create a small space and a trocar was placed.  Direct visualization was then performed with a 30 degree lens.  This revealed what appeared to be significant adhesions with bowel adhered to the upper abdominal wall.  It was felt that entry laterally also would not be possible based on what was visualized through this incision.  It was therefore decided to make a left subcostal incision and proceed with an open partial nephrectomy.  An incision was then made approximately 2 cm below the costal margin and carried down toward the tip of the eleventh rib.  This was then carried down through the subcutaneous tissues and the anterior fascia layers of the abdominal wall were entered.  The underlying muscular layers were then divided.  The internal oblique and transversus muscle layers were then similarly divided in the posterior sheath was entered sharply laterally and there were noted to be no significant adhesions in this area.  The incision was then opened along the length of it.  A self-retaining Bookwalter retractor was placed.  Attention then turned to mobilization of the descending colon.  Using a combination of careful dissection and cautery, the white line of Toldt was incised up toward the splenic flexure allowing the colon to be mobilized  medially and off of the spleen.  The kidney was easily palpated and as the kidney was mobilized medially and the space between the colon mesentery and the anterior layer of Gerota's fascia was created further self-retaining retractors were placed thereby exposing the kidney.  The spleen was also carefully mobilized with careful dissection to take down the splenorenal ligaments.  The posterior and lateral attachments to the kidney were taken down with a combination of blunt and cautery dissection.  The upper pole tumor was easily identified and appeared to be exophytic off the anterior aspect of the kidney.  The ureter was identified inferiorly and was protected.  The renal vein was easily identified and once the renal hilum was isolated and the kidney was freely mobile, attention turned to dissection of the renal hilum.  The renal artery was identified just superior and posterior to the renal vein.  Utilizing right angle dissection, this was able to be isolated and a vessel loop was placed around the renal artery for identification purposes.  Similarly, the renal vein was also isolated and a separate vessel loop was placed around the renal vein.  The perinephric fat over the majority of the kidney was then carefully removed.  Intraoperative ultrasound was then performed to carefully visualize the kidney.  She had been noted to have a small endophytic Bosniak 28F lesion on her recent MRI and this was further evaluated with ultrasound.  This appeared to be consistent with a simple cyst.  The main lesion of concern of the anterior upper pole of the kidney was also visualized under ultrasound and measured approximately 1.6 cm.  This had multiple septations concerning for a complex cystic lesion and again had appeared to be consistent with a cystic renal cell carcinoma on prior imaging studies.  Preparations were then made for the partial nephrectomy.  The perinephric fat surrounding the upper pole tumor was removed  and sent as a specimen.  The renal artery was first clamped using a bulldog clamp.  The renal artery was then subsequently clamped as well.  The tumor was then resected using Metzenbaum scissors to sharply incise the kidney with an adequate margin around the tumor.  Once the tumor was completely excised and appeared to be adequately resected grossly, attention turned to repair of the kidney defect.  A 3-0 V-Loc suture was brought through the renal capsule and was then used to perform a running suture repair of the renal parenchymal defect.  This resulted in what appeared to be good hemostasis.  A running 2-0 V-Loc suture was then used to perform a horizontal mattress repair of the renal capsule with additional clips placed to secure this suture in place.  The renal vein was then unclamped as was the renal artery subsequently.  Total warm ischemia was 11 minutes.  There did appear to be some bleeding from the renal defect.  An additional 2-0 V-Loc suture was placed through the renal capsule in a figure-of-eight fashion until adequate hemostasis was achieved.  Surgi-Flo had been placed into the renal defect prior to completion of the repair.  Additional Surgi-Flo was now placed over the surface of the defect and pressure was applied with a Ray-Tec sponge.  The surgical field was then inspected.  Hemostasis appeared excellent.  A #19 Keenan Bachelor  drain was placed through a separate stab incision in the lower abdominal wall and placed in the perinephric space.  The colon then was replaced into its normal anatomic position.  Attention then turned to closure of the wound.  60 cc of Exparel was injected into the abdominal wall and subcutaneous tissues.  The posterior layer of the muscular abdominal wall was then closed with a running #1 PDS looped suture.  The anterior layer was then also closed with a separate running #1 PDS suture.  A subcutaneous running Vicryl closure was then performed followed by a 4-0 Monocryl subcuticular  closure of the skin.  Dermabond was applied to the incision.  The initial small midline incision was then examined.  The fascial defect that had been created was closed with a figure-of-eight 0 Vicryl suture.  The skin at this site was also closed with a 4-0 Monocryl subcuticular closure.  Dermabond was also applied.  The patient appeared to tolerate the procedure well without complications.  She was able to be extubated and transferred to the recovery unit in satisfactory condition.

## 2019-11-30 NOTE — Transfer of Care (Signed)
Immediate Anesthesia Transfer of Care Note  Patient: Bonnie Torres  Procedure(s) Performed: ATTEMPTED XI ROBOTIC ASSITED LAPAROSCOPIC CONVERTED TO OPEN PARTIAL NEPHRECTOMY (Left )  Patient Location: PACU  Anesthesia Type:General  Level of Consciousness: sedated  Airway & Oxygen Therapy: Patient Spontanous Breathing and Patient connected to face mask oxygen  Post-op Assessment: Report given to RN and Post -op Vital signs reviewed and stable  Post vital signs: Reviewed and stable  Last Vitals:  Vitals Value Taken Time  BP 134/82 11/30/19 1442  Temp    Pulse 80 11/30/19 1443  Resp 20 11/30/19 1443  SpO2 99 % 11/30/19 1443  Vitals shown include unvalidated device data.  Last Pain:  Vitals:   11/30/19 0928  TempSrc: Oral         Complications: No apparent anesthesia complications

## 2019-11-30 NOTE — Anesthesia Preprocedure Evaluation (Signed)
Anesthesia Evaluation  Patient identified by MRN, date of birth, ID band Patient awake    Reviewed: Allergy & Precautions, NPO status , Patient's Chart, lab work & pertinent test results  History of Anesthesia Complications (+) PONV  Airway Mallampati: I  TM Distance: >3 FB Neck ROM: Full    Dental   Pulmonary    Pulmonary exam normal        Cardiovascular hypertension, Pt. on medications Normal cardiovascular exam     Neuro/Psych    GI/Hepatic   Endo/Other  diabetes, Type 2  Renal/GU      Musculoskeletal   Abdominal   Peds  Hematology   Anesthesia Other Findings   Reproductive/Obstetrics                             Anesthesia Physical Anesthesia Plan  ASA: II  Anesthesia Plan: General   Post-op Pain Management:    Induction: Intravenous  PONV Risk Score and Plan: 4 or greater and Scopolamine patch - Pre-op, Ondansetron, Midazolam and Treatment may vary due to age or medical condition  Airway Management Planned: Oral ETT  Additional Equipment:   Intra-op Plan:   Post-operative Plan: Extubation in OR  Informed Consent: I have reviewed the patients History and Physical, chart, labs and discussed the procedure including the risks, benefits and alternatives for the proposed anesthesia with the patient or authorized representative who has indicated his/her understanding and acceptance.       Plan Discussed with: CRNA and Surgeon  Anesthesia Plan Comments:         Anesthesia Quick Evaluation

## 2019-12-01 LAB — SURGICAL PATHOLOGY

## 2019-12-01 LAB — GLUCOSE, CAPILLARY
Glucose-Capillary: 118 mg/dL — ABNORMAL HIGH (ref 70–99)
Glucose-Capillary: 97 mg/dL (ref 70–99)
Glucose-Capillary: 114 mg/dL — ABNORMAL HIGH (ref 70–99)
Glucose-Capillary: 99 mg/dL (ref 70–99)
Glucose-Capillary: 107 mg/dL — ABNORMAL HIGH (ref 70–99)

## 2019-12-01 LAB — HEMOGLOBIN AND HEMATOCRIT, BLOOD
HCT: 34.3 % — ABNORMAL LOW (ref 36.0–46.0)
Hemoglobin: 11.8 g/dL — ABNORMAL LOW (ref 12.0–15.0)

## 2019-12-01 MED ORDER — ZOLPIDEM TARTRATE 5 MG PO TABS
5.0000 mg | ORAL_TABLET | Freq: Every day | ORAL | Status: DC
  Administered 2019-12-01 – 2019-12-04 (×3): 5 mg via ORAL
  Filled 2019-12-01 (×4): qty 1

## 2019-12-01 MED ORDER — HYDROCHLOROTHIAZIDE 12.5 MG PO CAPS
12.5000 mg | ORAL_CAPSULE | Freq: Every day | ORAL | Status: DC
  Administered 2019-12-01 – 2019-12-05 (×3): 12.5 mg via ORAL
  Filled 2019-12-01 (×3): qty 1

## 2019-12-01 MED ORDER — IRBESARTAN 150 MG PO TABS
150.0000 mg | ORAL_TABLET | Freq: Every day | ORAL | Status: DC
  Administered 2019-12-01 – 2019-12-05 (×3): 150 mg via ORAL
  Filled 2019-12-01 (×3): qty 1

## 2019-12-01 MED ORDER — METOPROLOL SUCCINATE ER 100 MG PO TB24
100.0000 mg | ORAL_TABLET | Freq: Every day | ORAL | Status: DC
  Administered 2019-12-01: 100 mg via ORAL
  Filled 2019-12-01: qty 1

## 2019-12-01 MED ORDER — HYDROCODONE-ACETAMINOPHEN 5-325 MG PO TABS
1.0000 | ORAL_TABLET | Freq: Four times a day (QID) | ORAL | Status: DC | PRN
  Administered 2019-12-01: 1 via ORAL
  Filled 2019-12-01: qty 1

## 2019-12-01 MED ORDER — OXYCODONE HCL 5 MG PO TABS: 5.0000 mg | ORAL_TABLET | ORAL | Status: DC | PRN

## 2019-12-01 MED ORDER — BISACODYL 10 MG RE SUPP
10.0000 mg | Freq: Once | RECTAL | Status: AC
  Administered 2019-12-01: 10 mg via RECTAL
  Filled 2019-12-01: qty 1

## 2019-12-01 MED ORDER — OLMESARTAN-AMLODIPINE-HCTZ 20-5-12.5 MG PO TABS: 1.0000 | ORAL_TABLET | Freq: Every day | ORAL | Status: DC

## 2019-12-01 MED ORDER — PRAVASTATIN SODIUM 40 MG PO TABS
40.0000 mg | ORAL_TABLET | Freq: Every day | ORAL | Status: DC
  Administered 2019-12-01 – 2019-12-04 (×2): 40 mg via ORAL
  Filled 2019-12-01 (×3): qty 1

## 2019-12-01 MED ORDER — AMLODIPINE BESYLATE 5 MG PO TABS
5.0000 mg | ORAL_TABLET | Freq: Every day | ORAL | Status: DC
  Administered 2019-12-01 – 2019-12-05 (×3): 5 mg via ORAL
  Filled 2019-12-01 (×3): qty 1

## 2019-12-01 NOTE — Progress Notes (Signed)
Patient ID: Bonnie Torres, female   DOB: Oct 13, 1955, 64 y.o.   MRN: [id number]  1 Day Post-Op Subjective: Pt with adequate pain control.  No nausea or vomiting.  Objective: Vital signs in last 24 hours: Temp:  [97.5 F (36.4 C)-98.7 F (37.1 C)] 98.7 F (37.1 C) (06/04 0701) Pulse Rate:  [69-89] 89 (06/04 0701) Resp:  [12-22] 18 (06/04 0701) BP: (110-143)/(77-89) 132/85 (06/04 0701) SpO2:  [96 %-100 %] 97 % (06/04 0701) Weight:  [72.9 kg] 72.9 kg (06/03 1638)  Intake/Output from previous day: 06/03 0701 - 06/04 0700 In: 4775.7 [P.O.:480; I.V.:3795.8; IV Piggyback:499.8] Out: 1575 [Urine:1400; Drains:75; Blood:100] Intake/Output this shift: No intake/output data recorded.  Physical Exam:  General: Alert and oriented CV: RRR Lungs: Clear Abdomen: Soft, ND, minimal BS Incisions: C/D/I Ext: NT, No erythema  Lab Results: Recent Labs    11/30/19 1502 12/01/19 0451  HGB 10.8* 11.8*  HCT 31.8* 34.3*   BMET Recent Labs    11/30/19 1502  NA 138  K 3.7  CL 107  CO2 22  GLUCOSE 124*  BUN 7*  CREATININE 0.68  CALCIUM 7.8*     Studies/Results: Pathology: pending  Assessment/Plan: POD#1 s/p left open partial nephrectomy - Ambulate, IS, DVT prophylaxis - Advance diet as tolerated - Oral and IV pain medication - D/C Foley - Monitor renal function, drain output - Path pending   LOS: 1 day   Bonnie Torres 12/01/2019, 7:31 AM

## 2019-12-02 LAB — BASIC METABOLIC PANEL
Anion gap: 9 (ref 5–15)
BUN: 8 mg/dL (ref 8–23)
CO2: 26 mmol/L (ref 22–32)
Calcium: 9.2 mg/dL (ref 8.9–10.3)
Chloride: 100 mmol/L (ref 98–111)
Creatinine, Ser: 0.84 mg/dL (ref 0.44–1.00)
GFR calc Af Amer: >60 mL/min (ref >60)
GFR calc non Af Amer: >60 mL/min (ref >60)
Glucose, Bld: 129 mg/dL — ABNORMAL HIGH (ref 70–99)
Potassium: 3.2 mmol/L — ABNORMAL LOW (ref 3.5–5.1)
Sodium: 135 mmol/L (ref 135–145)

## 2019-12-02 LAB — GLUCOSE, CAPILLARY
Glucose-Capillary: 112 mg/dL — ABNORMAL HIGH (ref 70–99)
Glucose-Capillary: 114 mg/dL — ABNORMAL HIGH (ref 70–99)
Glucose-Capillary: 119 mg/dL — ABNORMAL HIGH (ref 70–99)
Glucose-Capillary: 122 mg/dL — ABNORMAL HIGH (ref 70–99)
Glucose-Capillary: 123 mg/dL — ABNORMAL HIGH (ref 70–99)
Glucose-Capillary: 133 mg/dL — ABNORMAL HIGH (ref 70–99)
Glucose-Capillary: 96 mg/dL (ref 70–99)

## 2019-12-02 LAB — HEMOGLOBIN AND HEMATOCRIT, BLOOD
HCT: 38.3 % (ref 36.0–46.0)
Hemoglobin: 13.2 g/dL (ref 12.0–15.0)

## 2019-12-02 LAB — CREATININE, FLUID (PLEURAL, PERITONEAL, JP DRAINAGE): Creat, Fluid: 0.8 mg/dL

## 2019-12-02 MED ORDER — PROMETHAZINE HCL 25 MG/ML IJ SOLN
12.5000 mg | Freq: Four times a day (QID) | INTRAMUSCULAR | Status: DC | PRN
  Administered 2019-12-02 – 2019-12-03 (×4): 12.5 mg via INTRAVENOUS
  Filled 2019-12-02 (×4): qty 1

## 2019-12-02 NOTE — Progress Notes (Signed)
Patient did have one episode of emesis, moderate amount of yellow liquid.  Zofran was administered to patient.  Will continue to monitor at this time, patient states she feels slightly better since vomiting.

## 2019-12-02 NOTE — Progress Notes (Signed)
Patient with nausea this evening. Evening medications held per request of patient. Phenergan given at 1851. Will continue to monitor closely.

## 2019-12-02 NOTE — Progress Notes (Signed)
Patient ID: Bonnie Torres, female   DOB: 26-Mar-1956, 64 y.o.   MRN: [id number]   Pt doing well.  Has been able to ambulate today in halls.  Pain tolerable with mostly oral medication.  Tolerating diet.  No flatus yet.  Incision clean and dry.  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  Will send JP for Cr in AM and ok to remove if serum.  Once ambulating well with oral meds, tolerating solid diet, etc., will be ok for discharge home.

## 2019-12-02 NOTE — Progress Notes (Signed)
Patient stated she felt nauseous; Zofran administered at beginning of shift.  Patient had 2 episodes of emesis after zofran administration of clear yellow liquid.  Dr. Claudia Desanctis on call, notified, new orders for phenergan, phenergan administered.  Will continue to monitor.

## 2019-12-02 NOTE — Progress Notes (Signed)
Patient had another episode of emesis, small amount of yellow liquid.  Dr. Claudia Desanctis notified.  No new orders at this time.  Will continue to monitor.

## 2019-12-02 NOTE — Progress Notes (Signed)
Patient JP bulb occluded by large blood clot.  Unable to drain blood clot and clot preventing bulb from being charged.  OR sent new JP bulb; replaced JP bulb and drain is now functioning.  Will continue to monitor.

## 2019-12-02 NOTE — Progress Notes (Signed)
2 Days Post-Op Subjective: Patient continuing to ambulate.  No flatus.  Tolerating clears.    Objective: Vital signs in last 24 hours: Temp:  [97.2 F (36.2 C)-99.5 F (37.5 C)] 97.2 F (36.2 C) (06/05 0448) Pulse Rate:  [75-96] 96 (06/05 0448) Resp:  [16-20] 20 (06/05 0448) BP: (129-140)/(76-87) 129/87 (06/05 0448) SpO2:  [95 %-97 %] 96 % (06/05 0448)  Intake/Output from previous day: 06/04 0701 - 06/05 0700 In: 1260.3 [I.V.:1160.3; IV Piggyback:100] Out: 925 [Urine:750; Drains:175]  Physical Exam:  General: Alert and oriented CV: RRR Lungs: Clear Abdomen: Soft, ND Incisions: c/d/i, JP with SS output Ext: NT, No erythema  Lab Results: Recent Labs    11/30/19 1502 12/01/19 0451 12/02/19 0534  HGB 10.8* 11.8* 13.2  HCT 31.8* 34.3* 38.3   BMET Recent Labs    11/30/19 1502 12/02/19 0534  NA 138 135  K 3.7 3.2*  CL 107 100  CO2 22 26  GLUCOSE 124* 129*  BUN 7* 8  CREATININE 0.68 0.84  CALCIUM 7.8* 9.2     Studies/Results: No results found.  Assessment/Plan: 64 yo woman with left renal mass POD2 s/p left open partial nephrectomy.  Creatinine and Hgb stable.  Awaiting return of bowel function.  -advance diet as tolerated -ambulate, continue SCDs while in bed, IS -pain meds PRN -foley has been removed -JP fluid Cr pending, if consistent with serum will d/c -d/c home pending removal of drain and ability to tolerate diet (possibly tomorrow)   LOS: 2 days   Gracious Renken D Pawel Soules 12/02/2019, 7:23 AM

## 2019-12-03 ENCOUNTER — Inpatient Hospital Stay (HOSPITAL_COMMUNITY): Payer: Medicare Other

## 2019-12-03 LAB — CBC WITH DIFFERENTIAL/PLATELET
Abs Immature Granulocytes: 0.15 10*3/uL — ABNORMAL HIGH (ref 0.00–0.07)
Basophils Absolute: 0 10*3/uL (ref 0.0–0.1)
Basophils Relative: 0 %
Eosinophils Absolute: 0 10*3/uL (ref 0.0–0.5)
Eosinophils Relative: 0 %
HCT: 37.5 % (ref 36.0–46.0)
Hemoglobin: 13.2 g/dL (ref 12.0–15.0)
Immature Granulocytes: 1 %
Lymphocytes Relative: 18 %
Lymphs Abs: 2.3 10*3/uL (ref 0.7–4.0)
MCH: 26.2 pg (ref 26.0–34.0)
MCHC: 35.2 g/dL (ref 30.0–36.0)
MCV: 74.6 fL — ABNORMAL LOW (ref 80.0–100.0)
Monocytes Absolute: 1 10*3/uL (ref 0.1–1.0)
Monocytes Relative: 8 %
Neutro Abs: 9 10*3/uL — ABNORMAL HIGH (ref 1.7–7.7)
Neutrophils Relative %: 73 %
Platelets: 314 10*3/uL (ref 150–400)
RBC: 5.03 MIL/uL (ref 3.87–5.11)
RDW: 13.9 % (ref 11.5–15.5)
WBC: 12.5 10*3/uL — ABNORMAL HIGH (ref 4.0–10.5)
nRBC: 0 % (ref 0.0–0.2)

## 2019-12-03 LAB — BASIC METABOLIC PANEL
Anion gap: 14 (ref 5–15)
BUN: 16 mg/dL (ref 8–23)
CO2: 23 mmol/L (ref 22–32)
Calcium: 8.8 mg/dL — ABNORMAL LOW (ref 8.9–10.3)
Chloride: 103 mmol/L (ref 98–111)
Creatinine, Ser: 0.88 mg/dL (ref 0.44–1.00)
GFR calc Af Amer: >60 mL/min (ref >60)
GFR calc non Af Amer: >60 mL/min (ref >60)
Glucose, Bld: 151 mg/dL — ABNORMAL HIGH (ref 70–99)
Potassium: 3.3 mmol/L — ABNORMAL LOW (ref 3.5–5.1)
Sodium: 140 mmol/L (ref 135–145)

## 2019-12-03 LAB — GLUCOSE, CAPILLARY
Glucose-Capillary: 137 mg/dL — ABNORMAL HIGH (ref 70–99)
Glucose-Capillary: 158 mg/dL — ABNORMAL HIGH (ref 70–99)
Glucose-Capillary: 131 mg/dL — ABNORMAL HIGH (ref 70–99)
Glucose-Capillary: 121 mg/dL — ABNORMAL HIGH (ref 70–99)
Glucose-Capillary: 129 mg/dL — ABNORMAL HIGH (ref 70–99)

## 2019-12-03 IMAGING — DX DG CHEST 1V PORT
1 series · 1 of 1 positions shown · non-contrast
Comparison: [DATE]

CLINICAL DATA: Right-sided pain status post recent partial right
nephrectomy.

EXAM:
PORTABLE CHEST 1 VIEW

[chest ap]
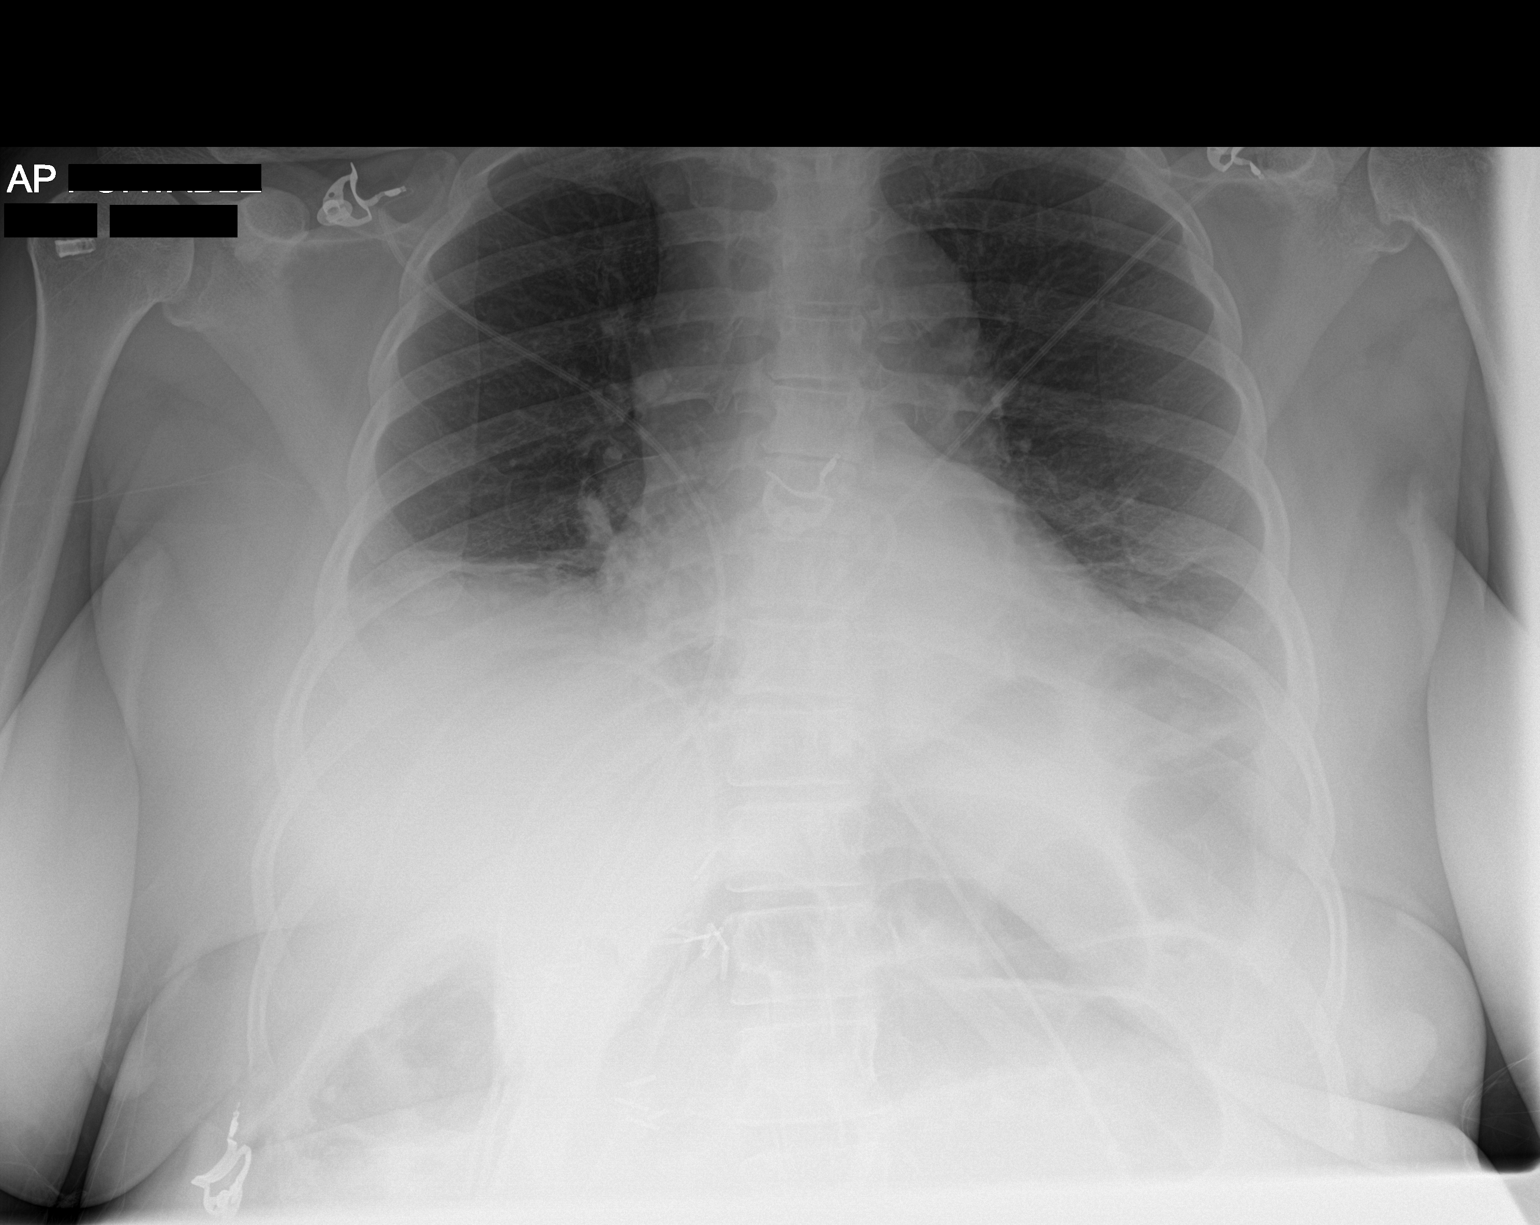

[1 of 1 positions shown; findings below may reference images not displayed]

FINDINGS: Decreased lung volumes are seen which is likely secondary to the
degree of patient inspiration. Mild areas of atelectasis and/or
infiltrate are seen within the bilateral lung bases. There is no
evidence of a pleural effusion or pneumothorax. The heart size and
mediastinal contours are within normal limits. Radiopaque surgical
clips are seen along the medial aspect of the mid and upper right
abdomen. The visualized skeletal structures are unremarkable.
IMPRESSION: Mild bibasilar atelectasis and/or infiltrate.

## 2019-12-03 IMAGING — DX DG ABDOMEN 1V
2 series · 2 of 2 positions shown · non-contrast
Comparison: CT abdomen and pelvis [DATE]

CLINICAL DATA: Postoperative ileus

EXAM:
ABDOMEN - 1 VIEW

[abdomen kub (1 of 2)]
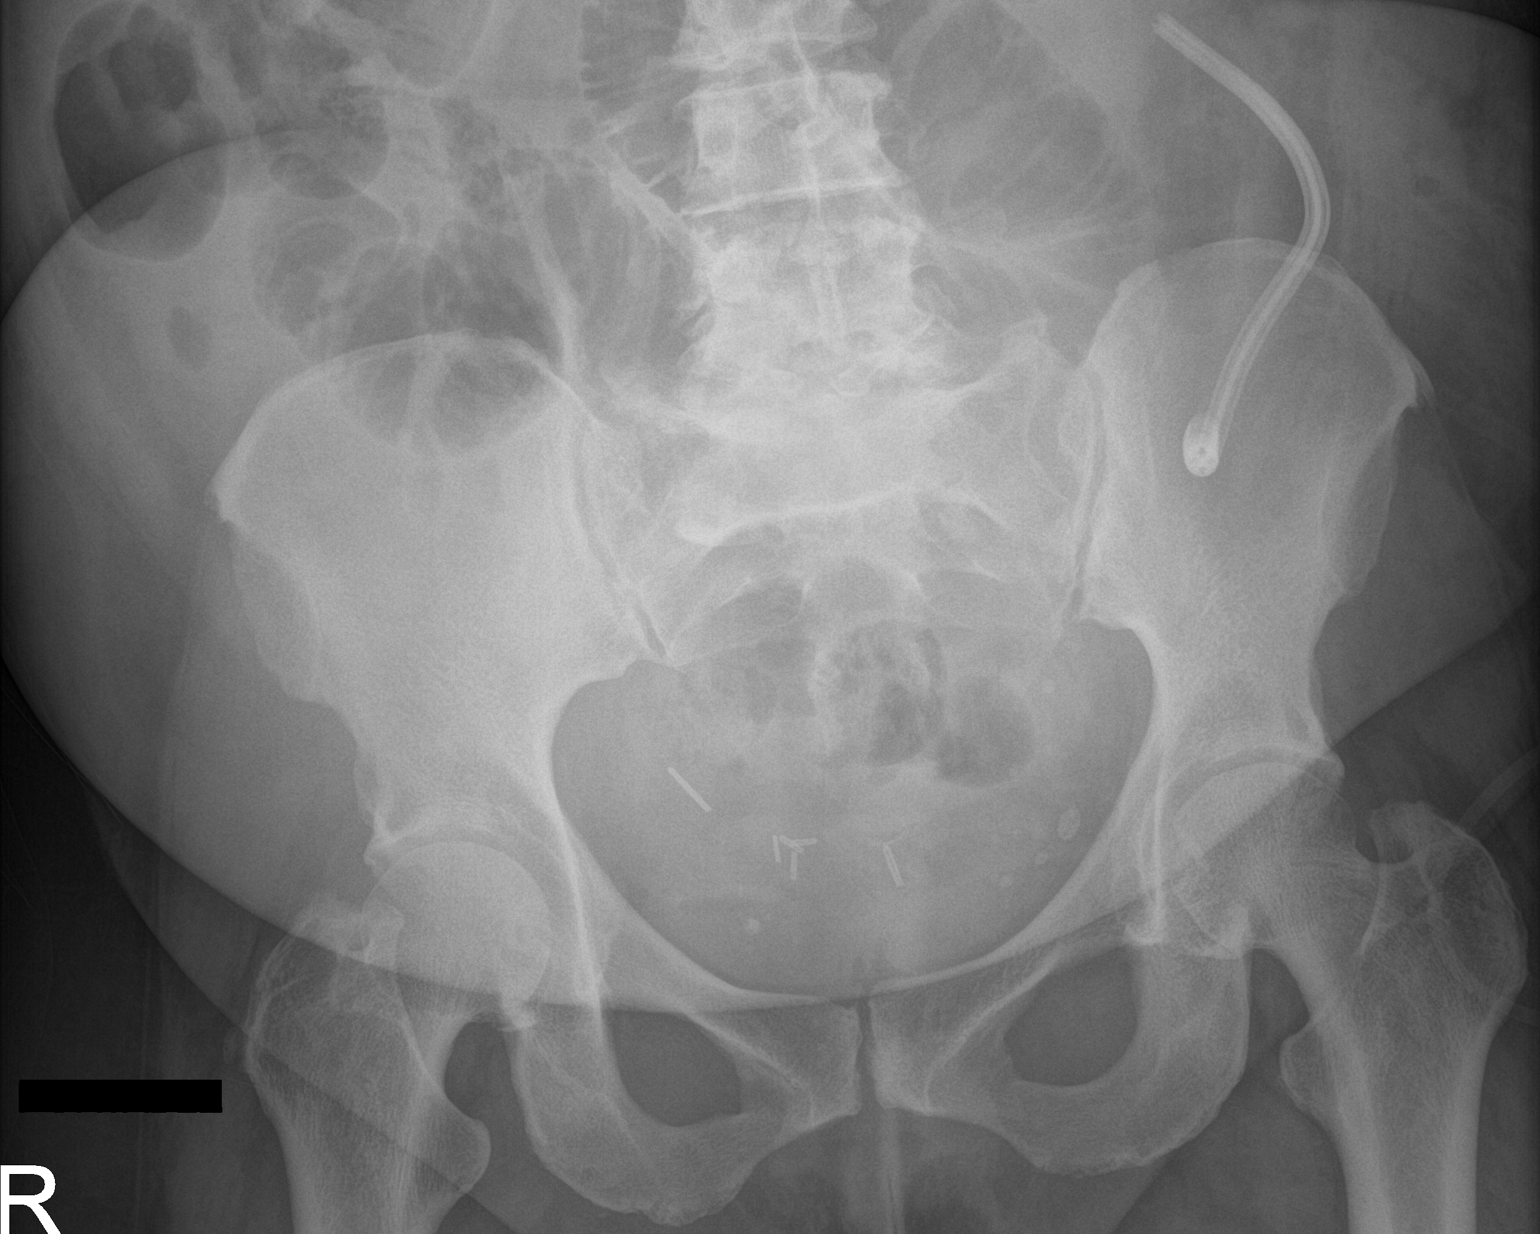

[abdomen kub (2 of 2)]
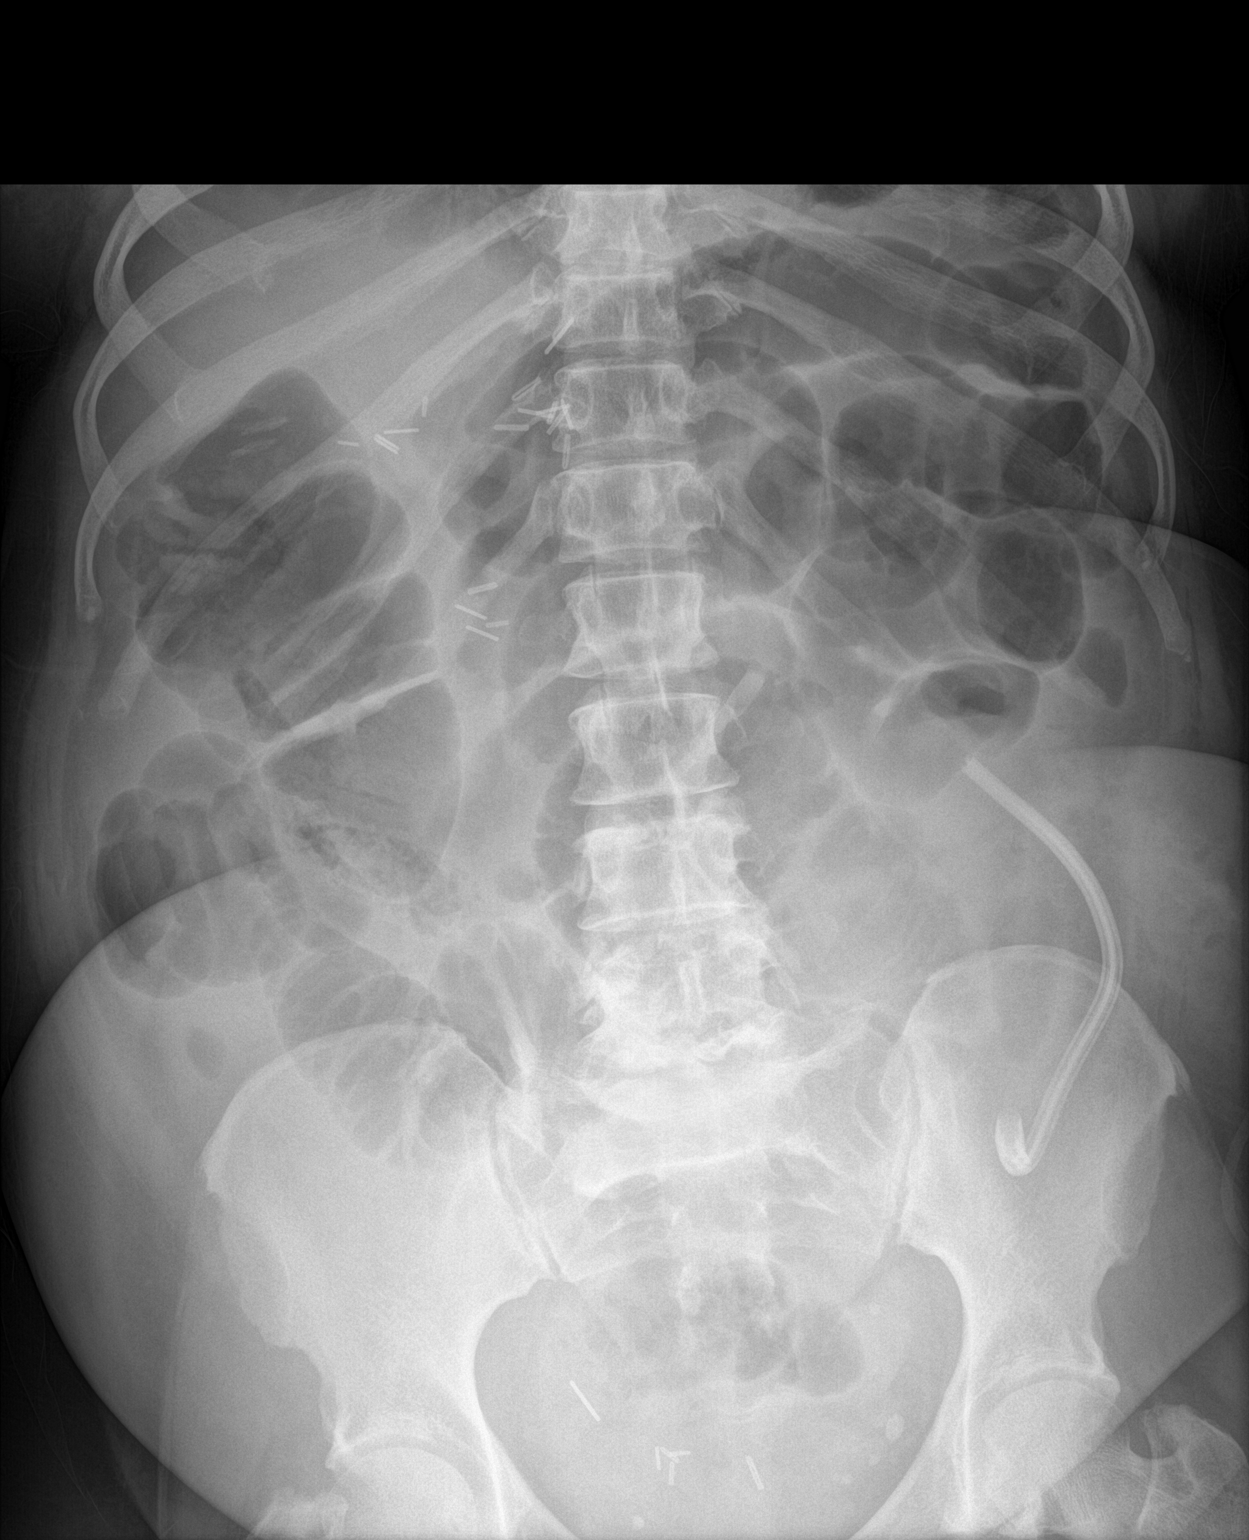

[2 of 2 positions shown; findings below may reference images not displayed]

FINDINGS: Drain LEFT mid abdomen.

Surgical clips RIGHT upper quadrant and low pelvis.

Air-filled loops of large and small bowel throughout upper and mid
abdomen consistent with postoperative ileus.

No bowel wall thickening.

Degenerative disc and facet disease changes at lower lumbar spine.
IMPRESSION: Postoperative ileus.

## 2019-12-03 MED ORDER — SODIUM CHLORIDE 0.9 % IV BOLUS
1000.0000 mL | Freq: Once | INTRAVENOUS | Status: AC
  Administered 2019-12-03: 1000 mL via INTRAVENOUS

## 2019-12-03 MED ORDER — BISACODYL 10 MG RE SUPP
10.0000 mg | Freq: Once | RECTAL | Status: AC
  Administered 2019-12-03: 10 mg via RECTAL
  Filled 2019-12-03: qty 1

## 2019-12-03 MED ORDER — METOPROLOL SUCCINATE ER 100 MG PO TB24
100.0000 mg | ORAL_TABLET | Freq: Every day | ORAL | Status: DC
  Administered 2019-12-03 – 2019-12-04 (×2): 100 mg via ORAL
  Filled 2019-12-03 (×2): qty 1

## 2019-12-03 MED ORDER — ENOXAPARIN SODIUM 40 MG/0.4ML ~~LOC~~ SOLN
40.0000 mg | SUBCUTANEOUS | Status: DC
  Administered 2019-12-03 – 2019-12-05 (×3): 40 mg via SUBCUTANEOUS
  Filled 2019-12-03 (×3): qty 0.4

## 2019-12-03 MED ORDER — POTASSIUM CHLORIDE 10 MEQ/100ML IV SOLN
10.0000 meq | INTRAVENOUS | Status: AC
  Administered 2019-12-03 (×3): 10 meq via INTRAVENOUS
  Filled 2019-12-03 (×3): qty 100

## 2019-12-03 MED ORDER — DEXTROSE-NACL 5-0.45 % IV SOLN: INTRAVENOUS | Status: DC

## 2019-12-03 NOTE — Progress Notes (Signed)
Encouraged patient to ambulate in hall, but patient is having ongoing nausea and vomiting at this time. Patient requested to hold off on ambulation until she felt better. Educated patient the importance of ambulation post-operatively and patient verbalized understanding. Will continue to monitor closely.

## 2019-12-03 NOTE — Progress Notes (Signed)
Pt having minimal abdominal pain. No emesis since 0600. Will continue to monitor pt.

## 2019-12-03 NOTE — Progress Notes (Signed)
3 Days Post-Op Subjective: Patient developed nausea with multiple episodes of emesis yesterday that has persisted.  No fever or chills.  Not tolerating PO.  Objective: Vital signs in last 24 hours: Temp:  [98.2 F (36.8 C)-99.2 F (37.3 C)] 98.6 F (37 C) (06/06 0517) Pulse Rate:  [86-109] 109 (06/06 0517) Resp:  [14-20] 20 (06/06 0517) BP: (141-149)/(86-91) 147/91 (06/06 0517) SpO2:  [94 %-97 %] 95 % (06/06 0517)  Intake/Output from previous day: 06/05 0701 - 06/06 0700 In: 4702.3 [P.O.:60; I.V.:4642.3] Out: 555 [Drains:555]  Physical Exam:  General: Alert and oriented CV: RRR Lungs: Clear Abdomen: Soft, ND, NT Incisions: c/d/i, JP drain removed today Ext: NT, No erythema  Lab Results: Recent Labs    11/30/19 1502 12/01/19 0451 12/02/19 0534  HGB 10.8* 11.8* 13.2  HCT 31.8* 34.3* 38.3   BMET Recent Labs    12/02/19 0534 12/03/19 0655  NA 135 140  K 3.2* 3.3*  CL 100 103  CO2 26 23  GLUCOSE 129* 151*  BUN 8 16  CREATININE 0.84 0.88  CALCIUM 9.2 8.8*     Studies/Results: No results found.  Assessment/Plan: 64 yo woman with left renal mass POD3 s/p left open partial nephrectomy.  Creatinine and Hgb stable.  Postop nausea/vomiting consistent with ileus.  -back off diet to sips as tolerated -ambulate, continue SCDs while in bed, IS, add lovenox for DVT ppx -pain meds PRN -foley has been removed -JP fluid Cr consistent with serum, removed today -ileus: KUB shows ileus, replete K, encourage OOB as much as possible   LOS: 3 days   Avinash Maltos D Nilson Tabora 12/03/2019, 8:29 AM

## 2019-12-03 NOTE — Progress Notes (Signed)
Notified Dr. Claudia Desanctis of patient's ongoing nausea and vomiting. Patient had 2 episodes of emesis overnight despite antiemetics given. Patient's abdomen is distended, but soft. New orders placed. Will continue to monitor closely.

## 2019-12-03 NOTE — Progress Notes (Signed)
Pt c/o of chest pain that comes and goes. MD was notified. EKG performed and troponin's ordered. Will continue to closely monitor pt.

## 2019-12-03 NOTE — Plan of Care (Signed)
  Problem: Urinary Elimination: Goal: Ability to avoid or minimize complications of infection will improve Outcome: Progressing Goal: Ability to achieve and maintain urine output will improve Outcome: Progressing   Problem: Bowel/Gastric: Goal: Gastrointestinal status for postoperative course will improve Outcome: Not Progressing  Patient with continued nausea and vomiting. Bowel sounds are hypoactive and patient has not passed gas or moved bowels.

## 2019-12-03 NOTE — Progress Notes (Signed)
   12/03/19 1411  Assess: MEWS Score  Temp 99 F (37.2 C)  BP (!) 132/93  Pulse Rate (!) 117  Resp (!) 24  SpO2 97 %  O2 Device Room Air  Assess: MEWS Score  MEWS Temp 0  MEWS Systolic 0  MEWS Pulse 2  MEWS RR 1  MEWS LOC 0  MEWS Score 3  MEWS Score Color Yellow  Assess: if the MEWS score is Yellow or Red  Were vital signs taken at a resting state? Yes  Focused Assessment Documented focused assessment  Early Detection of Sepsis Score *See Row Information* Medium  MEWS guidelines implemented *See Row Information* Yes  Treat  MEWS Interventions Escalated (See documentation below) (md made aware; pt has ileus)  Take Vital Signs  Increase Vital Sign Frequency  Yellow: Q 2hr X 2 then Q 4hr X 2, if remains yellow, continue Q 4hrs  Escalate  MEWS: Escalate Yellow: discuss with charge nurse/RN and consider discussing with provider and RRT  Notify: Charge Nurse/RN  Name of Charge Nurse/RN Notified Randa Lynn, RN  Date Charge Nurse/RN Notified 12/03/19  Time Charge Nurse/RN Notified 1433  Notify: Provider  Provider Name/Title Jacalyn Lefevre, MD  Date Provider Notified 12/03/19  Time Provider Notified 1433  Notification Type Page  Notification Reason Change in status  Response No new orders  Date of Provider Response 12/03/19  Time of Provider Response [id number]   Pt change in status. Yellow MEWS. HR and RR elevated. MD was notified of change in status. 1L bolus given. Pt placed on telemetry for tachycardic. Will continue to monitor pt.

## 2019-12-03 NOTE — Progress Notes (Signed)
Patient ID: Bonnie Torres, female   DOB: 1955/08/29, 64 y.o.   MRN: [id number]  3 Days Post-Op Subjective: I evaluated Bonnie Torres tonight due to ongoing tachycardia.  She developed an ileus over the weekend with nausea and vomiting. She denies flatus or BM.  Her drain was removed today (c/w serum).  She denies significant pain.  She did have mild chest pain earlier this evening which is now resolved.  She had an ECG that indicated sinus tachycardia.  She has not taken her beta blocker since 6/4 according to nursing.  She denies SOB and her oxygen saturation levels are 97% on RA.  Objective: Vital signs in last 24 hours: Temp:  [98.2 F (36.8 C)-99 F (37.2 C)] 98.5 F (36.9 C) (06/06 2023) Pulse Rate:  [109-127] 115 (06/06 2023) Resp:  [17-24] 17 (06/06 2023) BP: (132-161)/(91-95) 136/93 (06/06 2023) SpO2:  [95 %-98 %] 95 % (06/06 2023)  Intake/Output from previous day: 06/05 0701 - 06/06 0700 In: 4702.3 [P.O.:60; I.V.:4642.3] Out: 555 [Drains:555] Intake/Output this shift: No intake/output data recorded.  Physical Exam:  General: Alert and oriented CV: Regular, tachycardic Lungs: Clear Abdomen: Soft, mild distention, No BS Incisions: C/D/I Ext: NT, No erythema  Lab Results: Recent Labs    12/01/19 0451 12/02/19 0534 12/03/19 1943  HGB 11.8* 13.2 13.2  HCT 34.3* 38.3 37.5   CBC Latest Ref Rng & Units 12/03/2019 12/02/2019 12/01/2019  WBC 4.0 - 10.5 K/uL 12.5(H) - -  Hemoglobin 12.0 - 15.0 g/dL 13.2 13.2 11.8(L)  Hematocrit 36.0 - 46.0 % 37.5 38.3 34.3(L)  Platelets 150 - 400 K/uL 314 - -     BMET Recent Labs    12/02/19 0534 12/03/19 0655  NA 135 140  K 3.2* 3.3*  CL 100 103  CO2 26 23  GLUCOSE 129* 151*  BUN 8 16  CREATININE 0.84 0.88  CALCIUM 9.2 8.8*     Studies/Results: DG Abd 1 View  Result Date: 12/03/2019 CLINICAL DATA:  Postoperative ileus EXAM: ABDOMEN - 1 VIEW COMPARISON:  CT abdomen and pelvis 04/05/2019 FINDINGS: Drain LEFT mid abdomen.  Surgical clips RIGHT upper quadrant and low pelvis. Air-filled loops of large and small bowel throughout upper and mid abdomen consistent with postoperative ileus. No bowel wall thickening. Degenerative disc and facet disease changes at lower lumbar spine. IMPRESSION: Postoperative ileus. Electronically Signed   By: Lavonia Dana M.D.   On: 12/03/2019 10:09   DG CHEST PORT 1 VIEW  Result Date: 12/03/2019 CLINICAL DATA:  Right-sided pain status post recent partial right nephrectomy. EXAM: PORTABLE CHEST 1 VIEW COMPARISON:  October 20, 2019 FINDINGS: Decreased lung volumes are seen which is likely secondary to the degree of patient inspiration. Mild areas of atelectasis and/or infiltrate are seen within the bilateral lung bases. There is no evidence of a pleural effusion or pneumothorax. The heart size and mediastinal contours are within normal limits. Radiopaque surgical clips are seen along the medial aspect of the mid and upper right abdomen. The visualized skeletal structures are unremarkable. IMPRESSION: Mild bibasilar atelectasis and/or infiltrate. Electronically Signed   By: Virgina Norfolk M.D.   On: 12/03/2019 19:29    Assessment/Plan: Tachycardia: No concerning findings on evaluation to explain tachycardia.  May be due to the fact she has been off her beta blocker (metoprolol 100 mg) for the last 48 hrs.  Will restart with sips of water or administer IV beta blocker if needed.  Continue to monitor.   LOS: 3 days   Les  Bonnie Torres 12/03/2019, 9:21 PM

## 2019-12-03 NOTE — Progress Notes (Signed)
Pt HR sustaining in the 130's. BP elevated. Pt currently getting chest xray. MD was notified of elevated HR and EKG results. Orders placed for stat CBC with diff. Pt yellow MEWS currently. MD will come assess pt on unit. Will continue to closely monitor pt.

## 2019-12-04 LAB — CBC WITH DIFFERENTIAL/PLATELET
Abs Immature Granulocytes: 0.09 10*3/uL — ABNORMAL HIGH (ref 0.00–0.07)
Basophils Absolute: 0 10*3/uL (ref 0.0–0.1)
Basophils Relative: 0 %
Eosinophils Absolute: 0 10*3/uL (ref 0.0–0.5)
Eosinophils Relative: 0 %
HCT: 34.8 % — ABNORMAL LOW (ref 36.0–46.0)
Hemoglobin: 12.2 g/dL (ref 12.0–15.0)
Immature Granulocytes: 1 %
Lymphocytes Relative: 20 %
Lymphs Abs: 2.1 10*3/uL (ref 0.7–4.0)
MCH: 26.1 pg (ref 26.0–34.0)
MCHC: 35.1 g/dL (ref 30.0–36.0)
MCV: 74.4 fL — ABNORMAL LOW (ref 80.0–100.0)
Monocytes Absolute: 0.9 10*3/uL (ref 0.1–1.0)
Monocytes Relative: 9 %
Neutro Abs: 7 10*3/uL (ref 1.7–7.7)
Neutrophils Relative %: 70 %
Platelets: 319 10*3/uL (ref 150–400)
RBC: 4.68 MIL/uL (ref 3.87–5.11)
RDW: 13.9 % (ref 11.5–15.5)
WBC: 10.1 10*3/uL (ref 4.0–10.5)
nRBC: 0 % (ref 0.0–0.2)

## 2019-12-04 LAB — BASIC METABOLIC PANEL
Anion gap: 4 — ABNORMAL LOW (ref 5–15)
BUN: 14 mg/dL (ref 8–23)
CO2: 27 mmol/L (ref 22–32)
Calcium: 8.4 mg/dL — ABNORMAL LOW (ref 8.9–10.3)
Chloride: 107 mmol/L (ref 98–111)
Creatinine, Ser: 0.78 mg/dL (ref 0.44–1.00)
GFR calc Af Amer: >60 mL/min (ref >60)
GFR calc non Af Amer: >60 mL/min (ref >60)
Glucose, Bld: 136 mg/dL — ABNORMAL HIGH (ref 70–99)
Potassium: 3.3 mmol/L — ABNORMAL LOW (ref 3.5–5.1)
Sodium: 138 mmol/L (ref 135–145)

## 2019-12-04 LAB — GLUCOSE, CAPILLARY
Glucose-Capillary: 115 mg/dL — ABNORMAL HIGH (ref 70–99)
Glucose-Capillary: 116 mg/dL — ABNORMAL HIGH (ref 70–99)
Glucose-Capillary: 118 mg/dL — ABNORMAL HIGH (ref 70–99)
Glucose-Capillary: 118 mg/dL — ABNORMAL HIGH (ref 70–99)
Glucose-Capillary: 128 mg/dL — ABNORMAL HIGH (ref 70–99)
Glucose-Capillary: 133 mg/dL — ABNORMAL HIGH (ref 70–99)
Glucose-Capillary: 142 mg/dL — ABNORMAL HIGH (ref 70–99)

## 2019-12-04 MED ORDER — POTASSIUM CHLORIDE CRYS ER 20 MEQ PO TBCR
40.0000 meq | EXTENDED_RELEASE_TABLET | Freq: Once | ORAL | Status: AC
  Administered 2019-12-04: 40 meq via ORAL
  Filled 2019-12-04: qty 2

## 2019-12-04 MED ORDER — METOCLOPRAMIDE HCL 5 MG/ML IJ SOLN
5.0000 mg | Freq: Four times a day (QID) | INTRAMUSCULAR | Status: DC
  Administered 2019-12-04 – 2019-12-05 (×6): 5 mg via INTRAVENOUS
  Filled 2019-12-04 (×5): qty 2

## 2019-12-04 NOTE — Progress Notes (Addendum)
After reviewing pt's med administration it was noted that pt did not receive her am Amlodipine, Microzide and Metoprolol on 6/5 and 6/6 D/T N/V. Dr. Alinda Money notified.

## 2019-12-04 NOTE — Progress Notes (Signed)
Patient ID: Bonnie Torres, female   DOB: Mar 19, 1956, 64 y.o.   MRN: [id number]  4 Days Post-Op Subjective: Pt doing well this morning.  No more chest pain.  Some small amount of flatus. Feeling better overall.  Pain controlled. No more nausea.  Tachycardia resolved after restarting beta blocker.  Objective: Vital signs in last 24 hours: Temp:  [97.8 F (36.6 C)-99 F (37.2 C)] 97.8 F (36.6 C) (06/07 0548) Pulse Rate:  [93-127] 93 (06/07 0548) Resp:  [17-24] 17 (06/07 0548) BP: (132-161)/(87-95) 145/87 (06/07 0548) SpO2:  [92 %-98 %] 92 % (06/07 0548)  Intake/Output from previous day: 06/06 0701 - 06/07 0700 In: 5042.9 [P.O.:1400; I.V.:2342.9; IV Piggyback:1300] Out: 1941 [Urine:1000; Drains:65] Intake/Output this shift: No intake/output data recorded.  Physical Exam:  General: Alert and oriented CV: RRR Lungs: Clear Abdomen: Soft, ND, minimal BS Incisions: C/D/I Ext: NT, No erythema  Lab Results: Recent Labs    12/02/19 0534 12/03/19 1943 12/04/19 0531  HGB 13.2 13.2 12.2  HCT 38.3 37.5 34.8*   CBC Latest Ref Rng & Units 12/04/2019 12/03/2019 12/02/2019  WBC 4.0 - 10.5 K/uL 10.1 12.5(H) -  Hemoglobin 12.0 - 15.0 g/dL 12.2 13.2 13.2  Hematocrit 36.0 - 46.0 % 34.8(L) 37.5 38.3  Platelets 150 - 400 K/uL 319 314 -     BMET Recent Labs    12/03/19 0655 12/04/19 0531  NA 140 138  K 3.3* 3.3*  CL 103 107  CO2 23 27  GLUCOSE 151* 136*  BUN 16 14  CREATININE 0.88 0.78  CALCIUM 8.8* 8.4*     Studies/Results: DG Abd 1 View  Result Date: 12/03/2019 CLINICAL DATA:  Postoperative ileus EXAM: ABDOMEN - 1 VIEW COMPARISON:  CT abdomen and pelvis 04/05/2019 FINDINGS: Drain LEFT mid abdomen. Surgical clips RIGHT upper quadrant and low pelvis. Air-filled loops of large and small bowel throughout upper and mid abdomen consistent with postoperative ileus. No bowel wall thickening. Degenerative disc and facet disease changes at lower lumbar spine. IMPRESSION: Postoperative  ileus. Electronically Signed   By: Lavonia Dana M.D.   On: 12/03/2019 10:09   DG CHEST PORT 1 VIEW  Result Date: 12/03/2019 CLINICAL DATA:  Right-sided pain status post recent partial right nephrectomy. EXAM: PORTABLE CHEST 1 VIEW COMPARISON:  October 20, 2019 FINDINGS: Decreased lung volumes are seen which is likely secondary to the degree of patient inspiration. Mild areas of atelectasis and/or infiltrate are seen within the bilateral lung bases. There is no evidence of a pleural effusion or pneumothorax. The heart size and mediastinal contours are within normal limits. Radiopaque surgical clips are seen along the medial aspect of the mid and upper right abdomen. The visualized skeletal structures are unremarkable. IMPRESSION: Mild bibasilar atelectasis and/or infiltrate. Electronically Signed   By: Virgina Norfolk M.D.   On: 12/03/2019 19:29    Assessment/Plan: POD # 4 s/p left open partial nephrectomy - Ambulate, IS, DVT prophylaxis (Lovenox) - Will begin advancing diet again today - Continue IVF until sure tolerating po intake - Tachycardia resolved after restarting beta blocker - Begin Reglan and replace potassium    LOS: 4 days   [name] 12/04/2019, 7:23 AM

## 2019-12-04 NOTE — Care Management Important Message (Signed)
Important Message  Patient Details  Name: Bonnie Torres MRN: [id number] Date of Birth: [birth year]   Medicare Important Message Given:     Document was given to Evette Cristal  to give to the patient.   Crista Luria 12/04/2019, 10:45 AM

## 2019-12-05 LAB — BASIC METABOLIC PANEL
Anion gap: 12 (ref 5–15)
Anion gap: 9 (ref 5–15)
BUN: 11 mg/dL (ref 8–23)
BUN: 11 mg/dL (ref 8–23)
CO2: 23 mmol/L (ref 22–32)
CO2: 27 mmol/L (ref 22–32)
Calcium: 8.7 mg/dL — ABNORMAL LOW (ref 8.9–10.3)
Calcium: 8.5 mg/dL — ABNORMAL LOW (ref 8.9–10.3)
Chloride: 100 mmol/L (ref 98–111)
Chloride: 99 mmol/L (ref 98–111)
Creatinine, Ser: 0.67 mg/dL (ref 0.44–1.00)
Creatinine, Ser: 0.73 mg/dL (ref 0.44–1.00)
GFR calc Af Amer: >60 mL/min (ref >60)
GFR calc Af Amer: >60 mL/min (ref >60)
GFR calc non Af Amer: >60 mL/min (ref >60)
GFR calc non Af Amer: >60 mL/min (ref >60)
Glucose, Bld: 123 mg/dL — ABNORMAL HIGH (ref 70–99)
Glucose, Bld: 116 mg/dL — ABNORMAL HIGH (ref 70–99)
Potassium: 2.8 mmol/L — ABNORMAL LOW (ref 3.5–5.1)
Potassium: 3.1 mmol/L — ABNORMAL LOW (ref 3.5–5.1)
Sodium: 135 mmol/L (ref 135–145)
Sodium: 135 mmol/L (ref 135–145)

## 2019-12-05 LAB — GLUCOSE, CAPILLARY
Glucose-Capillary: 103 mg/dL — ABNORMAL HIGH (ref 70–99)
Glucose-Capillary: 104 mg/dL — ABNORMAL HIGH (ref 70–99)
Glucose-Capillary: 121 mg/dL — ABNORMAL HIGH (ref 70–99)

## 2019-12-05 LAB — HEMOGLOBIN AND HEMATOCRIT, BLOOD
HCT: 37.9 % (ref 36.0–46.0)
Hemoglobin: 13.4 g/dL (ref 12.0–15.0)

## 2019-12-05 MED ORDER — POTASSIUM CHLORIDE CRYS ER 20 MEQ PO TBCR
40.0000 meq | EXTENDED_RELEASE_TABLET | Freq: Once | ORAL | Status: AC
  Administered 2019-12-05: 40 meq via ORAL
  Filled 2019-12-05: qty 2

## 2019-12-05 MED ORDER — BISACODYL 10 MG RE SUPP
10.0000 mg | Freq: Once | RECTAL | Status: AC
  Administered 2019-12-05: 10 mg via RECTAL
  Filled 2019-12-05: qty 1

## 2019-12-05 NOTE — Discharge Summary (Signed)
Date of admission: 11/30/2019  Date of discharge: 12/05/2019  Admission diagnosis: Left renal neoplasm  Discharge diagnosis: Renal cell carcinoma  Secondary diagnoses: Hypertension, diabetes  History and Physical: For full details, please see admission history and physical. Briefly, Bonnie Torres is a 64 y.o. year old patient with a left renal neoplasm concerning for malignancy.  She has a history of renal cell carcinoma of the right kidney s/p partial nephrectomy a few years ago.   Hospital Course: She underwent a left open partial nephrectomy (due to prior surgical history) on 11/30/19.  This was uncomplicated and she remained on bedrest overnight.  She was hemodynamically stable and began ambulating and progressing her diet on POD#1.  She did develop a mild ileus over the next couple of days.  She also developed sinus tachycardia and it was noted that her beta blocker had been held when she was NPO during her ileus.  Her tachycardia resolved when metoprolol was restarted and other possible causes were ruled out.  She was able to resume a regular diet, had return of bowel function, and her pain was well controlled with oral medication.  Her drain was found to be serum and was removed.  Laboratory values:  Recent Labs    12/03/19 1943 12/04/19 0531 12/05/19 0554  HGB 13.2 12.2 13.4  HCT 37.5 34.8* 37.9   Recent Labs    12/05/19 0554 12/05/19 1152  CREATININE 0.67 0.73    Disposition: Home  Discharge instruction: The patient was instructed to be ambulatory but told to refrain from heavy lifting, strenuous activity, or driving.   Discharge medications:  Allergies as of 12/05/2019   No Known Allergies     Medication List    STOP taking these medications   multivitamin with minerals Tabs tablet     TAKE these medications   clindamycin 1 % external solution Commonly known as: CLEOCIN T Apply 1 application topically 2 (two) times daily.   glucose blood test strip Commonly  known as: Contour Next Test Use to test blood sugars twice daily   OneTouch Verio test strip Generic drug: glucose blood CHECK FASTING BLOOD SUGAR EVERY MORNING   HYDROcodone-acetaminophen 5-325 MG tablet Commonly known as: Norco Take 1-2 tablets by mouth every 6 (six) hours as needed for moderate pain.   metFORMIN 750 MG 24 hr tablet Commonly known as: GLUCOPHAGE-XR TAKE 1 TABLET(750 MG) BY MOUTH DAILY WITH BREAKFAST What changed:   how much to take  how to take this  when to take this  additional instructions   metoprolol succinate 100 MG 24 hr tablet Commonly known as: TOPROL-XL Take 1 tablet (100 mg total) by mouth daily.   Microlet Lancets Misc Use to check blood sugars twice daily   Olmesartan-amLODIPine-HCTZ 20-5-12.5 MG Tabs Take 1 tablet by mouth daily.   OneTouch Verio Flex System w/Device Kit Check fasting blood sugar every morning. DM ICD10 E11.9   potassium chloride SA 20 MEQ tablet Commonly known as: KLOR-CON TAKE 1 TABLET BY MOUTH DAILY What changed:   how much to take  how to take this  when to take this  additional instructions   pravastatin 40 MG tablet Commonly known as: PRAVACHOL TAKE 1 TABLET BY MOUTH DAILY. What changed:   how much to take  how to take this  when to take this  additional instructions   tretinoin 0.05 % cream Commonly known as: RETIN-A Apply 1 application topically at bedtime.   zolpidem 10 MG tablet Commonly known as: AMBIEN  TAKE 1 TABLET(10 MG) BY MOUTH AT BEDTIME AS NEEDED FOR SLEEP What changed: See the new instructions.       Followup:  Follow-up Information    Raynelle Bring, MD On 12/26/2019.   Specialty: Urology Why: at 9:00 Contact information: [address] [place] [id number] [phone]

## 2019-12-05 NOTE — Care Management Important Message (Signed)
Important Message  Patient Details IM Letter given to Evette Cristal SW Case Manager to present to the Patient Name: ASCENCION STEGNER MRN: [id number] Date of Birth: 1955-07-12   Medicare Important Message Given:  Yes     Kerin Salen 12/05/2019, 1:14 PM

## 2019-12-05 NOTE — Progress Notes (Addendum)
Patient ID: Bonnie Torres, female   DOB: 03-02-1956, 64 y.o.   MRN: [id number]  5 Days Post-Op Subjective: Pt did better yesterday and was able to tolerate clears after beginning to pass flatus.  She did have a BM yesterday.  She did vomit once last night but was able to eat solid food after that.  Denies nausea this morning. No flatus or BM overnight.  Objective: Vital signs in last 24 hours: Temp:  [97.9 F (36.6 C)-99.1 F (37.3 C)] 98 F (36.7 C) (06/08 0518) Pulse Rate:  [80-102] 102 (06/08 0518) Resp:  [17-18] 18 (06/08 0518) BP: (137-159)/(87-96) 150/96 (06/08 0518) SpO2:  [95 %-98 %] 97 % (06/08 0518)  Intake/Output from previous day: 06/07 0701 - 06/08 0700 In: 390 [P.O.:390] Out: 500 [Urine:500] Intake/Output this shift: No intake/output data recorded.  Physical Exam:  General: Alert and oriented CV: RRR Lungs: Clear Abdomen: Soft, ND, minimal BS Incisions: C/D/I Ext: NT, No erythema  Lab Results: Recent Labs    12/03/19 1943 12/04/19 0531 12/05/19 0554  HGB 13.2 12.2 13.4  HCT 37.5 34.8* 37.9   BMET Recent Labs    12/04/19 0531 12/05/19 0554  NA 138 135  K 3.3* 2.8*  CL 107 100  CO2 27 23  GLUCOSE 136* 123*  BUN 14 11  CREATININE 0.78 0.67  CALCIUM 8.4* 8.7*     Studies/Results:   Assessment/Plan: POD # 5 s/p left open partial nephrectomy 1) RCC: Pathology discussed.  Prognosis is good.  Will need outpatient surveillance. 2) Post - op care: Continue po pain medication, ambulation, DVT prophylaxis 3) Ileus: Potassium low again.  Will replace and resume normal potassium supplement that she takes.  Continue to progress diet today.  Suppository.  Continue Reglan.  Appears to be resolving. 4) Tachycardia: Resolved after restarting normal beta blocker. 5) Disp: Will re-evaluate later today for possible discharge.   LOS: 5 days   Bonnie Torres 12/05/2019, 7:28 AM

## 2019-12-26 DIAGNOSIS — C642 Malignant neoplasm of left kidney, except renal pelvis: Secondary | ICD-10-CM | POA: Diagnosis not present

## 2020-01-02 ENCOUNTER — Other Ambulatory Visit: Payer: Self-pay | Admitting: Neurology

## 2020-01-02 ENCOUNTER — Encounter: Payer: Self-pay | Admitting: Sports Medicine

## 2020-01-02 ENCOUNTER — Ambulatory Visit (INDEPENDENT_AMBULATORY_CARE_PROVIDER_SITE_OTHER): Payer: Medicare Other | Admitting: Sports Medicine

## 2020-01-02 DIAGNOSIS — F5101 Primary insomnia: Secondary | ICD-10-CM

## 2020-01-02 DIAGNOSIS — G5622 Lesion of ulnar nerve, left upper limb: Secondary | ICD-10-CM

## 2020-01-02 MED ORDER — ZOLPIDEM TARTRATE 10 MG PO TABS: 10.0000 mg | ORAL_TABLET | Freq: Every day | ORAL | 0 refills | Status: DC

## 2020-01-02 NOTE — Telephone Encounter (Signed)
Last appt with you 06/20/2019 (has seen Dr. Darene Lamer)  Last filled 07/03/2019 #30 with 5 refills, pended with note that patient needs appt

## 2020-01-02 NOTE — Progress Notes (Signed)
    Procedures performed today:    None.  Independent interpretation of notes and tests performed by another provider:   None.  Brief History, Exam, Impression, and Recommendations:    Cubital tunnel syndrome, left This is a pleasant 64 year old female, we have been treating her for left cubital tunnel syndrome, she improved to some degree with an elbow sleeve, and ulnar nerve mobilization rehab exercises. Unfortunately her last 6 weeks was complicated by hospitalization for renal cell carcinoma, attempted robotic partial nephrectomy that needed to be converted to an open nephrectomy, she spent some time in the hospital, and was unable to continue her cubital tunnel treatment. She does desire to continue her home rehab exercises, conservative treatment and will return to see me in a month for consideration of ulnar nerve hydrodissection if no better.    ___________________________________________ Gwen Her. Dianah Field, M.D., ABFM., CAQSM. Primary Care and [place] Instructor of [place] [facility]

## 2020-01-02 NOTE — Assessment & Plan Note (Signed)
This is a pleasant 64 year old female, we have been treating her for left cubital tunnel syndrome, she improved to some degree with an elbow sleeve, and ulnar nerve mobilization rehab exercises. Unfortunately her last 6 weeks was complicated by hospitalization for renal cell carcinoma, attempted robotic partial nephrectomy that needed to be converted to an open nephrectomy, she spent some time in the hospital, and was unable to continue her cubital tunnel treatment. She does desire to continue her home rehab exercises, conservative treatment and will return to see me in a month for consideration of ulnar nerve hydrodissection if no better.

## 2020-01-04 ENCOUNTER — Other Ambulatory Visit: Payer: Self-pay | Admitting: Neurology

## 2020-01-04 DIAGNOSIS — I10 Essential (primary) hypertension: Secondary | ICD-10-CM

## 2020-01-04 MED ORDER — OLMESARTAN-AMLODIPINE-HCTZ 20-5-12.5 MG PO TABS: 1.0000 | ORAL_TABLET | Freq: Every day | ORAL | 0 refills | Status: DC

## 2020-01-29 ENCOUNTER — Ambulatory Visit (INDEPENDENT_AMBULATORY_CARE_PROVIDER_SITE_OTHER): Payer: Medicare Other | Admitting: Physician Assistant

## 2020-01-29 ENCOUNTER — Other Ambulatory Visit: Payer: Self-pay

## 2020-01-29 ENCOUNTER — Encounter: Payer: Self-pay | Admitting: Physician Assistant

## 2020-01-29 VITALS — BP 130/80 | HR 86 | Temp 98.6°F | Ht 59.0 in | Wt 161.6 lb

## 2020-01-29 DIAGNOSIS — F5101 Primary insomnia: Secondary | ICD-10-CM

## 2020-01-29 DIAGNOSIS — E876 Hypokalemia: Secondary | ICD-10-CM | POA: Diagnosis not present

## 2020-01-29 DIAGNOSIS — C642 Malignant neoplasm of left kidney, except renal pelvis: Secondary | ICD-10-CM

## 2020-01-29 DIAGNOSIS — I10 Essential (primary) hypertension: Secondary | ICD-10-CM | POA: Diagnosis not present

## 2020-01-29 DIAGNOSIS — E782 Mixed hyperlipidemia: Secondary | ICD-10-CM | POA: Diagnosis not present

## 2020-01-29 DIAGNOSIS — M722 Plantar fascial fibromatosis: Secondary | ICD-10-CM

## 2020-01-29 DIAGNOSIS — Z79899 Other long term (current) drug therapy: Secondary | ICD-10-CM

## 2020-01-29 DIAGNOSIS — Z905 Acquired absence of kidney: Secondary | ICD-10-CM

## 2020-01-29 DIAGNOSIS — E119 Type 2 diabetes mellitus without complications: Secondary | ICD-10-CM

## 2020-01-29 MED ORDER — METFORMIN HCL ER 750 MG PO TB24: 750.0000 mg | ORAL_TABLET | Freq: Every day | ORAL | 1 refills | Status: DC

## 2020-01-29 MED ORDER — DICLOFENAC SODIUM 1 % EX GEL: 4.0000 g | Freq: Four times a day (QID) | CUTANEOUS | 2 refills | Status: DC

## 2020-01-29 MED ORDER — METOPROLOL SUCCINATE ER 100 MG PO TB24: 100.0000 mg | ORAL_TABLET | Freq: Every day | ORAL | 1 refills | Status: DC

## 2020-01-29 MED ORDER — ZOLPIDEM TARTRATE 10 MG PO TABS: 10.0000 mg | ORAL_TABLET | Freq: Every day | ORAL | 1 refills | Status: DC

## 2020-01-29 MED ORDER — PRAVASTATIN SODIUM 40 MG PO TABS: 40.0000 mg | ORAL_TABLET | Freq: Every day | ORAL | 1 refills | Status: DC

## 2020-01-29 MED ORDER — POTASSIUM CHLORIDE CRYS ER 20 MEQ PO TBCR: EXTENDED_RELEASE_TABLET | ORAL | 1 refills | Status: DC

## 2020-01-29 NOTE — Progress Notes (Signed)
Subjective:    Patient ID: Bonnie Torres, female    DOB: February 10, 1956, 64 y.o.   MRN: [id number]  HPI  Pt is a 64 yo female with HTN, bilateral kidney neoplasm, T2DM, hypokalemia, HLD who presents to the clinic for 6 month follow up.   Patient continues to take Metformin for diabetes.  She is not checking her blood sugars.  She had it last checked in the hospital with a A1c of 5.6.  She denies any open sores or wounds.  She denies any hypoglycemic events.  Patient had bilateral right and left neoplasms.  A few years ago she had a partial right nephrectomy and on 12/27/2019 she had a left partial nephrectomy.  She is doing well.  She does not have a follow-up with nephrology until January.  Her kidney function was tested after surgery and was perfect.  She is not starting any chemotherapy or radiation.  Patient is having some bilateral heel pain.  Worse when she wears flip-flops.  Better when she wears good supportive shoes.  Denies any injury to feet.  She has not done anything to make better or worse.  This is been going on for a few months.  Seeing dermatology. Wants potassium checked after starting spironolactone. Not started yet.   Doing well with mood and sleep. No concerns.   .. Active Ambulatory Problems    Diagnosis Date Noted  . Insomnia 08/31/2014  . Essential hypertension, benign 08/31/2014  . Hyperlipidemia 09/28/2014  . Bilateral low back pain without sciatica 04/17/2015  . Primary gout 10/16/2015  . Breast calcification, right 11/13/2015  . Elevated testosterone level in female 03/25/2016  . Mass of kidney 04/05/2016  . Uterine mass 04/27/2016  . Lymphangioma 06/18/2016  . Hot flashes due to surgical menopause 08/19/2016  . Obesity (BMI 30.0-34.9) 09/16/2016  . Hypokalemia 04/02/2017  . Anterior cervical adenopathy 05/19/2017  . Trigger middle finger of left hand 12/01/2017  . Type 2 diabetes mellitus without complication, without long-term current use of insulin  ([place]) 12/03/2017  . Class 1 obesity due to excess calories without serious comorbidity with body mass index (BMI) of 33.0 to 33.9 in adult 12/03/2017  . Elevated serum creatinine 12/07/2017  . Irregular heart rate 04/06/2018  . Hypotension 04/06/2018  . Mid back pain on left side 07/29/2018  . Personal history of gout 01/25/2019  . Acute gout of right foot 01/25/2019  . Elevated uric acid in blood 01/25/2019  . Cubital tunnel syndrome, left 11/20/2019  . Neoplasm of left kidney 11/30/2019  . Bilateral plantar fasciitis 01/29/2020  . neoplasm left kidney 01/29/2020  . Hx of partial nephrectomy 01/29/2020   Resolved Ambulatory Problems    Diagnosis Date Noted  . Mass of omentum 04/05/2016  . Hirsutism 04/05/2016  . Mass of ovary 04/05/2016  . Renal cell carcinoma ([place]) 08/18/2016   Past Medical History:  Diagnosis Date  . Complication of anesthesia   . Gout   . Heart rate fast   . Hypertension   . PONV (postoperative nausea and vomiting)   . 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      Objective:   Physical Exam Vitals reviewed.  Constitutional:      Appearance: Normal appearance. She is obese.  HENT:     Head: Normocephalic.  Cardiovascular:     Rate and Rhythm: Normal rate and regular rhythm.     Pulses: Normal pulses.  Heart sounds: Normal heart sounds.  Pulmonary:     Effort: Pulmonary effort is normal.     Breath sounds: Normal breath sounds.  Musculoskeletal:     Comments: Bilateral heel pain to palpation.  NROM.  No swelling.   Neurological:     General: No focal deficit present.     Mental Status: She is alert and oriented to person, place, and time.  Psychiatric:        Mood and Affect: Mood normal.           Assessment & Plan:  Marland KitchenMarland KitchenFaith was seen today for follow-up.  Diagnoses and all orders for this visit:  Type 2 diabetes mellitus without complication, without  long-term current use of insulin (HCC) -     metFORMIN (GLUCOPHAGE-XR) 750 MG 24 hr tablet; Take 1 tablet (750 mg total) by mouth daily with breakfast.  neoplasm left kidney  Essential hypertension, benign -     metoprolol succinate (TOPROL-XL) 100 MG 24 hr tablet; Take 1 tablet (100 mg total) by mouth daily.  Hypokalemia -     potassium chloride SA (KLOR-CON) 20 MEQ tablet; TAKE 1 TABLET BY MOUTH DAILY -     COMPLETE METABOLIC PANEL WITH GFR  Mixed hyperlipidemia -     pravastatin (PRAVACHOL) 40 MG tablet; Take 1 tablet (40 mg total) by mouth at bedtime. -     Lipid Panel w/reflex Direct LDL  Bilateral plantar fasciitis -     diclofenac Sodium (VOLTAREN) 1 % GEL; Apply 4 g topically 4 (four) times daily. To affected joint.  Primary insomnia -     zolpidem (AMBIEN) 10 MG tablet; Take 1 tablet (10 mg total) by mouth at bedtime.  Medication management -     COMPLETE METABOLIC PANEL WITH GFR  Hx of partial nephrectomy   Last A1c was 5.6 in the hospital about a month ago.  No need to recheck today. Continue on same meds for diabetic control. On statin On ACE blood pressure to goal Needs eye exam.  .. Diabetic Foot Exam - Simple   Simple Foot Form Visual Inspection No deformities, no ulcerations, no other skin breakdown bilaterally: Yes Sensation Testing Intact to touch and monofilament testing bilaterally: Yes Pulse Check Posterior Tibialis and Dorsalis pulse intact bilaterally: Yes Comments     Per patient covid vaccine done at [facility] [place] will reconcile.   Need CMP checked after start of spironolactone. Discussed to have drawn about 3-4 weeks after starting.   Discussed bilateral plantar fasciitis.  Continue to wear good supportive shoes.  Consider inserts for more support.  Diclofenac given to rub over bilateral feet a few times a day.  Consider icing feet at night.  Gave stretches to start.  Follow-up with Dr. Dianah Field as needed.

## 2020-01-29 NOTE — Patient Instructions (Signed)
Plantar Fasciitis Rehab Ask your health care provider which exercises are safe for you. Do exercises exactly as told by your health care provider and adjust them as directed. It is normal to feel mild stretching, pulling, tightness, or discomfort as you do these exercises. Stop right away if you feel sudden pain or your pain gets worse. Do not begin these exercises until told by your health care provider. Stretching and range-of-motion exercises These exercises warm up your muscles and joints and improve the movement and flexibility of your foot. These exercises also help to relieve pain. Plantar fascia stretch  1. Sit with your left / right leg crossed over your opposite knee. 2. Hold your heel with one hand with that thumb near your arch. With your other hand, hold your toes and gently pull them back toward the top of your foot. You should feel a stretch on the bottom of your toes or your foot (plantar fascia) or both. 3. Hold this stretch for__________ seconds. 4. Slowly release your toes and return to the starting position. Repeat __________ times. Complete this exercise __________ times a day. Gastrocnemius stretch, standing This exercise is also called a calf (gastroc) stretch. It stretches the muscles in the back of the upper calf. 1. Stand with your hands against a wall. 2. Extend your left / right leg behind you, and bend your front knee slightly. 3. Keeping your heels on the floor and your back knee straight, shift your weight toward the wall. Do not arch your back. You should feel a gentle stretch in your upper left / right calf. 4. Hold this position for __________ seconds. Repeat __________ times. Complete this exercise __________ times a day. Soleus stretch, standing This exercise is also called a calf (soleus) stretch. It stretches the muscles in the back of the lower calf. 1. Stand with your hands against a wall. 2. Extend your left / right leg behind you, and bend your front  knee slightly. 3. Keeping your heels on the floor, bend your back knee and shift your weight slightly over your back leg. You should feel a gentle stretch deep in your lower calf. 4. Hold this position for __________ seconds. Repeat __________ times. Complete this exercise __________ times a day. Gastroc and soleus stretch, standing step This exercise stretches the muscles in the back of the lower leg. These muscles are in the upper calf (gastrocnemius) and the lower calf (soleus). 1. Stand with the ball of your left / right foot on a step. The ball of your foot is on the walking surface, right under your toes. 2. Keep your other foot firmly on the same step. 3. Hold on to the wall or a railing for balance. 4. Slowly lift your other foot, allowing your body weight to press your left / right heel down over the edge of the step. You should feel a stretch in your left / right calf. 5. Hold this position for __________ seconds. 6. Return both feet to the step. 7. Repeat this exercise with a slight bend in your left / right knee. Repeat __________ times with your left / right knee straight and __________ times with your left / right knee bent. Complete this exercise __________ times a day. Balance exercise This exercise builds your balance and strength control of your arch to help take pressure off your plantar fascia. Single leg stand If this exercise is too easy, you can try it with your eyes closed or while standing on a pillow. 1.   Without shoes, stand near a railing or in a doorway. You may hold on to the railing or door frame as needed. 2. Stand on your left / right foot. Keep your big toe down on the floor and try to keep your arch lifted. Do not let your foot roll inward. 3. Hold this position for __________ seconds. Repeat __________ times. Complete this exercise __________ times a day. This information is not intended to replace advice given to you by your health care provider. Make sure  you discuss any questions you have with your health care provider. Document Revised: 10/06/2018 Document Reviewed: 04/13/2018 Elsevier Patient Education  2020 [employer]  

## 2020-01-30 ENCOUNTER — Ambulatory Visit (INDEPENDENT_AMBULATORY_CARE_PROVIDER_SITE_OTHER): Payer: Medicare Other | Admitting: Sports Medicine

## 2020-01-30 DIAGNOSIS — G5622 Lesion of ulnar nerve, left upper limb: Secondary | ICD-10-CM | POA: Diagnosis not present

## 2020-01-30 NOTE — Progress Notes (Signed)
    Procedures performed today:    None.  Independent interpretation of notes and tests performed by another provider:   None.  Brief History, Exam, Impression, and Recommendations:    Cubital tunnel syndrome, left Bonnie Torres, she is a very pleasant 64 year old female, left cubital tunnel syndrome improved considerably with an elbow sleeve, as well as ulnar nerve mobilization rehabilitation exercises, she is recovering from open nephrectomy for renal cell carcinoma. She starts work again, and would like to see how things go. Certainly we could start gabapentin if she has worsening discomfort. Cubital tunnel ulnar nerve hydrodissection is the last resort.    ___________________________________________ Gwen Her. Dianah Field, M.D., ABFM., CAQSM. Primary Care and [place] Instructor of [employer] [facility]

## 2020-01-30 NOTE — Assessment & Plan Note (Signed)
Bonnie Torres returns, she is a very pleasant 64 year old female, left cubital tunnel syndrome improved considerably with an elbow sleeve, as well as ulnar nerve mobilization rehabilitation exercises, she is recovering from open nephrectomy for renal cell carcinoma. She starts work again, and would like to see how things go. Certainly we could start gabapentin if she has worsening discomfort. Cubital tunnel ulnar nerve hydrodissection is the last resort.

## 2020-02-01 ENCOUNTER — Other Ambulatory Visit: Payer: Self-pay | Admitting: Neurology

## 2020-02-01 DIAGNOSIS — I10 Essential (primary) hypertension: Secondary | ICD-10-CM

## 2020-02-01 MED ORDER — OLMESARTAN-AMLODIPINE-HCTZ 20-5-12.5 MG PO TABS: 1.0000 | ORAL_TABLET | Freq: Every day | ORAL | 1 refills | Status: DC

## 2020-02-02 ENCOUNTER — Other Ambulatory Visit: Payer: Self-pay | Admitting: Neurology

## 2020-02-02 DIAGNOSIS — E119 Type 2 diabetes mellitus without complications: Secondary | ICD-10-CM

## 2020-02-02 MED ORDER — MICROLET LANCETS MISC: 5 refills | Status: DC

## 2020-02-02 MED ORDER — ONETOUCH VERIO VI STRP: 1.0000 | ORAL_STRIP | 1 refills | Status: DC | PRN

## 2020-02-02 NOTE — Telephone Encounter (Signed)
Patient called to get DM supplies sent to [facility]. Sent.

## 2020-02-03 ENCOUNTER — Other Ambulatory Visit: Payer: Self-pay | Admitting: Physician Assistant

## 2020-02-03 DIAGNOSIS — I10 Essential (primary) hypertension: Secondary | ICD-10-CM

## 2020-02-24 ENCOUNTER — Other Ambulatory Visit: Payer: Self-pay | Admitting: Physician Assistant

## 2020-02-24 DIAGNOSIS — E782 Mixed hyperlipidemia: Secondary | ICD-10-CM

## 2020-03-04 ENCOUNTER — Other Ambulatory Visit: Payer: Self-pay | Admitting: Physician Assistant

## 2020-03-04 DIAGNOSIS — E876 Hypokalemia: Secondary | ICD-10-CM

## 2020-03-06 DIAGNOSIS — L700 Acne vulgaris: Secondary | ICD-10-CM | POA: Diagnosis not present

## 2020-03-11 DIAGNOSIS — E782 Mixed hyperlipidemia: Secondary | ICD-10-CM | POA: Diagnosis not present

## 2020-03-11 DIAGNOSIS — E876 Hypokalemia: Secondary | ICD-10-CM | POA: Diagnosis not present

## 2020-03-11 DIAGNOSIS — Z79899 Other long term (current) drug therapy: Secondary | ICD-10-CM | POA: Diagnosis not present

## 2020-03-12 ENCOUNTER — Other Ambulatory Visit: Payer: Self-pay | Admitting: Neurology

## 2020-03-12 ENCOUNTER — Encounter: Payer: Self-pay | Admitting: Physician Assistant

## 2020-03-12 DIAGNOSIS — N1832 Chronic kidney disease, stage 3b: Secondary | ICD-10-CM | POA: Insufficient documentation

## 2020-03-12 DIAGNOSIS — N183 Chronic kidney disease, stage 3 unspecified: Secondary | ICD-10-CM | POA: Insufficient documentation

## 2020-03-12 LAB — COMPLETE METABOLIC PANEL WITH GFR
AG Ratio: 1.5 (calc) (ref 1.0–2.5)
ALT: 12 U/L (ref 6–29)
AST: 14 U/L (ref 10–35)
Albumin: 4.6 g/dL (ref 3.6–5.1)
Alkaline phosphatase (APISO): 72 U/L (ref 37–153)
BUN/Creatinine Ratio: 22 (calc) (ref 6–22)
BUN: 25 mg/dL (ref 7–25)
CO2: 26 mmol/L (ref 20–32)
Calcium: 10.2 mg/dL (ref 8.6–10.4)
Chloride: 102 mmol/L (ref 98–110)
Creat: 1.16 mg/dL — ABNORMAL HIGH (ref 0.50–0.99)
GFR, Est African American: 58 mL/min/{1.73_m2} — ABNORMAL LOW (ref >=60)
GFR, Est Non African American: 50 mL/min/{1.73_m2} — ABNORMAL LOW (ref >=60)
Globulin: 3.1 g/dL (calc) (ref 1.9–3.7)
Glucose, Bld: 88 mg/dL (ref 65–139)
Potassium: 4.7 mmol/L (ref 3.5–5.3)
Sodium: 136 mmol/L (ref 135–146)
Total Bilirubin: 0.5 mg/dL (ref 0.2–1.2)
Total Protein: 7.7 g/dL (ref 6.1–8.1)

## 2020-03-12 LAB — LIPID PANEL W/REFLEX DIRECT LDL
Cholesterol: 170 mg/dL (ref <200)
HDL: 48 mg/dL — ABNORMAL LOW (ref >=50)
LDL Cholesterol (Calc): 104 mg/dL (calc) — ABNORMAL HIGH
Non-HDL Cholesterol (Calc): 122 mg/dL (calc) (ref <130)
Total CHOL/HDL Ratio: 3.5 (calc) (ref <5.0)
Triglycerides: 90 mg/dL (ref <150)

## 2020-03-12 MED ORDER — ATORVASTATIN CALCIUM 40 MG PO TABS
40.0000 mg | ORAL_TABLET | Freq: Every day | ORAL | 3 refills | Status: DC
Start: 2020-03-12 — End: 2021-01-17

## 2020-03-12 NOTE — Progress Notes (Signed)
Call pt:  LDL is not to goal of under 70 at 104. I want to change your cholesterol medication to something a little more stronger. Are you ok with this? If so ok to send lipitor 40mg  daily #90 with 3 refills to replace pravachol.   Kidney function is down some with GFR of 50. Make sure not taking any anti-inflammatories. Confirm she is seeing nephrology regularly to monitor? If not we need to recheck in 3 months.

## 2020-03-12 NOTE — Progress Notes (Signed)
Ok to wait until appt in [place] with nephrology.

## 2020-03-28 DIAGNOSIS — Z7984 Long term (current) use of oral hypoglycemic drugs: Secondary | ICD-10-CM | POA: Diagnosis not present

## 2020-03-28 DIAGNOSIS — E119 Type 2 diabetes mellitus without complications: Secondary | ICD-10-CM | POA: Diagnosis not present

## 2020-03-28 LAB — HM DIABETES EYE EXAM

## 2020-04-01 DIAGNOSIS — H5213 Myopia, bilateral: Secondary | ICD-10-CM | POA: Diagnosis not present

## 2020-04-18 ENCOUNTER — Other Ambulatory Visit: Payer: Self-pay

## 2020-04-18 ENCOUNTER — Encounter: Payer: Self-pay | Admitting: Medical-Surgical

## 2020-04-18 ENCOUNTER — Ambulatory Visit (INDEPENDENT_AMBULATORY_CARE_PROVIDER_SITE_OTHER): Payer: Medicare Other | Admitting: Medical-Surgical

## 2020-04-18 VITALS — BP 94/60 | HR 71 | Temp 97.5°F | Ht 58.25 in | Wt 159.3 lb

## 2020-04-18 DIAGNOSIS — Z23 Encounter for immunization: Secondary | ICD-10-CM

## 2020-04-18 NOTE — Progress Notes (Signed)
Patient was scheduled too early to complete Medicare wellness visit. She will reschedule this after 04/24/2020. Was given her flu vaccine in office today.   Clearnce Sorrel, DNP, APRN, FNP-BC [facility]

## 2020-04-18 NOTE — Patient Instructions (Signed)

## 2020-04-26 ENCOUNTER — Other Ambulatory Visit: Payer: Self-pay

## 2020-04-26 DIAGNOSIS — I10 Essential (primary) hypertension: Secondary | ICD-10-CM

## 2020-04-26 MED ORDER — OLMESARTAN-AMLODIPINE-HCTZ 20-5-12.5 MG PO TABS: 1.0000 | ORAL_TABLET | Freq: Every day | ORAL | 1 refills | Status: DC

## 2020-04-29 ENCOUNTER — Ambulatory Visit: Payer: Medicare Other

## 2020-05-08 ENCOUNTER — Telehealth: Payer: Self-pay | Admitting: Neurology

## 2020-05-08 ENCOUNTER — Ambulatory Visit (INDEPENDENT_AMBULATORY_CARE_PROVIDER_SITE_OTHER): Payer: Medicare Other | Admitting: Physician Assistant

## 2020-05-08 VITALS — BP 102/65 | HR 76 | Ht 58.25 in | Wt 156.0 lb

## 2020-05-08 DIAGNOSIS — E119 Type 2 diabetes mellitus without complications: Secondary | ICD-10-CM

## 2020-05-08 DIAGNOSIS — Z1211 Encounter for screening for malignant neoplasm of colon: Secondary | ICD-10-CM

## 2020-05-08 DIAGNOSIS — Z0000 Encounter for general adult medical examination without abnormal findings: Secondary | ICD-10-CM

## 2020-05-08 DIAGNOSIS — H524 Presbyopia: Secondary | ICD-10-CM | POA: Diagnosis not present

## 2020-05-08 LAB — POCT GLYCOSYLATED HEMOGLOBIN (HGB A1C): Hemoglobin A1C: 6.1 % — AB (ref 4.0–5.6)

## 2020-05-08 NOTE — Telephone Encounter (Signed)
Cologuard order faxed to [phone] with confirmation received. They will contact the patient directly.   

## 2020-05-08 NOTE — Patient Instructions (Addendum)
Get 2nd shingles vaccine.  Suggest calcium/vitam D supplement.    Health Maintenance, Female Adopting a healthy lifestyle and getting preventive care are important in promoting health and wellness. Ask your health care provider about:  The right schedule for you to have regular tests and exams.  Things you can do on your own to prevent diseases and keep yourself healthy. What should I know about diet, weight, and exercise? Eat a healthy diet   Eat a diet that includes plenty of vegetables, fruits, low-fat dairy products, and lean protein.  Do not eat a lot of foods that are high in solid fats, added sugars, or sodium. Maintain a healthy weight Body mass index (BMI) is used to identify weight problems. It estimates body fat based on height and weight. Your health care provider can help determine your BMI and help you achieve or maintain a healthy weight. Get regular exercise Get regular exercise. This is one of the most important things you can do for your health. Most adults should:  Exercise for at least 150 minutes each week. The exercise should increase your heart rate and make you sweat (moderate-intensity exercise).  Do strengthening exercises at least twice a week. This is in addition to the moderate-intensity exercise.  Spend less time sitting. Even light physical activity can be beneficial. Watch cholesterol and blood lipids Have your blood tested for lipids and cholesterol at 62 years of age, then have this test every 5 years. Have your cholesterol levels checked more often if:  Your lipid or cholesterol levels are high.  You are older than 61 years of age.  You are at high risk for heart disease. What should I know about cancer screening? Depending on your health history and family history, you may need to have cancer screening at various ages. This may include screening for:  Breast cancer.  Cervical cancer.  Colorectal cancer.  Skin cancer.  Lung  cancer. What should I know about heart disease, diabetes, and high blood pressure? Blood pressure and heart disease  High blood pressure causes heart disease and increases the risk of stroke. This is more likely to develop in people who have high blood pressure readings, are of African descent, or are overweight.  Have your blood pressure checked: ? Every 3-5 years if you are 57-39 years of age. ? Every year if you are 80 years old or older. Diabetes Have regular diabetes screenings. This checks your fasting blood sugar level. Have the screening done:  Once every three years after age 68 if you are at a normal weight and have a low risk for diabetes.  More often and at a younger age if you are overweight or have a high risk for diabetes. What should I know about preventing infection? Hepatitis B If you have a higher risk for hepatitis B, you should be screened for this virus. Talk with your health care provider to find out if you are at risk for hepatitis B infection. Hepatitis C Testing is recommended for:  Everyone born from [birth year] through 1963.  Anyone with known risk factors for hepatitis C. Sexually transmitted infections (STIs)  Get screened for STIs, including gonorrhea and chlamydia, if: ? You are sexually active and are younger than 62 years of age. ? You are older than 62 years of age and your health care provider tells you that you are at risk for this type of infection. ? Your sexual activity has changed since you were last screened, and you are at  increased risk for chlamydia or gonorrhea. Ask your health care provider if you are at risk.  Ask your health care provider about whether you are at high risk for HIV. Your health care provider may recommend a prescription medicine to help prevent HIV infection. If you choose to take medicine to prevent HIV, you should first get tested for HIV. You should then be tested every 3 months for as long as you are taking the  medicine. Pregnancy  If you are about to stop having your period (premenopausal) and you may become pregnant, seek counseling before you get pregnant.  Take 400 to 800 micrograms (mcg) of folic acid every day if you become pregnant.  Ask for birth control (contraception) if you want to prevent pregnancy. Osteoporosis and menopause Osteoporosis is a disease in which the bones lose minerals and strength with aging. This can result in bone fractures. If you are 24 years old or older, or if you are at risk for osteoporosis and fractures, ask your health care provider if you should:  Be screened for bone loss.  Take a calcium or vitamin D supplement to lower your risk of fractures.  Be given hormone replacement therapy (HRT) to treat symptoms of menopause. Follow these instructions at home: Lifestyle  Do not use any products that contain nicotine or tobacco, such as cigarettes, e-cigarettes, and chewing tobacco. If you need help quitting, ask your health care provider.  Do not use street drugs.  Do not share needles.  Ask your health care provider for help if you need support or information about quitting drugs. Alcohol use  Do not drink alcohol if: ? Your health care provider tells you not to drink. ? You are pregnant, may be pregnant, or are planning to become pregnant.  If you drink alcohol: ? Limit how much you use to 0-1 drink a day. ? Limit intake if you are breastfeeding.  Be aware of how much alcohol is in your drink. In the [place] one drink equals one 12 oz bottle of beer (355 mL), one 5 oz glass of wine (148 mL), or one 1 oz glass of hard liquor (44 mL). General instructions  Schedule regular health, dental, and eye exams.  Stay current with your vaccines.  Tell your health care provider if: ? You often feel depressed. ? You have ever been abused or do not feel safe at home. Summary  Adopting a healthy lifestyle and getting preventive care are important in  promoting health and wellness.  Follow your health care provider's instructions about healthy diet, exercising, and getting tested or screened for diseases.  Follow your health care provider's instructions on monitoring your cholesterol and blood pressure. This information is not intended to replace advice given to you by your health care provider. Make sure you discuss any questions you have with your health care provider. Document Revised: 06/08/2018 Document Reviewed: 06/08/2018 Elsevier Patient Education  2020 [employer].

## 2020-05-10 ENCOUNTER — Encounter: Payer: Self-pay | Admitting: Physician Assistant

## 2020-05-10 NOTE — Progress Notes (Signed)
Subjective:   Bonnie Torres is a 64 y.o. female who presents for Medicare Annual (Subsequent) preventive examination.  Review of Systems    No problems or concerns.        Objective:    Today's Vitals   05/08/20 1454  BP: 102/65  Pulse: 76  SpO2: 100%  Weight: 156 lb (70.8 kg)  Height: 4' 10.25" (1.48 m)   Body mass index is 32.32 kg/m.  Advanced Directives 04/18/2020 11/30/2019 11/20/2019 04/25/2019 04/04/2018 12/29/2017 06/18/2016  Does Patient Have a Medical Advance Directive? _0  No No  Would patient like information on creating a medical advance directive? Yes (MAU/Ambulatory/Procedural Areas - Information given) No - Patient declined No - Patient declined No - Patient declined Yes (MAU/Ambulatory/Procedural Areas - Information given) No - Patient declined No - Patient declined    Current Medications (verified) Outpatient Encounter Medications as of 05/08/2020  Medication Sig  . atorvastatin (LIPITOR) 40 MG tablet Take 1 tablet (40 mg total) by mouth daily.  . Blood Glucose Monitoring Suppl (ONETOUCH VERIO FLEX SYSTEM) w/Device KIT Check fasting blood sugar every morning. DM ICD10 E11.9  . clindamycin (CLEOCIN T) 1 % external solution Apply 1 application topically 2 (two) times daily.  . diclofenac Sodium (VOLTAREN) 1 % GEL Apply 4 g topically 4 (four) times daily. To affected joint.  [name] glucose blood (ONETOUCH VERIO) test strip 1 each by Other route as needed for other (check blood sugars twice daily). Use as instructed  . metFORMIN (GLUCOPHAGE-XR) 750 MG 24 hr tablet Take 1 tablet (750 mg total) by mouth daily with breakfast.  . metoprolol succinate (TOPROL-XL) 100 MG 24 hr tablet Take 1 tablet (100 mg total) by mouth daily.  . Microlet Lancets MISC Use to check blood sugars twice daily  . Olmesartan-amLODIPine-HCTZ 20-5-12.5 MG TABS Take 1 tablet by mouth daily.  . potassium chloride SA (KLOR-CON) 20 MEQ tablet TAKE 1 TABLET BY MOUTH DAILY  . tretinoin  (RETIN-A) 0.05 % cream Apply 1 application topically at bedtime.   [name] zolpidem (AMBIEN) 10 MG tablet Take 1 tablet (10 mg total) by mouth at bedtime.  . [DISCONTINUED] HYDROcodone-acetaminophen (NORCO) 5-325 MG tablet Take 1-2 tablets by mouth every 6 (six) hours as needed for moderate pain.   No facility-administered encounter medications on file as of 05/08/2020.    Allergies (verified) Patient has no known allergies.   History: Past Medical History:  Diagnosis Date  . Complication of anesthesia   . Gout    1 yr ago right great toe  . Heart rate fast    tx Metoprolol  . Hyperlipidemia   . Hypertension   . PONV (postoperative nausea and vomiting)   . 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 Type 2 diabetes mellitus without complication, without long-term current use of insulin ([place]) 12/03/2017   Past Surgical History:  Procedure Laterality Date  . ABDOMINAL HYSTERECTOMY N/A 06/18/2016   Procedure: HYSTERECTOMY ABDOMINAL TOTAL;  Surgeon: Everitt Amber, MD;  Location: [facility];  Service: Gynecology;  Laterality: N/A;  . BREAST EXCISIONAL BIOPSY Right   . BREAST SURGERY Right    breast biopsy-benign  . CHOLECYSTECTOMY    . MASS EXCISION N/A 06/18/2016   Procedure: RESECTION RETROPERITONEAL MASS WITH RETORPERITONEAL EXPLORATION ,LIGATION CHYLI CISTERNA,OPEN  CHOLECYSTECTOMY WITH MOBILATION HEPATIC FLEXURE;  Surgeon: Hall Busing, MD;  Location: [facility];  Service: General;  Laterality: N/A;  . PARTIAL NEPHRECTOMY Right 06/18/2016   Procedure: OPEN RIGHT NEPHRECTOMY PARTIAL;  Surgeon: Wynetta Emery  Alinda Money, MD;  Location: [facility];  Service: Urology;  Laterality: Right;  . ROBOTIC ASSITED PARTIAL NEPHRECTOMY Left 11/30/2019   Procedure: ATTEMPTED XI ROBOTIC ASSITED LAPAROSCOPIC CONVERTED TO OPEN PARTIAL NEPHRECTOMY;  Surgeon: Raynelle Bring, MD;  Location: [facility];  Service: Urology;  Laterality: Left;  . SALPINGOOPHORECTOMY Bilateral 06/18/2016   Procedure: BILATERAL SALPINGO OOPHORECTOMY;   Surgeon: Everitt Amber, MD;  Location: [facility];  Service: Gynecology;  Laterality: Bilateral;  . SHOULDER SURGERY Right 2010  . TOE SURGERY Right Hammer Toe   2004  . TONSILLECTOMY     Family History  Problem Relation Age of Onset  . Heart attack Paternal Aunt   . Hypertension Mother   . Diabetes Maternal Aunt   . Cancer Paternal Aunt        stomach  . Cancer Sister    Social History   Socioeconomic History  . Marital status: Widowed    Spouse name: Not on file  . Number of children: 1  . Years of education: 35  . Highest education level: 12th grade  Occupational History  . 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 Smoking status: Never Smoker  . Smokeless tobacco: Never Used  Vaping Use  . Vaping Use: Never used  Substance and Sexual Activity  . Alcohol use: No    Alcohol/week: 0.0 standard drinks  . Drug use: No  . Sexual activity: Not Currently  Other Topics Concern  . Not on file  Social History Narrative   Semi retired- still working at [employer] Strain:   . Difficulty of Paying Living Expenses: Not on file  Food Insecurity:   . Worried About Charity fundraiser in the Last Year: Not on file  . Ran Out of Food in the Last Year: Not on file  Transportation Needs:   . Lack of Transportation (Medical): Not on file  . Lack of Transportation (Non-Medical): Not on file  Physical Activity:   . Days of Exercise per Week: Not on file  . Minutes of Exercise per Session: Not on file  Stress:   . Feeling of Stress : Not on file  Social Connections:   . Frequency of Communication with Friends and Family: Not on file  . Frequency of Social Gatherings with Friends and Family: Not on file  . Attends Religious Services: Not on file  . Active Member of Clubs or Organizations: Not on file  . Attends Archivist Meetings: Not on file  . Marital Status: Not on file    Tobacco  Counseling Counseling given: Not Answered   Clinical Intake:                 Diabetic yes and will recheck A1C today.          Activities of Daily Living In your present state of health, do you have any difficulty performing the following activities: 05/08/2020 04/18/2020  Hearing? N N  Vision? N N  Difficulty concentrating or making decisions? N N  Walking or climbing stairs? N N  Dressing or bathing? N N  Doing errands, shopping? N N  Preparing Food and eating ? - N  Using the Toilet? - N  In the past six months, have you accidently leaked urine? - N  Do you have problems with loss of bowel control? - N  Managing your Medications? - N  Managing your Finances? - N  Housekeeping or managing your Housekeeping? - N  Some recent data might be hidden    Patient Care Team: Lavada Mesi as PCP - General (Family Medicine)  Indicate any recent Medical Services you may have received from other than [facility] providers in the past year (date may be approximate).     Assessment:   This is a routine wellness examination for Milyn.  Hearing/Vision screen No exam data present  Dietary issues and exercise activities discussed:    Goals    . Exercise 3x per week (30 min per time)     Start exercising at least 3 times a week for 30 minutes a day.    . Patient Stated     Patient stated needss to change her eating habits and start eating better      Depression Screen PHQ 2/9 Scores 05/08/2020 04/18/2020 04/25/2019 12/26/2018 06/28/2018 04/04/2018 12/29/2017  PHQ - 2 Score 0 0 0 0 0 0 0  PHQ- 9 Score 0 - - 0 0 - -    Fall Risk Fall Risk  05/08/2020 04/18/2020 04/25/2019 04/04/2018 12/29/2017  Falls in the past year? 0 0 0 No No  Number falls in past yr: 0 0 0 - -  Injury with Fall? 0 0 0 - -  Follow up Falls evaluation completed Falls evaluation completed Falls prevention discussed - -    Any stairs in or around the home? No  If so, are there any without  handrails? No  Home free of loose throw rugs in walkways, pet beds, electrical cords, etc? Yes  Adequate lighting in your home to reduce risk of falls? Yes   ASSISTIVE DEVICES UTILIZED TO PREVENT FALLS:  Life alert? No  Use of a cane, walker or w/c? No  Grab bars in the bathroom? No  Shower chair or bench in shower? No  Elevated toilet seat or a handicapped toilet? No   TIMED UP AND GO:  Was the test performed? Yes .  Length of time to ambulate 10 feet: 4 sec.   Gait steady and fast without use of assistive device  Cognitive Function:     6CIT Screen 05/08/2020 04/18/2020 04/25/2019 04/04/2018  What Year? 0 points 0 points 0 points 0 points  What month? 0 points 0 points 0 points 0 points  What time? 0 points 0 points 0 points 0 points  Count back from 20 0 points 0 points 0 points 0 points  Months in reverse 0 points 0 points 0 points 0 points  Repeat phrase 2 points 0 points 0 points 0 points  Total Score 2 0 0 0    Immunizations Immunization History  Administered Date(s) Administered  . Influenza,inj,Quad PF,6+ Mos 04/15/2015, 03/23/2016, 03/31/2017, 03/07/2018, 04/05/2019, 04/18/2020  . Influenza-Unspecified 04/28/2011, 05/29/2013  . PFIZER SARS-COV-2 Vaccination 09/28/2019, 10/26/2019  . PPD Test 02/16/2018  . Pneumococcal Polysaccharide-23 12/01/2017  . Tdap 12/09/2010, 10/12/2014  . Zoster Recombinat (Shingrix) 12/26/2018    TDAP status: Up to date Flu Vaccine status: Up to date Pneumococcal vaccine status: Up to date Covid-19 vaccine status: Completed vaccines  Qualifies for Shingles Vaccine? Yes   Zostavax completed No   She has had first shot. Needs second vaccine. Needs to get at local pharmacy.  Shingrix Completed?: No.    Education has been provided regarding the importance of this vaccine. Patient has been advised to call insurance company to determine out of pocket expense if they have not yet received this vaccine. Advised may also receive  vaccine at  local pharmacy or Health Dept. Verbalized acceptance and understanding.  Screening Tests Health Maintenance  Topic Date Due  . Fecal DNA (Cologuard)  04/13/2020  . HEMOGLOBIN A1C  11/05/2020  . FOOT EXAM  01/28/2021  . OPHTHALMOLOGY EXAM  03/28/2021  . MAMMOGRAM  07/11/2021  . TETANUS/TDAP  10/11/2024  . INFLUENZA VACCINE  Completed  . PNEUMOCOCCAL POLYSACCHARIDE VACCINE AGE 30-64 HIGH RISK  Completed  . COVID-19 Vaccine  Completed  . Hepatitis C Screening  Completed  . HIV Screening  Completed    Health Maintenance  Health Maintenance Due  Topic Date Due  . Fecal DNA (Cologuard)  04/13/2020    Colorectal cancer screening: Completed declined. ordered cologuard. Repeat every 3 years Mammogram status: Completed 07/12/2019. Repeat every year Bone Density status: Ordered will order with mammogram next year. [name] Pt provided with contact info and advised to call to schedule appt.  Lung Cancer Screening: (Low Dose CT Chest recommended if Age 38-80 years, 30 pack-year currently smoking OR have quit w/in 15years.) does not qualify.   Lung Cancer Screening Referral: N/A Additional Screening:  Hepatitis C Screening: does qualify; Completed 10/14/2019  Vision Screening: Recommended annual ophthalmology exams for early detection of glaucoma and other disorders of the eye. Is the patient up to date with their annual eye exam?  Yes  Who is the provider or what is the name of the office in which the patient attends annual eye exams?   If pt is not established with a provider, would they like to be referred to a provider to establish care? No .   Dental Screening: Recommended annual dental exams for proper oral hygiene     Plan:     I have personally reviewed and noted the following in the patient's chart:   . Medical and social history . Use of alcohol, tobacco or illicit drugs  . Current medications and supplements . Functional ability and status . Nutritional status . Physical  activity . Advanced directives . List of other physicians . Hospitalizations, surgeries, and ER visits in previous 12 months . Vitals . Screenings to include cognitive, depression, and falls . Referrals and appointments  In addition, I have reviewed and discussed with patient certain preventive protocols, quality metrics, and best practice recommendations. A written personalized care plan for preventive services as well as general preventive health recommendations were provided to patient.    .. Lab Results  Component Value Date   HGBA1C 6.1 (A) 05/08/2020   Stable.  Continue same medications.  BP to goal.  On statin. UTD eye and foot exam.  Follow up in 6 months.    Iran Planas, PA-C   05/08/2020

## 2020-05-27 DIAGNOSIS — Z1211 Encounter for screening for malignant neoplasm of colon: Secondary | ICD-10-CM | POA: Diagnosis not present

## 2020-05-27 LAB — COLOGUARD: Cologuard: NEGATIVE

## 2020-06-03 ENCOUNTER — Other Ambulatory Visit: Payer: Self-pay | Admitting: Physician Assistant

## 2020-06-03 DIAGNOSIS — I10 Essential (primary) hypertension: Secondary | ICD-10-CM

## 2020-06-05 DIAGNOSIS — L700 Acne vulgaris: Secondary | ICD-10-CM | POA: Diagnosis not present

## 2020-06-05 DIAGNOSIS — L2089 Other atopic dermatitis: Secondary | ICD-10-CM | POA: Diagnosis not present

## 2020-06-05 LAB — COLOGUARD: COLOGUARD: NEGATIVE

## 2020-06-05 NOTE — Telephone Encounter (Signed)
Patient made aware negative result received.

## 2020-06-09 ENCOUNTER — Other Ambulatory Visit: Payer: Self-pay | Admitting: Physician Assistant

## 2020-06-09 DIAGNOSIS — M722 Plantar fascial fibromatosis: Secondary | ICD-10-CM

## 2020-06-11 ENCOUNTER — Other Ambulatory Visit: Payer: Self-pay | Admitting: Physician Assistant

## 2020-06-11 DIAGNOSIS — Z1231 Encounter for screening mammogram for malignant neoplasm of breast: Secondary | ICD-10-CM

## 2020-06-13 ENCOUNTER — Other Ambulatory Visit: Payer: Self-pay | Admitting: Neurology

## 2020-06-13 DIAGNOSIS — Z13820 Encounter for screening for osteoporosis: Secondary | ICD-10-CM

## 2020-06-13 NOTE — Progress Notes (Signed)
Patient called about bone density order. No orders in chart. Wants done with her MGM. Ordered. Tried to call and let patient know. NO answer. VM full.

## 2020-06-19 ENCOUNTER — Other Ambulatory Visit: Payer: Self-pay

## 2020-06-19 ENCOUNTER — Ambulatory Visit (INDEPENDENT_AMBULATORY_CARE_PROVIDER_SITE_OTHER): Payer: Medicare Other

## 2020-06-19 DIAGNOSIS — Z13820 Encounter for screening for osteoporosis: Secondary | ICD-10-CM

## 2020-06-19 DIAGNOSIS — Z780 Asymptomatic menopausal state: Secondary | ICD-10-CM | POA: Diagnosis not present

## 2020-06-22 ENCOUNTER — Other Ambulatory Visit: Payer: Self-pay | Admitting: Physician Assistant

## 2020-06-22 DIAGNOSIS — E119 Type 2 diabetes mellitus without complications: Secondary | ICD-10-CM

## 2020-06-22 DIAGNOSIS — I10 Essential (primary) hypertension: Secondary | ICD-10-CM

## 2020-07-01 NOTE — Progress Notes (Signed)
Bonnie Torres,   Great news. Your bone density is normal. Keep taking vitamin D and calcium.

## 2020-07-02 DIAGNOSIS — Z20822 Contact with and (suspected) exposure to covid-19: Secondary | ICD-10-CM | POA: Diagnosis not present

## 2020-07-17 DIAGNOSIS — C642 Malignant neoplasm of left kidney, except renal pelvis: Secondary | ICD-10-CM | POA: Diagnosis not present

## 2020-07-18 DIAGNOSIS — N200 Calculus of kidney: Secondary | ICD-10-CM | POA: Diagnosis not present

## 2020-07-18 DIAGNOSIS — C642 Malignant neoplasm of left kidney, except renal pelvis: Secondary | ICD-10-CM | POA: Diagnosis not present

## 2020-07-18 DIAGNOSIS — Z0389 Encounter for observation for other suspected diseases and conditions ruled out: Secondary | ICD-10-CM | POA: Diagnosis not present

## 2020-07-19 ENCOUNTER — Other Ambulatory Visit: Payer: Self-pay | Admitting: Physician Assistant

## 2020-07-19 DIAGNOSIS — F5101 Primary insomnia: Secondary | ICD-10-CM

## 2020-07-19 NOTE — Telephone Encounter (Signed)
Last written 01/29/2020 #90 with 1 refill Had medicare wellness 05/08/2020

## 2020-07-24 DIAGNOSIS — Z85528 Personal history of other malignant neoplasm of kidney: Secondary | ICD-10-CM | POA: Diagnosis not present

## 2020-07-24 DIAGNOSIS — R8279 Other abnormal findings on microbiological examination of urine: Secondary | ICD-10-CM | POA: Diagnosis not present

## 2020-08-02 ENCOUNTER — Other Ambulatory Visit: Payer: Self-pay | Admitting: Physician Assistant

## 2020-08-02 DIAGNOSIS — E876 Hypokalemia: Secondary | ICD-10-CM

## 2020-08-07 ENCOUNTER — Other Ambulatory Visit: Payer: Self-pay

## 2020-08-07 ENCOUNTER — Ambulatory Visit (INDEPENDENT_AMBULATORY_CARE_PROVIDER_SITE_OTHER): Payer: Medicare Other

## 2020-08-07 DIAGNOSIS — Z1231 Encounter for screening mammogram for malignant neoplasm of breast: Secondary | ICD-10-CM | POA: Diagnosis not present

## 2020-08-07 IMAGING — MG MM DIGITAL SCREENING BILAT W/ TOMO AND CAD
8 series · 8 of 24 positions shown · non-contrast
Comparison: Previous exam(s).

CLINICAL DATA: Screening.

EXAM:
DIGITAL SCREENING BILATERAL MAMMOGRAM WITH TOMOSYNTHESIS AND CAD
TECHNIQUE: Bilateral screening digital craniocaudal and mediolateral oblique
mammograms were obtained. Bilateral screening digital breast
tomosynthesis was performed. The images were evaluated with
computer-aided detection.

[L CC synth-2D]
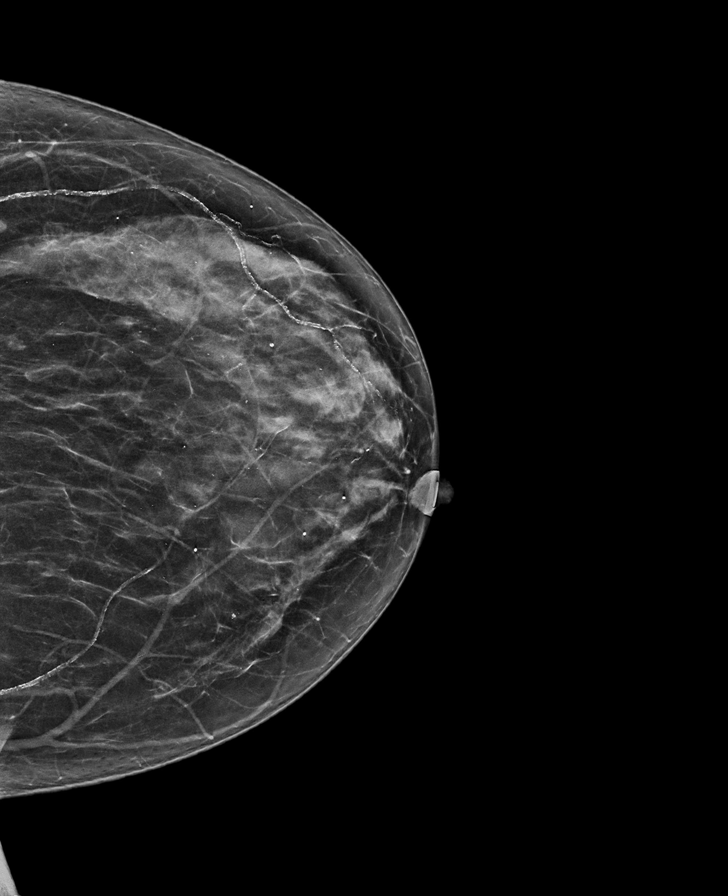

[R CC synth-2D]
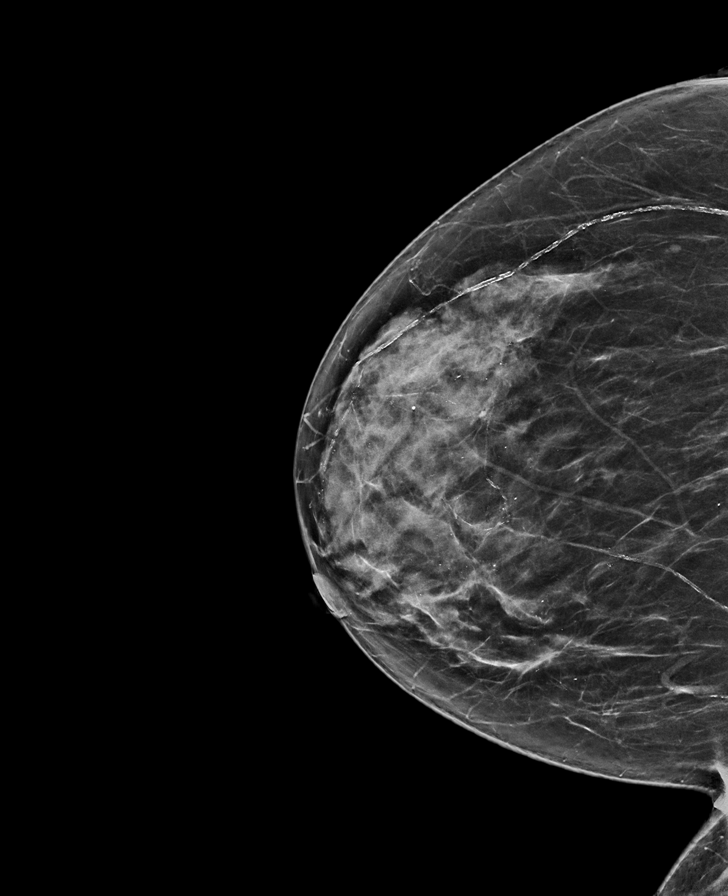

[R MLO synth-2D]
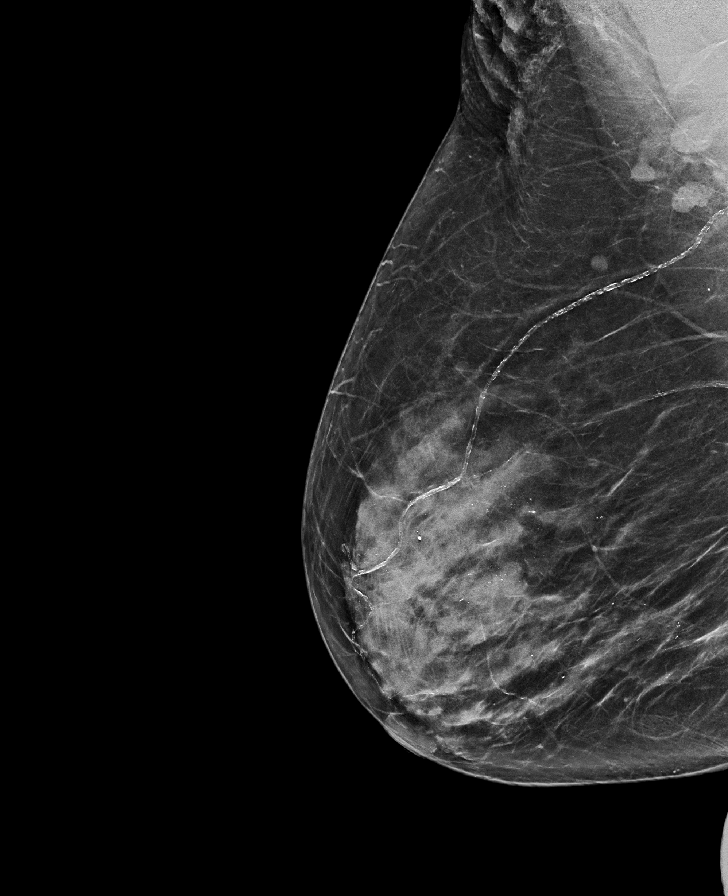

[L MLO synth-2D]
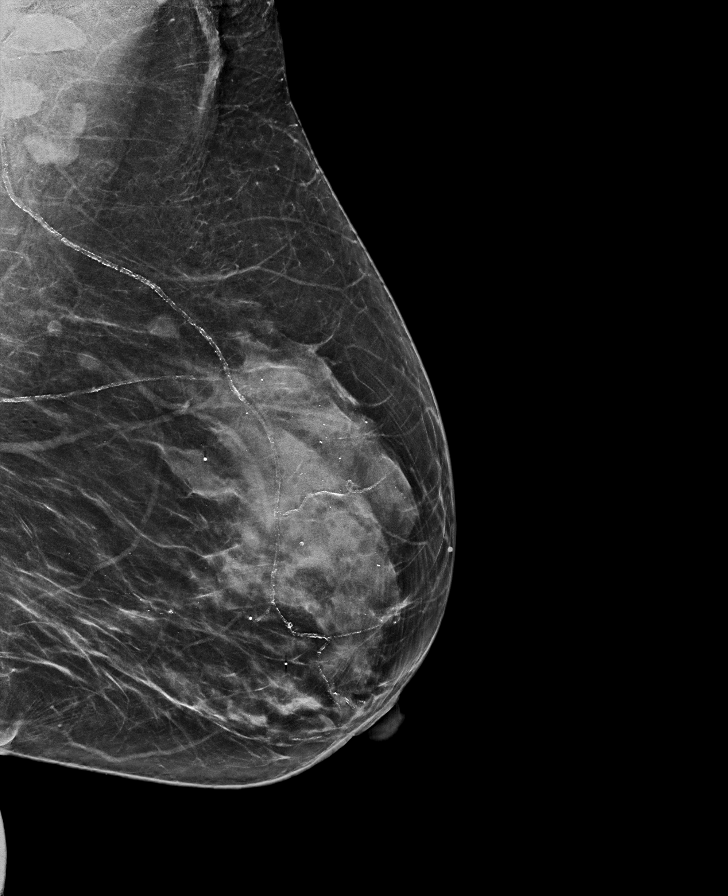

[R CC tomo · tomo slice 35/68.0]
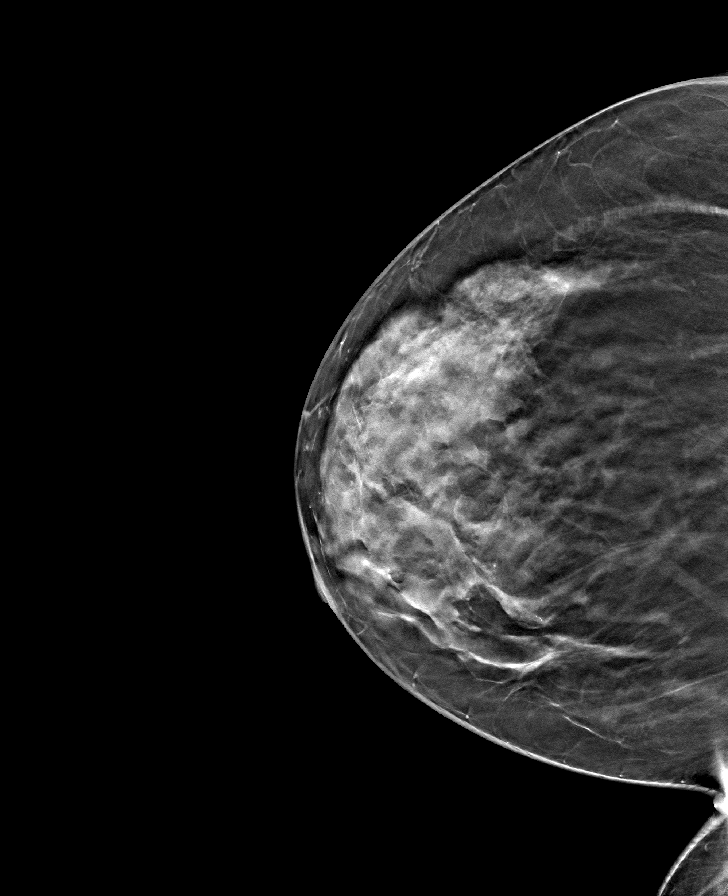

[L CC tomo · tomo slice 31/61.0]
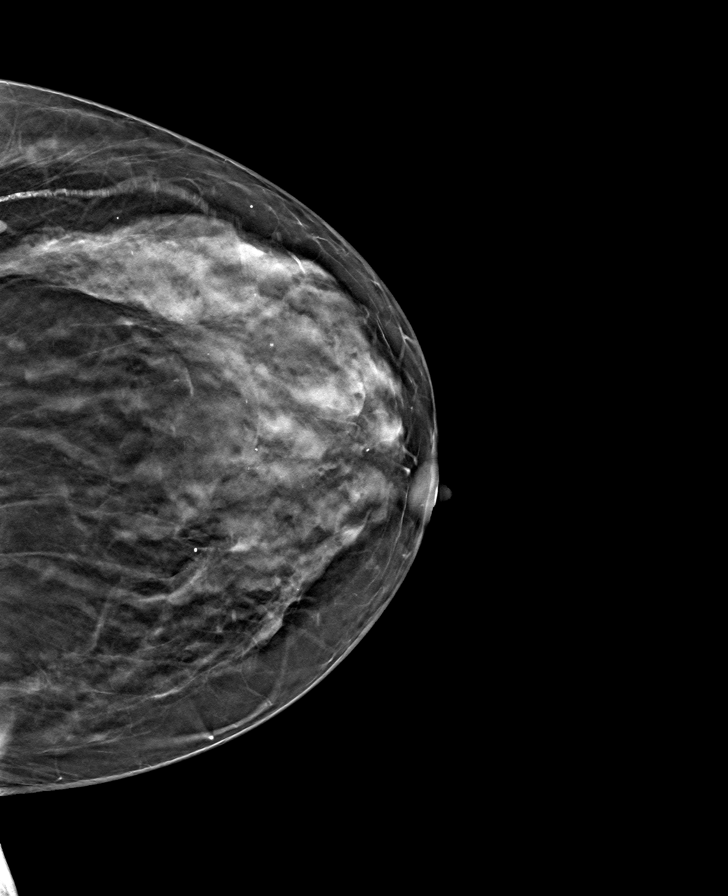

[R MLO tomo · tomo slice 37/73.0]
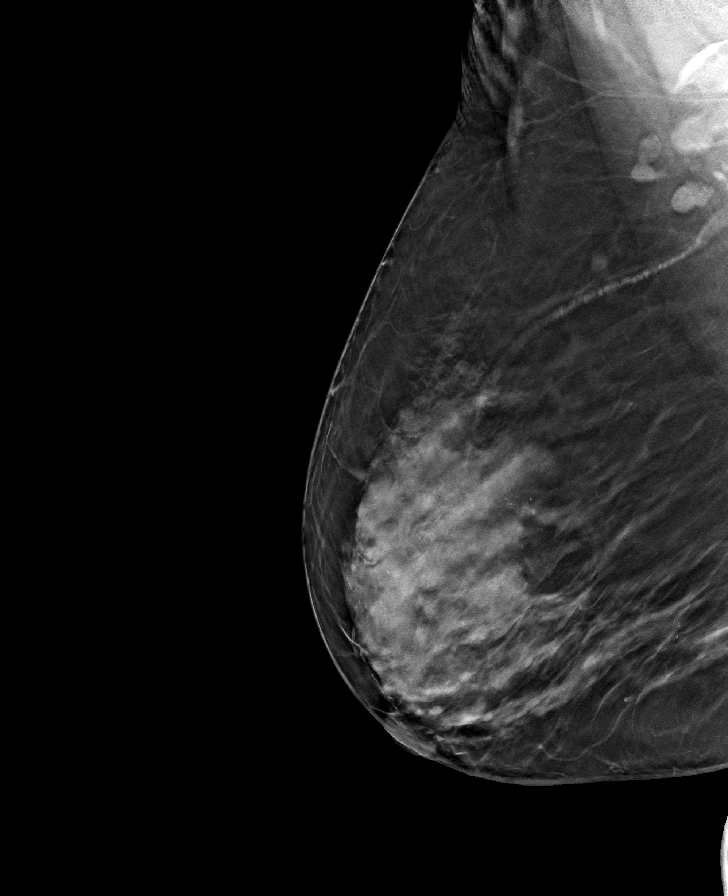

[L MLO tomo · tomo slice 35/70.0]
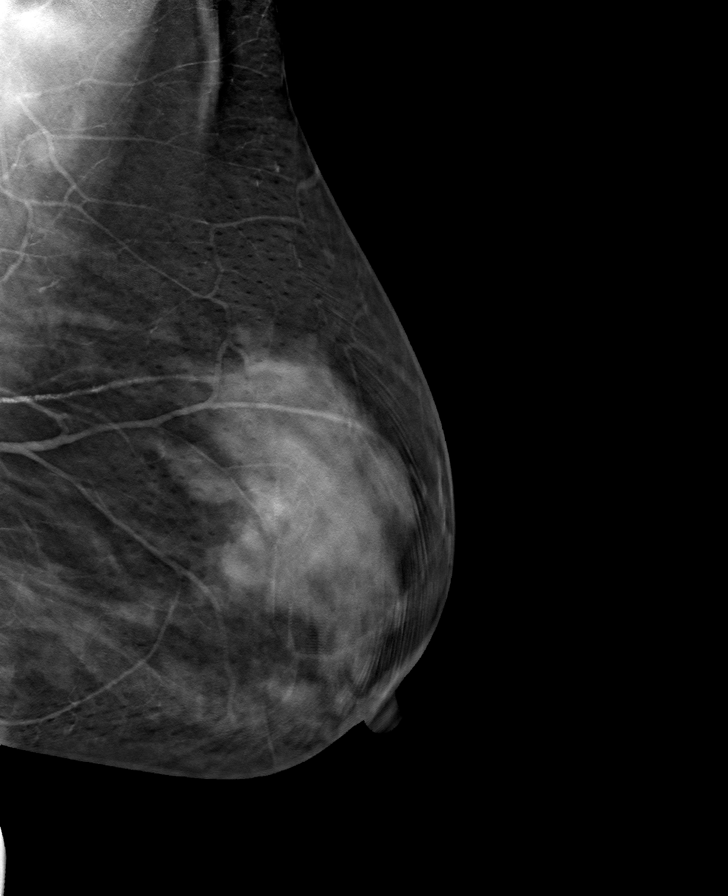

[8 of 24 positions shown; findings below may reference images not displayed]

ACR Breast Density Category c: The breast tissue is heterogeneously
dense, which may obscure small masses.
FINDINGS: There are no findings suspicious for malignancy.
IMPRESSION: No mammographic evidence of malignancy. A result letter of this
screening mammogram will be mailed directly to the patient.

RECOMMENDATION:
Screening mammogram in one year. (Code:[V2])

BI-RADS CATEGORY  1: Negative.

## 2020-08-12 NOTE — Progress Notes (Signed)
Normal mammogram. Follow up in 1 year.

## 2020-08-20 ENCOUNTER — Other Ambulatory Visit: Payer: Self-pay | Admitting: Physician Assistant

## 2020-08-20 DIAGNOSIS — I10 Essential (primary) hypertension: Secondary | ICD-10-CM

## 2020-09-19 ENCOUNTER — Other Ambulatory Visit: Payer: Self-pay | Admitting: Physician Assistant

## 2020-09-19 DIAGNOSIS — I10 Essential (primary) hypertension: Secondary | ICD-10-CM

## 2020-09-19 DIAGNOSIS — E119 Type 2 diabetes mellitus without complications: Secondary | ICD-10-CM

## 2020-09-25 ENCOUNTER — Other Ambulatory Visit: Payer: Self-pay | Admitting: Physician Assistant

## 2020-09-25 DIAGNOSIS — E782 Mixed hyperlipidemia: Secondary | ICD-10-CM

## 2020-10-18 ENCOUNTER — Other Ambulatory Visit: Payer: Self-pay | Admitting: Neurology

## 2020-10-18 DIAGNOSIS — I10 Essential (primary) hypertension: Secondary | ICD-10-CM

## 2020-10-18 MED ORDER — OLMESARTAN-AMLODIPINE-HCTZ 20-5-12.5 MG PO TABS: 1.0000 | ORAL_TABLET | Freq: Every day | ORAL | 0 refills | Status: DC

## 2020-10-22 ENCOUNTER — Other Ambulatory Visit: Payer: Self-pay | Admitting: Physician Assistant

## 2020-10-22 DIAGNOSIS — E876 Hypokalemia: Secondary | ICD-10-CM

## 2020-11-05 ENCOUNTER — Encounter: Payer: Self-pay | Admitting: Physician Assistant

## 2020-11-05 ENCOUNTER — Ambulatory Visit (INDEPENDENT_AMBULATORY_CARE_PROVIDER_SITE_OTHER): Payer: Medicare Other | Admitting: Physician Assistant

## 2020-11-05 ENCOUNTER — Other Ambulatory Visit: Payer: Self-pay

## 2020-11-05 ENCOUNTER — Ambulatory Visit (INDEPENDENT_AMBULATORY_CARE_PROVIDER_SITE_OTHER): Payer: Medicare Other

## 2020-11-05 VITALS — BP 119/67 | HR 77 | Temp 98.2°F | Resp 20 | Ht 58.25 in | Wt 157.0 lb

## 2020-11-05 DIAGNOSIS — M5136 Other intervertebral disc degeneration, lumbar region: Secondary | ICD-10-CM

## 2020-11-05 DIAGNOSIS — N1832 Chronic kidney disease, stage 3b: Secondary | ICD-10-CM

## 2020-11-05 DIAGNOSIS — M549 Dorsalgia, unspecified: Secondary | ICD-10-CM

## 2020-11-05 DIAGNOSIS — I10 Essential (primary) hypertension: Secondary | ICD-10-CM | POA: Diagnosis not present

## 2020-11-05 DIAGNOSIS — R040 Epistaxis: Secondary | ICD-10-CM

## 2020-11-05 DIAGNOSIS — M5442 Lumbago with sciatica, left side: Secondary | ICD-10-CM

## 2020-11-05 DIAGNOSIS — F5101 Primary insomnia: Secondary | ICD-10-CM

## 2020-11-05 DIAGNOSIS — E119 Type 2 diabetes mellitus without complications: Secondary | ICD-10-CM

## 2020-11-05 DIAGNOSIS — G8929 Other chronic pain: Secondary | ICD-10-CM | POA: Diagnosis not present

## 2020-11-05 DIAGNOSIS — Z1211 Encounter for screening for malignant neoplasm of colon: Secondary | ICD-10-CM

## 2020-11-05 DIAGNOSIS — E876 Hypokalemia: Secondary | ICD-10-CM | POA: Diagnosis not present

## 2020-11-05 DIAGNOSIS — M545 Low back pain, unspecified: Secondary | ICD-10-CM | POA: Diagnosis not present

## 2020-11-05 DIAGNOSIS — M546 Pain in thoracic spine: Secondary | ICD-10-CM | POA: Diagnosis not present

## 2020-11-05 LAB — POCT GLYCOSYLATED HEMOGLOBIN (HGB A1C): Hemoglobin A1C: 6 % — AB (ref 4.0–5.6)

## 2020-11-05 IMAGING — DX DG THORACIC SPINE 3V
3 series · 3 of 3 positions shown · non-contrast
Comparison: None.

CLINICAL DATA: Mid back pain.  No known injury.

EXAM:
THORACIC SPINE - 3 VIEWS

[t-spine ap]
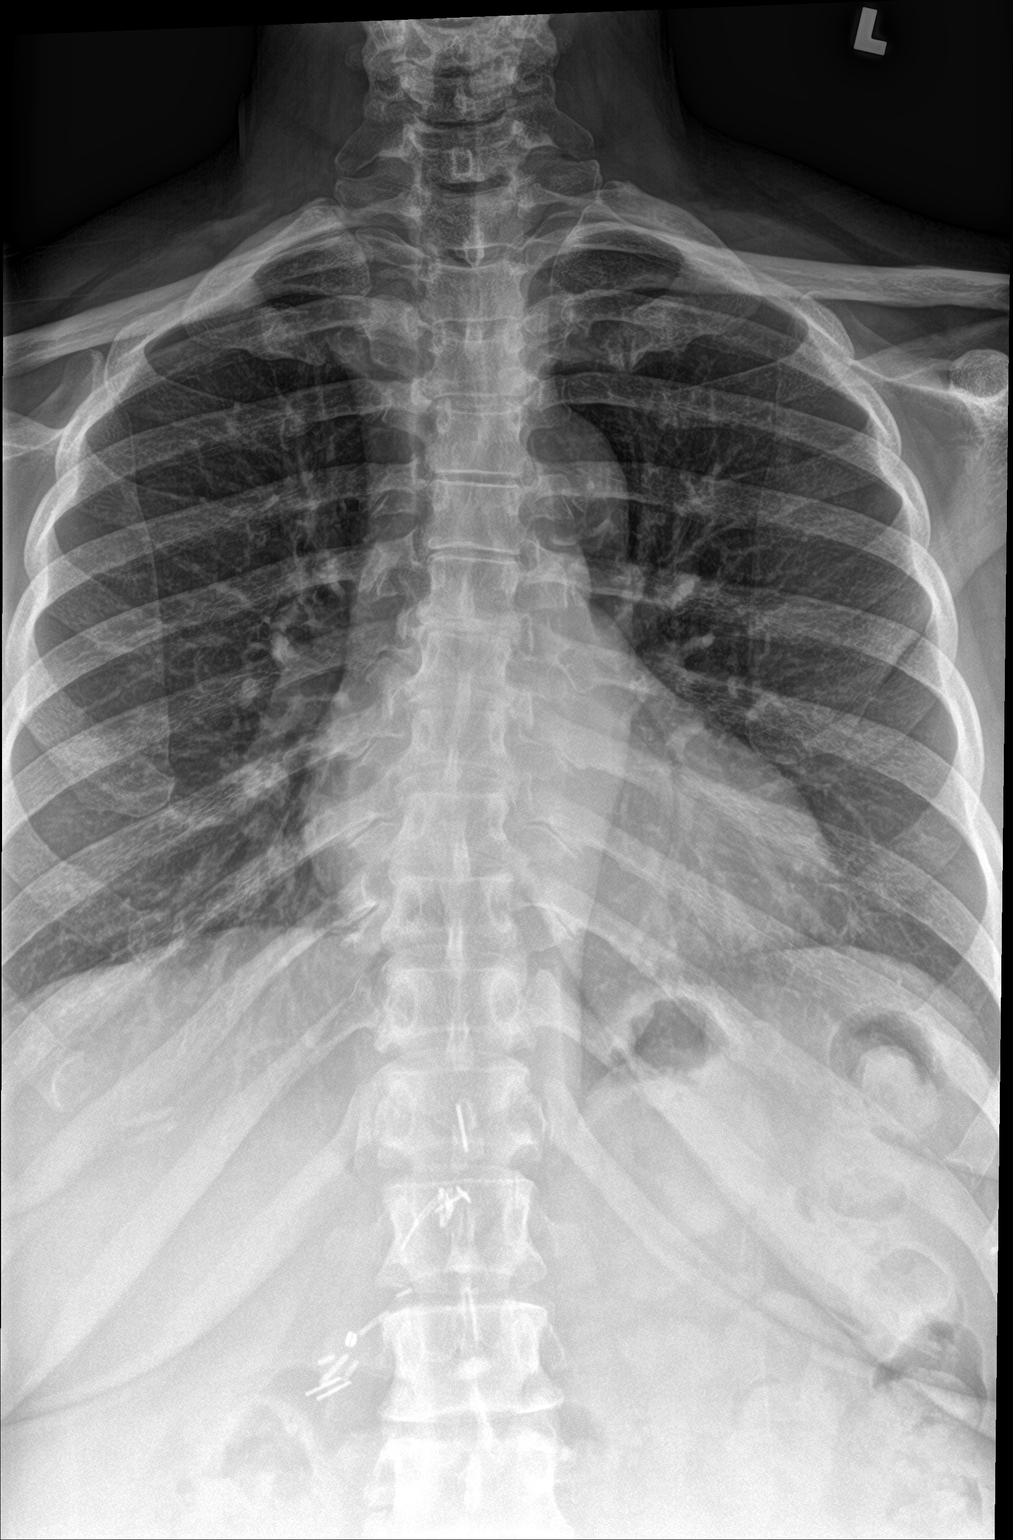

[t-spine lat]
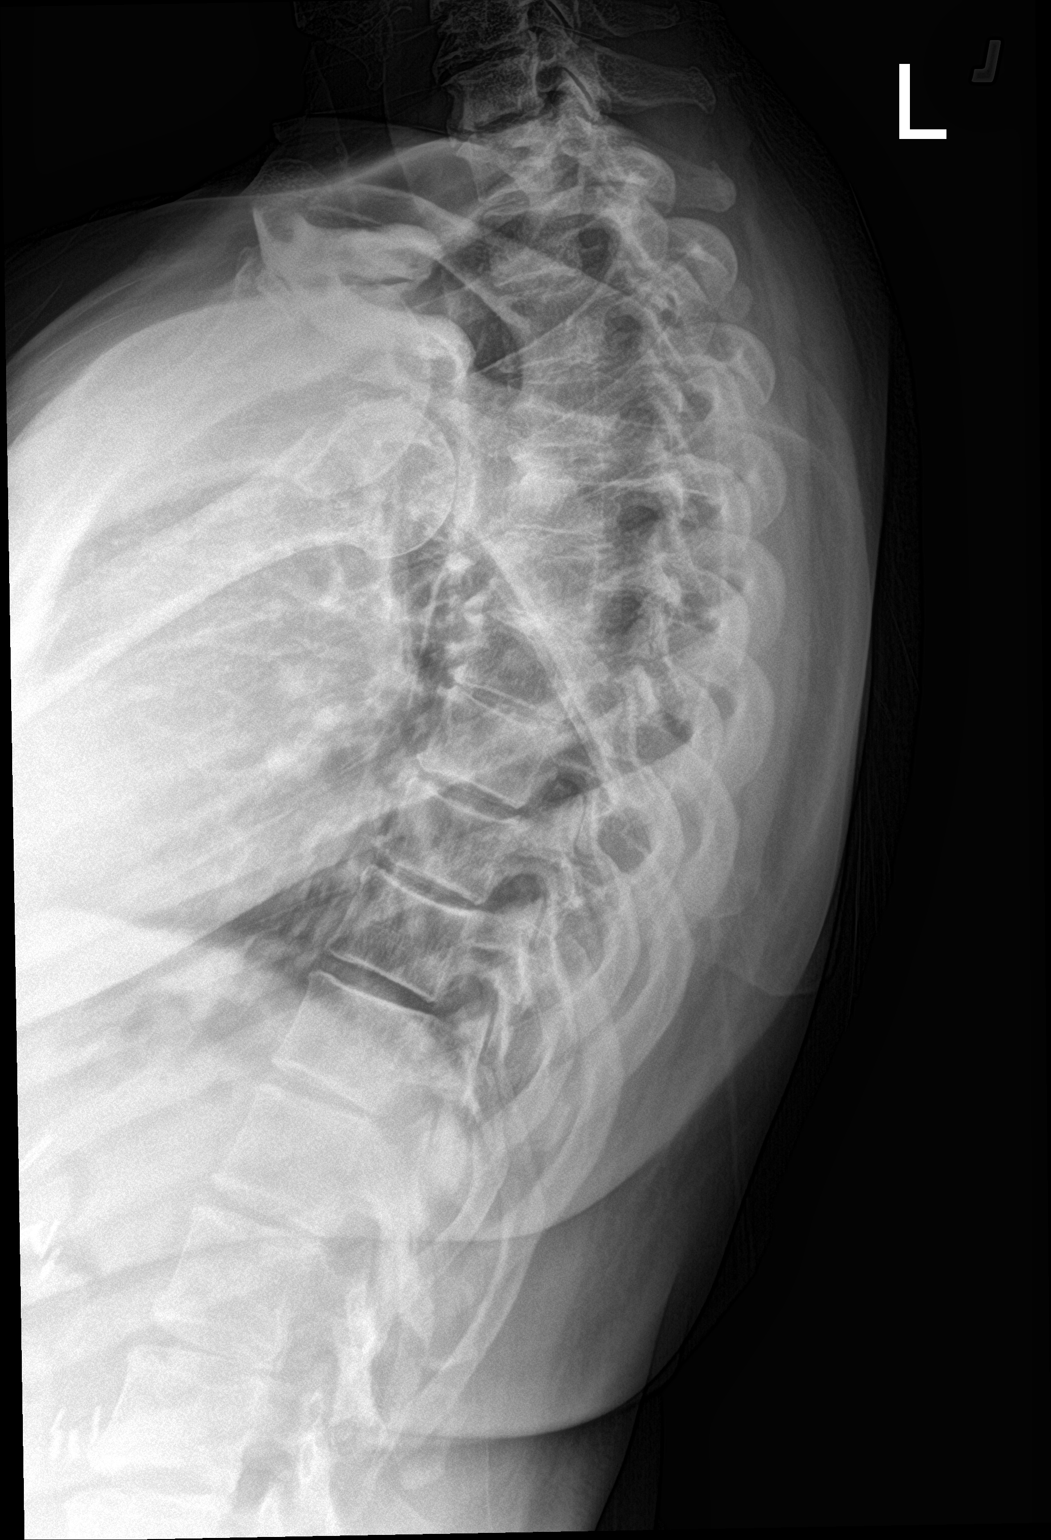

[t-spine swimmers]
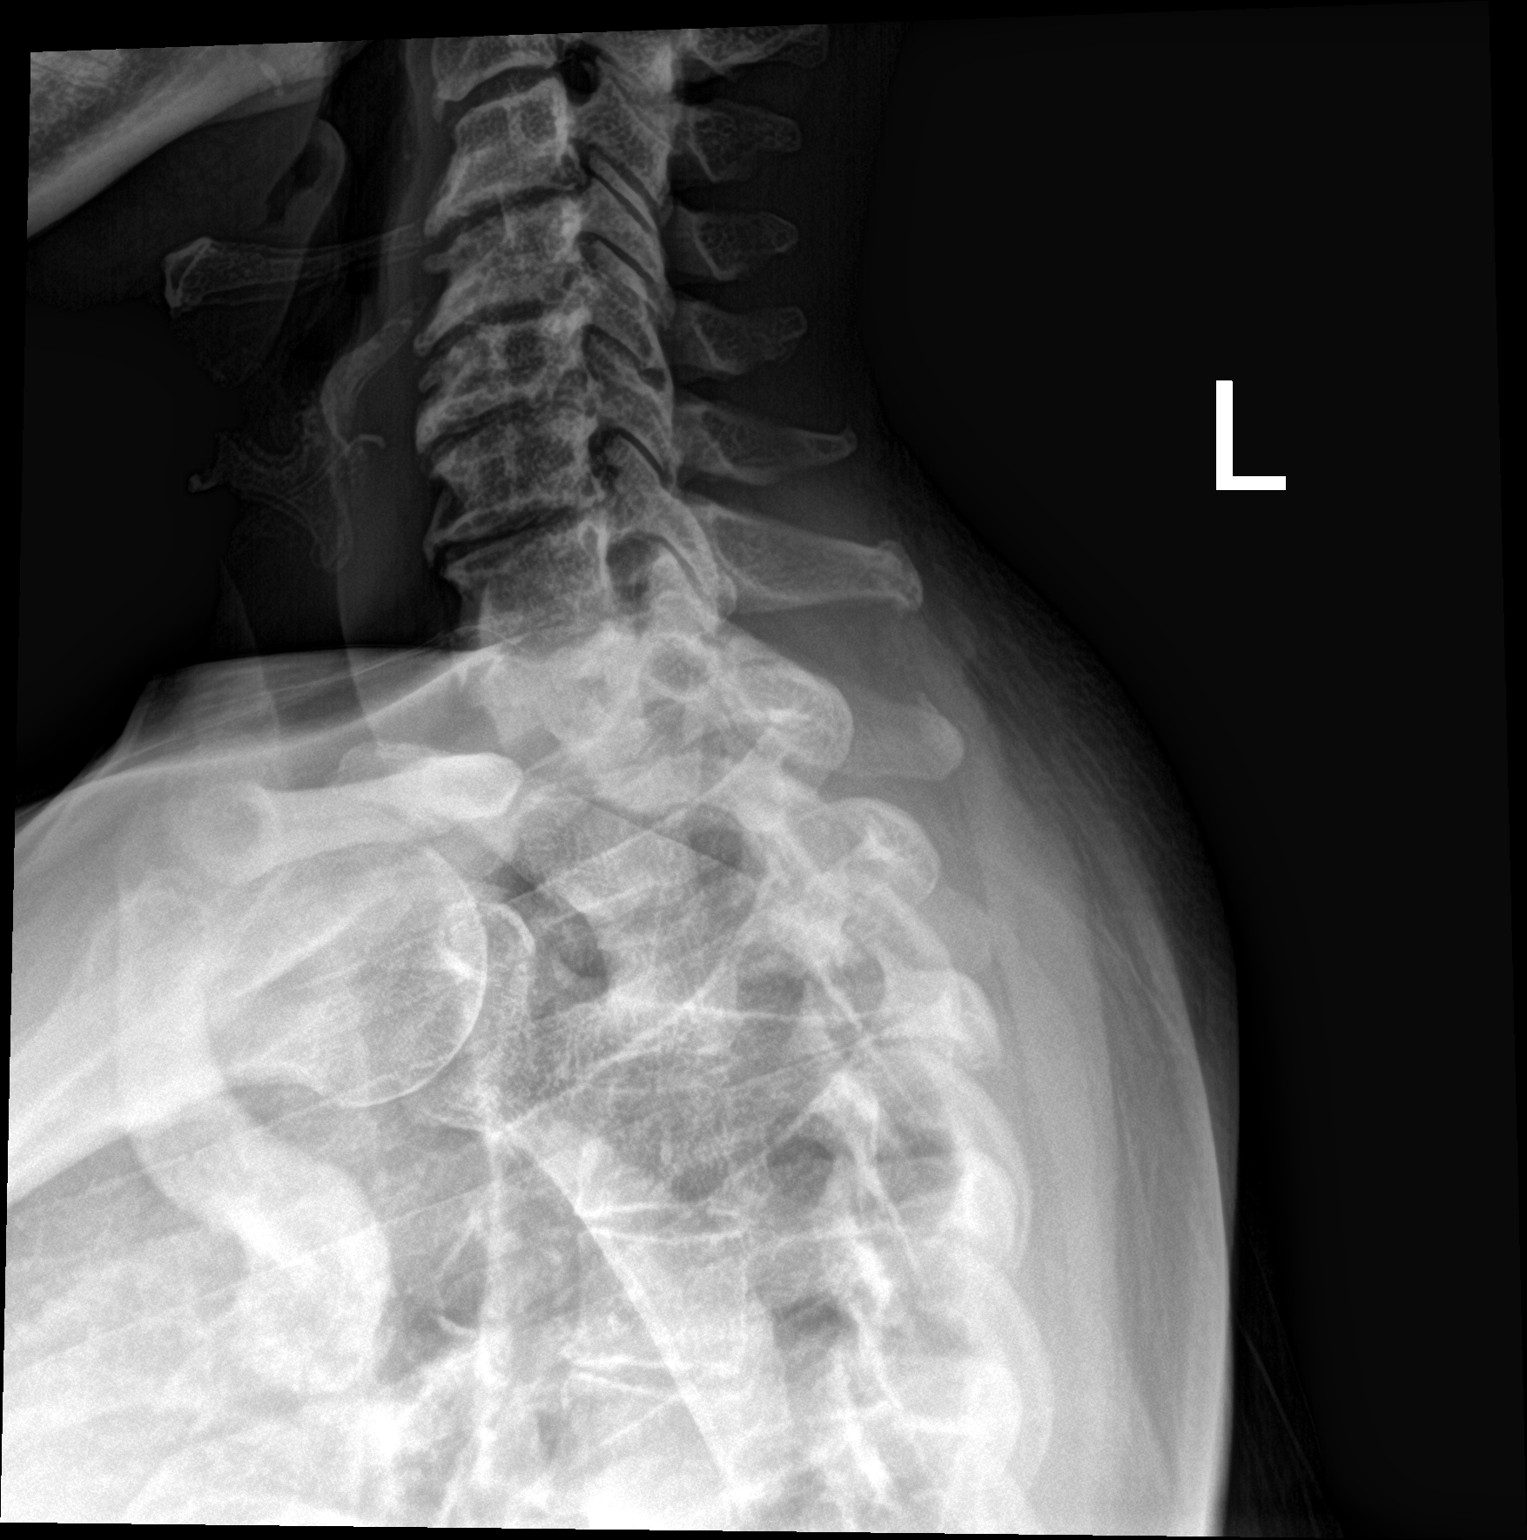

[3 of 3 positions shown; findings below may reference images not displayed]

FINDINGS: There is no evidence of thoracic spine fracture. Alignment is
normal. Very mild convex right curvature noted. No other significant
bone abnormalities are identified.
IMPRESSION: Very mild convex right curvature.  Otherwise negative.

## 2020-11-05 IMAGING — DX DG LUMBAR SPINE COMPLETE 4+V
5 series · 5 of 5 positions shown · non-contrast
Comparison: None.

CLINICAL DATA: Low back pain, intermittent

EXAM:
LUMBAR SPINE - COMPLETE 4+ VIEW

[l-spine ap]
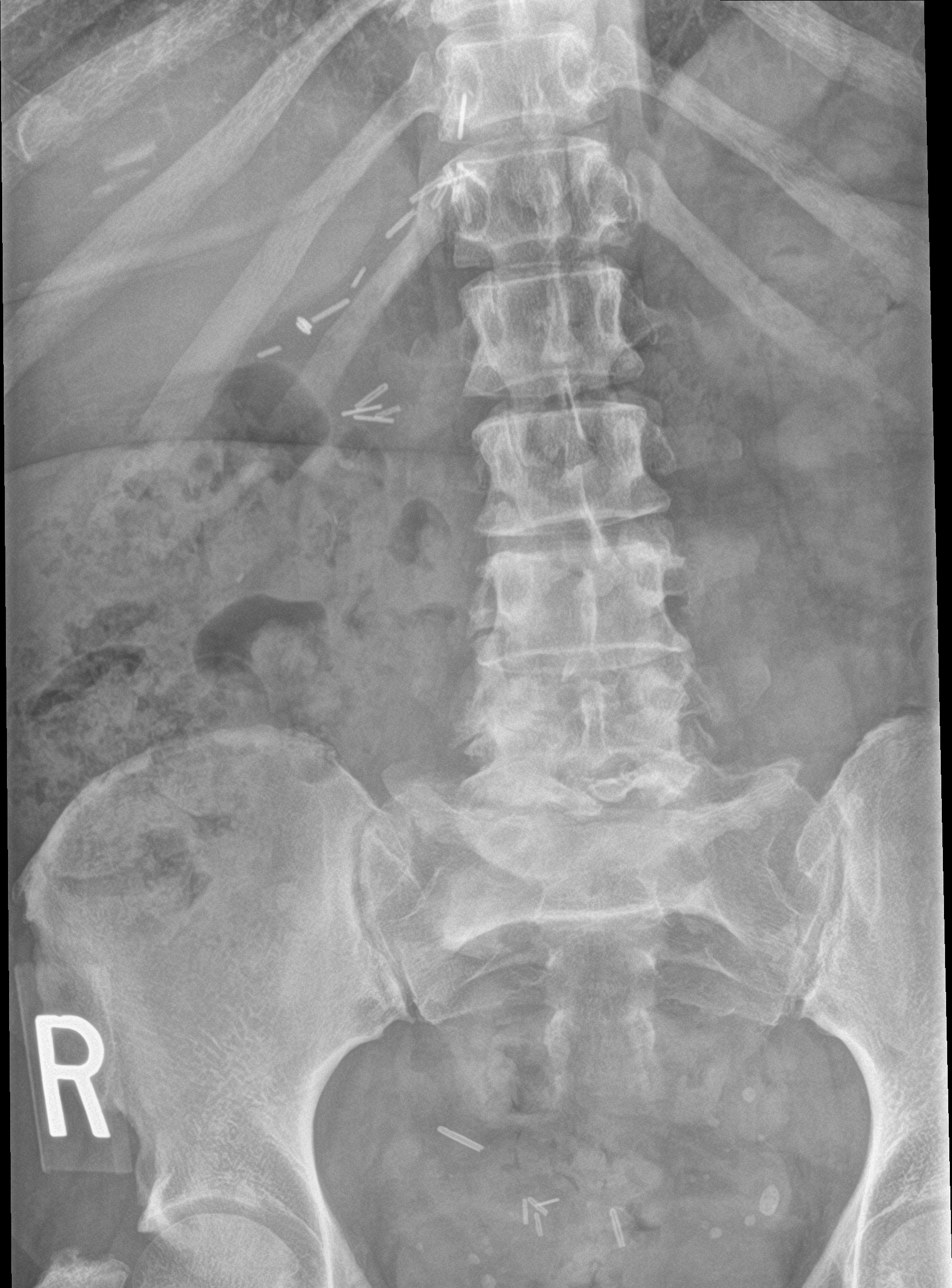

[l-spine obl (1 of 2)]
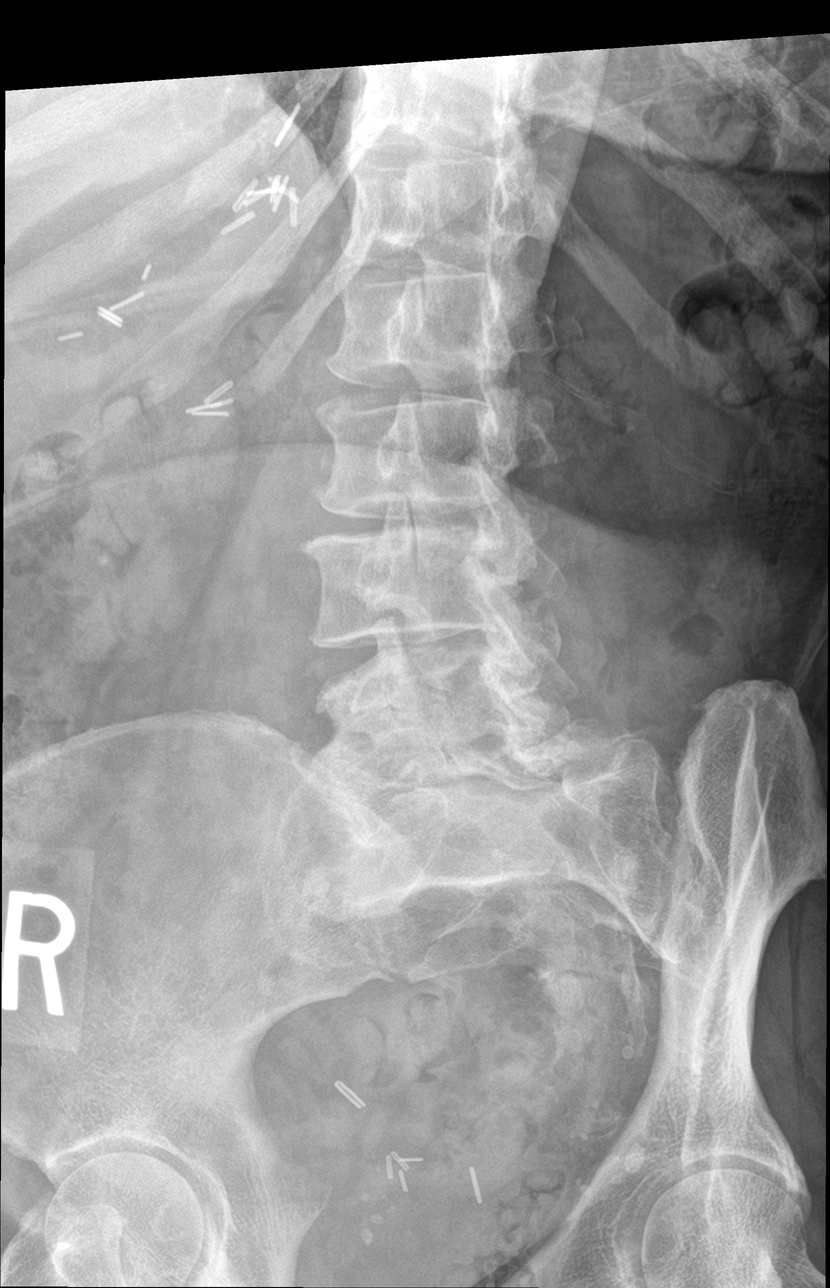

[l-spine obl (2 of 2)]
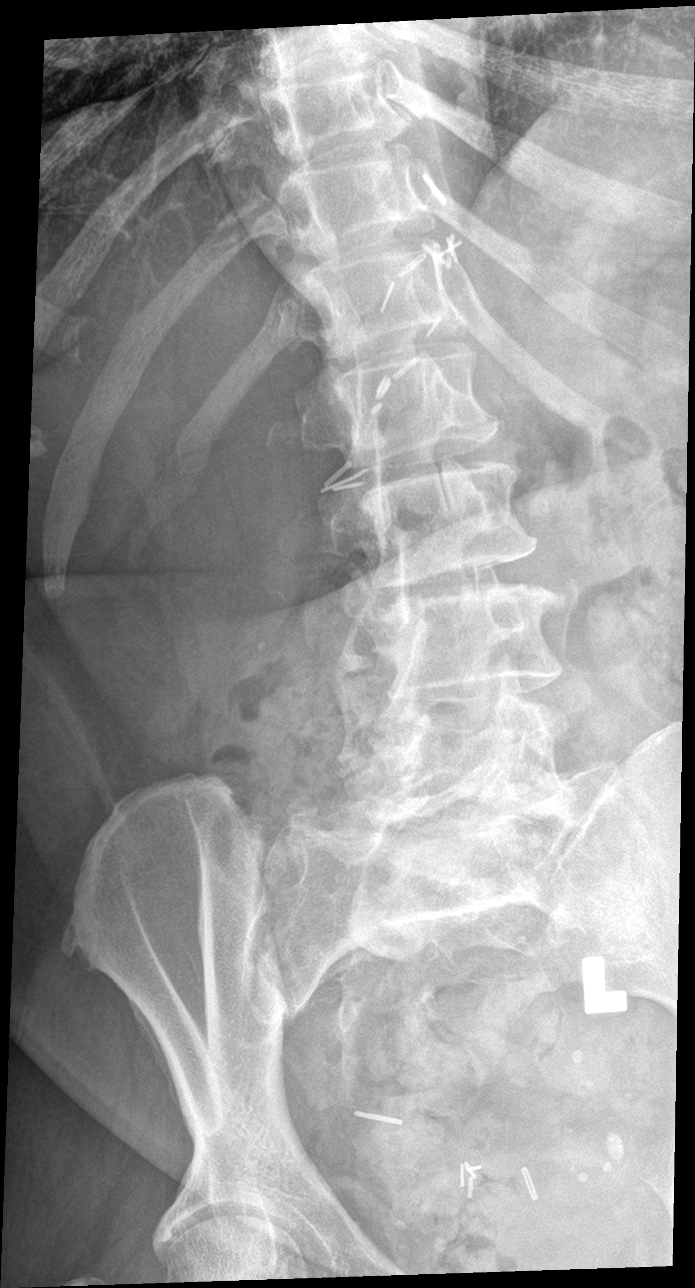

[l-spine lat]
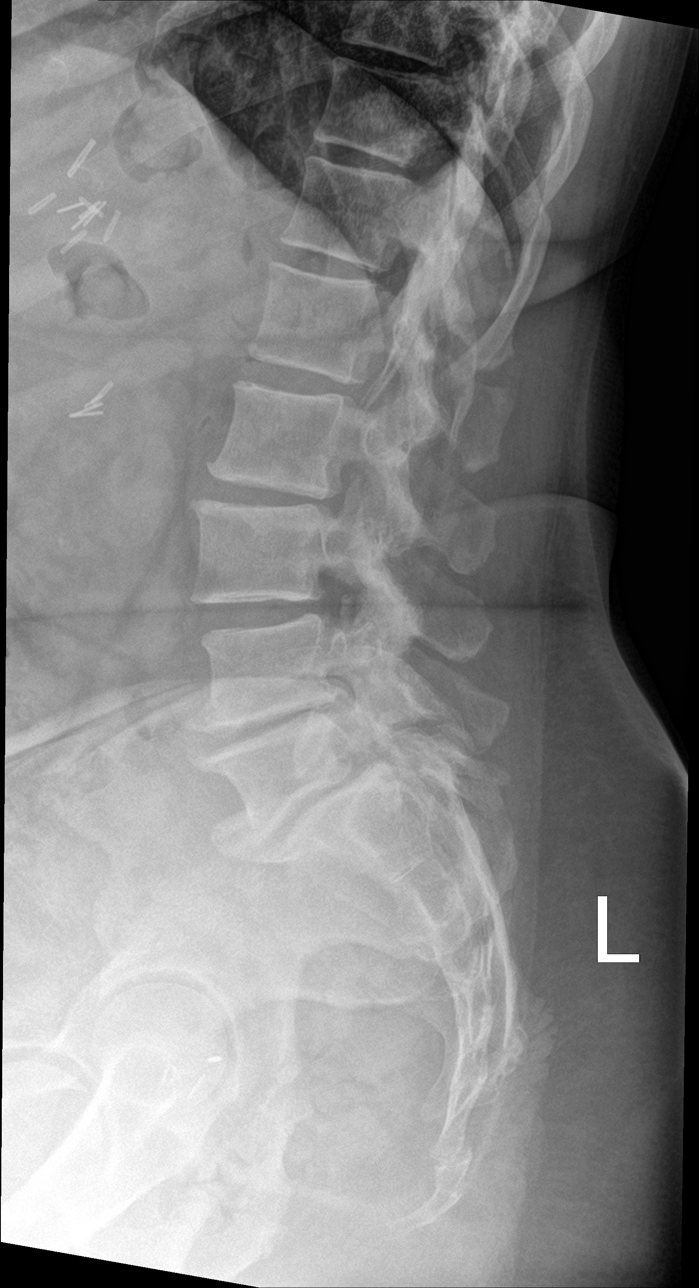

[l-spine spot]
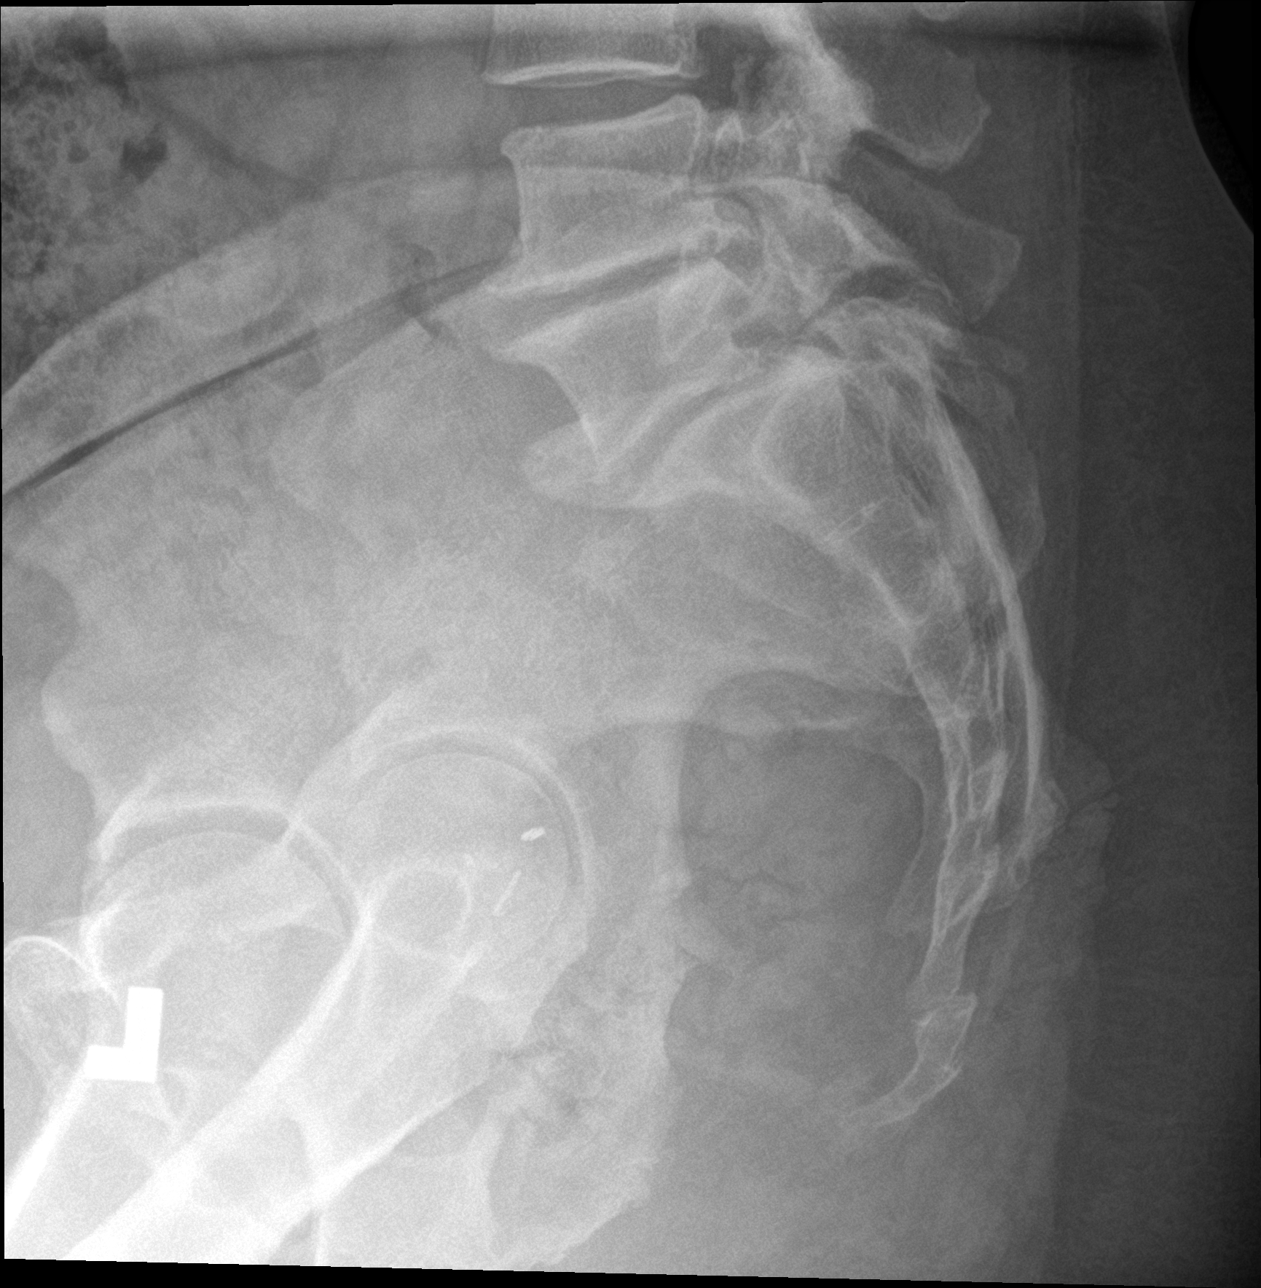

[5 of 5 positions shown; findings below may reference images not displayed]

FINDINGS: There is approximately 1.1 cm anterolisthesis of L5 on S1. 3 mm
retrolisthesis of L4 on L5 is noted. Bilateral L5 pars defects.
Moderate disc space narrowing and endplate spurring is noted at L4-5
and L5-S1. No acute fractures.
IMPRESSION: 1. Bilateral L5 pars defects with 1.1 cm anterolisthesis of L5 on
S1.
2. 3 mm retrolisthesis of L4 on L5.
3. Moderate degenerative disc disease at L4-5 and L5-S1.

## 2020-11-05 MED ORDER — CYCLOBENZAPRINE HCL 10 MG PO TABS: 10.0000 mg | ORAL_TABLET | Freq: Three times a day (TID) | ORAL | 0 refills | Status: DC | PRN

## 2020-11-05 MED ORDER — OLMESARTAN-AMLODIPINE-HCTZ 20-5-12.5 MG PO TABS: 1.0000 | ORAL_TABLET | Freq: Every day | ORAL | 1 refills | Status: DC

## 2020-11-05 MED ORDER — METOPROLOL SUCCINATE ER 100 MG PO TB24: 100.0000 mg | ORAL_TABLET | Freq: Every day | ORAL | 3 refills | Status: DC

## 2020-11-05 MED ORDER — METFORMIN HCL ER 750 MG PO TB24: 750.0000 mg | ORAL_TABLET | Freq: Every day | ORAL | 0 refills | Status: DC

## 2020-11-05 MED ORDER — ZOLPIDEM TARTRATE 10 MG PO TABS: 10.0000 mg | ORAL_TABLET | Freq: Every day | ORAL | 1 refills | Status: DC

## 2020-11-05 NOTE — Patient Instructions (Signed)

## 2020-11-05 NOTE — Progress Notes (Signed)
Subjective:    Patient ID: Bonnie Torres, female    DOB: 11-20-1955, 65 y.o.   MRN: [id number]  HPI  Patient is a 65 year old female with type 2 diabetes, hypertension, hyperlipidemia, history of bilateral renal carcinoma who presents to the clinic for medication refill and to discuss mid and low back pain.  DM- last A1C was 6.0. not checking sugars at home. No open wounds. No hypoglycemic events. Taking metformin.   HTN- taking medications not checking BP at home. No CP, palpitations, headaches or vision changes.   Insomnia- controlled with ambien.   Pt is having some mid back and low back pain that is worsening. Denies any urinary symptoms, fever, chills. Seems to radiate down her left side and into her left thigh. She feels really tight on that side. No bowel or bladder dysfunction, saddle anesthesia, weakness of extremities.   More frequent nosebleeds lately. Not done anything to make better.   .. Active Ambulatory Problems    Diagnosis Date Noted  . Insomnia 08/31/2014  . Essential hypertension, benign 08/31/2014  . Hyperlipidemia 09/28/2014  . Bilateral low back pain without sciatica 04/17/2015  . Primary gout 10/16/2015  . Breast calcification, right 11/13/2015  . Elevated testosterone level in female 03/25/2016  . Mass of kidney 04/05/2016  . Uterine mass 04/27/2016  . Lymphangioma 06/18/2016  . Hot flashes due to surgical menopause 08/19/2016  . Obesity (BMI 30.0-34.9) 09/16/2016  . Hypokalemia 04/02/2017  . Anterior cervical adenopathy 05/19/2017  . Trigger middle finger of left hand 12/01/2017  . Type 2 diabetes mellitus without complication, without long-term current use of insulin ([place]) 12/03/2017  . Class 1 obesity due to excess calories without serious comorbidity with body mass index (BMI) of 33.0 to 33.9 in adult 12/03/2017  . Elevated serum creatinine 12/07/2017  . Irregular heart rate 04/06/2018  . Hypotension 04/06/2018  . Mid back pain on left side  07/29/2018  . Personal history of gout 01/25/2019  . Acute gout of right foot 01/25/2019  . Elevated uric acid in blood 01/25/2019  . Cubital tunnel syndrome, left 11/20/2019  . Neoplasm of left kidney 11/30/2019  . Bilateral plantar fasciitis 01/29/2020  . neoplasm left kidney 01/29/2020  . Hx of partial nephrectomy 01/29/2020  . Chronic kidney disease (CKD), stage III (moderate) ([place]) 03/12/2020   Resolved Ambulatory Problems    Diagnosis Date Noted  . Mass of omentum 04/05/2016  . Hirsutism 04/05/2016  . Mass of ovary 04/05/2016  . Renal cell carcinoma ([place]) 08/18/2016   Past Medical History:  Diagnosis Date  . Complication of anesthesia   . Gout   . Heart rate fast   . Hypertension   . PONV (postoperative nausea and vomiting)   . 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     Objective:   Physical Exam Vitals reviewed.  Constitutional:      Appearance: Normal appearance. She is obese.  HENT:     Head: Normocephalic.  Neck:     Vascular: No carotid bruit.  Cardiovascular:     Rate and Rhythm: Normal rate and regular rhythm.     Pulses: Normal pulses.     Heart sounds: Normal heart sounds.  Pulmonary:     Effort: Pulmonary effort is normal.     Breath sounds: Normal breath sounds.  Abdominal:     General: There is no distension.     Palpations: There is no mass.     Tenderness: There is  no abdominal tenderness. There is no right CVA tenderness, left CVA tenderness, guarding or rebound.  Musculoskeletal:     Right lower leg: No edema.     Left lower leg: No edema.     Comments: Discomfort with bending at waist.  No tenderness over thoracic or lumbar spine to palpation.  Tight paraspinal muscles.  Lower extremity strength 5/5.  Negative SLR.    Neurological:     General: No focal deficit present.     Mental Status: She is alert and oriented to person, place, and time.  Psychiatric:        Mood and Affect: Mood normal.      Results for orders placed or performed in visit on 11/05/20  Cologuard  Result Value Ref Range   Cologuard Negative Negative  COMPLETE METABOLIC PANEL WITH GFR  Result Value Ref Range   Glucose, Bld 84 65 - 99 mg/dL   BUN 20 7 - 25 mg/dL   Creat 1.35 (H) 0.50 - 0.99 mg/dL   GFR, Est Non African American 42 (L) > OR = 60 mL/min/1.38m2   GFR, Est African American 48 (L) > OR = 60 mL/min/1.60m2   BUN/Creatinine Ratio 15 6 - 22 (calc)   Sodium 138 135 - 146 mmol/L   Potassium 4.7 3.5 - 5.3 mmol/L   Chloride 103 98 - 110 mmol/L   CO2 27 20 - 32 mmol/L   Calcium 10.1 8.6 - 10.4 mg/dL   Total Protein 7.3 6.1 - 8.1 g/dL   Albumin 4.4 3.6 - 5.1 g/dL   Globulin 2.9 1.9 - 3.7 g/dL (calc)   AG Ratio 1.5 1.0 - 2.5 (calc)   Total Bilirubin 0.4 0.2 - 1.2 mg/dL   Alkaline phosphatase (APISO) 83 37 - 153 U/L   AST 15 10 - 35 U/L   ALT 11 6 - 29 U/L  CBC with Differential/Platelet  Result Value Ref Range   WBC 5.8 3.8 - 10.8 Thousand/uL   RBC 4.80 3.80 - 5.10 Million/uL   Hemoglobin 12.3 11.7 - 15.5 g/dL   HCT 37.6 35.0 - 45.0 %   MCV 78.3 (L) 80.0 - 100.0 fL   MCH 25.6 (L) 27.0 - 33.0 pg   MCHC 32.7 32.0 - 36.0 g/dL   RDW 14.4 11.0 - 15.0 %   Platelets 324 140 - 400 Thousand/uL   MPV 9.9 7.5 - 12.5 fL   Neutro Abs 1,856 1,500 - 7,800 cells/uL   Lymphs Abs 3,335 850 - 3,900 cells/uL   Absolute Monocytes 389 200 - 950 cells/uL   Eosinophils Absolute 180 15 - 500 cells/uL   Basophils Absolute 41 0 - 200 cells/uL   Neutrophils Relative % 32 %   Total Lymphocyte 57.5 %   Monocytes Relative 6.7 %   Eosinophils Relative 3.1 %   Basophils Relative 0.7 %  POCT glycosylated hemoglobin (Hb A1C)  Result Value Ref Range   Hemoglobin A1C 6.0 (A) 4.0 - 5.6 %   HbA1c POC (<> result, manual entry)     HbA1c, POC (prediabetic range)     HbA1c, POC (controlled diabetic range)         Assessment & Plan:  Marland KitchenMarland KitchenFaith was seen today for back pain.  Diagnoses and all orders for this  visit:  Type 2 diabetes mellitus without complication, without long-term current use of insulin (HCC) -     POCT glycosylated hemoglobin (Hb A1C) -     Discontinue: metFORMIN (GLUCOPHAGE-XR) 750 MG 24 hr tablet; Take 1  tablet (750 mg total) by mouth daily with breakfast. -     metFORMIN (GLUCOPHAGE-XR) 750 MG 24 hr tablet; Take 1 tablet (750 mg total) by mouth daily with breakfast.  Frequent nosebleeds  Stage 3b chronic kidney disease (HCC) -     COMPLETE METABOLIC PANEL WITH GFR  Essential hypertension, benign -     CBC with Differential/Platelet -     Discontinue: Olmesartan-amLODIPine-HCTZ 20-5-12.5 MG TABS; Take 1 tablet by mouth daily. -     Discontinue: metoprolol succinate (TOPROL-XL) 100 MG 24 hr tablet; Take 1 tablet (100 mg total) by mouth daily. appt for refills -     metoprolol succinate (TOPROL-XL) 100 MG 24 hr tablet; Take 1 tablet (100 mg total) by mouth daily. appt for refills -     Olmesartan-amLODIPine-HCTZ 20-5-12.5 MG TABS; Take 1 tablet by mouth daily.  Primary insomnia -     CBC with Differential/Platelet -     zolpidem (AMBIEN) 10 MG tablet; Take 1 tablet (10 mg total) by mouth at bedtime.  Hypokalemia  Mid back pain -     DG Thoracic Spine W/Swimmers; Future -     cyclobenzaprine (FLEXERIL) 10 MG tablet; Take 1 tablet (10 mg total) by mouth 3 (three) times daily as needed for muscle spasms.  Chronic bilateral low back pain with left-sided sciatica -     DG Lumbar Spine Complete; Future -     cyclobenzaprine (FLEXERIL) 10 MG tablet; Take 1 tablet (10 mg total) by mouth 3 (three) times daily as needed for muscle spasms.  Colon cancer screening  Other orders -     Discontinue: cyclobenzaprine (FLEXERIL) 10 MG tablet; Take 1 tablet (10 mg total) by mouth 3 (three) times daily as needed for muscle spasms.   A1C to goal.  Continue metformin.  BP to goal. On ARB. On STATIN.  UTD eye and foot exam.  Covid/flu/pneumonia vaccine UTD.   Needs cologuard.    Xray ordered for mid and low back to evaluate.  Given stretches to start.  Flexeril refilled.  Avoid NSAIDS due to CKD.  Ok to use tylenol, icy hot patches, tens unit.  Follow up as needed.

## 2020-11-06 ENCOUNTER — Encounter: Payer: Self-pay | Admitting: Physician Assistant

## 2020-11-06 LAB — CBC WITH DIFFERENTIAL/PLATELET
Absolute Monocytes: 389 cells/uL (ref 200–950)
Basophils Absolute: 41 cells/uL (ref 0–200)
Basophils Relative: 0.7 %
Eosinophils Absolute: 180 cells/uL (ref 15–500)
Eosinophils Relative: 3.1 %
HCT: 37.6 % (ref 35.0–45.0)
Hemoglobin: 12.3 g/dL (ref 11.7–15.5)
Lymphs Abs: 3335 cells/uL (ref 850–3900)
MCH: 25.6 pg — ABNORMAL LOW (ref 27.0–33.0)
MCHC: 32.7 g/dL (ref 32.0–36.0)
MCV: 78.3 fL — ABNORMAL LOW (ref 80.0–100.0)
MPV: 9.9 fL (ref 7.5–12.5)
Monocytes Relative: 6.7 %
Neutro Abs: 1856 cells/uL (ref 1500–7800)
Neutrophils Relative %: 32 %
Platelets: 324 10*3/uL (ref 140–400)
RBC: 4.8 10*6/uL (ref 3.80–5.10)
RDW: 14.4 % (ref 11.0–15.0)
Total Lymphocyte: 57.5 %
WBC: 5.8 10*3/uL (ref 3.8–10.8)

## 2020-11-06 LAB — COMPLETE METABOLIC PANEL WITH GFR
AG Ratio: 1.5 (calc) (ref 1.0–2.5)
ALT: 11 U/L (ref 6–29)
AST: 15 U/L (ref 10–35)
Albumin: 4.4 g/dL (ref 3.6–5.1)
Alkaline phosphatase (APISO): 83 U/L (ref 37–153)
BUN/Creatinine Ratio: 15 (calc) (ref 6–22)
BUN: 20 mg/dL (ref 7–25)
CO2: 27 mmol/L (ref 20–32)
Calcium: 10.1 mg/dL (ref 8.6–10.4)
Chloride: 103 mmol/L (ref 98–110)
Creat: 1.35 mg/dL — ABNORMAL HIGH (ref 0.50–0.99)
GFR, Est African American: 48 mL/min/{1.73_m2} — ABNORMAL LOW (ref >=60)
GFR, Est Non African American: 42 mL/min/{1.73_m2} — ABNORMAL LOW (ref >=60)
Globulin: 2.9 g/dL (calc) (ref 1.9–3.7)
Glucose, Bld: 84 mg/dL (ref 65–99)
Potassium: 4.7 mmol/L (ref 3.5–5.3)
Sodium: 138 mmol/L (ref 135–146)
Total Bilirubin: 0.4 mg/dL (ref 0.2–1.2)
Total Protein: 7.3 g/dL (ref 6.1–8.1)

## 2020-11-06 MED ORDER — CYCLOBENZAPRINE HCL 10 MG PO TABS: 10.0000 mg | ORAL_TABLET | Freq: Three times a day (TID) | ORAL | 0 refills | Status: DC | PRN

## 2020-11-06 MED ORDER — METFORMIN HCL ER 750 MG PO TB24: 750.0000 mg | ORAL_TABLET | Freq: Every day | ORAL | 0 refills | Status: DC

## 2020-11-06 MED ORDER — METOPROLOL SUCCINATE ER 100 MG PO TB24: 100.0000 mg | ORAL_TABLET | Freq: Every day | ORAL | 3 refills | Status: DC

## 2020-11-06 MED ORDER — OLMESARTAN-AMLODIPINE-HCTZ 20-5-12.5 MG PO TABS: 1.0000 | ORAL_TABLET | Freq: Every day | ORAL | 1 refills | Status: DC

## 2020-11-06 NOTE — Progress Notes (Signed)
Patient called back after visit and states medication refills were to be sent to the mail order pharmacy. Medications resent.

## 2020-11-12 ENCOUNTER — Encounter: Payer: Self-pay | Admitting: Physician Assistant

## 2020-11-12 DIAGNOSIS — M5136 Other intervertebral disc degeneration, lumbar region: Secondary | ICD-10-CM | POA: Insufficient documentation

## 2020-11-12 DIAGNOSIS — M51369 Other intervertebral disc degeneration, lumbar region without mention of lumbar back pain or lower extremity pain: Secondary | ICD-10-CM | POA: Insufficient documentation

## 2020-11-12 MED ORDER — POTASSIUM CHLORIDE CRYS ER 20 MEQ PO TBCR: 20.0000 meq | EXTENDED_RELEASE_TABLET | Freq: Once | ORAL | 3 refills | Status: DC

## 2020-11-12 NOTE — Progress Notes (Signed)
Bonnie Torres,   Kidney function has decreased some. I would like for you to see nephrology sooner than your next appt.

## 2020-11-12 NOTE — Progress Notes (Signed)
Orders placed.

## 2020-11-12 NOTE — Progress Notes (Signed)
Bonnie Torres,   Lower spine does some degeneration(arthritis) more at L4 through S1. You do have a 1.1cm slip at L5 on S1 and 3mm on L4 to L5. This could be causing your low back pain that radiates into your leg. Formal physical therapy at times can help with this and if persistent then injections. Would you like to start PT?   You mid back can not be explained. If you do not have an upcoming image of kidney scheduled I would like to do that to make sure nothing has progressed or changed with your hx of renal carcinoma.

## 2020-11-12 NOTE — Progress Notes (Signed)
Thoracic xray alignment is normal. No significant bone abnormalities. Slight curvature to spine.

## 2020-11-21 ENCOUNTER — Telehealth: Payer: Self-pay | Admitting: Neurology

## 2020-11-21 DIAGNOSIS — Z85528 Personal history of other malignant neoplasm of kidney: Secondary | ICD-10-CM

## 2020-11-21 DIAGNOSIS — N1832 Chronic kidney disease, stage 3b: Secondary | ICD-10-CM

## 2020-11-21 DIAGNOSIS — M549 Dorsalgia, unspecified: Secondary | ICD-10-CM

## 2020-11-21 NOTE — Telephone Encounter (Signed)
Patient left vm stating kidney doctor didn't want to see her sooner than scheduled appt, just wanted imaging results (MR) sent to him. She states she hasn't heard from scheduling about MR. I don't see one ordered. Did you want to order MR of kidneys?   See result notes:   Bonnie Stade, PA-C  11/12/2020 4:50 PM EDT      Orders placed.    Annamaria Helling, [place]  11/12/2020 11:41 AM EDT      Spoke with patient and she is okay with physical therapy referral if covered by insurance and okay with imaging. She will call to move up nephrology visit. Please place orders.    Annamaria Helling, [place]  11/12/2020 10:00 AM EDT      Called patient to see if okay with [place] ordering imaging on kidney. LMOM for her to call back.    Annamaria Helling, [place]  11/12/2020 8:23 AM EDT      Seen by patient Bonnie Torres on 11/12/2020 8:04 AM   Bonnie Stade, PA-C  11/12/2020 7:45 AM EDT      Leith,   Lower spine does some degeneration(arthritis) more at L4 through S1. You do have a 1.1cm slip at L5 on S1 and 32mm on L4 to L5. This could be causing your low back pain that radiates into your leg. Formal physical therapy at times can help with this and if persistent then injections. Would you like to start PT?   You mid back can not be explained. If you do not have an upcoming image of kidney scheduled I would like to do that to make sure nothing has progressed or changed with your hx of renal carcinoma.

## 2020-11-21 NOTE — Telephone Encounter (Signed)
JJ, this would be a MR abdomen with and without contrast. Can we call and see if this would qualify to use contrast during the shortage. Hx of left and right renal carcinoma with new persistent mid back pain.

## 2020-11-21 NOTE — Telephone Encounter (Signed)
Order placed for MR.

## 2020-12-09 ENCOUNTER — Other Ambulatory Visit: Payer: Self-pay

## 2020-12-09 ENCOUNTER — Ambulatory Visit: Payer: Medicare Other

## 2020-12-09 DIAGNOSIS — C641 Malignant neoplasm of right kidney, except renal pelvis: Secondary | ICD-10-CM | POA: Diagnosis not present

## 2020-12-09 DIAGNOSIS — Z85528 Personal history of other malignant neoplasm of kidney: Secondary | ICD-10-CM

## 2020-12-09 DIAGNOSIS — N281 Cyst of kidney, acquired: Secondary | ICD-10-CM | POA: Diagnosis not present

## 2020-12-09 DIAGNOSIS — C642 Malignant neoplasm of left kidney, except renal pelvis: Secondary | ICD-10-CM | POA: Diagnosis not present

## 2020-12-09 DIAGNOSIS — M549 Dorsalgia, unspecified: Secondary | ICD-10-CM

## 2020-12-09 DIAGNOSIS — Z9889 Other specified postprocedural states: Secondary | ICD-10-CM | POA: Diagnosis not present

## 2020-12-09 DIAGNOSIS — N1832 Chronic kidney disease, stage 3b: Secondary | ICD-10-CM

## 2020-12-09 IMAGING — MR MR ABDOMEN WO/W CM
17 of 19 series · 42 of 48 positions shown · IV contrast (gadavist)
Comparison: [DATE]

CLINICAL DATA: Follow-up bilateral renal cell carcinomas. Previous
bilateral partial nephrectomies.

EXAM:
MRI ABDOMEN WITHOUT AND WITH CONTRAST
TECHNIQUE: Multiplanar multisequence MR imaging of the abdomen was performed
both before and after the administration of intravenous contrast.
CONTRAST:  7mL GADAVIST GADOBUTROL 1 MMOL/ML IV SOLN

[Series 3: cor ssfse / · coronal · 6.0mm · 1.48mm/px · 2 of 32 slices shown]
[im 1/32]
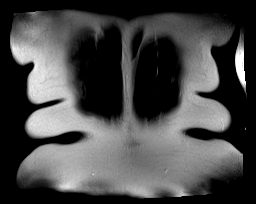
[im 32/32]
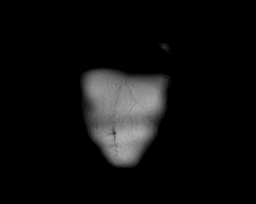

[Series 4: T2 fat-sat · axial · 6.0mm · 0.74mm/px · z∈[-59,+164]mm · 2 of 32 slices shown]
[im 1/32]
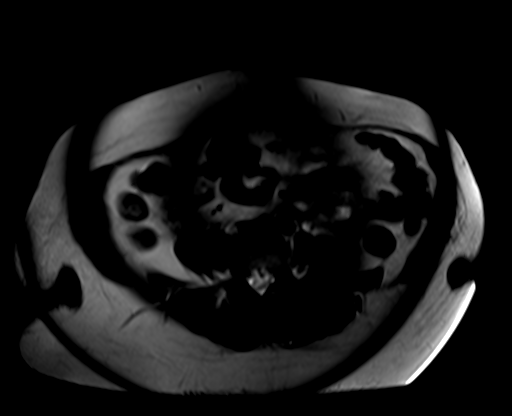
[im 32/32]
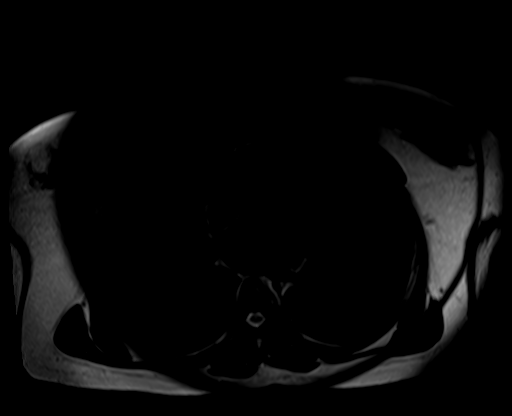

[Series 5: T1 · axial · 6.0mm · 0.74mm/px · z∈[-59,+164]mm · 3 of 64 slices shown]
[im 1/64]
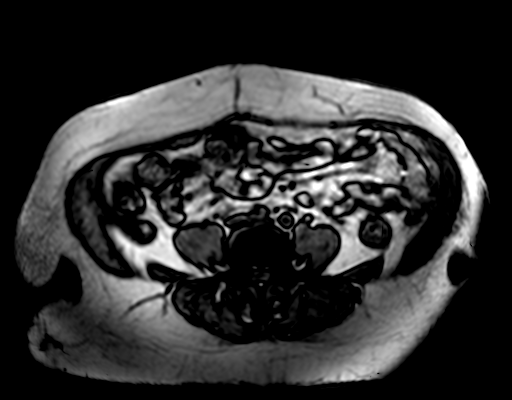
[im 32/64]
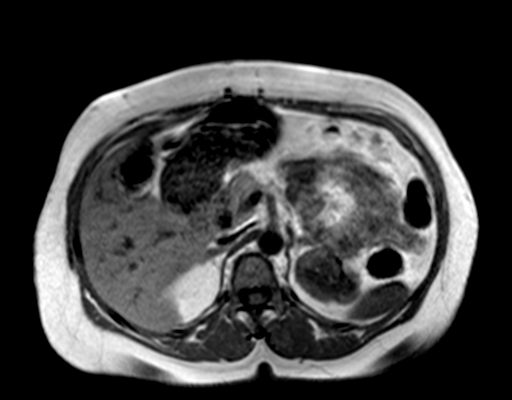
[im 64/64]
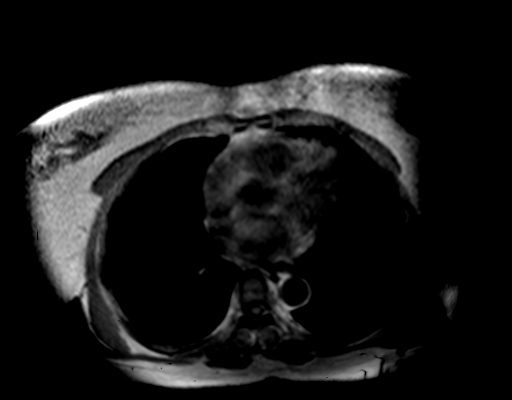

[Series 7: DWI · axial · 7.0mm · 1.98mm/px · z∈[-71,+171]mm · 4 of 90 slices shown (1 of 2)]
[im 1/90]
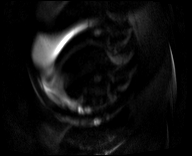
[im 30/90]
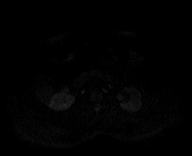
[im 60/90]
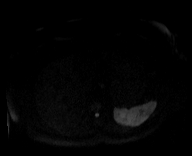
[im 90/90]
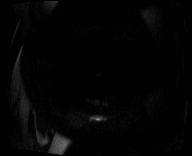

[Series 8: DWI · axial · 7.0mm · 1.98mm/px · 1 of 30 slices shown (2 of 2)]
[im 1/30]
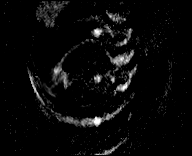

[Series 9: bSSFP · axial · 7.0mm · 0.74mm/px · 1 of 30 slices shown]
[im 1/30]
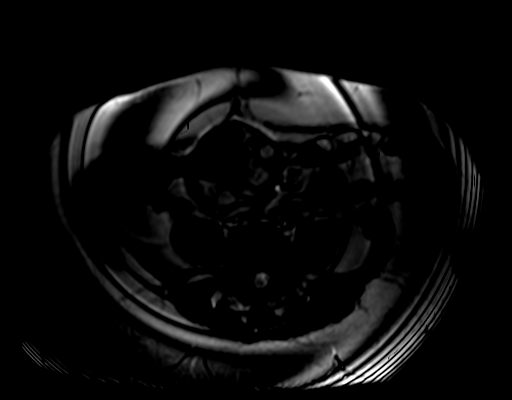

[Series 10: axial ssfse / · axial · 6.0mm · 1.19mm/px · 1 of 32 slices shown (1 of 2)]
[im 1/32]
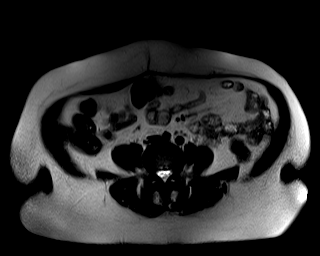

[Series 11: axial dynamic pre · axial · non-contrast · 3.5mm · 1.19mm/px · z∈[-62,+158]mm · 3 of 64 slices shown]
[im 1/64]
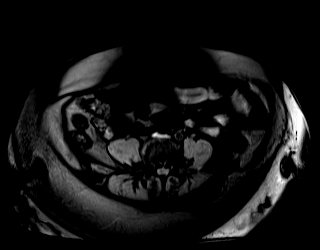
[im 32/64]
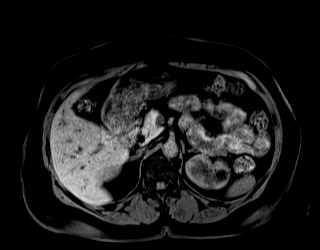
[im 64/64]
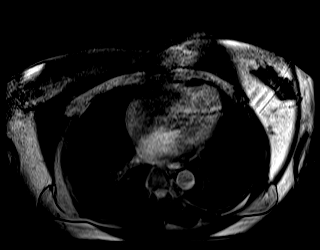

[Series 12: axial ssfse / · axial · 6.0mm · 1.19mm/px · 1 of 32 slices shown (2 of 2)]
[im 1/32]
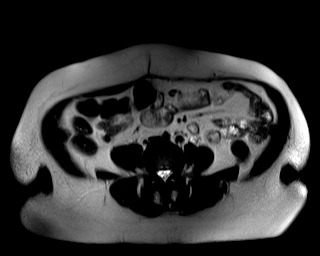

[Series 13: axial dynamic post · axial · 3.5mm · 1.19mm/px · z∈[-62,+158]mm · 3 of 64 slices shown (1 of 6)]
[im 1/64]
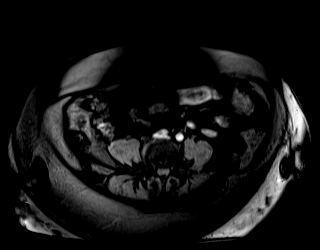
[im 32/64]
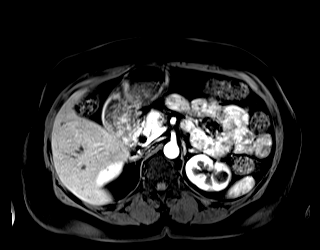
[im 64/64]
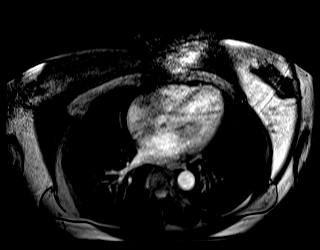

[Series 14: axial dynamic post · axial · 3.5mm · 1.19mm/px · z∈[-62,+158]mm · 3 of 64 slices shown (2 of 6)]
[im 1/64]
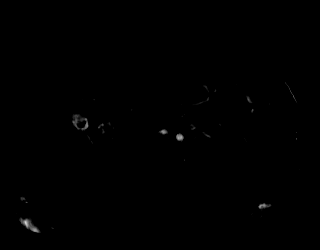
[im 32/64]
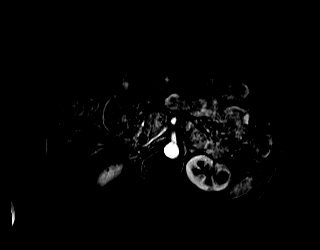
[im 64/64]
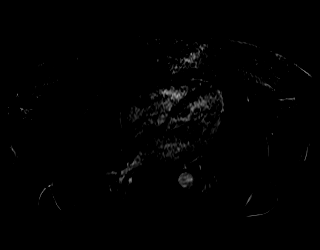

[Series 15: axial dynamic post · axial · 3.5mm · 1.19mm/px · z∈[-62,+158]mm · 3 of 64 slices shown (3 of 6)]
[im 1/64]
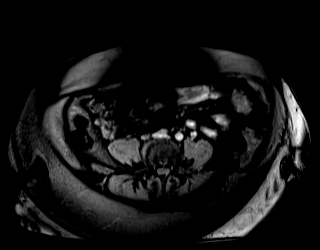
[im 32/64]
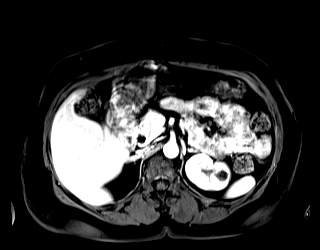
[im 64/64]
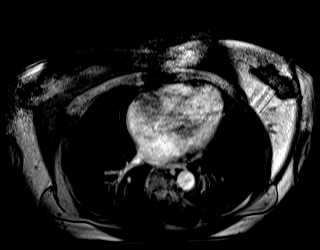

[Series 16: axial dynamic post · axial · 3.5mm · 1.19mm/px · z∈[-62,+158]mm · 3 of 64 slices shown (4 of 6)]
[im 1/64]
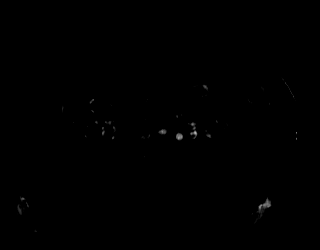
[im 32/64]
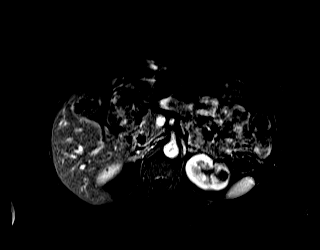
[im 64/64]
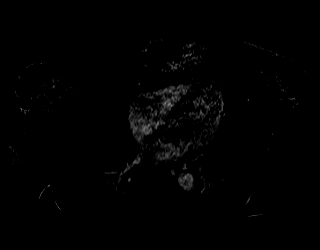

[Series 17: axial dynamic post · axial · 3.5mm · 1.19mm/px · z∈[-62,+158]mm · 3 of 64 slices shown (5 of 6)]
[im 1/64]
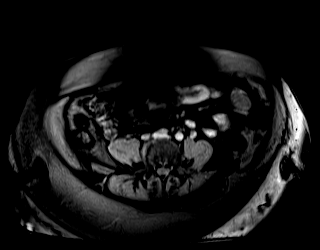
[im 32/64]
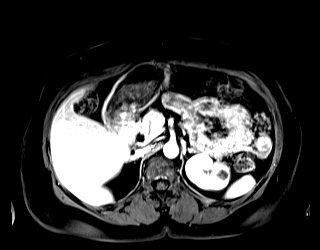
[im 64/64]
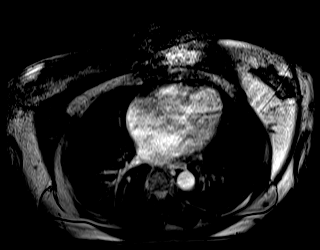

[Series 18: axial dynamic post · axial · 3.5mm · 1.19mm/px · z∈[-62,+158]mm · 3 of 64 slices shown (6 of 6)]
[im 1/64]
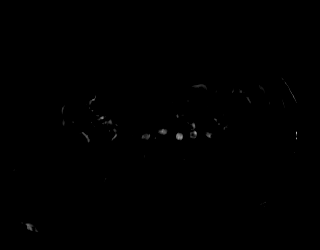
[im 32/64]
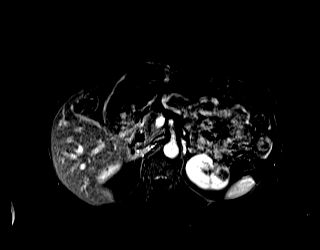
[im 64/64]
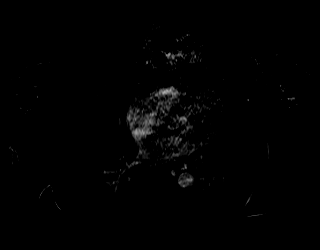

[Series 20: axial dynamic 3 · axial · 3.5mm · 1.19mm/px · z∈[-62,+158]mm · 3 of 64 slices shown (1 of 2)]
[im 1/64]
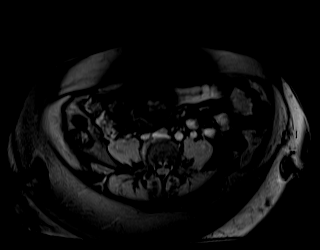
[im 32/64]
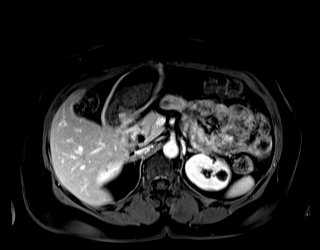
[im 64/64]
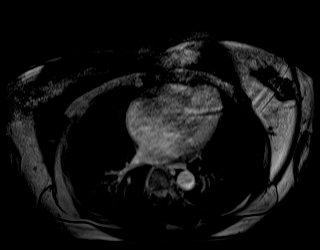

[Series 21: axial dynamic 3 · axial · 3.5mm · 1.19mm/px · z∈[-62,+158]mm · 3 of 64 slices shown (2 of 2)]
[im 1/64]
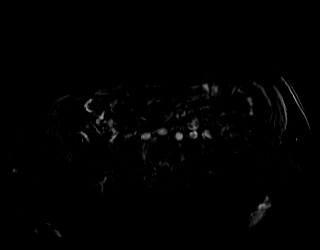
[im 32/64]
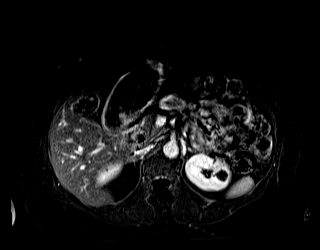
[im 64/64]
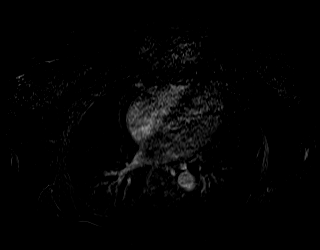

[42 of 48 positions shown; findings below may reference images not displayed]

FINDINGS: Lower chest: No acute findings.

Hepatobiliary: No hepatic masses identified. Prior cholecystectomy.
No evidence of biliary obstruction.

Pancreas:  No mass or inflammatory changes.

Spleen:  Within normal limits in size and appearance.

Adrenals/Urinary Tract: Stable postop changes from partial
nephrectomies. Several small bilateral benign Bosniak category 1 and
2 renal cysts are again seen. No solid or enhancing renal masses are
identified. No evidence of hydronephrosis.

Stomach/Bowel: Visualized portion unremarkable.

Vascular/Lymphatic: No pathologically enlarged lymph nodes
identified. No acute vascular findings.

Other:  None.

Musculoskeletal:  No suspicious bone lesions identified.
IMPRESSION: Stable benign bilateral Bosniak category 1 and 2 renal cysts. No
evidence of recurrent carcinoma or metastatic disease.

## 2020-12-09 MED ORDER — GADOBUTROL 1 MMOL/ML IV SOLN
7.0000 mL | Freq: Once | INTRAVENOUS | Status: AC | PRN
  Administered 2020-12-09: 7 mL via INTRAVENOUS

## 2020-12-10 NOTE — Progress Notes (Signed)
GREAT news. MR of abdomen shows stable renal cyst with no evidence of carcinoma or metastatic disease. Please take disc or JJ send copy of this to nephrology for patiens follow up visit.

## 2020-12-11 DIAGNOSIS — L700 Acne vulgaris: Secondary | ICD-10-CM | POA: Diagnosis not present

## 2021-01-05 ENCOUNTER — Other Ambulatory Visit: Payer: Self-pay | Admitting: Physician Assistant

## 2021-01-05 DIAGNOSIS — F5101 Primary insomnia: Secondary | ICD-10-CM

## 2021-01-06 NOTE — Telephone Encounter (Signed)
Last appt 11/05/2020 Last written 11/05/2020 #90 with 1 refill. Too early, please sign denial.

## 2021-01-08 ENCOUNTER — Telehealth: Payer: Self-pay | Admitting: *Deleted

## 2021-01-08 NOTE — Chronic Care Management (AMB) (Signed)
  Chronic Care Management   Outreach Note  01/08/2021 Name: ALESA ECHEVARRIA MRN: [id number] DOB: 1955/12/28  Renee Rival is a 65 y.o. year old female who is a primary care patient of Donella Stade, PA-C. I reached out to Renee Rival by phone today in response to a referral sent by Ms. Carmie R Leppert's PCP Donella Stade, PA-C     An unsuccessful telephone outreach was attempted today. The patient was referred to the case management team for assistance with care management and care coordination.   Follow Up Plan: A HIPAA compliant phone message was left for the patient providing contact information and requesting a return call.  If patient returns call to provider office, please advise to call Embedded Care Management Care Guide Rc Amison at [place], [place] [facility]  Direct Dial: [phone]

## 2021-01-08 NOTE — Chronic Care Management (AMB) (Signed)
  Chronic Care Management   Note  01/08/2021 Name: Bonnie Torres MRN: [id number] DOB: 08/18/1955  Bonnie Torres is a 65 y.o. year old female who is a primary care patient of Donella Stade, PA-C. I reached out to Bonnie Torres by phone today in response to a referral sent by Ms. Anabia R Siess's PCP Donella Stade, PA-C     Ms. Binz was given information about Chronic Care Management services today including:  CCM service includes personalized support from designated clinical staff supervised by her physician, including individualized plan of care and coordination with other care providers 24/7 contact phone numbers for assistance for urgent and routine care needs. Service will only be billed when office clinical staff spend 20 minutes or more in a month to coordinate care. Only one practitioner may furnish and bill the service in a calendar month. The patient may stop CCM services at any time (effective at the end of the month) by phone call to the office staff. The patient will be responsible for cost sharing (co-pay) of up to 20% of the service fee (after annual deductible is met).  Patient agreed to services and verbal consent obtained.   Follow up plan: Telephone appointment with care management team member scheduled for:01/22/2021  Julian Hy, [place], [place] [facility]  Direct Dial: [phone]

## 2021-01-13 ENCOUNTER — Other Ambulatory Visit: Payer: Self-pay | Admitting: Neurology

## 2021-01-13 DIAGNOSIS — F5101 Primary insomnia: Secondary | ICD-10-CM

## 2021-01-13 NOTE — Telephone Encounter (Signed)
Sent to [facility] in May. Patient is requesting at [employer].

## 2021-01-14 MED ORDER — ZOLPIDEM TARTRATE 10 MG PO TABS: 10.0000 mg | ORAL_TABLET | Freq: Every day | ORAL | 1 refills | Status: DC

## 2021-01-16 ENCOUNTER — Other Ambulatory Visit: Payer: Self-pay | Admitting: Physician Assistant

## 2021-01-16 DIAGNOSIS — E119 Type 2 diabetes mellitus without complications: Secondary | ICD-10-CM

## 2021-01-22 ENCOUNTER — Other Ambulatory Visit: Payer: Self-pay | Admitting: Physician Assistant

## 2021-01-22 ENCOUNTER — Ambulatory Visit (INDEPENDENT_AMBULATORY_CARE_PROVIDER_SITE_OTHER): Payer: Medicare Other

## 2021-01-22 DIAGNOSIS — E119 Type 2 diabetes mellitus without complications: Secondary | ICD-10-CM | POA: Diagnosis not present

## 2021-01-22 DIAGNOSIS — I10 Essential (primary) hypertension: Secondary | ICD-10-CM

## 2021-01-22 DIAGNOSIS — Z85528 Personal history of other malignant neoplasm of kidney: Secondary | ICD-10-CM

## 2021-01-22 DIAGNOSIS — N1832 Chronic kidney disease, stage 3b: Secondary | ICD-10-CM

## 2021-01-22 NOTE — Patient Instructions (Addendum)
Visit Information   PATIENT GOALS:   Goals Addressed             This Visit's Progress    COMPLETED: Exercise 3x per week (30 min per time)       Start exercising at least 3 times a week for 30 minutes a day.      COMPLETED: Patient Stated       Patient stated needss to change her eating habits and start eating better     Track and Manage My Health Conditions       Timeframe:  Long-Range Goal Priority:  Medium Start Date: 01/22/21                            Expected End Date: 07/25/21                      Follow Up Date 02/26/21    Patient Goals: - begin to check blood sugar 2-3 times/week and check blood sugar if you feel it is too high or too low - take the blood sugar log/meter to all doctor visits - check blood pressure 2-3 times per month and notify provider if it consistent outside of recommended parameters. - take medications as prescribed. - attend provider visits as scheduled/recommended. - Eat healthy meals: fruits, vegetables, lean meats, healthy fats, low salt. Manage portion size - Increase your activity, exercise per provider recommendations.  - Review education regarding meal planning, DM, HF and HLD. Plan to discuss at next telephone call.   Why is this important?   You won't feel high blood pressure, but it can still hurt your blood vessels.  High blood pressure can cause heart or kidney problems. It can also cause a stroke.  Making lifestyle changes like losing a little weight or eating less salt will help.  Checking your blood pressure at home and at different times of the day can help to control blood pressure.  If the doctor prescribes medicine remember to take it the way the doctor ordered.  Call the office if you cannot afford the medicine or if there are questions about it.     Notes:         Consent to CCM Services: Bonnie Torres was given information about Chronic Care Management services today including:  CCM service includes personalized  support from designated clinical staff supervised by her physician, including individualized plan of care and coordination with other care providers 24/7 contact phone numbers for assistance for urgent and routine care needs. Service will only be billed when office clinical staff spend 20 minutes or more in a month to coordinate care. Only one practitioner may furnish and bill the service in a calendar month. The patient may stop CCM services at any time (effective at the end of the month) by phone call to the office staff. The patient will be responsible for cost sharing (co-pay) of up to 20% of the service fee (after annual deductible is met).  Patient agreed to services and verbal consent obtained.   Patient verbalizes understanding of instructions provided today and agrees to view in [place].   Telephone follow up appointment with care management team member scheduled for: 02/26/21 The patient has been provided with contact information for the care management team and has been advised to call with any health related questions or concerns.   Thea Silversmith, RN, MSN, BSN, CCM Care Management Coordinator [facility] [phone]  Diabetes Mellitus and Nutrition, Adult When you have diabetes, or diabetes mellitus, it is very important to have healthy eating habits because your blood sugar (glucose) levels are greatly affected by what you eat and drink. Eating healthy foods in the right amounts, at about the same times every day, can help you: Control your blood glucose. Lower your risk of heart disease. Improve your blood pressure. Reach or maintain a healthy weight. What can affect my meal plan? Every person with diabetes is different, and each person has different needs for a meal plan. Your health care provider may recommend that you work with a dietitian to make a meal plan that is best for you. Your meal plan may vary depending on factors such as: The calories you  need. The medicines you take. Your weight. Your blood glucose, blood pressure, and cholesterol levels. Your activity level. Other health conditions you have, such as heart or kidney disease. How do carbohydrates affect me? Carbohydrates, also called carbs, affect your blood glucose level more than any other type of food. Eating carbs naturally raises the amount of glucose in your blood. Carb counting is a method for keeping track of how many carbs you eat. Counting carbs is important to keep your blood glucose at a healthy level,especially if you use insulin or take certain oral diabetes medicines. It is important to know how many carbs you can safely have in each meal. This is different for every person. Your dietitian can help you calculate how manycarbs you should have at each meal and for each snack. How does alcohol affect me? Alcohol can cause a sudden decrease in blood glucose (hypoglycemia), especially if you use insulin or take certain oral diabetes medicines. Hypoglycemia can be a life-threatening condition. Symptoms of hypoglycemia, such as sleepiness, dizziness, and confusion, are similar to symptoms of having too much alcohol. Do not drink alcohol if: Your health care provider tells you not to drink. You are pregnant, may be pregnant, or are planning to become pregnant. If you drink alcohol: Do not drink on an empty stomach. Limit how much you use to: 0-1 drink a day for women. 0-2 drinks a day for men. Be aware of how much alcohol is in your drink. In the [place] one drink equals one 12 oz bottle of beer (355 mL), one 5 oz glass of wine (148 mL), or one 1 oz glass of hard liquor (44 mL). Keep yourself hydrated with water, diet soda, or unsweetened iced tea. Keep in mind that regular soda, juice, and other mixers may contain a lot of sugar and must be counted as carbs. What are tips for following this plan?  Reading food labels Start by checking the serving size on the "Nutrition  Facts" label of packaged foods and drinks. The amount of calories, carbs, fats, and other nutrients listed on the label is based on one serving of the item. Many items contain more than one serving per package. Check the total grams (g) of carbs in one serving. You can calculate the number of servings of carbs in one serving by dividing the total carbs by 15. For example, if a food has 30 g of total carbs per serving, it would be equal to 2 servings of carbs. Check the number of grams (g) of saturated fats and trans fats in one serving. Choose foods that have a low amount or none of these fats. Check the number of milligrams (mg) of salt (sodium) in one serving. Most people should limit  total sodium intake to less than 2,300 mg per day. Always check the nutrition information of foods labeled as "low-fat" or "nonfat." These foods may be higher in added sugar or refined carbs and should be avoided. Talk to your dietitian to identify your daily goals for nutrients listed on the label. Shopping Avoid buying canned, pre-made, or processed foods. These foods tend to be high in fat, sodium, and added sugar. Shop around the outside edge of the grocery store. This is where you will most often find fresh fruits and vegetables, bulk grains, fresh meats, and fresh dairy. Cooking Use low-heat cooking methods, such as baking, instead of high-heat cooking methods like deep frying. Cook using healthy oils, such as olive, canola, or sunflower oil. Avoid cooking with butter, cream, or high-fat meats. Meal planning Eat meals and snacks regularly, preferably at the same times every day. Avoid going long periods of time without eating. Eat foods that are high in fiber, such as fresh fruits, vegetables, beans, and whole grains. Talk with your dietitian about how many servings of carbs you can eat at each meal. Eat 4-6 oz (112-168 g) of lean protein each day, such as lean meat, chicken, fish, eggs, or tofu. One ounce (oz)  of lean protein is equal to: 1 oz (28 g) of meat, chicken, or fish. 1 egg.  cup (62 g) of tofu. Eat some foods each day that contain healthy fats, such as avocado, nuts, seeds, and fish. What foods should I eat? Fruits Berries. Apples. Oranges. Peaches. Apricots. Plums. Grapes. Mango. Papaya.Pomegranate. Kiwi. Cherries. Vegetables Lettuce. Spinach. Leafy greens, including kale, chard, collard greens, and mustard greens. Beets. Cauliflower. Cabbage. Broccoli. Carrots. Green beans.Tomatoes. Peppers. Onions. Cucumbers. Brussels sprouts. Grains Whole grains, such as whole-wheat or whole-grain bread, crackers, tortillas,cereal, and pasta. Unsweetened oatmeal. Quinoa. Brown or wild rice. Meats and other proteins Seafood. Poultry without skin. Lean cuts of poultry and beef. Tofu. Nuts. Seeds. Dairy Low-fat or fat-free dairy products such as milk, yogurt, and cheese. The items listed above may not be a complete list of foods and beverages you can eat. Contact a dietitian for more information. What foods should I avoid? Fruits Fruits canned with syrup. Vegetables Canned vegetables. Frozen vegetables with butter or cream sauce. Grains Refined white flour and flour products such as bread, pasta, snack foods, andcereals. Avoid all processed foods. Meats and other proteins Fatty cuts of meat. Poultry with skin. Breaded or fried meats. Processed meat.Avoid saturated fats. Dairy Full-fat yogurt, cheese, or milk. Beverages Sweetened drinks, such as soda or iced tea. The items listed above may not be a complete list of foods and beverages you should avoid. Contact a dietitian for more information. Questions to ask a health care provider Do I need to meet with a diabetes educator? Do I need to meet with a dietitian? What number can I call if I have questions? When are the best times to check my blood glucose? Where to find more information: American Diabetes Association: diabetes.org Academy of  Nutrition and Dietetics: www.eatright.[employer] of Diabetes and Digestive and Kidney Diseases: [employer] Association of Diabetes Care and Education Specialists: www.diabeteseducator.org Summary It is important to have healthy eating habits because your blood sugar (glucose) levels are greatly affected by what you eat and drink. A healthy meal plan will help you control your blood glucose and maintain a healthy lifestyle. Your health care provider may recommend that you work with a dietitian to make a meal plan that is best for you. Keep  in mind that carbohydrates (carbs) and alcohol have immediate effects on your blood glucose levels. It is important to count carbs and to use alcohol carefully. This information is not intended to replace advice given to you by your health care provider. Make sure you discuss any questions you have with your healthcare provider. Document Revised: 05/23/2019 Document Reviewed: 05/23/2019 Elsevier Patient Education  2021 [place].      Hypertension, Adult High blood pressure (hypertension) is when the force of blood pumping through the arteries is too strong. The arteries are the blood vessels that carry blood from the heart throughout the body. Hypertension forces the heart to work harder to pump blood and may cause arteries to become narrow or stiff. Untreated or uncontrolled hypertension can cause a heart attack, heart failure, a stroke, kidney disease, and otherproblems. A blood pressure reading consists of a higher number over a lower number. Ideally, your blood pressure should be below 120/80. The first ("top") number is called the systolic pressure. It is a measure of the pressure in your arteries as your heart beats. The second ("bottom") number is called the diastolic pressure. It is a measure of the pressure in your arteries as theheart relaxes. What are the causes? The exact cause of this condition is not known. There are some  conditions thatresult in or are related to high blood pressure. What increases the risk? Some risk factors for high blood pressure are under your control. The following factors may make you more likely to develop this condition: Smoking. Having type 2 diabetes mellitus, high cholesterol, or both. Not getting enough exercise or physical activity. Being overweight. Having too much fat, sugar, calories, or salt (sodium) in your diet. Drinking too much alcohol. Some risk factors for high blood pressure may be difficult or impossible to change. Some of these factors include: Having chronic kidney disease. Having a family history of high blood pressure. Age. Risk increases with age. Race. You may be at higher risk if you are African American. Gender. Men are at higher risk than women before age 52. After age 74, women are at higher risk than men. Having obstructive sleep apnea. Stress. What are the signs or symptoms? High blood pressure may not cause symptoms. Very high blood pressure (hypertensive crisis) may cause: Headache. Anxiety. Shortness of breath. Nosebleed. Nausea and vomiting. Vision changes. Severe chest pain. Seizures. How is this diagnosed? This condition is diagnosed by measuring your blood pressure while you are seated, with your arm resting on a flat surface, your legs uncrossed, and your feet flat on the floor. The cuff of the blood pressure monitor will be placed directly against the skin of your upper arm at the level of your heart. It should be measured at least twice using the same arm. Certain conditions cancause a difference in blood pressure between your right and left arms. Certain factors can cause blood pressure readings to be lower or higher than normal for a short period of time: When your blood pressure is higher when you are in a health care provider's office than when you are at home, this is called white coat hypertension. Most people with this condition do  not need medicines. When your blood pressure is higher at home than when you are in a health care provider's office, this is called masked hypertension. Most people with this condition may need medicines to control blood pressure. If you have a high blood pressure reading during one visit or you have normal blood pressure  with other risk factors, you may be asked to: Return on a different day to have your blood pressure checked again. Monitor your blood pressure at home for 1 week or longer. If you are diagnosed with hypertension, you may have other blood or imaging tests to help your health care provider understand your overall risk for otherconditions. How is this treated? This condition is treated by making healthy lifestyle changes, such as eating healthy foods, exercising more, and reducing your alcohol intake. Your health care provider may prescribe medicine if lifestyle changes are not enough to get your blood pressure under control, and if: Your systolic blood pressure is above 130. Your diastolic blood pressure is above 80. Your personal target blood pressure may vary depending on your medicalconditions, your age, and other factors. Follow these instructions at home: Eating and drinking  Eat a diet that is high in fiber and potassium, and low in sodium, added sugar, and fat. An example eating plan is called the DASH (Dietary Approaches to Stop Hypertension) diet. To eat this way: Eat plenty of fresh fruits and vegetables. Try to fill one half of your plate at each meal with fruits and vegetables. Eat whole grains, such as whole-wheat pasta, brown rice, or whole-grain bread. Fill about one fourth of your plate with whole grains. Eat or drink low-fat dairy products, such as skim milk or low-fat yogurt. Avoid fatty cuts of meat, processed or cured meats, and poultry with skin. Fill about one fourth of your plate with lean proteins, such as fish, chicken without skin, beans, eggs, or  tofu. Avoid pre-made and processed foods. These tend to be higher in sodium, added sugar, and fat. Reduce your daily sodium intake. Most people with hypertension should eat less than 1,500 mg of sodium a day. Do not drink alcohol if: Your health care provider tells you not to drink. You are pregnant, may be pregnant, or are planning to become pregnant. If you drink alcohol: Limit how much you use to: 0-1 drink a day for women. 0-2 drinks a day for men. Be aware of how much alcohol is in your drink. In the [place] one drink equals one 12 oz bottle of beer (355 mL), one 5 oz glass of wine (148 mL), or one 1 oz glass of hard liquor (44 mL).  Lifestyle  Work with your health care provider to maintain a healthy body weight or to lose weight. Ask what an ideal weight is for you. Get at least 30 minutes of exercise most days of the week. Activities may include walking, swimming, or biking. Include exercise to strengthen your muscles (resistance exercise), such as Pilates or lifting weights, as part of your weekly exercise routine. Try to do these types of exercises for 30 minutes at least 3 days a week. Do not use any products that contain nicotine or tobacco, such as cigarettes, e-cigarettes, and chewing tobacco. If you need help quitting, ask your health care provider. Monitor your blood pressure at home as told by your health care provider. Keep all follow-up visits as told by your health care provider. This is important.  Medicines Take over-the-counter and prescription medicines only as told by your health care provider. Follow directions carefully. Blood pressure medicines must be taken as prescribed. Do not skip doses of blood pressure medicine. Doing this puts you at risk for problems and can make the medicine less effective. Ask your health care provider about side effects or reactions to medicines that you should watch for. Contact  a health care provider if you: Think you are having a  reaction to a medicine you are taking. Have headaches that keep coming back (recurring). Feel dizzy. Have swelling in your ankles. Have trouble with your vision. Get help right away if you: Develop a severe headache or confusion. Have unusual weakness or numbness. Feel faint. Have severe pain in your chest or abdomen. Vomit repeatedly. Have trouble breathing. Summary Hypertension is when the force of blood pumping through your arteries is too strong. If this condition is not controlled, it may put you at risk for serious complications. Your personal target blood pressure may vary depending on your medical conditions, your age, and other factors. For most people, a normal blood pressure is less than 120/80. Hypertension is treated with lifestyle changes, medicines, or a combination of both. Lifestyle changes include losing weight, eating a healthy, low-sodium diet, exercising more, and limiting alcohol. This information is not intended to replace advice given to you by your health care provider. Make sure you discuss any questions you have with your healthcare provider. Document Revised: 02/23/2018 Document Reviewed: 02/23/2018 Elsevier Patient Education  2022 [place].      High Cholesterol  High cholesterol is a condition in which the blood has high levels of a white, waxy substance similar to fat (cholesterol). The liver makes all the cholesterol that the body needs. The human body needs small amounts of cholesterol to help build cells. A person gets extra orexcess cholesterol from the food that he or she eats. The blood carries cholesterol from the liver to the rest of the body. If you have high cholesterol, deposits (plaques) may build up on the walls of your arteries. Arteries are the blood vessels that carry blood away from your heart. These plaques make the arteries narrowand stiff. Cholesterol plaques increase your risk for heart attack and stroke. Work withyour health care  provider to keep your cholesterol levels in a healthy range. What increases the risk? The following factors may make you more likely to develop this condition: Eating foods that are high in animal fat (saturated fat) or cholesterol. Being overweight. Not getting enough exercise. A family history of high cholesterol (familial hypercholesterolemia). Use of tobacco products. Having diabetes. What are the signs or symptoms? There are no symptoms of this condition. How is this diagnosed? This condition may be diagnosed based on the results of a blood test. If you are older than 63 years of age, your health care provider may check your cholesterol levels every 4-6 years. You may be checked more often if you have high cholesterol or other risk factors for heart disease. The blood test for cholesterol measures: "Bad" cholesterol, or LDL cholesterol. This is the main type of cholesterol that causes heart disease. The desired level is less than 100 mg/dL. "Good" cholesterol, or HDL cholesterol. HDL helps protect against heart disease by cleaning the arteries and carrying the LDL to the liver for processing. The desired level for HDL is 60 mg/dL or higher. Triglycerides. These are fats that your body can store or burn for energy. The desired level is less than 150 mg/dL. Total cholesterol. This measures the total amount of cholesterol in your blood and includes LDL, HDL, and triglycerides. The desired level is less than 200 mg/dL. How is this treated? This condition may be treated with: Diet changes. You may be asked to eat foods that have more fiber and less saturated fats or added sugar. Lifestyle changes. These may include regular exercise, maintaining  a healthy weight, and quitting use of tobacco products. Medicines. These are given when diet and lifestyle changes have not worked. You may be prescribed a statin medicine to help lower your cholesterol levels. Follow these instructions at  home: Eating and drinking  Eat a healthy, balanced diet. This diet includes: Daily servings of a variety of fresh, frozen, or canned fruits and vegetables. Daily servings of whole grain foods that are rich in fiber. Foods that are low in saturated fats and trans fats. These include poultry and fish without skin, lean cuts of meat, and low-fat dairy products. A variety of fish, especially oily fish that contain omega-3 fatty acids. Aim to eat fish at least 2 times a week. Avoid foods and drinks that have added sugar. Use healthy cooking methods, such as roasting, grilling, broiling, baking, poaching, steaming, and stir-frying. Do not fry your food except for stir-frying.  Lifestyle  Get regular exercise. Aim to exercise for a total of 150 minutes a week. Increase your activity level by doing activities such as gardening, walking, and taking the stairs. Do not use any products that contain nicotine or tobacco, such as cigarettes, e-cigarettes, and chewing tobacco. If you need help quitting, ask your health care provider.  General instructions Take over-the-counter and prescription medicines only as told by your health care provider. Keep all follow-up visits as told by your health care provider. This is important. Where to find more information American Heart Association: www.heart.org National Heart, Lung, and Blood Institute: https://wilson-eaton.com/ Contact a health care provider if: You have trouble achieving or maintaining a healthy diet or weight. You are starting an exercise program. You are unable to stop smoking. Get help right away if: You have chest pain. You have trouble breathing. You have any symptoms of a stroke. "BE FAST" is an easy way to remember the main warning signs of a stroke: B - Balance. Signs are dizziness, sudden trouble walking, or loss of balance. E - Eyes. Signs are trouble seeing or a sudden change in vision. F - Face. Signs are sudden weakness or numbness of the  face, or the face or eyelid drooping on one side. A - Arms. Signs are weakness or numbness in an arm. This happens suddenly and usually on one side of the body. S - Speech. Signs are sudden trouble speaking, slurred speech, or trouble understanding what people say. T - Time. Time to call emergency services. Write down what time symptoms started. You have other signs of a stroke, such as: A sudden, severe headache with no known cause. Nausea or vomiting. Seizure. These symptoms may represent a serious problem that is an emergency. Do not wait to see if the symptoms will go away. Get medical help right away. Call your local emergency services (911 in the [place] Do not drive yourself to the hospital. Summary Cholesterol plaques increase your risk for heart attack and stroke. Work with your health care provider to keep your cholesterol levels in a healthy range. Eat a healthy, balanced diet, get regular exercise, and maintain a healthy weight. Do not use any products that contain nicotine or tobacco, such as cigarettes, e-cigarettes, and chewing tobacco. Get help right away if you have any symptoms of a stroke. This information is not intended to replace advice given to you by your health care provider. Make sure you discuss any questions you have with your healthcare provider. Document Revised: 05/15/2019 Document Reviewed: 05/15/2019 Elsevier Patient Education  2022 [place] PLAN:  Patient Care Plan: Chronic disease     Problem Identified: Long term care plan for self managment of cadiovascular disease in a patient with CKD stage III and DM   Priority: Medium     Long-Range Goal: Disease Progression Prevented or Minimized   Start Date: 01/22/2021  Expected End Date: 07/25/2021  Priority: Medium  Note:   Objective:  Last practice recorded BP readings:  BP Readings from Last 3 Encounters:  11/05/20 119/67  05/08/20 102/65  04/18/20 94/60       Component Value  Date/Time   CHOL 170 03/11/2020 1457   TRIG 90 03/11/2020 1457   HDL 48 (L) 03/11/2020 1457   CHOLHDL 3.5 03/11/2020 1457   VLDL 29 10/14/2015 1028   LDLCALC 104 (H) 03/11/2020 1457  Objective:  Lab Results  Component Value Date   HGBA1C 6.0 (A) 11/05/2020   Lab Results  Component Value Date   CREATININE 1.35 (H) 11/05/2020   CREATININE 1.16 (H) 03/11/2020   CREATININE 0.73 12/05/2019  Current Barriers:  Knowledge Deficits related to long term care plan for self health management of HTN, HLD, DM in a patient with history of CKD III and history of bilateral renal carcinoma. Patient reports blood pressure is controlled, last reading patient took was 120/67 about 2.5 weeks ago. She acknowledges that she does not check her blood sugar. She reports it was last checked last month. A1C 6.0 on 11/05/20. She reports she is able to afford her medications and has transportation to provider appointment. She denies any specific care management needs at this time. Does not routinely check blood sugars   Case Manager Clinical Goal(s):  patient will demonstrate improved adherence to prescribed treatment plan for diabetes self care/management as evidenced by: improvement in monitoring and recording of CBG; adherence to ADA/ carb modified diet; adherence to prescribed medication regimen; contacting provider for new or worsened symptoms or questions patient will work with Consulting civil engineer, providers, and care team towards execution of optimized self-health management plan Interventions:  Collaboration with Donella Stade, PA-C regarding development and update of comprehensive plan of care as evidenced by provider attestation and co-signature Inter-disciplinary care team collaboration (see longitudinal plan of care) Evaluation of current treatment plan related to HTN, HLD, DM and patient's adherence to plan as established by provider. Reviewed medications with patient and discussed importance of medication  adherence Discussed importance of knowing blood sugars and managing blood sugar levels related impact on kidney's and other organ systems. Client agrees to check blood sugar more often. Reviewed scheduled/upcoming provider appointments. Provided education to patient regarding nutrition ( HTN, DM) and HLD. Discussed plans with patient for ongoing care management follow up and provided patient with direct contact information for care management team Self-Care Activities - Self administers oral medications as prescribed Attends all scheduled provider appointments Patient Goals: - begin to check blood sugar 2-3 times/week and check blood sugar if you feel it is too high or too low - take the blood sugar log/meter to all doctor visits - check blood pressure 2-3 times per month and notify provider if it consistent outside of recommended parameters. - take medications as prescribed. - attend provider visits as scheduled/recommended. - Eat healthy meals: fruits, vegetables, lean meats, healthy fats, low salt. Manage portion size - Review education regarding meal planning, DM, HF and HLD. Plan to discuss at next telephone call. Follow Up Plan: Telephone follow up appointment with care management team member scheduled for: 02/26/21 The patient has been provided with  contact information for the care management team and has been advised to call with any health related questions or concerns.

## 2021-01-22 NOTE — Chronic Care Management (AMB) (Addendum)
Chronic Care Management   CCM RN Visit Note  01/22/2021 Name: Bonnie Torres MRN: [id number] DOB: 1956-03-08  Subjective: Bonnie Torres is a 65 y.o. year old female who is a primary care patient of Donella Stade, PA-C. The care management team was consulted for assistance with disease management and care coordination needs.    Engaged with patient by telephone for initial visit in response to provider referral for case management and/or care coordination services.   Consent to Services:  The patient was given the following information about Chronic Care Management services today, agreed to services, and gave verbal consent: 1. CCM service includes personalized support from designated clinical staff supervised by the primary care provider, including individualized plan of care and coordination with other care providers 2. 24/7 contact phone numbers for assistance for urgent and routine care needs. 3. Service will only be billed when office clinical staff spend 20 minutes or more in a month to coordinate care. 4. Only one practitioner may furnish and bill the service in a calendar month. 5.The patient may stop CCM services at any time (effective at the end of the month) by phone call to the office staff. 6. The patient will be responsible for cost sharing (co-pay) of up to 20% of the service fee (after annual deductible is met). Patient agreed to services and consent obtained.  Patient agreed to services and verbal consent obtained.   Assessment: Review of patient past medical history, allergies, medications, health status, including review of consultants reports, laboratory and other test data, was performed as part of comprehensive evaluation and provision of chronic care management services.   SDOH (Social Determinants of Health) assessments and interventions performed:  SDOH Interventions    Flowsheet Row Most Recent Value  SDOH Interventions   Food Insecurity Interventions  Intervention Not Indicated  Transportation Interventions Intervention Not Indicated        CCM Care Plan  No Known Allergies  Outpatient Encounter Medications as of 01/22/2021  Medication Sig   atorvastatin (LIPITOR) 40 MG tablet Take 1 tablet (40 mg total) by mouth daily. Needs labs   clindamycin (CLEOCIN T) 1 % external solution Apply 1 application topically 2 (two) times daily.   diclofenac Sodium (VOLTAREN) 1 % GEL APPLY 4GM TOPICALLY 4 TIMES DAILY TO LOWER EXTREMITY  JOINT   metFORMIN (GLUCOPHAGE-XR) 750 MG 24 hr tablet TAKE 1 TABLET BY MOUTH  DAILY WITH BREAKFAST   metoprolol succinate (TOPROL-XL) 100 MG 24 hr tablet Take 1 tablet (100 mg total) by mouth daily. appt for refills   Olmesartan-amLODIPine-HCTZ 20-5-12.5 MG TABS Take 1 tablet by mouth daily.   ONETOUCH VERIO test strip CHECK BLOOD GLUCOSE TWICE  DAILY AS DIRECTED AS NEEDED   tretinoin (RETIN-A) 0.05 % cream Apply 1 application topically at bedtime.    zolpidem (AMBIEN) 10 MG tablet Take 1 tablet (10 mg total) by mouth at bedtime.   Blood Glucose Monitoring Suppl ([place] FLEX SYSTEM) w/Device KIT Check fasting blood sugar every morning. DM ICD10 E11.9   cyclobenzaprine (FLEXERIL) 10 MG tablet Take 1 tablet (10 mg total) by mouth 3 (three) times daily as needed for muscle spasms. (Patient not taking: Reported on 01/22/2021)   Lancets (ONETOUCH DELICA PLUS [id number]) MISC USE TO CHECK BLOOD SUGAR  TWICE DAILY   potassium chloride SA (KLOR-CON) 20 MEQ tablet Take 1 tablet (20 mEq total) by mouth once for 1 dose. Patient needs appointment.   No facility-administered encounter medications on file as of 01/22/2021.  Patient Active Problem List   Diagnosis Date Noted   DDD (degenerative disc disease), lumbar 11/12/2020   Chronic kidney disease (CKD), stage III (moderate) ([place]) 03/12/2020   Bilateral plantar fasciitis 01/29/2020   neoplasm left kidney 01/29/2020   Hx of partial nephrectomy 01/29/2020   Neoplasm of  left kidney 11/30/2019   Cubital tunnel syndrome, left 11/20/2019   Personal history of gout 01/25/2019   Acute gout of right foot 01/25/2019   Elevated uric acid in blood 01/25/2019   Mid back pain on left side 07/29/2018   Irregular heart rate 04/06/2018   Hypotension 04/06/2018   Elevated serum creatinine 12/07/2017   Type 2 diabetes mellitus without complication, without long-term current use of insulin ([place]) 12/03/2017   Class 1 obesity due to excess calories without serious comorbidity with body mass index (BMI) of 33.0 to 33.9 in adult 12/03/2017   Trigger middle finger of left hand 12/01/2017   Anterior cervical adenopathy 05/19/2017   Hypokalemia 04/02/2017   Obesity (BMI 30.0-34.9) 09/16/2016   Hot flashes due to surgical menopause 08/19/2016   Lymphangioma 06/18/2016   Uterine mass 04/27/2016   Mass of kidney 04/05/2016   Elevated testosterone level in female 03/25/2016   Breast calcification, right 11/13/2015   Primary gout 10/16/2015   Bilateral low back pain without sciatica 04/17/2015   Hyperlipidemia 09/28/2014   Insomnia 08/31/2014   Essential hypertension, benign 08/31/2014    Conditions to be addressed/monitored:HTN, HLD, DMII, and CKD Stage III  Care Plan : Chronic disease  Updates made by Luretha Rued, RN since 01/22/2021 12:00 AM     Problem: Long term care plan for self managment of cadiovascular disease in a patient with CKD stage III and DM   Priority: Medium     Long-Range Goal: Disease Progression Prevented or Minimized   Start Date: 01/22/2021  Expected End Date: 07/25/2021  Priority: Medium  Note:   Objective:  Last practice recorded BP readings:  BP Readings from Last 3 Encounters:  11/05/20 119/67  05/08/20 102/65  04/18/20 94/60       Component Value Date/Time   CHOL 170 03/11/2020 1457   TRIG 90 03/11/2020 1457   HDL 48 (L) 03/11/2020 1457   CHOLHDL 3.5 03/11/2020 1457   VLDL 29 10/14/2015 1028   LDLCALC 104 (H) 03/11/2020  1457  Objective:  Lab Results  Component Value Date   HGBA1C 6.0 (A) 11/05/2020   Lab Results  Component Value Date   CREATININE 1.35 (H) 11/05/2020   CREATININE 1.16 (H) 03/11/2020   CREATININE 0.73 12/05/2019  Current Barriers:  Knowledge Deficits related to long term care plan for self health management of HTN, HLD, DM in a patient with history of CKD III and history of bilateral renal carcinoma. Patient reports blood pressure is controlled, last reading patient took was 120/67 about 2.5 weeks ago. She acknowledges that she does not check her blood sugar. She reports it was last checked last month. A1C 6.0 on 11/05/20. She reports she is able to afford her medications and has transportation to provider appointment. She denies any specific care management needs at this time. Does not routinely check blood sugars   Case Manager Clinical Goal(s):  patient will demonstrate improved adherence to prescribed treatment plan for diabetes self care/management as evidenced by: improvement in monitoring and recording of CBG; adherence to ADA/ carb modified diet; adherence to prescribed medication regimen; contacting provider for new or worsened symptoms or questions patient will work with Consulting civil engineer, providers,  and care team towards execution of optimized self-health management plan Interventions:  Collaboration with Donella Stade, PA-C regarding development and update of comprehensive plan of care as evidenced by provider attestation and co-signature Inter-disciplinary care team collaboration (see longitudinal plan of care) Evaluation of current treatment plan related to HTN, HLD, DM and patient's adherence to plan as established by provider. Reviewed medications with patient and discussed importance of medication adherence Discussed importance of knowing blood sugars and managing blood sugar levels related impact on kidney's and other organ systems. Client agrees to check blood sugar more  often. Increase your activity, exercise per provider recommendations.  Reviewed scheduled/upcoming provider appointments. Provided education to patient regarding nutrition ( HTN, DM) and HLD. Discussed plans with patient for ongoing care management follow up and provided patient with direct contact information for care management team Self-Care Activities - Self administers oral medications as prescribed Attends all scheduled provider appointments Patient Goals: - begin to check blood sugar 2-3 times/week and check blood sugar if you feel it is too high or too low - take the blood sugar log/meter to all doctor visits - check blood pressure 2-3 times per month and notify provider if it consistent outside of recommended parameters. - take medications as prescribed. - attend provider visits as scheduled/recommended. - Eat healthy meals: fruits, vegetables, lean meats, healthy fats, low salt. Manage portion size - Increase your activity, exercise per provider recommendations.  - Review education regarding meal planning, DM, HF and HLD. Plan to discuss at next telephone call. Follow Up Plan: Telephone follow up appointment with care management team member scheduled for: 02/26/21 The patient has been provided with contact information for the care management team and has been advised to call with any health related questions or concerns.      Plan:Telephone follow up appointment with care management team member scheduled for:  next month and The patient has been provided with contact information for the care management team and has been advised to call with any health related questions or concerns.   Thea Silversmith, RN, MSN, BSN, CCM Care Management Coordinator [facility] [phone]

## 2021-02-26 ENCOUNTER — Ambulatory Visit (INDEPENDENT_AMBULATORY_CARE_PROVIDER_SITE_OTHER): Payer: Medicare Other

## 2021-02-26 DIAGNOSIS — I10 Essential (primary) hypertension: Secondary | ICD-10-CM

## 2021-02-26 DIAGNOSIS — E119 Type 2 diabetes mellitus without complications: Secondary | ICD-10-CM | POA: Diagnosis not present

## 2021-02-26 NOTE — Patient Instructions (Signed)
Visit Information  PATIENT GOALS:  Goals Addressed             This Visit's Progress    Track and Manage My Health Conditions       Timeframe:  Long-Range Goal Priority:  Medium Start Date: 01/22/21                            Expected End Date: 07/25/21                      Follow Up Date 03/26/21    Patient Goals: - obtain battery for glucose meter and continue to  check blood sugars at least 2-3 times/week and check blood sugar if you feel it is too high or too low - take the blood sugar log/meter to all doctor visits - check blood pressure weekly and notify provider if it consistent outside of recommended parameters. - continue to take medications as prescribed. - continue to attend provider visits as scheduled/recommended. - continue to eat healthy meals: fruits, vegetables, lean meats, healthy fats, low salt. Manage portion size - continue to remain active/exercise per provider recommendations.  Why is this important?   You won't feel high blood pressure, but it can still hurt your blood vessels.  High blood pressure can cause heart or kidney problems. It can also cause a stroke.  Making lifestyle changes like losing a little weight or eating less salt will help.  Checking your blood pressure at home and at different times of the day can help to control blood pressure.  If the doctor prescribes medicine remember to take it the way the doctor ordered.  Call the office if you cannot afford the medicine or if there are questions about it.        Patient verbalizes understanding of instructions provided today and agrees to view in [place].   Telephone follow up appointment with care management team member scheduled for:03/26/21 The patient has been provided with contact information for the care management team and has been advised to call with any health related questions or concerns.   Thea Silversmith, RN, MSN, BSN, CCM Care Management Coordinator [facility]  [place] [facility] [phone]

## 2021-02-28 NOTE — Chronic Care Management (AMB) (Signed)
Chronic Care Management   CCM RN Visit Note  02/28/2021 Name: Bonnie Torres MRN: [id number] DOB: Jun 19, 1956  Subjective: Bonnie Torres is a 65 y.o. year old female who is a primary care patient of Donella Stade, PA-C. The care management team was consulted for assistance with disease management and care coordination needs.    Engaged with patient by telephone for follow up visit in response to provider referral for case management and/or care coordination services.   Consent to Services:  The patient was given information about Chronic Care Management services, agreed to services, and gave verbal consent prior to initiation of services.  Please see initial visit note for detailed documentation.   Patient agreed to services and verbal consent obtained.   Assessment: Review of patient past medical history, allergies, medications, health status, including review of consultants reports, laboratory and other test data, was performed as part of comprehensive evaluation and provision of chronic care management services.   SDOH (Social Determinants of Health) assessments and interventions performed:    CCM Care Plan  No Known Allergies  Outpatient Encounter Medications as of 02/26/2021  Medication Sig   atorvastatin (LIPITOR) 40 MG tablet Take 1 tablet (40 mg total) by mouth daily. Needs labs   Blood Glucose Monitoring Suppl ([place]) w/Device KIT Check fasting blood sugar every morning. DM ICD10 E11.9   clindamycin (CLEOCIN T) 1 % external solution Apply 1 application topically 2 (two) times daily.   cyclobenzaprine (FLEXERIL) 10 MG tablet Take 1 tablet (10 mg total) by mouth 3 (three) times daily as needed for muscle spasms. (Patient not taking: Reported on 01/22/2021)   diclofenac Sodium (VOLTAREN) 1 % GEL APPLY 4GM TOPICALLY 4 TIMES DAILY TO LOWER EXTREMITY  JOINT   Lancets (ONETOUCH DELICA PLUS [id number]) MISC USE TO CHECK BLOOD SUGAR  TWICE DAILY   metFORMIN  (GLUCOPHAGE-XR) 750 MG 24 hr tablet TAKE 1 TABLET BY MOUTH  DAILY WITH BREAKFAST   metoprolol succinate (TOPROL-XL) 100 MG 24 hr tablet Take 1 tablet (100 mg total) by mouth daily. appt for refills   Olmesartan-amLODIPine-HCTZ 20-5-12.5 MG TABS Take 1 tablet by mouth daily.   ONETOUCH VERIO test strip CHECK BLOOD GLUCOSE TWICE  DAILY AS DIRECTED AS NEEDED   potassium chloride SA (KLOR-CON) 20 MEQ tablet Take 1 tablet (20 mEq total) by mouth once for 1 dose. Patient needs appointment.   tretinoin (RETIN-A) 0.05 % cream Apply 1 application topically at bedtime.    zolpidem (AMBIEN) 10 MG tablet Take 1 tablet (10 mg total) by mouth at bedtime.   No facility-administered encounter medications on file as of 02/26/2021.    Patient Active Problem List   Diagnosis Date Noted   DDD (degenerative disc disease), lumbar 11/12/2020   Chronic kidney disease (CKD), stage III (moderate) ([place]) 03/12/2020   Bilateral plantar fasciitis 01/29/2020   neoplasm left kidney 01/29/2020   Hx of partial nephrectomy 01/29/2020   Neoplasm of left kidney 11/30/2019   Cubital tunnel syndrome, left 11/20/2019   Personal history of gout 01/25/2019   Acute gout of right foot 01/25/2019   Elevated uric acid in blood 01/25/2019   Mid back pain on left side 07/29/2018   Irregular heart rate 04/06/2018   Hypotension 04/06/2018   Elevated serum creatinine 12/07/2017   Type 2 diabetes mellitus without complication, without long-term current use of insulin ([place]) 12/03/2017   Class 1 obesity due to excess calories without serious comorbidity with body mass index (BMI) of 33.0 to  33.9 in adult 12/03/2017   Trigger middle finger of left hand 12/01/2017   Anterior cervical adenopathy 05/19/2017   Hypokalemia 04/02/2017   Obesity (BMI 30.0-34.9) 09/16/2016   Hot flashes due to surgical menopause 08/19/2016   Lymphangioma 06/18/2016   Uterine mass 04/27/2016   Mass of kidney 04/05/2016   Elevated testosterone level in female  03/25/2016   Breast calcification, right 11/13/2015   Primary gout 10/16/2015   Bilateral low back pain without sciatica 04/17/2015   Hyperlipidemia 09/28/2014   Insomnia 08/31/2014   Essential hypertension, benign 08/31/2014    Conditions to be addressed/monitored:HTN, HLD, and DMII  There are no care plans that you recently modified to display for this patient.   Plan:The patient has been provided with contact information for the care management team and has been advised to call with any health related questions or concerns.  and The care management team will reach out to the patient again over the next 30 days.  Thea Silversmith, RN, MSN, BSN, CCM Care Management Coordinator [facility] [phone]

## 2021-03-22 ENCOUNTER — Other Ambulatory Visit: Payer: Self-pay | Admitting: Physician Assistant

## 2021-03-22 DIAGNOSIS — M549 Dorsalgia, unspecified: Secondary | ICD-10-CM

## 2021-03-22 DIAGNOSIS — M722 Plantar fascial fibromatosis: Secondary | ICD-10-CM

## 2021-03-22 DIAGNOSIS — M5442 Lumbago with sciatica, left side: Secondary | ICD-10-CM

## 2021-03-22 DIAGNOSIS — G8929 Other chronic pain: Secondary | ICD-10-CM

## 2021-03-26 ENCOUNTER — Ambulatory Visit (INDEPENDENT_AMBULATORY_CARE_PROVIDER_SITE_OTHER): Payer: Medicare Other

## 2021-03-26 DIAGNOSIS — E119 Type 2 diabetes mellitus without complications: Secondary | ICD-10-CM

## 2021-03-26 DIAGNOSIS — I10 Essential (primary) hypertension: Secondary | ICD-10-CM

## 2021-03-26 DIAGNOSIS — E782 Mixed hyperlipidemia: Secondary | ICD-10-CM

## 2021-03-26 NOTE — Chronic Care Management (AMB) (Signed)
Chronic Care Management   CCM RN Visit Note  03/26/2021 Name: Bonnie Torres MRN: [id number] DOB: 01/04/1956  Subjective: Bonnie Torres is a 65 y.o. year old female who is a primary care patient of Bonnie Stade, PA-C. The care management team was consulted for assistance with disease management and care coordination needs.    Engaged with patient by telephone for follow up visit in response to provider referral for case management and/or care coordination services.   Consent to Services:  The patient was given information about Chronic Care Management services, agreed to services, and gave verbal consent prior to initiation of services.  Please see initial visit note for detailed documentation.   Patient agreed to services and verbal consent obtained.   Assessment: Review of patient past medical history, allergies, medications, health status, including review of consultants reports, laboratory and other test data, was performed as part of comprehensive evaluation and provision of chronic care management services.   SDOH (Social Determinants of Health) assessments and interventions performed:    CCM Care Plan  No Known Allergies  Outpatient Encounter Medications as of 03/26/2021  Medication Sig Note   atorvastatin (LIPITOR) 40 MG tablet Take 1 tablet (40 mg total) by mouth daily. Needs labs    cyclobenzaprine (FLEXERIL) 10 MG tablet TAKE 1 TABLET BY MOUTH 3  TIMES DAILY AS NEEDED FOR  MUSCLE SPASM(S)    diclofenac Sodium (VOLTAREN) 1 % GEL APPLY 4 GRAMS TOPICALLY 4  TIMES DAILY TO LOWER  EXTREMITY JOINT    metFORMIN (GLUCOPHAGE-XR) 750 MG 24 hr tablet TAKE 1 TABLET BY MOUTH  DAILY WITH BREAKFAST    metoprolol succinate (TOPROL-XL) 100 MG 24 hr tablet Take 1 tablet (100 mg total) by mouth daily. appt for refills    Olmesartan-amLODIPine-HCTZ 20-5-12.5 MG TABS Take 1 tablet by mouth daily.    tretinoin (RETIN-A) 0.05 % cream Apply 1 application topically at bedtime.     zolpidem  (AMBIEN) 10 MG tablet Take 1 tablet (10 mg total) by mouth at bedtime.    Blood Glucose Monitoring Suppl ([place] FLEX SYSTEM) w/Device KIT Check fasting blood sugar every morning. DM ICD10 E11.9    clindamycin (CLEOCIN T) 1 % external solution Apply 1 application topically 2 (two) times daily.    Lancets (ONETOUCH DELICA PLUS [id number]) MISC USE TO CHECK BLOOD SUGAR  TWICE DAILY    ONETOUCH VERIO test strip CHECK BLOOD GLUCOSE TWICE  DAILY AS DIRECTED AS NEEDED    potassium chloride SA (KLOR-CON) 20 MEQ tablet Take 1 tablet (20 mEq total) by mouth once for 1 dose. Patient needs appointment. 03/26/2021: Reports she is taking one 20 meq tablet once a day.   No facility-administered encounter medications on file as of 03/26/2021.    Patient Active Problem List   Diagnosis Date Noted   DDD (degenerative disc disease), lumbar 11/12/2020   Chronic kidney disease (CKD), stage III (moderate) ([place]) 03/12/2020   Bilateral plantar fasciitis 01/29/2020   neoplasm left kidney 01/29/2020   Hx of partial nephrectomy 01/29/2020   Neoplasm of left kidney 11/30/2019   Cubital tunnel syndrome, left 11/20/2019   Personal history of gout 01/25/2019   Acute gout of right foot 01/25/2019   Elevated uric acid in blood 01/25/2019   Mid back pain on left side 07/29/2018   Irregular heart rate 04/06/2018   Hypotension 04/06/2018   Elevated serum creatinine 12/07/2017   Type 2 diabetes mellitus without complication, without long-term current use of insulin ([place]) 12/03/2017  Class 1 obesity due to excess calories without serious comorbidity with body mass index (BMI) of 33.0 to 33.9 in adult 12/03/2017   Trigger middle finger of left hand 12/01/2017   Anterior cervical adenopathy 05/19/2017   Hypokalemia 04/02/2017   Obesity (BMI 30.0-34.9) 09/16/2016   Hot flashes due to surgical menopause 08/19/2016   Lymphangioma 06/18/2016   Uterine mass 04/27/2016   Mass of kidney 04/05/2016   Elevated  testosterone level in female 03/25/2016   Breast calcification, right 11/13/2015   Primary gout 10/16/2015   Bilateral low back pain without sciatica 04/17/2015   Hyperlipidemia 09/28/2014   Insomnia 08/31/2014   Essential hypertension, benign 08/31/2014    Conditions to be addressed/monitored:HTN, HLD, DMII, and CKD Stage III  Care Plan : Chronic disease  Updates made by Bonnie Rued, RN since 03/26/2021 12:00 AM     Problem: Long term care plan for self managment of cadiovascular disease in a patient with CKD stage III and DM   Priority: Medium     Long-Range Goal: Disease Progression Prevented or Minimized   Start Date: 01/22/2021  Expected End Date: 07/25/2021  This Visit's Progress: On track  Recent Progress: On track  Priority: Medium  Note:   Objective:  Last practice recorded BP readings:  BP Readings from Last 3 Encounters:  11/05/20 119/67  05/08/20 102/65  04/18/20 94/60       Component Value Date/Time   CHOL 170 03/11/2020 1457   TRIG 90 03/11/2020 1457   HDL 48 (L) 03/11/2020 1457   CHOLHDL 3.5 03/11/2020 1457   VLDL 29 10/14/2015 1028   LDLCALC 104 (H) 03/11/2020 1457  Objective:  Lab Results  Component Value Date   HGBA1C 6.0 (A) 11/05/2020   Lab Results  Component Value Date   CREATININE 1.35 (H) 11/05/2020   CREATININE 1.16 (H) 03/11/2020   CREATININE 0.73 12/05/2019  Current Barriers:  Knowledge Deficits related to long term care plan for self health management of HTN, HLD, DM in a patient with history of CKD III and history of bilateral renal carcinoma. Patient reports she is checking blood pressure and blood sugars more often. Blood pressure today was 120/76 HR 70. She reports blood pressure has ranged from 102/66-140/62. Bonnie Torres reports Blood sugar on 03/23/21- 96; 03/22/21- 88; 03/20/21- 92; 03/13/21- 107; 03/14/21 -98, with blood sugars ranging 88-115 since last telephone assessment.  She denies any signs/symptoms of hyperglycemia or  hypoglycemia. Without questions or concerns at this time.  Does not routinely check blood sugars-improved   Case Manager Clinical Goal(s):  patient will demonstrate improved adherence to prescribed treatment plan for diabetes self care/management as evidenced by: improvement in monitoring and recording of CBG; adherence to ADA/ carb modified diet; adherence to prescribed medication regimen; contacting provider for new or worsened symptoms or questions patient will work with Consulting civil engineer, providers, and care team towards execution of optimized self-health management plan Interventions:  Collaboration with Bonnie Stade, PA-C regarding development and update of comprehensive plan of care as evidenced by provider attestation and co-signature Inter-disciplinary care team collaboration (see longitudinal plan of care) Evaluation of current treatment plan related to HTN, HLD, DM and patient's adherence to plan as established by provider. Medications reviewed with patient: KCL 20 meq listed as once on profile. Per patient she is taking potassium 20 meq daily.  Care coordination with primary care provider and referral to clinical pharmacist for medication review/reconciliation. RNCM reinforced importance of low salt diet, and healthy eating plan.  Reviewed upcoming provider appointments. Patient states she will call to ensure she has a next appointment scheduled. Discussed plans with patient for ongoing care management follow up and provided patient with direct contact information for care management team Self-Care Activities - Self administers oral medications as prescribed Attends all scheduled provider appointments Patient Goals: - continue to  check blood sugars at least 2-3 times/week and check blood sugar if you feel it is too high or too low - take the blood sugar log/meter to all doctor visits - check blood pressure weekly and notify provider if blood pressure is consistently outside of  recommended parameters. - continue to take medications as prescribed. - continue to attend provider visits as recommended. - continue to eat healthy meals: fruits, vegetables, lean meats, healthy fats, low salt. Manage portion size - continue to remain active/exercise per provider recommendations.  Follow Up Plan: Telephone follow up appointment with care management team member scheduled for: next month The patient has been provided with contact information for the care management team and has been advised to call with any health related questions or concerns.      Plan:Telephone follow up appointment with care management team member scheduled for:  next month and The patient has been provided with contact information for the care management team and has been advised to call with any health related questions or concerns.   Thea Silversmith, RN, MSN, BSN, CCM Care Management Coordinator [facility] [phone]

## 2021-03-26 NOTE — Patient Instructions (Signed)
Visit Information  PATIENT GOALS:  Goals Addressed             This Visit's Progress    Track and Manage My Health Conditions   On track    Timeframe:  Long-Range Goal Priority:  Medium Start Date: 01/22/21                            Expected End Date: 07/25/21                      Follow Up Date 04/23/21    Patient Goals: - continue to  check blood sugars at least 2-3 times/week and check blood sugar if you feel it is too high or too low - take the blood sugar log/meter to all doctor visits - check blood pressure weekly and notify provider if blood pressure is consistently outside of recommended parameters. - continue to take medications as prescribed. - continue to attend provider visits as recommended. - continue to eat healthy meals: fruits, vegetables, lean meats, healthy fats, low salt. Manage portion size - continue to remain active/exercise per provider recommendations.         Patient verbalizes understanding of instructions provided today and agrees to view in [place].   Telephone follow up appointment with care management team member scheduled for: 04/23/21 The patient has been provided with contact information for the care management team and has been advised to call with any health related questions or concerns.   Thea Silversmith, RN, MSN, BSN, CCM Care Management Coordinator [facility] [phone]

## 2021-03-28 DIAGNOSIS — E782 Mixed hyperlipidemia: Secondary | ICD-10-CM

## 2021-03-28 DIAGNOSIS — I10 Essential (primary) hypertension: Secondary | ICD-10-CM

## 2021-03-28 DIAGNOSIS — E119 Type 2 diabetes mellitus without complications: Secondary | ICD-10-CM | POA: Diagnosis not present

## 2021-04-11 ENCOUNTER — Other Ambulatory Visit: Payer: Self-pay | Admitting: Physician Assistant

## 2021-04-11 DIAGNOSIS — I10 Essential (primary) hypertension: Secondary | ICD-10-CM

## 2021-04-16 ENCOUNTER — Other Ambulatory Visit: Payer: Self-pay | Admitting: Physician Assistant

## 2021-04-16 DIAGNOSIS — E119 Type 2 diabetes mellitus without complications: Secondary | ICD-10-CM

## 2021-04-23 ENCOUNTER — Ambulatory Visit (INDEPENDENT_AMBULATORY_CARE_PROVIDER_SITE_OTHER): Payer: Medicare Other

## 2021-04-23 DIAGNOSIS — E782 Mixed hyperlipidemia: Secondary | ICD-10-CM

## 2021-04-23 DIAGNOSIS — I10 Essential (primary) hypertension: Secondary | ICD-10-CM

## 2021-04-23 DIAGNOSIS — E119 Type 2 diabetes mellitus without complications: Secondary | ICD-10-CM

## 2021-04-23 NOTE — Chronic Care Management (AMB) (Signed)
Chronic Care Management   CCM RN Visit Note  04/23/2021 Name: Bonnie Torres MRN: [id number] DOB: Apr 30, 1956  Subjective: Bonnie Torres is a 65 y.o. year old female who is a primary care patient of Donella Stade, PA-C. The care management team was consulted for assistance with disease management and care coordination needs.    Engaged with patient by telephone for follow up visit in response to provider referral for case management and/or care coordination services.   Consent to Services:  The patient was given information about Chronic Care Management services, agreed to services, and gave verbal consent prior to initiation of services.  Please see initial visit note for detailed documentation.   Patient agreed to services and verbal consent obtained.   Assessment: Review of patient past medical history, allergies, medications, health status, including review of consultants reports, laboratory and other test data, was performed as part of comprehensive evaluation and provision of chronic care management services.   SDOH (Social Determinants of Health) assessments and interventions performed:    CCM Care Plan  No Known Allergies  Outpatient Encounter Medications as of 04/23/2021  Medication Sig Note   atorvastatin (LIPITOR) 40 MG tablet TAKE 1 TABLET BY MOUTH  DAILY    cyclobenzaprine (FLEXERIL) 10 MG tablet TAKE 1 TABLET BY MOUTH 3  TIMES DAILY AS NEEDED FOR  MUSCLE SPASM(S)    diclofenac Sodium (VOLTAREN) 1 % GEL APPLY 4 GRAMS TOPICALLY 4  TIMES DAILY TO LOWER  EXTREMITY JOINT    metFORMIN (GLUCOPHAGE-XR) 750 MG 24 hr tablet Take 1 tablet (750 mg total) by mouth daily with breakfast. NEEDS APPT    metoprolol succinate (TOPROL-XL) 100 MG 24 hr tablet Take 1 tablet (100 mg total) by mouth daily. appt for refills    Olmesartan-amLODIPine-HCTZ 20-5-12.5 MG TABS TAKE 1 TABLET BY MOUTH  DAILY    tretinoin (RETIN-A) 0.05 % cream Apply 1 application topically at bedtime.      zolpidem (AMBIEN) 10 MG tablet Take 1 tablet (10 mg total) by mouth at bedtime.    Blood Glucose Monitoring Suppl ([place] FLEX SYSTEM) w/Device KIT Check fasting blood sugar every morning. DM ICD10 E11.9    clindamycin (CLEOCIN T) 1 % external solution Apply 1 application topically 2 (two) times daily.    Lancets (ONETOUCH DELICA PLUS [id number]) MISC USE TO CHECK BLOOD SUGAR  TWICE DAILY    ONETOUCH VERIO test strip CHECK BLOOD GLUCOSE TWICE  DAILY AS DIRECTED AS NEEDED    potassium chloride SA (KLOR-CON) 20 MEQ tablet Take 1 tablet (20 mEq total) by mouth once for 1 dose. Patient needs appointment. 03/26/2021: Reports she is taking one 20 meq tablet once a day.   No facility-administered encounter medications on file as of 04/23/2021.    Patient Active Problem List   Diagnosis Date Noted   DDD (degenerative disc disease), lumbar 11/12/2020   Chronic kidney disease (CKD), stage III (moderate) ([place]) 03/12/2020   Bilateral plantar fasciitis 01/29/2020   neoplasm left kidney 01/29/2020   Hx of partial nephrectomy 01/29/2020   Neoplasm of left kidney 11/30/2019   Cubital tunnel syndrome, left 11/20/2019   Personal history of gout 01/25/2019   Acute gout of right foot 01/25/2019   Elevated uric acid in blood 01/25/2019   Mid back pain on left side 07/29/2018   Irregular heart rate 04/06/2018   Hypotension 04/06/2018   Elevated serum creatinine 12/07/2017   Type 2 diabetes mellitus without complication, without long-term current use of insulin ([place]) 12/03/2017  Class 1 obesity due to excess calories without serious comorbidity with body mass index (BMI) of 33.0 to 33.9 in adult 12/03/2017   Trigger middle finger of left hand 12/01/2017   Anterior cervical adenopathy 05/19/2017   Hypokalemia 04/02/2017   Obesity (BMI 30.0-34.9) 09/16/2016   Hot flashes due to surgical menopause 08/19/2016   Lymphangioma 06/18/2016   Uterine mass 04/27/2016   Mass of kidney 04/05/2016   Elevated  testosterone level in female 03/25/2016   Breast calcification, right 11/13/2015   Primary gout 10/16/2015   Bilateral low back pain without sciatica 04/17/2015   Hyperlipidemia 09/28/2014   Insomnia 08/31/2014   Essential hypertension, benign 08/31/2014    Conditions to be addressed/monitored:HTN, HLD, DMII, and CKD Stage III  Care Plan : Chronic disease  Updates made by Bonnie Rued, RN since 04/23/2021 12:00 AM     Problem: Long term care plan for self managment of cadiovascular disease in a patient with CKD stage III and DM   Priority: Medium     Long-Range Goal: Disease Progression Prevented or Minimized   Start Date: 01/22/2021  Expected End Date: 07/25/2021  This Visit's Progress: On track  Recent Progress: On track  Priority: Medium  Note:   Objective:  Last practice recorded BP readings:  BP Readings from Last 3 Encounters:  11/05/20 119/67  05/08/20 102/65  04/18/20 94/60       Component Value Date/Time   CHOL 170 03/11/2020 1457   TRIG 90 03/11/2020 1457   HDL 48 (L) 03/11/2020 1457   CHOLHDL 3.5 03/11/2020 1457   VLDL 29 10/14/2015 1028   LDLCALC 104 (H) 03/11/2020 1457  Objective:  Lab Results  Component Value Date   HGBA1C 6.0 (A) 11/05/2020   Lab Results  Component Value Date   CREATININE 1.35 (H) 11/05/2020   CREATININE 1.16 (H) 03/11/2020   CREATININE 0.73 12/05/2019  Current Barriers:  Knowledge Deficits related to long term care plan for self health management of HTN, HLD, DM in a patient with history of CKD III and history of bilateral renal carcinoma. Has not check blood pressure in a couple of weeks, but last checked at Dentist office was 123/67 on 04/08/21. Bonnie Torres reports she is not checking blood sugar daily, but states blood sugar last checked was 87 and reports blood sugars have been ranging 83-97. She denies signs/symptoms of hypoglycemia or hyperglycemia. She denies any issues or concerns at this time.     Does not routinely  check blood sugars-improved   Case Manager Clinical Goal(s):  patient will demonstrate improved adherence to prescribed treatment plan for diabetes self care/management as evidenced by: improvement in monitoring and recording of CBG; adherence to ADA/ carb modified diet; adherence to prescribed medication regimen; contacting provider for new or worsened symptoms or questions patient will work with Consulting civil engineer, providers, and care team towards execution of optimized self-health management plan Interventions:  Collaboration with Donella Stade, PA-C regarding development and update of comprehensive plan of care as evidenced by provider attestation and co-signature Inter-disciplinary care team collaboration (see longitudinal plan of care) Reviewed medications with patient.   Discussed Lipid goals with patient and encouraged patient to continue to eat healthy: vegetables, low fat, lean meats, minimize starchy foods like potatoes, rice, bread Discussed upcoming provider appointments. Bonnie Torres states she has an appointment with eye doctor, Dr. Sanjuana Kava on 04/28/21.  Discussed plans with patient for ongoing care management follow up and provided patient with direct contact information for care management  team. Bonnie Torres and RNCM in agreement with follow up from RN Case Manager in 2 months. Self-Care Activities - Self administers oral medications as prescribed Attends all scheduled provider appointments Patient Goals: - check blood sugars at least 2-3 times/week and check blood sugar if you feel it is too high or too low - take the blood sugar log/meter to all doctor visits - check blood pressure at least 2-3 times/month and notify provider if blood pressure is consistently outside of recommended parameters. - continue to take medications as prescribed. - continue to attend provider visits as recommended. - continue to eat healthy meals: fruits, vegetables, lean meats, healthy fats, low  salt. Manage portion size - continue to remain active/exercise per provider recommendations.  - contact your RN Case Manager as needed   Plan:Telephone follow up appointment with care management team member scheduled for:  2 months The patient has been provided with contact information for the care management team and has been advised to call with any health related questions or concerns.   Thea Silversmith, RN, MSN, BSN, CCM Care Management Coordinator [facility] [phone]

## 2021-04-23 NOTE — Patient Instructions (Signed)
Visit Information: Thank you for taking the time to talk with me today. Below you will find goals we have discussed today.  PATIENT GOALS:  Goals Addressed             This Visit's Progress    Track and Manage My Health Conditions   On track    Timeframe:  Long-Range Goal Priority:  Medium Start Date: 01/22/21                            Expected End Date: 07/25/21                      Follow Up Date 06/04/21    Patient Goals: - continue to  check blood sugars at least 2-3 times/week and check blood sugar if you feel it is too high or too low - take the blood sugar log/meter to all doctor visits - check blood pressure weekly and notify provider if blood pressure is consistently outside of recommended parameters. - continue to take medications as prescribed. - continue to attend provider visits as recommended. - continue to eat healthy meals: fruits, vegetables, lean meats, healthy fats, low salt. Manage portion size - continue to remain active/exercise per provider recommendations.        Cholesterol Content in Foods Cholesterol is a waxy, fat-like substance that helps to carry fat in the blood. The body needs cholesterol in small amounts, but too much cholesterol can cause damage to the arteries and heart. Most people should eat less than 200 milligrams (mg) of cholesterol a day. Foods with cholesterol Cholesterol is found in animal-based foods, such as meat, seafood, and dairy. Generally, low-fat dairy and lean meats have less cholesterol than full-fat dairy and fatty meats. The milligrams of cholesterol per serving (mg per serving) of common cholesterol-containing foods are listed below. Meat and other proteins Egg -- one large whole egg has 186 mg. Veal shank -- 4 oz has 141 mg. Lean ground [place] (93% lean) -- 4 oz has 118 mg. Fat-trimmed lamb loin -- 4 oz has 106 mg. Lean ground beef (90% lean) -- 4 oz has 100 mg. Lobster -- 3.5 oz has 90 mg. Pork loin chops -- 4 oz has 86  mg. Canned salmon -- 3.5 oz has 83 mg. Fat-trimmed beef top loin -- 4 oz has 78 mg. Frankfurter -- 1 frank (3.5 oz) has 77 mg. Crab -- 3.5 oz has 71 mg. Roasted chicken without skin, white meat -- 4 oz has 66 mg. Light bologna -- 2 oz has 45 mg. Deli-cut [place] -- 2 oz has 31 mg. Canned tuna -- 3.5 oz has 31 mg. Berniece Salines -- 1 oz has 29 mg. Oysters and mussels (raw) -- 3.5 oz has 25 mg. Mackerel -- 1 oz has 22 mg. Trout -- 1 oz has 20 mg. Pork sausage -- 1 link (1 oz) has 17 mg. Salmon -- 1 oz has 16 mg. Tilapia -- 1 oz has 14 mg. Dairy Soft-serve ice cream --  cup (4 oz) has 103 mg. Whole-milk yogurt -- 1 cup (8 oz) has 29 mg. Cheddar cheese -- 1 oz has 28 mg. American cheese -- 1 oz has 28 mg. Whole milk -- 1 cup (8 oz) has 23 mg. 2% milk -- 1 cup (8 oz) has 18 mg. Cream cheese -- 1 tablespoon (Tbsp) has 15 mg. Cottage cheese --  cup (4 oz) has 14 mg. Low-fat (1%) milk --  1 cup (8 oz) has 10 mg. Sour cream -- 1 Tbsp has 8.5 mg. Low-fat yogurt -- 1 cup (8 oz) has 8 mg. Nonfat Greek yogurt -- 1 cup (8 oz) has 7 mg. Half-and-half cream -- 1 Tbsp has 5 mg. Fats and oils Cod liver oil -- 1 tablespoon (Tbsp) has 82 mg. Butter -- 1 Tbsp has 15 mg. Lard -- 1 Tbsp has 14 mg. Bacon grease -- 1 Tbsp has 14 mg. Mayonnaise -- 1 Tbsp has 5-10 mg. Margarine -- 1 Tbsp has 3-10 mg. Exact amounts of cholesterol in these foods may vary depending on specific ingredients and brands. Foods without cholesterol Most plant-based foods do not have cholesterol unless you combine them with a food that has cholesterol. Foods without cholesterol include: Grains and cereals. Vegetables. Fruits. Vegetable oils, such as olive, canola, and sunflower oil. Legumes, such as peas, beans, and lentils. Nuts and seeds. Egg whites. Summary The body needs cholesterol in small amounts, but too much cholesterol can cause damage to the arteries and heart. Most people should eat less than 200 milligrams (mg) of  cholesterol a day. This information is not intended to replace advice given to you by your health care provider. Make sure you discuss any questions you have with your health care provider. Document Revised: 09/26/2019 Document Reviewed: 11/06/2019 Elsevier Patient Education  2022 [place].   Patient verbalizes understanding of instructions provided today and agrees to view in [place].   Telephone follow up appointment with care management team member scheduled for: 06/04/21 The patient has been provided with contact information for the care management team and has been advised to call with any health related questions or concerns.    DASH Eating Plan DASH stands for Dietary Approaches to Stop Hypertension. The DASH eating plan is a healthy eating plan that has been shown to: Reduce high blood pressure (hypertension). Reduce your risk for type 2 diabetes, heart disease, and stroke. Help with weight loss. What are tips for following this plan? Reading food labels Check food labels for the amount of salt (sodium) per serving. Choose foods with less than 5 percent of the Daily Value of sodium. Generally, foods with less than 300 milligrams (mg) of sodium per serving fit into this eating plan. To find whole grains, look for the word "whole" as the first word in the ingredient list. Shopping Buy products labeled as "low-sodium" or "no salt added." Buy fresh foods. Avoid canned foods and pre-made or frozen meals. Cooking Avoid adding salt when cooking. Use salt-free seasonings or herbs instead of table salt or sea salt. Check with your health care provider or pharmacist before using salt substitutes. Do not fry foods. Cook foods using healthy methods such as baking, boiling, grilling, roasting, and broiling instead. Cook with heart-healthy oils, such as olive, canola, avocado, soybean, or sunflower oil. Meal planning  Eat a balanced diet that includes: 4 or more servings of fruits and 4  or more servings of vegetables each day. Try to fill one-half of your plate with fruits and vegetables. 6-8 servings of whole grains each day. Less than 6 oz (170 g) of lean meat, poultry, or fish each day. A 3-oz (85-g) serving of meat is about the same size as a deck of cards. One egg equals 1 oz (28 g). 2-3 servings of low-fat dairy each day. One serving is 1 cup (237 mL). 1 serving of nuts, seeds, or beans 5 times each week. 2-3 servings of heart-healthy fats. Healthy fats called  omega-3 fatty acids are found in foods such as walnuts, flaxseeds, fortified milks, and eggs. These fats are also found in cold-water fish, such as sardines, salmon, and mackerel. Limit how much you eat of: Canned or prepackaged foods. Food that is high in trans fat, such as some fried foods. Food that is high in saturated fat, such as fatty meat. Desserts and other sweets, sugary drinks, and other foods with added sugar. Full-fat dairy products. Do not salt foods before eating. Do not eat more than 4 egg yolks a week. Try to eat at least 2 vegetarian meals a week. Eat more home-cooked food and less restaurant, buffet, and fast food. Lifestyle When eating at a restaurant, ask that your food be prepared with less salt or no salt, if possible. If you drink alcohol: Limit how much you use to: 0-1 drink a day for women who are not pregnant. 0-2 drinks a day for men. Be aware of how much alcohol is in your drink. In the [place] one drink equals one 12 oz bottle of beer (355 mL), one 5 oz glass of wine (148 mL), or one 1 oz glass of hard liquor (44 mL). General information Avoid eating more than 2,300 mg of salt a day. If you have hypertension, you may need to reduce your sodium intake to 1,500 mg a day. Work with your health care provider to maintain a healthy body weight or to lose weight. Ask what an ideal weight is for you. Get at least 30 minutes of exercise that causes your heart to beat faster (aerobic  exercise) most days of the week. Activities may include walking, swimming, or biking. Work with your health care provider or dietitian to adjust your eating plan to your individual calorie needs. What foods should I eat? Fruits All fresh, dried, or frozen fruit. Canned fruit in natural juice (without added sugar). Vegetables Fresh or frozen vegetables (raw, steamed, roasted, or grilled). Low-sodium or reduced-sodium tomato and vegetable juice. Low-sodium or reduced-sodium tomato sauce and tomato paste. Low-sodium or reduced-sodium canned vegetables. Grains Whole-grain or whole-wheat bread. Whole-grain or whole-wheat pasta. Brown rice. [place]. Bulgur. Whole-grain and low-sodium cereals. Pita bread. Low-fat, low-sodium crackers. Whole-wheat flour tortillas. Meats and other proteins Skinless chicken or [place]. Ground chicken or [place]. Pork with fat trimmed off. Fish and seafood. Egg whites. Dried beans, peas, or lentils. Unsalted nuts, nut butters, and seeds. Unsalted canned beans. Lean cuts of beef with fat trimmed off. Low-sodium, lean precooked or cured meat, such as sausages or meat loaves. Dairy Low-fat (1%) or fat-free (skim) milk. Reduced-fat, low-fat, or fat-free cheeses. Nonfat, low-sodium ricotta or cottage cheese. Low-fat or nonfat yogurt. Low-fat, low-sodium cheese. Fats and oils Soft margarine without trans fats. Vegetable oil. Reduced-fat, low-fat, or light mayonnaise and salad dressings (reduced-sodium). Canola, safflower, olive, avocado, soybean, and sunflower oils. Avocado. Seasonings and condiments Herbs. Spices. Seasoning mixes without salt. Other foods Unsalted popcorn and pretzels. Fat-free sweets. The items listed above may not be a complete list of foods and beverages you can eat. Contact a dietitian for more information. What foods should I avoid? Fruits Canned fruit in a light or heavy syrup. Fried fruit. Fruit in cream or butter sauce. Vegetables Creamed or  fried vegetables. Vegetables in a cheese sauce. Regular canned vegetables (not low-sodium or reduced-sodium). Regular canned tomato sauce and paste (not low-sodium or reduced-sodium). Regular tomato and vegetable juice (not low-sodium or reduced-sodium). Angie Fava. Olives. Grains Baked goods made with fat, such as croissants, muffins, or some  breads. Dry pasta or rice meal packs. Meats and other proteins Fatty cuts of meat. Ribs. Fried meat. Berniece Salines. Bologna, salami, and other precooked or cured meats, such as sausages or meat loaves. Fat from the back of a pig (fatback). Bratwurst. Salted nuts and seeds. Canned beans with added salt. Canned or smoked fish. Whole eggs or egg yolks. Chicken or [place] with skin. Dairy Whole or 2% milk, cream, and half-and-half. Whole or full-fat cream cheese. Whole-fat or sweetened yogurt. Full-fat cheese. Nondairy creamers. Whipped toppings. Processed cheese and cheese spreads. Fats and oils Butter. Stick margarine. Lard. Shortening. Ghee. Bacon fat. Tropical oils, such as coconut, palm kernel, or palm oil. Seasonings and condiments Onion salt, garlic salt, seasoned salt, table salt, and sea salt. Worcestershire sauce. Tartar sauce. Barbecue sauce. Teriyaki sauce. Soy sauce, including reduced-sodium. Steak sauce. Canned and packaged gravies. Fish sauce. Oyster sauce. Cocktail sauce. Store-bought horseradish. Ketchup. Mustard. Meat flavorings and tenderizers. Bouillon cubes. Hot sauces. Pre-made or packaged marinades. Pre-made or packaged taco seasonings. Relishes. Regular salad dressings. Other foods Salted popcorn and pretzels. The items listed above may not be a complete list of foods and beverages you should avoid. Contact a dietitian for more information. Where to find more information National Heart, Lung, and Blood Institute: https://wilson-eaton.com/ American Heart Association: www.heart.org Academy of Nutrition and Dietetics: www.eatright.[place]:  www.kidney.org Summary The DASH eating plan is a healthy eating plan that has been shown to reduce high blood pressure (hypertension). It may also reduce your risk for type 2 diabetes, heart disease, and stroke. When on the DASH eating plan, aim to eat more fresh fruits and vegetables, whole grains, lean proteins, low-fat dairy, and heart-healthy fats. With the DASH eating plan, you should limit salt (sodium) intake to 2,300 mg a day. If you have hypertension, you may need to reduce your sodium intake to 1,500 mg a day. Work with your health care provider or dietitian to adjust your eating plan to your individual calorie needs. This information is not intended to replace advice given to you by your health care provider. Make sure you discuss any questions you have with your health care provider. Document Revised: 05/19/2019 Document Reviewed: 05/19/2019 Elsevier Patient Education  2022 [place], RN, MSN, BSN, [facility] Care Management Coordinator [facility] [phone]

## 2021-04-28 DIAGNOSIS — H25013 Cortical age-related cataract, bilateral: Secondary | ICD-10-CM | POA: Diagnosis not present

## 2021-04-28 DIAGNOSIS — E119 Type 2 diabetes mellitus without complications: Secondary | ICD-10-CM

## 2021-04-28 DIAGNOSIS — Z7984 Long term (current) use of oral hypoglycemic drugs: Secondary | ICD-10-CM | POA: Diagnosis not present

## 2021-04-28 DIAGNOSIS — E782 Mixed hyperlipidemia: Secondary | ICD-10-CM | POA: Diagnosis not present

## 2021-04-28 DIAGNOSIS — I10 Essential (primary) hypertension: Secondary | ICD-10-CM | POA: Diagnosis not present

## 2021-04-28 LAB — HM DIABETES EYE EXAM

## 2021-05-29 ENCOUNTER — Other Ambulatory Visit: Payer: Self-pay | Admitting: Physician Assistant

## 2021-05-29 DIAGNOSIS — E119 Type 2 diabetes mellitus without complications: Secondary | ICD-10-CM

## 2021-06-04 ENCOUNTER — Ambulatory Visit: Payer: Medicare Other

## 2021-06-04 DIAGNOSIS — E119 Type 2 diabetes mellitus without complications: Secondary | ICD-10-CM

## 2021-06-04 DIAGNOSIS — I10 Essential (primary) hypertension: Secondary | ICD-10-CM

## 2021-06-04 NOTE — Chronic Care Management (AMB) (Signed)
  Care Management   Follow Up Note   06/04/2021 Name: Bonnie Torres MRN: [id number] DOB: 1955/12/01   Referred by: Donella Stade, PA-C Reason for referral : Chronic Care Management (RNCM follow up)   RNCM called to completed telephone assessment. Patient states she was unaware of telephone assessment call today and request to reschedule.   Follow Up Plan: Telephone follow up appointment with care management team member scheduled for: 06/11/21.  Thea Silversmith, RN, MSN, BSN, CCM Care Management Coordinator [facility] [phone]

## 2021-06-11 ENCOUNTER — Ambulatory Visit (INDEPENDENT_AMBULATORY_CARE_PROVIDER_SITE_OTHER): Payer: Medicare Other

## 2021-06-11 DIAGNOSIS — L700 Acne vulgaris: Secondary | ICD-10-CM | POA: Diagnosis not present

## 2021-06-11 DIAGNOSIS — E119 Type 2 diabetes mellitus without complications: Secondary | ICD-10-CM

## 2021-06-11 DIAGNOSIS — I10 Essential (primary) hypertension: Secondary | ICD-10-CM

## 2021-06-11 NOTE — Chronic Care Management (AMB) (Signed)
Chronic Care Management   CCM RN Visit Note  06/11/2021 Name: Bonnie Torres MRN: [id number] DOB: December 06, 1955  Subjective: Bonnie Torres is a 65 y.o. year old female who is a primary care patient of Donella Stade, PA-C. The care management team was consulted for assistance with disease management and care coordination needs.    Engaged with patient by telephone for follow up visit in response to provider referral for case management and/or care coordination services.   Consent to Services:  The patient was given information about Chronic Care Management services, agreed to services, and gave verbal consent prior to initiation of services.  Please see initial visit note for detailed documentation.   Patient agreed to services and verbal consent obtained.   Assessment: Review of patient past medical history, allergies, medications, health status, including review of consultants reports, laboratory and other test data, was performed as part of comprehensive evaluation and provision of chronic care management services.   SDOH (Social Determinants of Health) assessments and interventions performed:    CCM Care Plan  No Known Allergies  Outpatient Encounter Medications as of 06/11/2021  Medication Sig Note   atorvastatin (LIPITOR) 40 MG tablet TAKE 1 TABLET BY MOUTH  DAILY    cyclobenzaprine (FLEXERIL) 10 MG tablet TAKE 1 TABLET BY MOUTH 3  TIMES DAILY AS NEEDED FOR  MUSCLE SPASM(S)    diclofenac Sodium (VOLTAREN) 1 % GEL APPLY 4 GRAMS TOPICALLY 4  TIMES DAILY TO LOWER  EXTREMITY JOINT    metFORMIN (GLUCOPHAGE-XR) 750 MG 24 hr tablet Take 1 tablet (750 mg total) by mouth daily with breakfast. **NO MORE REFILLS UNTIL PATIENT COMES IN FOR AN OFFICE VISIT**    metoprolol succinate (TOPROL-XL) 100 MG 24 hr tablet Take 1 tablet (100 mg total) by mouth daily. appt for refills    Olmesartan-amLODIPine-HCTZ 20-5-12.5 MG TABS TAKE 1 TABLET BY MOUTH  DAILY    tretinoin (RETIN-A) 0.05 % cream  Apply 1 application topically at bedtime.     zolpidem (AMBIEN) 10 MG tablet Take 1 tablet (10 mg total) by mouth at bedtime.    Blood Glucose Monitoring Suppl ([place] FLEX SYSTEM) w/Device KIT Check fasting blood sugar every morning. DM ICD10 E11.9    clindamycin (CLEOCIN T) 1 % external solution Apply 1 application topically 2 (two) times daily.    Lancets (ONETOUCH DELICA PLUS [id number]) MISC USE TO CHECK BLOOD SUGAR  TWICE DAILY    ONETOUCH VERIO test strip CHECK BLOOD GLUCOSE TWICE  DAILY AS DIRECTED AS NEEDED    potassium chloride SA (KLOR-CON) 20 MEQ tablet Take 1 tablet (20 mEq total) by mouth once for 1 dose. Patient needs appointment. 03/26/2021: Reports she is taking one 20 meq tablet once a day.   No facility-administered encounter medications on file as of 06/11/2021.    Patient Active Problem List   Diagnosis Date Noted   DDD (degenerative disc disease), lumbar 11/12/2020   Chronic kidney disease (CKD), stage III (moderate) ([place]) 03/12/2020   Bilateral plantar fasciitis 01/29/2020   neoplasm left kidney 01/29/2020   Hx of partial nephrectomy 01/29/2020   Neoplasm of left kidney 11/30/2019   Cubital tunnel syndrome, left 11/20/2019   Personal history of gout 01/25/2019   Acute gout of right foot 01/25/2019   Elevated uric acid in blood 01/25/2019   Mid back pain on left side 07/29/2018   Irregular heart rate 04/06/2018   Hypotension 04/06/2018   Elevated serum creatinine 12/07/2017   Type 2 diabetes mellitus without  complication, without long-term current use of insulin ([place]) 12/03/2017   Class 1 obesity due to excess calories without serious comorbidity with body mass index (BMI) of 33.0 to 33.9 in adult 12/03/2017   Trigger middle finger of left hand 12/01/2017   Anterior cervical adenopathy 05/19/2017   Hypokalemia 04/02/2017   Obesity (BMI 30.0-34.9) 09/16/2016   Hot flashes due to surgical menopause 08/19/2016   Lymphangioma 06/18/2016   Uterine mass  04/27/2016   Mass of kidney 04/05/2016   Elevated testosterone level in female 03/25/2016   Breast calcification, right 11/13/2015   Primary gout 10/16/2015   Bilateral low back pain without sciatica 04/17/2015   Hyperlipidemia 09/28/2014   Insomnia 08/31/2014   Essential hypertension, benign 08/31/2014    Conditions to be addressed/monitored:HTN, HLD, DMII, and CKD Stage 3   Care Plan : RN Care Management Plan of Care  Updates made by Luretha Rued, RN since 06/11/2021 12:00 AM     Problem: Chronic Disease Managment Education and/or Care Coordination needs   Priority: High     Long-Range Goal: Development of Plan of Care for Chronic Disease Managment and/or Care Coordination needs.   Start Date: 06/11/2021  Expected End Date: 09/09/2021  Priority: High  Note:   Current Barriers: Bonnie Torres reports she has an office visit with her dermatologist later today. She also reports an upcoming appointment with her Kidney doctor. She reports her blood sugars have been ranging 88-97. She denies any hypoglycemic episodes. She also reports blood pressure readings have been ranging 102/63-130/88 and that she has a new wrist blood pressure monitor. 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 She denies any questions or concerns at this time. Knowledge Deficits related to plan of care for management of HTN, HLD, DMII, and CKD Stage 3  Chronic Disease Management support and education needs related to HTN, HLD, DMII, and CKD Stage 3  RNCM Clinical Goal(s):  Patient will verbalize understanding of plan for management of HTN, HLD, DMII, and CKD Stage 3 as evidenced by self report and/or chart notiation demonstrate ongoing adherence to prescribed treatment plan for HTN, HLD, DMII, and CKD Stage 3 as evidenced by attending provider visits as scheduled, taking medications, following up with provider with questions or concerns  through collaboration with RN  Care manager, provider, and care team.   Interventions: 1:1 collaboration with primary care provider regarding development and update of comprehensive plan of care as evidenced by provider attestation and co-signature Inter-disciplinary care team collaboration (see longitudinal plan of care) Evaluation of current treatment plan related to  self management and patient's adherence to plan as established by provider   HTN/Chronic Kidney Disease Interventions:  (Status:  Goal on track:  Yes.) Long Term Goal Evaluation of current treatment plan related to chronic kidney disease self management and patient's adherence to plan as established by provider      Reviewed upcoming appointment Reviewed with Bonnie Torres the how to get the most accurate reading with her wrist blood pressure monitor. Last practice recorded BP readings:  BP Readings from Last 3 Encounters:  11/05/20 119/67  05/08/20 102/65  04/18/20 94/60  Most recent eGFR/CrCl: No results found for: EGFR  No components found for: CRCL  Diabetes Interventions:  (Status:  Goal on track:  Yes.) Long Term Goal Assessed patient's understanding of A1c goal: <7% Reviewed medications with patient and discussed importance of medication adherence Discussed blood sugar reading, encouraged patient to continue to check, record  and notify provider for results outside normal range for patient Lab Results  Component Value Date   HGBA1C 6.0 (A) 11/05/2020  Hyperlipidemia Interventions:  (Status:  Goal on track:  Yes.) Long Term Goal Medication review performed; medication list updated in electronic medical record.  Reviewed importance of limiting foods high in cholesterol Encouraged to schedule follow up with primary care provider   Patient Goals/Self-Care Activities: Take medications as prescribed   check blood sugars at least 2-3 times/week and check blood sugar if you feel it is too high or too low. Take blood sugar log/meter to all provider  visits check blood pressure at least 2-3 times/month and notify provider if blood pressure is consistently outside of recommended parameters continue to remain active/exercise per provider recommendations continue to attend provider visits as recommended. continue to eat healthy meals: fruits, vegetables, lean meats, healthy fats, low salt. Manage portion size continue to remain active/exercise per provider recommendations.  You do not have an appointment scheduled with your primary care provider. Please call to arrange a follow up appointment.   Plan:Telephone follow up appointment with care management team member scheduled for:  07/23/21 The patient has been provided with contact information for the care management team and has been advised to call with any health related questions or concerns.   Thea Silversmith, RN, MSN, BSN, CCM Care Management Coordinator [facility] [phone]

## 2021-06-11 NOTE — Patient Instructions (Addendum)
Visit Information  Thank you for taking time to visit with me today. Please don't hesitate to contact me if I can be of assistance to you before our next scheduled telephone appointment.  Following are the goals we discussed today:  Patient Goals/Self-Care Activities: Take medications as prescribed   check blood sugars at least 2-3 times/week and check blood sugar if you feel it is too high or too low. Take blood sugar log/meter to all provider visits check blood pressure at least 2-3 times/month and notify provider if blood pressure is consistently outside of recommended parameters continue to remain active/exercise per provider recommendations continue to attend provider visits as recommended. continue to eat healthy meals: fruits, vegetables, lean meats, healthy fats, low salt. Manage portion size continue to remain active/exercise per provider recommendations.  You do not have an appointment scheduled with your primary care provider. Please call to arrange a follow up appointment.  Our next appointment is by telephone on 07/23/21 at 3:00 pm  Please call the care guide team at [phone] if you need to cancel or reschedule your appointment.   If you are experiencing a Mental Health or [place] or need someone to talk to, please call the Suicide and Crisis Lifeline: 988 call 1-800-273-TALK (toll free, 24 hour hotline)   Patient verbalizes understanding of instructions provided today and agrees to view in [place].   Thea Silversmith, RN, MSN, BSN, CCM Care Management Coordinator [facility] [phone]

## 2021-06-17 ENCOUNTER — Other Ambulatory Visit: Payer: Self-pay

## 2021-06-17 ENCOUNTER — Ambulatory Visit (INDEPENDENT_AMBULATORY_CARE_PROVIDER_SITE_OTHER): Payer: Medicare Other | Admitting: Physician Assistant

## 2021-06-17 ENCOUNTER — Encounter: Payer: Self-pay | Admitting: Physician Assistant

## 2021-06-17 VITALS — BP 120/69 | HR 74 | Ht 58.25 in | Wt 157.0 lb

## 2021-06-17 DIAGNOSIS — E119 Type 2 diabetes mellitus without complications: Secondary | ICD-10-CM

## 2021-06-17 DIAGNOSIS — N1832 Chronic kidney disease, stage 3b: Secondary | ICD-10-CM

## 2021-06-17 DIAGNOSIS — F5101 Primary insomnia: Secondary | ICD-10-CM | POA: Diagnosis not present

## 2021-06-17 DIAGNOSIS — E782 Mixed hyperlipidemia: Secondary | ICD-10-CM

## 2021-06-17 LAB — LIPID PANEL W/REFLEX DIRECT LDL
Cholesterol: 156 mg/dL (ref <200)
HDL: 45 mg/dL — ABNORMAL LOW (ref >=50)
LDL Cholesterol (Calc): 94 mg/dL (calc)
Non-HDL Cholesterol (Calc): 111 mg/dL (calc) (ref <130)
Total CHOL/HDL Ratio: 3.5 (calc) (ref <5.0)
Triglycerides: 84 mg/dL (ref <150)

## 2021-06-17 LAB — POCT GLYCOSYLATED HEMOGLOBIN (HGB A1C): Hemoglobin A1C: 5.8 % — AB (ref 4.0–5.6)

## 2021-06-17 LAB — COMPLETE METABOLIC PANEL WITH GFR
AG Ratio: 1.6 (calc) (ref 1.0–2.5)
ALT: 12 U/L (ref 6–29)
AST: 14 U/L (ref 10–35)
Albumin: 4.6 g/dL (ref 3.6–5.1)
Alkaline phosphatase (APISO): 72 U/L (ref 37–153)
BUN: 17 mg/dL (ref 7–25)
CO2: 24 mmol/L (ref 20–32)
Calcium: 10.2 mg/dL (ref 8.6–10.4)
Chloride: 105 mmol/L (ref 98–110)
Creat: 0.92 mg/dL (ref 0.50–1.05)
Globulin: 2.9 g/dL (calc) (ref 1.9–3.7)
Glucose, Bld: 110 mg/dL — ABNORMAL HIGH (ref 65–99)
Potassium: 4.1 mmol/L (ref 3.5–5.3)
Sodium: 139 mmol/L (ref 135–146)
Total Bilirubin: 0.6 mg/dL (ref 0.2–1.2)
Total Protein: 7.5 g/dL (ref 6.1–8.1)
eGFR: 70 mL/min/{1.73_m2} (ref >=60)

## 2021-06-17 MED ORDER — METFORMIN HCL ER 750 MG PO TB24: 750.0000 mg | ORAL_TABLET | Freq: Every day | ORAL | 1 refills | Status: DC

## 2021-06-17 MED ORDER — ZOLPIDEM TARTRATE 10 MG PO TABS: 10.0000 mg | ORAL_TABLET | Freq: Every day | ORAL | 1 refills | Status: DC

## 2021-06-17 NOTE — Progress Notes (Signed)
Subjective:    Patient ID: LASUNDRA HASCALL, female    DOB: 09-30-1955, 65 y.o.   MRN: [id number]  HPI Pt is a 65 yo female with T2DM, HTN, HLD, insomnia, CKD 3 who presents to the clinic for medication refills and 6 month follow up.   Pt is doing well. She is not checking her sugars.  She denies any hypoglycemic events. She is taking metformin daily. Denies any CP, palpitations, headaches, or vision changes.   She is sleeping well with ambien and no concerns or complaints.   She is fasting today.   .. Active Ambulatory Problems    Diagnosis Date Noted   Insomnia 08/31/2014   Essential hypertension, benign 08/31/2014   Hyperlipidemia 09/28/2014   Bilateral low back pain without sciatica 04/17/2015   Primary gout 10/16/2015   Breast calcification, right 11/13/2015   Elevated testosterone level in female 03/25/2016   Mass of kidney 04/05/2016   Uterine mass 04/27/2016   Lymphangioma 06/18/2016   Hot flashes due to surgical menopause 08/19/2016   Obesity (BMI 30.0-34.9) 09/16/2016   Hypokalemia 04/02/2017   Anterior cervical adenopathy 05/19/2017   Trigger middle finger of left hand 12/01/2017   Type 2 diabetes mellitus without complication, without long-term current use of insulin ([place]) 12/03/2017   Class 1 obesity due to excess calories without serious comorbidity with body mass index (BMI) of 33.0 to 33.9 in adult 12/03/2017   Elevated serum creatinine 12/07/2017   Irregular heart rate 04/06/2018   Hypotension 04/06/2018   Mid back pain on left side 07/29/2018   Personal history of gout 01/25/2019   Acute gout of right foot 01/25/2019   Elevated uric acid in blood 01/25/2019   Cubital tunnel syndrome, left 11/20/2019   Neoplasm of left kidney 11/30/2019   Bilateral plantar fasciitis 01/29/2020   neoplasm left kidney 01/29/2020   Hx of partial nephrectomy 01/29/2020   Chronic kidney disease (CKD), stage III (moderate) ([place]) 03/12/2020   DDD (degenerative disc disease),  lumbar 11/12/2020   Resolved Ambulatory Problems    Diagnosis Date Noted   Mass of omentum 04/05/2016   Hirsutism 04/05/2016   Mass of ovary 04/05/2016   Renal cell carcinoma ([place]) 08/18/2016   Past Medical History:  Diagnosis Date   Complication of anesthesia    Gout    Heart rate fast    Hypertension    PONV (postoperative nausea and vomiting)    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     Objective:   Physical Exam Vitals reviewed.  Constitutional:      Appearance: Normal appearance.  HENT:     Head: Normocephalic.  Cardiovascular:     Rate and Rhythm: Normal rate and regular rhythm.     Pulses: Normal pulses.     Heart sounds: Murmur heard.  Pulmonary:     Effort: Pulmonary effort is normal.     Breath sounds: Normal breath sounds.  Neurological:     General: No focal deficit present.     Mental Status: She is alert and oriented to person, place, and time.  Psychiatric:        Mood and Affect: Mood normal.          Assessment & Plan:  Marland KitchenMarland KitchenFaith was seen today for follow-up.  Diagnoses and all orders for this visit:  Type 2 diabetes mellitus without complication, without long-term current use of insulin ([place]) -  POCT glycosylated hemoglobin (Hb A1C) -     metFORMIN (GLUCOPHAGE-XR) 750 MG 24 hr tablet; Take 1 tablet (750 mg total) by mouth daily with breakfast. -     COMPLETE METABOLIC PANEL WITH GFR  Primary insomnia -     zolpidem (AMBIEN) 10 MG tablet; Take 1 tablet (10 mg total) by mouth at bedtime.  Mixed hyperlipidemia -     Lipid Panel w/reflex Direct LDL  Stage 3b chronic kidney disease (HCC) -     COMPLETE METABOLIC PANEL WITH GFR   Needs fasting labs.  A1C looks great today.  Continue metformin.  On ACE. BP to goal.  On statin.  Foot and eye exam UTD.  Covid vaccine x4.  Flu shot UTD.  Shingles and pneumonia UTD.  Follow up in 6 months.   Ambien  refilled for 6 months.

## 2021-06-18 ENCOUNTER — Other Ambulatory Visit: Payer: Self-pay | Admitting: Neurology

## 2021-06-18 MED ORDER — ATORVASTATIN CALCIUM 80 MG PO TABS: 80.0000 mg | ORAL_TABLET | Freq: Every day | ORAL | 3 refills | Status: DC

## 2021-06-18 NOTE — Progress Notes (Signed)
Reya,   Kidney and liver look great.  Your LDL is not under 70. We need to increase your lipitor to 80mg  at next refill is that ok?

## 2021-06-28 DIAGNOSIS — E119 Type 2 diabetes mellitus without complications: Secondary | ICD-10-CM | POA: Diagnosis not present

## 2021-06-28 DIAGNOSIS — I10 Essential (primary) hypertension: Secondary | ICD-10-CM | POA: Diagnosis not present

## 2021-07-05 ENCOUNTER — Other Ambulatory Visit: Payer: Self-pay | Admitting: Physician Assistant

## 2021-07-11 DIAGNOSIS — N200 Calculus of kidney: Secondary | ICD-10-CM | POA: Diagnosis not present

## 2021-07-11 DIAGNOSIS — C642 Malignant neoplasm of left kidney, except renal pelvis: Secondary | ICD-10-CM | POA: Diagnosis not present

## 2021-07-11 DIAGNOSIS — C649 Malignant neoplasm of unspecified kidney, except renal pelvis: Secondary | ICD-10-CM | POA: Diagnosis not present

## 2021-07-11 DIAGNOSIS — E042 Nontoxic multinodular goiter: Secondary | ICD-10-CM | POA: Diagnosis not present

## 2021-07-11 DIAGNOSIS — Z85528 Personal history of other malignant neoplasm of kidney: Secondary | ICD-10-CM | POA: Diagnosis not present

## 2021-07-18 DIAGNOSIS — Z85528 Personal history of other malignant neoplasm of kidney: Secondary | ICD-10-CM | POA: Diagnosis not present

## 2021-07-18 DIAGNOSIS — D49512 Neoplasm of unspecified behavior of left kidney: Secondary | ICD-10-CM | POA: Diagnosis not present

## 2021-07-21 ENCOUNTER — Ambulatory Visit (INDEPENDENT_AMBULATORY_CARE_PROVIDER_SITE_OTHER): Payer: Medicare Other | Admitting: Physician Assistant

## 2021-07-21 ENCOUNTER — Other Ambulatory Visit: Payer: Self-pay

## 2021-07-21 VITALS — BP 105/65 | HR 72 | Ht 58.4 in | Wt 160.0 lb

## 2021-07-21 DIAGNOSIS — Z0000 Encounter for general adult medical examination without abnormal findings: Secondary | ICD-10-CM

## 2021-07-21 DIAGNOSIS — Z1231 Encounter for screening mammogram for malignant neoplasm of breast: Secondary | ICD-10-CM

## 2021-07-21 NOTE — Progress Notes (Signed)
MEDICARE ANNUAL WELLNESS VISIT  07/21/2021  Subjective:  Bonnie Torres is a 66 y.o. female patient of Bonnie Torres, Bonnie Car, PA-C who had a [employer] Visit today. Bonnie Torres is Working part time and lives with their daughter. she has 1 child. she reports that she is socially active and does interact with friends/family regularly. she is moderately physically active and enjoys watching television and spending time with her daughter & mother.  Patient Care Team: Lavada Mesi as PCP - General (Family Medicine) Luretha Rued, RN as Case Manager  Advanced Directives 07/21/2021 04/18/2020 11/30/2019 11/20/2019 04/25/2019 04/04/2018 12/29/2017  Does Patient Have a Medical Advance Directive? _0  No No  Would patient like information on creating a medical advance directive? No - Patient declined Yes (MAU/Ambulatory/Procedural Areas - Information given) No - Patient declined No - Patient declined No - Patient declined Yes (MAU/Ambulatory/Procedural Areas - Information given) No - Patient declined    Hospital Utilization Over the Past 12 Months: # of hospitalizations or ER visits: 0 # of surgeries: 0  Review of Systems    Patient reports that her overall health is unchanged when compared to last year.  Review of Systems: History obtained from chart review and the patient  All other systems negative.  Pain Assessment Pain : No/denies pain     Current Medications & Allergies (verified) Allergies as of 07/21/2021   No Known Allergies      Medication List        Accurate as of July 21, 2021  3:29 PM. If you have any questions, ask your nurse or doctor.          atorvastatin 80 MG tablet Commonly known as: LIPITOR Take 1 tablet (80 mg total) by mouth daily.   clindamycin 1 % external solution Commonly known as: CLEOCIN T Apply 1 application topically 2 (two) times daily.   cyclobenzaprine 10 MG tablet Commonly known as: FLEXERIL TAKE 1 TABLET BY  MOUTH 3  TIMES DAILY AS NEEDED FOR  MUSCLE SPASM(S)   diclofenac Sodium 1 % Gel Commonly known as: VOLTAREN APPLY 4 GRAMS TOPICALLY 4  TIMES DAILY TO LOWER  EXTREMITY JOINT   hydroquinone 4 % cream Apply topically 2 (two) times daily.   metFORMIN 750 MG 24 hr tablet Commonly known as: GLUCOPHAGE-XR Take 1 tablet (750 mg total) by mouth daily with breakfast.   metoprolol succinate 100 MG 24 hr tablet Commonly known as: TOPROL-XL Take 1 tablet (100 mg total) by mouth daily. appt for refills   Olmesartan-amLODIPine-HCTZ 20-5-12.5 MG Tabs TAKE 1 TABLET BY MOUTH  DAILY   OneTouch Delica Plus [id number] Misc USE TO CHECK BLOOD SUGAR  TWICE DAILY   OneTouch Verio Flex System w/Device Kit Check fasting blood sugar every morning. DM ICD10 E11.9   OneTouch Verio test strip Generic drug: glucose blood CHECK BLOOD GLUCOSE TWICE  DAILY AS DIRECTED AS NEEDED   potassium chloride SA 20 MEQ tablet Commonly known as: KLOR-CON M Take 1 tablet (20 mEq total) by mouth once for 1 dose. Patient needs appointment.   tacrolimus 0.1 % ointment Commonly known as: PROTOPIC SMARTSIG:1 Topical Daily PRN   tretinoin 0.05 % cream Commonly known as: RETIN-A Apply 1 application topically at bedtime.   zolpidem 10 MG tablet Commonly known as: AMBIEN Take 1 tablet (10 mg total) by mouth at bedtime.        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   Type 2 diabetes mellitus without complication, without long-term current use of insulin ([place]) 12/03/2017   Past Surgical History:  Procedure Laterality Date   ABDOMINAL HYSTERECTOMY N/A 06/18/2016   Procedure: HYSTERECTOMY ABDOMINAL TOTAL;  Surgeon: Everitt Amber, MD;  Location: [facility];  Service: Gynecology;   Laterality: N/A;   BREAST EXCISIONAL BIOPSY Right    BREAST SURGERY Right    breast biopsy-benign   CHOLECYSTECTOMY     MASS EXCISION N/A 06/18/2016   Procedure: RESECTION RETROPERITONEAL MASS WITH RETORPERITONEAL EXPLORATION ,LIGATION CHYLI CISTERNA,OPEN  CHOLECYSTECTOMY WITH MOBILATION HEPATIC FLEXURE;  Surgeon: Hall Busing, MD;  Location: [facility];  Service: General;  Laterality: N/A;   PARTIAL NEPHRECTOMY Right 06/18/2016   Procedure: OPEN RIGHT NEPHRECTOMY PARTIAL;  Surgeon: Raynelle Bring, MD;  Location: [facility];  Service: Urology;  Laterality: Right;   ROBOTIC ASSITED PARTIAL NEPHRECTOMY Left 11/30/2019   Procedure: ATTEMPTED XI ROBOTIC ASSITED LAPAROSCOPIC CONVERTED TO OPEN PARTIAL NEPHRECTOMY;  Surgeon: Raynelle Bring, MD;  Location: [facility];  Service: Urology;  Laterality: Left;   SALPINGOOPHORECTOMY Bilateral 06/18/2016   Procedure: BILATERAL SALPINGO OOPHORECTOMY;  Surgeon: Everitt Amber, MD;  Location: [facility];  Service: Gynecology;  Laterality: Bilateral;   SHOULDER SURGERY Right 2010   TOE SURGERY Right Hammer Toe   2004   TONSILLECTOMY     Family History  Problem Relation Age of Onset   Heart attack Paternal Aunt    Hypertension Mother    Diabetes Maternal Aunt    Cancer Paternal Aunt        stomach   Cancer Sister    Breast cancer Other    Social History   Socioeconomic History   Marital status: Widowed    Spouse name: Not on file   Number of children: 1   Years of education: 25   Highest education level: 12th grade  Occupational History   Occupation: retired-semi    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 Semi retired- still working at  school. She has one child (daughter). She enjoys spending time with her daughter and her mom.    Social Determinants of Health   Financial Resource Strain: Low Risk    Difficulty of Paying Living Expenses: Not hard at all  Food Insecurity: No Food Insecurity   Worried About Charity fundraiser in the Last Year: Never true   [place] in the Last Year: Never true  Transportation Needs: No Transportation Needs   Lack of Transportation (Medical): No   Lack of Transportation (Non-Medical): No  Physical Activity: Sufficiently Active   Days of Exercise per Week: 3 days   Minutes of Exercise per Session: 60 min  Stress: No Stress Concern Present   Feeling of Stress : Not at all  Social Connections: Moderately Isolated   Frequency of Communication with Friends and Family: More than three times  a week   Frequency of Social Gatherings with Friends and Family: More than three times a week   Attends Religious Services: 1 to 4 times per year   Active Member of [employer] or Organizations: No   Attends Archivist Meetings: Never   Marital Status: Widowed    Activities of Daily Living In your present state of health, do you have any difficulty performing the following activities: 07/21/2021  Hearing? N  Vision? N  Difficulty concentrating or making decisions? N  Walking or climbing stairs? N  Dressing or bathing? N  Doing errands, shopping? N  Preparing Food and eating ? N  Using the Toilet? N  In the past six months, have you accidently leaked urine? N  Do you have problems with loss of bowel control? N  Managing your Medications? N  Managing your Finances? N  Housekeeping or managing your Housekeeping? N  Some recent data might be hidden    Patient Education/Literacy How often do you need to have someone help you when you read instructions, pamphlets, or other written materials from your doctor or pharmacy?: 1 - Never What is the last grade level you completed in school?:  12th grade  Exercise Current Exercise Habits: Home exercise routine, Type of exercise: walking, Time (Minutes): 60, Frequency (Times/Week): 3, Weekly Exercise (Minutes/Week): 180, Intensity: Moderate, Exercise limited by: None identified  Diet Patient reports consuming 2 meals a day and 1 snack(s) a day Patient reports that her primary diet is: Regular Patient reports that she does have regular access to food.   Depression Screen PHQ 2/9 Scores 07/21/2021 06/17/2021 05/08/2020 04/18/2020 04/25/2019 12/26/2018 06/28/2018  PHQ - 2 Score 2 0 0 0 0 0 0  PHQ- 9 Score 2 - 0 - - 0 0     Fall Risk Fall Risk  07/21/2021 06/17/2021 05/08/2020 04/18/2020 04/25/2019  Falls in the past year? 0 0 0 0 0  Number falls in past yr: 0 0 0 0 0  Injury with Fall? 0 0 0 0 0  Risk for fall due to : No Fall Risks No Fall Risks - - -  Follow up Falls evaluation completed Falls evaluation completed Falls evaluation completed Falls evaluation completed Falls prevention discussed     Objective:   BP 105/65 (BP Location: Right Arm, Patient Position: Sitting, Cuff Size: Normal)    Pulse 72    Ht 4' 10.4" (1.483 m)    Wt 160 lb 0.6 oz (72.6 kg)    SpO2 100%    BMI 32.99 kg/m   Last Weight  Most recent update: 07/21/2021  3:00 PM    Weight  72.6 kg (160 lb 0.6 oz)             Body mass index is 32.99 kg/m.  Hearing/Vision  Nayanna did not have difficulty with hearing/understanding during the face-to-face interview Iram did not have difficulty with her vision during the face-to-face interview Reports that she has had a formal eye exam by an eye care professional within the past year Reports that she has not had a formal hearing evaluation within the past year  Cognitive Function: 6CIT Screen 07/21/2021 05/08/2020 04/18/2020 04/25/2019 04/04/2018  What Year? 0 points 0 points 0 points 0 points 0 points  What month? 0 points 0 points 0 points 0 points 0 points  What time? 0 points 0 points 0 points 0 points  0 points  Count back from 20 0 points 0 points 0 points 0 points 0  points  Months in reverse 0 points 0 points 0 points 0 points 0 points  Repeat phrase 2 points 2 points 0 points 0 points 0 points  Total Score 2 2 0 0 0    Normal Cognitive Function Screening: Yes (Normal:0-7, Significant for Dysfunction: >8)  Immunization & Health Maintenance Record Immunization History  Administered Date(s) Administered   Influenza,inj,Quad PF,6+ Mos 04/15/2015, 03/23/2016, 03/31/2017, 03/07/2018, 04/05/2019, 04/18/2020   Influenza-Unspecified 04/28/2011, 05/29/2013, 04/27/2021   PFIZER(Purple Top)SARS-COV-2 Vaccination 09/28/2019, 10/26/2019, 06/23/2020, 04/02/2021   PPD Test 02/16/2018   Pneumococcal Polysaccharide-23 12/01/2017   Tdap 12/09/2010, 10/12/2014   Zoster Recombinat (Shingrix) 12/26/2018, 04/27/2021    Health Maintenance  Topic Date Due   COVID-19 Vaccine (5 - Booster for [place] series) 08/06/2021 (Originally 05/28/2021)   HEMOGLOBIN A1C  12/16/2021   OPHTHALMOLOGY EXAM  04/28/2022   FOOT EXAM  06/17/2022   MAMMOGRAM  08/07/2022   Fecal DNA (Cologuard)  05/28/2023   TETANUS/TDAP  10/11/2024   INFLUENZA VACCINE  Completed   Hepatitis C Screening  Completed   HIV Screening  Completed   Zoster Vaccines- Shingrix  Completed   HPV VACCINES  Aged Out       Assessment  This is a routine wellness examination for [employer].  Health Maintenance: Due or Overdue There are no preventive care reminders to display for this patient.   Ashlea R Wyre does not need a referral for Community Assistance: Care Management:   no Social Work:    no Prescription Assistance:  no Nutrition/Diabetes Education:  no   Plan:  Personalized Goals  Goals Addressed               This Visit's Progress     Patient Stated (pt-stated)        07/21/2021 AWV Goal: Exercise for General Health  Patient will verbalize understanding of the benefits of increased physical  activity: Exercising regularly is important. It will improve your overall fitness, flexibility, and endurance. Regular exercise also will improve your overall health. It can help you control your weight, reduce stress, and improve your bone density. Over the next year, patient will increase physical activity as tolerated with a goal of at least 150 minutes of moderate physical activity per week.  You can tell that you are exercising at a moderate intensity if your heart starts beating faster and you start breathing faster but can still hold a conversation. Moderate-intensity exercise ideas include: Walking 1 mile (1.6 km) in about 15 minutes Biking Hiking Golfing Dancing Water aerobics Patient will verbalize understanding of everyday activities that increase physical activity by providing examples like the following: Yard work, such as: Sales promotion account executive Gardening Washing windows or floors Patient will be able to explain general safety guidelines for exercising:  Before you start a new exercise program, talk with your health care provider. Do not exercise so much that you hurt yourself, feel dizzy, or get very short of breath. Wear comfortable clothes and wear shoes with good support. Drink plenty of water while you exercise to prevent dehydration or heat stroke. Work out until your breathing and your heartbeat get faster.        Personalized Health Maintenance & Screening Recommendations  Screening mammography due in Feb, 2023.  Lung Cancer Screening Recommended: no (Low Dose CT Chest recommended if Age 59-80 years, 30 pack-year currently smoking OR have quit w/in past 15 years) Hepatitis C Screening recommended:  no HIV Screening recommended: no  Advanced Directives: Written information was not given per the patient's request.  Referrals & Orders Orders Placed This Encounter  Procedures    Mammogram 3D SCREEN BREAST BILATERAL    Follow-up Plan Follow-up with Donella Stade, PA-C as planned Medicare wellness visit in one year.  AVS printed and given to the patient.   I have personally reviewed and noted the following in the patients chart:   Medical and social history Use of alcohol, tobacco or illicit drugs  Current medications and supplements Functional ability and status Nutritional status Physical activity Advanced directives List of other physicians Hospitalizations, surgeries, and ER visits in previous 12 months Vitals Screenings to include cognitive, depression, and falls Referrals and appointments  In addition, I have reviewed and discussed with patient certain preventive protocols, quality metrics, and best practice recommendations. A written personalized care plan for preventive services as well as general preventive health recommendations were provided to patient.     Tinnie Gens, RN  07/21/2021

## 2021-07-21 NOTE — Patient Instructions (Addendum)
Bonnie Torres Summary and Written Plan of Care  [name] ,  Thank you for allowing me to perform your Medicare Annual Wellness Visit and for your ongoing commitment to your health.   Health Torres & Immunization History Health Torres  Topic Date Due   COVID-19 Vaccine (5 - Booster for Pfizer series) 08/06/2021 (Originally 05/28/2021)   HEMOGLOBIN A1C  12/16/2021   OPHTHALMOLOGY EXAM  04/28/2022   FOOT EXAM  06/17/2022   MAMMOGRAM  08/07/2022   Fecal DNA (Cologuard)  05/28/2023   TETANUS/TDAP  10/11/2024   INFLUENZA VACCINE  Completed   Hepatitis C Screening  Completed   HIV Screening  Completed   Zoster Vaccines- Shingrix  Completed   HPV VACCINES  Aged Out   Immunization History  Administered Date(s) Administered   Influenza,inj,Quad PF,6+ Mos 04/15/2015, 03/23/2016, 03/31/2017, 03/07/2018, 04/05/2019, 04/18/2020   Influenza-Unspecified 04/28/2011, 05/29/2013, 04/27/2021   PFIZER(Purple Top)SARS-COV-2 Vaccination 09/28/2019, 10/26/2019, 06/23/2020, 04/02/2021   PPD Test 02/16/2018   Pneumococcal Polysaccharide-23 12/01/2017   Tdap 12/09/2010, 10/12/2014   Zoster Recombinat (Shingrix) 12/26/2018, 04/27/2021    These are the patient goals that we discussed:  Goals Addressed               This Visit's Progress     Patient Stated (pt-stated)        07/21/2021 AWV Goal: Exercise for General Health  Patient will verbalize understanding of the benefits of increased physical activity: Exercising regularly is important. It will improve your overall fitness, flexibility, and endurance. Regular exercise also will improve your overall health. It can help you control your weight, reduce stress, and improve your bone density. Over the next year, patient will increase physical activity as tolerated with a goal of at least 150 minutes of moderate physical activity per week.  You can tell that you are exercising at a moderate  intensity if your heart starts beating faster and you start breathing faster but can still hold a conversation. Moderate-intensity exercise ideas include: Walking 1 mile (1.6 km) in about 15 minutes Biking Hiking Golfing Dancing Water aerobics Patient will verbalize understanding of everyday activities that increase physical activity by providing examples like the following: Yard work, such as: Sales promotion account executive Gardening Washing windows or floors Patient will be able to explain general safety guidelines for exercising:  Before you start a new exercise program, talk with your health care provider. Do not exercise so much that you hurt yourself, feel dizzy, or get very short of breath. Wear comfortable clothes and wear shoes with good support. Drink plenty of water while you exercise to prevent dehydration or heat stroke. Work out until your breathing and your heartbeat get faster.          This is a list of Health Torres Items that are overdue or due now: Screening mammography due in Feb, 2023.    Orders/Referrals Placed Today: Orders Placed This Encounter  Procedures   Mammogram 3D SCREEN BREAST BILATERAL    Standing Status:   Future    Standing Expiration Date:   07/21/2022    Scheduling Instructions:     Patient will be due in Feb, 2023. Please call patient to schedule.    Order Specific Question:   Reason for Exam (SYMPTOM  OR DIAGNOSIS REQUIRED)    Answer:   Breast cancer screening    Order Specific Question:   Preferred imaging location?  Answer:   [facility]   (Contact our referral department at [phone] if you have not spoken with someone about your referral appointment within the next 5 days)    Follow-up Plan Follow-up with Donella Stade, PA-C as planned Medicare wellness visit in one year.  AVS printed and given to the patient.     Health  Torres, Female Adopting a healthy lifestyle and getting preventive care are important in promoting health and wellness. Ask your health care provider about: The right schedule for you to have regular tests and exams. Things you can do on your own to prevent diseases and keep yourself healthy. What should I know about diet, weight, and exercise? Eat a healthy diet  Eat a diet that includes plenty of vegetables, fruits, low-fat dairy products, and lean protein. Do not eat a lot of foods that are high in solid fats, added sugars, or sodium. Maintain a healthy weight Body mass index (BMI) is used to identify weight problems. It estimates body fat based on height and weight. Your health care provider can help determine your BMI and help you achieve or maintain a healthy weight. Get regular exercise Get regular exercise. This is one of the most important things you can do for your health. Most adults should: Exercise for at least 150 minutes each week. The exercise should increase your heart rate and make you sweat (moderate-intensity exercise). Do strengthening exercises at least twice a week. This is in addition to the moderate-intensity exercise. Spend less time sitting. Even light physical activity can be beneficial. Watch cholesterol and blood lipids Have your blood tested for lipids and cholesterol at 64 years of age, then have this test every 5 years. Have your cholesterol levels checked more often if: Your lipid or cholesterol levels are high. You are older than 64 years of age. You are at high risk for heart disease. What should I know about cancer screening? Depending on your health history and family history, you may need to have cancer screening at various ages. This may include screening for: Breast cancer. Cervical cancer. Colorectal cancer. Skin cancer. Lung cancer. What should I know about heart disease, diabetes, and high blood pressure? Blood pressure and heart  disease High blood pressure causes heart disease and increases the risk of stroke. This is more likely to develop in people who have high blood pressure readings or are overweight. Have your blood pressure checked: Every 3-5 years if you are 79-32 years of age. Every year if you are 72 years old or older. Diabetes Have regular diabetes screenings. This checks your fasting blood sugar level. Have the screening done: Once every three years after age 76 if you are at a normal weight and have a low risk for diabetes. More often and at a younger age if you are overweight or have a high risk for diabetes. What should I know about preventing infection? Hepatitis B If you have a higher risk for hepatitis B, you should be screened for this virus. Talk with your health care provider to find out if you are at risk for hepatitis B infection. Hepatitis C Testing is recommended for: Everyone born from [birth year] through 1963. Anyone with known risk factors for hepatitis C. Sexually transmitted infections (STIs) Get screened for STIs, including gonorrhea and chlamydia, if: You are sexually active and are younger than 64 years of age. You are older than 64 years of age and your health care provider tells you that you are at risk for  this type of infection. Your sexual activity has changed since you were last screened, and you are at increased risk for chlamydia or gonorrhea. Ask your health care provider if you are at risk. Ask your health care provider about whether you are at high risk for HIV. Your health care provider may recommend a prescription medicine to help prevent HIV infection. If you choose to take medicine to prevent HIV, you should first get tested for HIV. You should then be tested every 3 months for as long as you are taking the medicine. Pregnancy If you are about to stop having your period (premenopausal) and you may become pregnant, seek counseling before you get pregnant. Take 400 to 800  micrograms (mcg) of folic acid every day if you become pregnant. Ask for birth control (contraception) if you want to prevent pregnancy. Osteoporosis and menopause Osteoporosis is a disease in which the bones lose minerals and strength with aging. This can result in bone fractures. If you are 69 years old or older, or if you are at risk for osteoporosis and fractures, ask your health care provider if you should: Be screened for bone loss. Take a calcium or vitamin D supplement to lower your risk of fractures. Be given hormone replacement therapy (HRT) to treat symptoms of menopause. Follow these instructions at home: Alcohol use Do not drink alcohol if: Your health care provider tells you not to drink. You are pregnant, may be pregnant, or are planning to become pregnant. If you drink alcohol: Limit how much you have to: 0-1 drink a day. Know how much alcohol is in your drink. In the [place] one drink equals one 12 oz bottle of beer (355 mL), one 5 oz glass of wine (148 mL), or one 1 oz glass of hard liquor (44 mL). Lifestyle Do not use any products that contain nicotine or tobacco. These products include cigarettes, chewing tobacco, and vaping devices, such as e-cigarettes. If you need help quitting, ask your health care provider. Do not use street drugs. Do not share needles. Ask your health care provider for help if you need support or information about quitting drugs. General instructions Schedule regular health, dental, and eye exams. Stay current with your vaccines. Tell your health care provider if: You often feel depressed. You have ever been abused or do not feel safe at home. Summary Adopting a healthy lifestyle and getting preventive care are important in promoting health and wellness. Follow your health care provider's instructions about healthy diet, exercising, and getting tested or screened for diseases. Follow your health care provider's instructions on monitoring your  cholesterol and blood pressure. This information is not intended to replace advice given to you by your health care provider. Make sure you discuss any questions you have with your health care provider. Document Revised: 11/04/2020 Document Reviewed: 11/04/2020 Elsevier Patient Education  [place].

## 2021-07-23 ENCOUNTER — Ambulatory Visit (INDEPENDENT_AMBULATORY_CARE_PROVIDER_SITE_OTHER): Payer: Medicare Other

## 2021-07-23 DIAGNOSIS — I10 Essential (primary) hypertension: Secondary | ICD-10-CM

## 2021-07-23 DIAGNOSIS — E119 Type 2 diabetes mellitus without complications: Secondary | ICD-10-CM

## 2021-07-23 NOTE — Patient Instructions (Signed)
Visit Information  Thank you for taking time to visit with me today. Please don't hesitate to contact me if I can be of assistance to you before our next scheduled telephone appointment.  Following are the goals we discussed today:  Patient Goals/Self-Care Activities: Take medications as prescribed   check blood sugars at least 2-3 times/week and check blood sugar if you feel it is too high or too low. Take blood sugar log/meter to all provider visits Continue to check blood pressure at least 2-3 times/month and notify provider if blood pressure is consistently outside of recommended parameters continue to remain active/exercise per provider recommendations continue to attend provider visits as recommended. continue to eat healthy meals: fruits, vegetables, lean meats, healthy fats, low salt. Manage portion size remain active/exercise per provider recommendations.   Our next appointment is by telephone on 09/17/21 at 3:00 pm  Please call the care guide team at [phone] if you need to cancel or reschedule your appointment.   If you are experiencing a Mental Health or [place] or need someone to talk to, please call the Suicide and Crisis Lifeline: 988 call 1-800-273-TALK (toll free, 24 hour hotline)   Patient verbalizes understanding of instructions and care plan provided today and agrees to view in [place]. Active MyChart status confirmed with patient.    Thea Silversmith, RN, MSN, BSN, CCM Care Management Coordinator [facility] [phone]

## 2021-07-23 NOTE — Chronic Care Management (AMB) (Signed)
Chronic Care Management   CCM RN Visit Note  07/23/2021 Name: Bonnie Torres MRN: [id number] DOB: 02-Feb-1956  Subjective: Bonnie Torres is a 66 y.o. year old female who is a primary care patient of Donella Stade, PA-C. The care management team was consulted for assistance with disease management and care coordination needs.    Engaged with patient by telephone for follow up visit in response to provider referral for case management and/or care coordination services.   Consent to Services:  The patient was given information about Chronic Care Management services, agreed to services, and gave verbal consent prior to initiation of services.  Please see initial visit note for detailed documentation.   Patient agreed to services and verbal consent obtained.   Assessment: Review of patient past medical history, allergies, medications, health status, including review of consultants reports, laboratory and other test data, was performed as part of comprehensive evaluation and provision of chronic care management services.   SDOH (Social Determinants of Health) assessments and interventions performed:    CCM Care Plan  No Known Allergies  Outpatient Encounter Medications as of 07/23/2021  Medication Sig Note   atorvastatin (LIPITOR) 80 MG tablet Take 1 tablet (80 mg total) by mouth daily.    cyclobenzaprine (FLEXERIL) 10 MG tablet TAKE 1 TABLET BY MOUTH 3  TIMES DAILY AS NEEDED FOR  MUSCLE SPASM(S)    diclofenac Sodium (VOLTAREN) 1 % GEL APPLY 4 GRAMS TOPICALLY 4  TIMES DAILY TO LOWER  EXTREMITY JOINT    metFORMIN (GLUCOPHAGE-XR) 750 MG 24 hr tablet Take 1 tablet (750 mg total) by mouth daily with breakfast.    metoprolol succinate (TOPROL-XL) 100 MG 24 hr tablet Take 1 tablet (100 mg total) by mouth daily. appt for refills    Olmesartan-amLODIPine-HCTZ 20-5-12.5 MG TABS TAKE 1 TABLET BY MOUTH  DAILY    tacrolimus (PROTOPIC) 0.1 % ointment SMARTSIG:1 Topical Daily PRN    tretinoin  (RETIN-A) 0.05 % cream Apply 1 application topically at bedtime.     zolpidem (AMBIEN) 10 MG tablet Take 1 tablet (10 mg total) by mouth at bedtime.    Blood Glucose Monitoring Suppl (Gregory FLEX SYSTEM) w/Device KIT Check fasting blood sugar every morning. DM ICD10 E11.9    clindamycin (CLEOCIN T) 1 % external solution Apply 1 application topically 2 (two) times daily.    hydroquinone 4 % cream Apply topically 2 (two) times daily.    Lancets (ONETOUCH DELICA PLUS [id number]) MISC USE TO CHECK BLOOD SUGAR  TWICE DAILY    ONETOUCH VERIO test strip CHECK BLOOD GLUCOSE TWICE  DAILY AS DIRECTED AS NEEDED    potassium chloride SA (KLOR-CON) 20 MEQ tablet Take 1 tablet (20 mEq total) by mouth once for 1 dose. Patient needs appointment. 03/26/2021: Reports she is taking one 20 meq tablet once a day.   No facility-administered encounter medications on file as of 07/23/2021.    Patient Active Problem List   Diagnosis Date Noted   DDD (degenerative disc disease), lumbar 11/12/2020   Chronic kidney disease (CKD), stage III (moderate) ([place]) 03/12/2020   Bilateral plantar fasciitis 01/29/2020   neoplasm left kidney 01/29/2020   Hx of partial nephrectomy 01/29/2020   Neoplasm of left kidney 11/30/2019   Cubital tunnel syndrome, left 11/20/2019   Personal history of gout 01/25/2019   Acute gout of right foot 01/25/2019   Elevated uric acid in blood 01/25/2019   Mid back pain on left side 07/29/2018   Irregular heart rate 04/06/2018  Hypotension 04/06/2018   Elevated serum creatinine 12/07/2017   Type 2 diabetes mellitus without complication, without long-term current use of insulin ([place]) 12/03/2017   Class 1 obesity due to excess calories without serious comorbidity with body mass index (BMI) of 33.0 to 33.9 in adult 12/03/2017   Trigger middle finger of left hand 12/01/2017   Anterior cervical adenopathy 05/19/2017   Hypokalemia 04/02/2017   Obesity (BMI 30.0-34.9) 09/16/2016   Hot flashes  due to surgical menopause 08/19/2016   Lymphangioma 06/18/2016   Uterine mass 04/27/2016   Mass of kidney 04/05/2016   Elevated testosterone level in female 03/25/2016   Breast calcification, right 11/13/2015   Primary gout 10/16/2015   Bilateral low back pain without sciatica 04/17/2015   Hyperlipidemia 09/28/2014   Insomnia 08/31/2014   Essential hypertension, benign 08/31/2014    Conditions to be addressed/monitored:HTN, HLD, DMII, and CKD Stage 3  Care Plan : RN Care Management Plan of Care  Updates made by Bonnie Rued, RN since 07/23/2021 12:00 AM     Problem: Chronic Disease Managment Education and/or Care Coordination needs   Priority: High     Long-Range Goal: Development of Plan of Care for Chronic Disease Management and/or Care Coordination needs.   Start Date: 06/11/2021  Expected End Date: 09/09/2021  Priority: High  Note:   Current Barriers: Bonnie Torres reports blood sugar readings 94, 88, 97. Annual Wellness visit completed on 07/21/21. She denies any concern or issues at this time.  Knowledge Deficits related to plan of care for management of HTN, HLD, DMII, and CKD Stage 3  Chronic Disease Management support and education needs related to HTN, HLD, DMII, and CKD Stage 3  RNCM Clinical Goal(s):  Patient will verbalize understanding of plan for management of HTN, HLD, DMII, and CKD Stage 3 as evidenced by self report and/or chart notiation demonstrate ongoing adherence to prescribed treatment plan for HTN, HLD, DMII, and CKD Stage 3 as evidenced by attending provider visits as scheduled, taking medications, following up with provider with questions or concerns  through collaboration with RN Care manager, provider, and care team.   Interventions: 1:1 collaboration with primary care provider regarding development and update of comprehensive plan of care as evidenced by provider attestation and co-signature Inter-disciplinary care team collaboration (see  longitudinal plan of care) Evaluation of current treatment plan related to  self management and patient's adherence to plan as established by provider   HTN/Chronic Kidney Disease Interventions:  (Status:  Goal on track:  Yes.) Long Term Goal Evaluation of current treatment plan related to chronic kidney disease self management and patient's adherence to plan as established by provider      Reviewed upcoming appointment Encouraged to continue to eat healthy, monitor salt intake Last practice recorded BP readings:  BP Readings from Last 3 Encounters:  07/21/21 105/65  06/17/21 120/69  11/05/20 119/67  Most recent eGFR/CrCl: No results found for: EGFR  No components found for: CRCL  Diabetes Interventions:  (Status:  Goal on track:  Yes.) Long Term Goal Assessed patient's understanding of A1c goal: <7% Reviewed medications with patient and discussed importance of medication adherence Discussed blood sugar reading, encouraged patient to continue to check, record and notify provider for results outside normal range for patient Provided positive feedback regarding self management of health Lab Results  Component Value Date   HGBA1C 5.8 (A) 06/17/2021  Hyperlipidemia Interventions:  (Status:  Goal on track:  Yes.) Long Term Goal Per chart lipid profile 06/17/21: Cholesterol 156;  HDL 45; Triglyceride 84; LDL 94 Medication review performed; medication list updated in electronic medical record.  Reviewed importance of limiting foods high in cholesterol Encouraged to continue to attend provider visits as scheduled Provided positive feedback regarding self health management  Patient Goals/Self-Care Activities: Take medications as prescribed   check blood sugars at least 2-3 times/week and check blood sugar if you feel it is too high or too low. Take blood sugar log/meter to all provider visits Continue to check blood pressure at least 2-3 times/month and notify provider if blood pressure is  consistently outside of recommended parameters continue to remain active/exercise per provider recommendations continue to attend provider visits as recommended. continue to eat healthy meals: fruits, vegetables, lean meats, healthy fats, low salt. Manage portion size remain active/exercise per provider recommendations.    Plan: RNCM will follow up within the next 2-3 months. Telephone follow up appointment with care management team member scheduled for:  09/17/21 The patient has been provided with contact information for the care management team and has been advised to call with any health related questions or concerns.   Bonnie Silversmith, RN, MSN, BSN, CCM Care Management Coordinator [facility] [phone]

## 2021-07-29 DIAGNOSIS — N183 Chronic kidney disease, stage 3 unspecified: Secondary | ICD-10-CM | POA: Diagnosis not present

## 2021-07-29 DIAGNOSIS — E1122 Type 2 diabetes mellitus with diabetic chronic kidney disease: Secondary | ICD-10-CM | POA: Diagnosis not present

## 2021-07-29 DIAGNOSIS — E785 Hyperlipidemia, unspecified: Secondary | ICD-10-CM | POA: Diagnosis not present

## 2021-07-29 DIAGNOSIS — I129 Hypertensive chronic kidney disease with stage 1 through stage 4 chronic kidney disease, or unspecified chronic kidney disease: Secondary | ICD-10-CM | POA: Diagnosis not present

## 2021-07-29 DIAGNOSIS — Z7984 Long term (current) use of oral hypoglycemic drugs: Secondary | ICD-10-CM | POA: Diagnosis not present

## 2021-08-01 ENCOUNTER — Other Ambulatory Visit: Payer: Self-pay | Admitting: Neurology

## 2021-08-01 DIAGNOSIS — E876 Hypokalemia: Secondary | ICD-10-CM

## 2021-08-01 MED ORDER — POTASSIUM CHLORIDE CRYS ER 20 MEQ PO TBCR: 20.0000 meq | EXTENDED_RELEASE_TABLET | Freq: Every day | ORAL | 3 refills | Status: DC

## 2021-08-01 NOTE — Telephone Encounter (Signed)
Patient called and left vm wondering why Potassium was denied.   Tried to call back with no answer, medication was denied because it was sent in May for one year. I did let her know I sent this again to [facility] for a year since they told her they did not have prescription. She is to call with any questions.

## 2021-08-25 ENCOUNTER — Ambulatory Visit (INDEPENDENT_AMBULATORY_CARE_PROVIDER_SITE_OTHER): Payer: Medicare Other

## 2021-08-25 ENCOUNTER — Other Ambulatory Visit: Payer: Self-pay

## 2021-08-25 ENCOUNTER — Encounter: Payer: Self-pay | Admitting: Physician Assistant

## 2021-08-25 ENCOUNTER — Ambulatory Visit (INDEPENDENT_AMBULATORY_CARE_PROVIDER_SITE_OTHER): Payer: Medicare Other | Admitting: Physician Assistant

## 2021-08-25 VITALS — BP 107/73 | HR 80 | Ht <= 58 in | Wt 158.0 lb

## 2021-08-25 DIAGNOSIS — M25561 Pain in right knee: Secondary | ICD-10-CM

## 2021-08-25 DIAGNOSIS — Z8739 Personal history of other diseases of the musculoskeletal system and connective tissue: Secondary | ICD-10-CM | POA: Diagnosis not present

## 2021-08-25 DIAGNOSIS — N1832 Chronic kidney disease, stage 3b: Secondary | ICD-10-CM

## 2021-08-25 DIAGNOSIS — Z09 Encounter for follow-up examination after completed treatment for conditions other than malignant neoplasm: Secondary | ICD-10-CM

## 2021-08-25 DIAGNOSIS — M170 Bilateral primary osteoarthritis of knee: Secondary | ICD-10-CM

## 2021-08-25 DIAGNOSIS — D49512 Neoplasm of unspecified behavior of left kidney: Secondary | ICD-10-CM

## 2021-08-25 IMAGING — DX DG KNEE 1-2V*L*
2 series · 2 of 2 positions shown · non-contrast
Comparison: None.

CLINICAL DATA: Right knee pain for 1 week.

EXAM:
LEFT KNEE - 1-2 VIEW; RIGHT KNEE - COMPLETE 4+ VIEW

[knee ap bilat standing (1 of 2)]
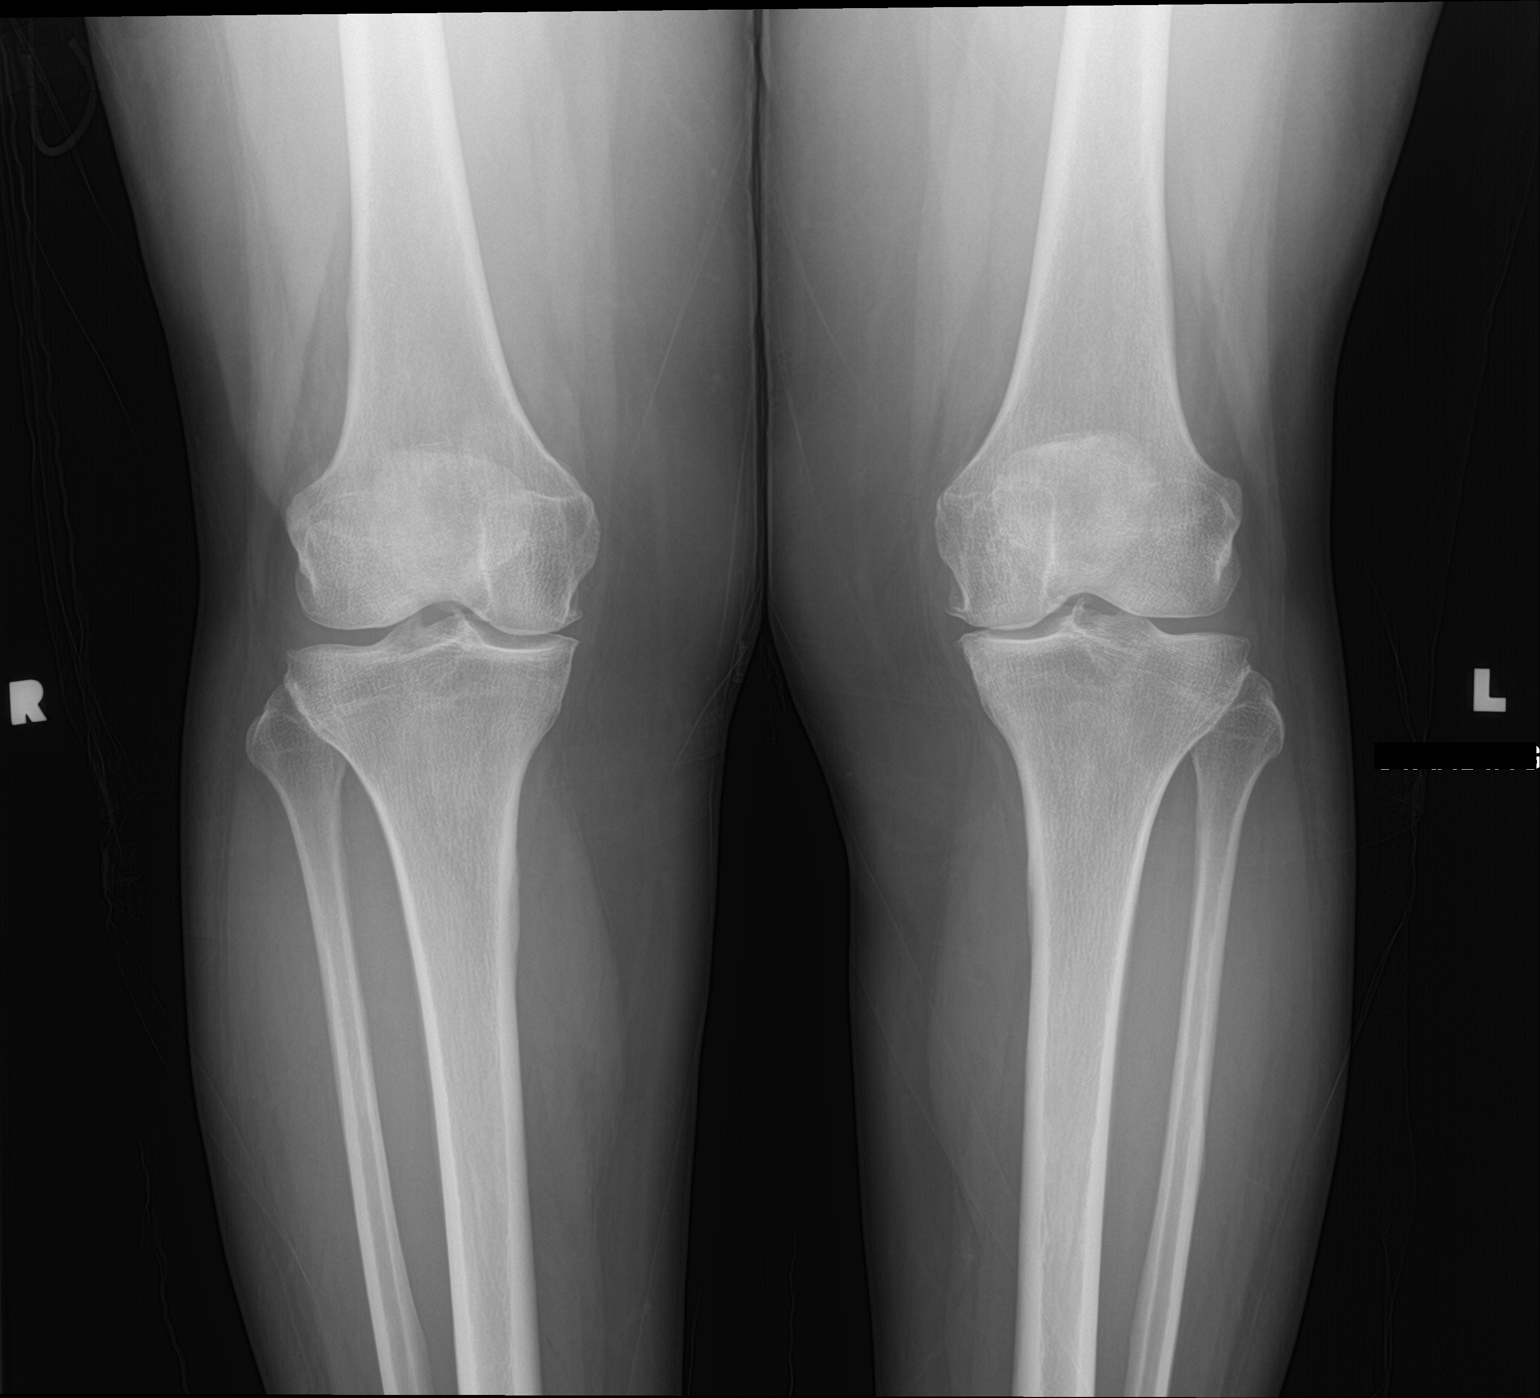

[knee ap bilat standing (2 of 2)]
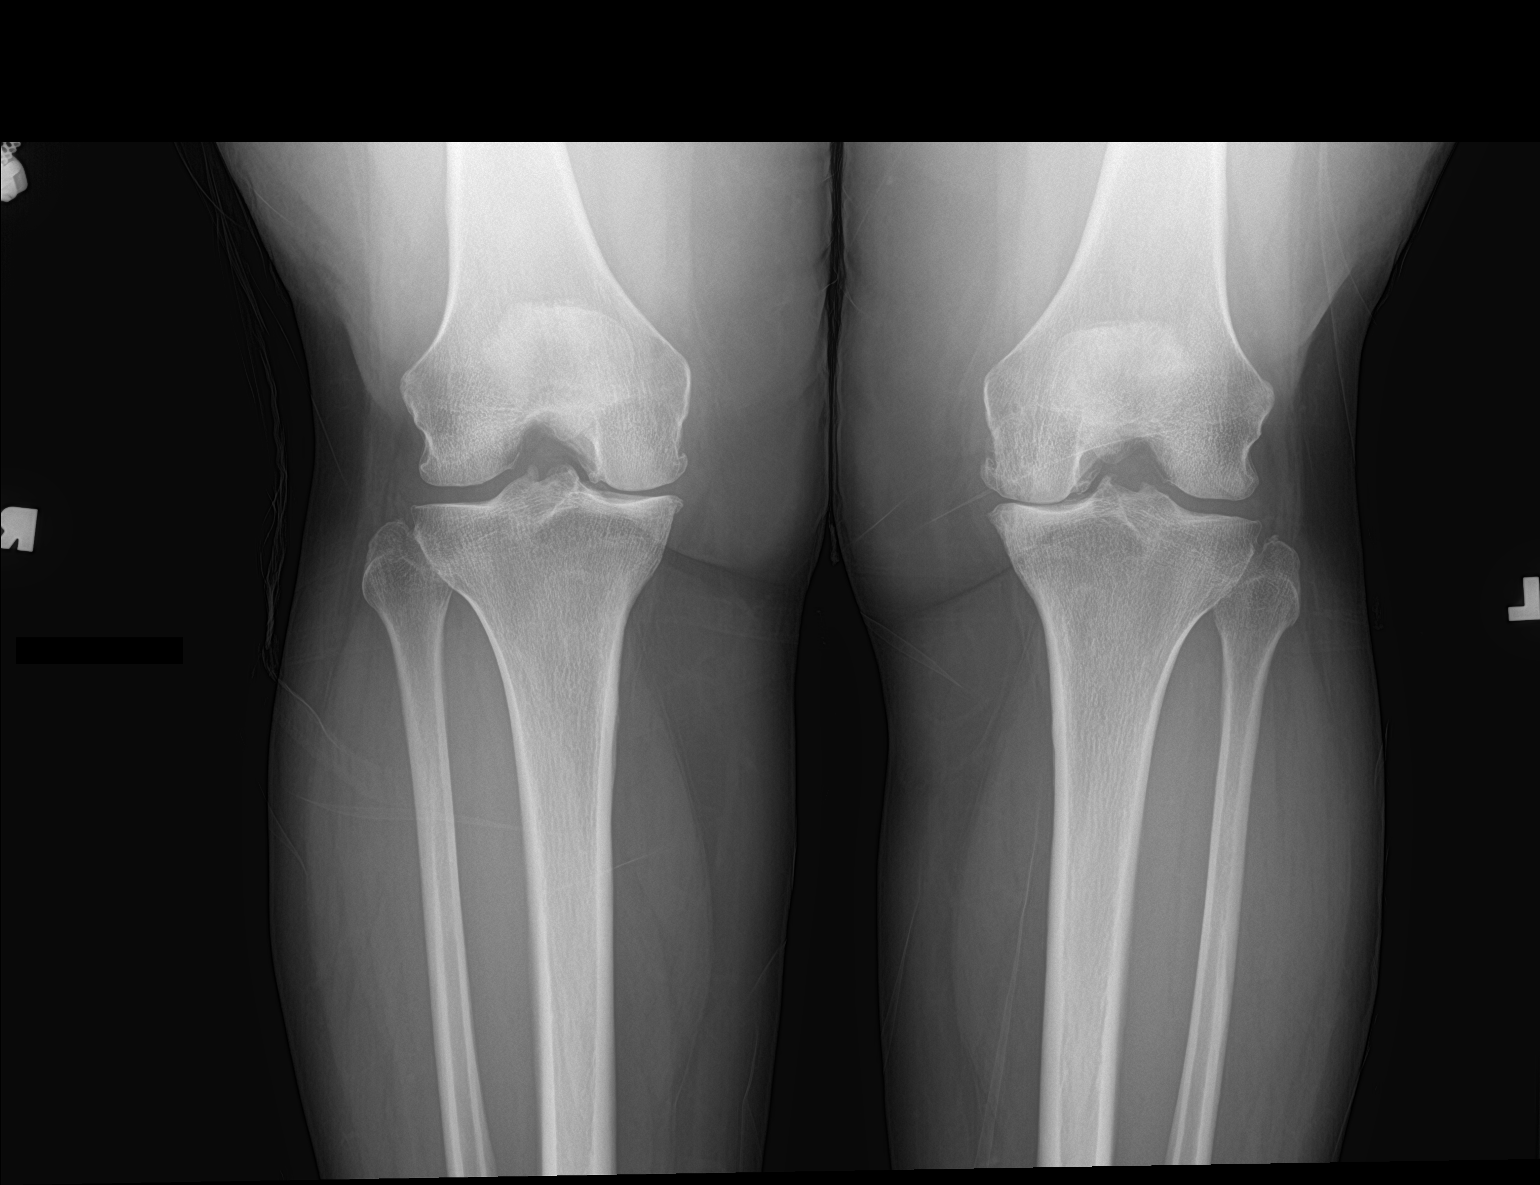

[2 of 2 positions shown; findings below may reference images not displayed]

FINDINGS: Right knee:

Moderate medial compartment joint space narrowing with mild
peripheral degenerative osteophytes. Mild-to-moderate patellofemoral
joint space narrowing and peripheral osteophytosis. No joint
effusion. No acute fracture or dislocation. Mild chronic
enthesopathic change at the quadriceps insertion on the patella.

Left knee:

On AP and PA views there is severe medial compartment joint space
narrowing and peripheral osteophytosis. The lateral compartment
joint space is maintained.
IMPRESSION: :
IMPRESSION: 1. Right knee moderate medial compartment and patellofemoral
compartment osteoarthritis.
2. Left knee severe medial compartment osteoarthritis.

## 2021-08-25 IMAGING — DX DG KNEE COMPLETE 4+V*R*
4 series · 4 of 4 positions shown · non-contrast
Comparison: None.

CLINICAL DATA: Right knee pain for 1 week.

EXAM:
LEFT KNEE - 1-2 VIEW; RIGHT KNEE - COMPLETE 4+ VIEW

[knee lat]
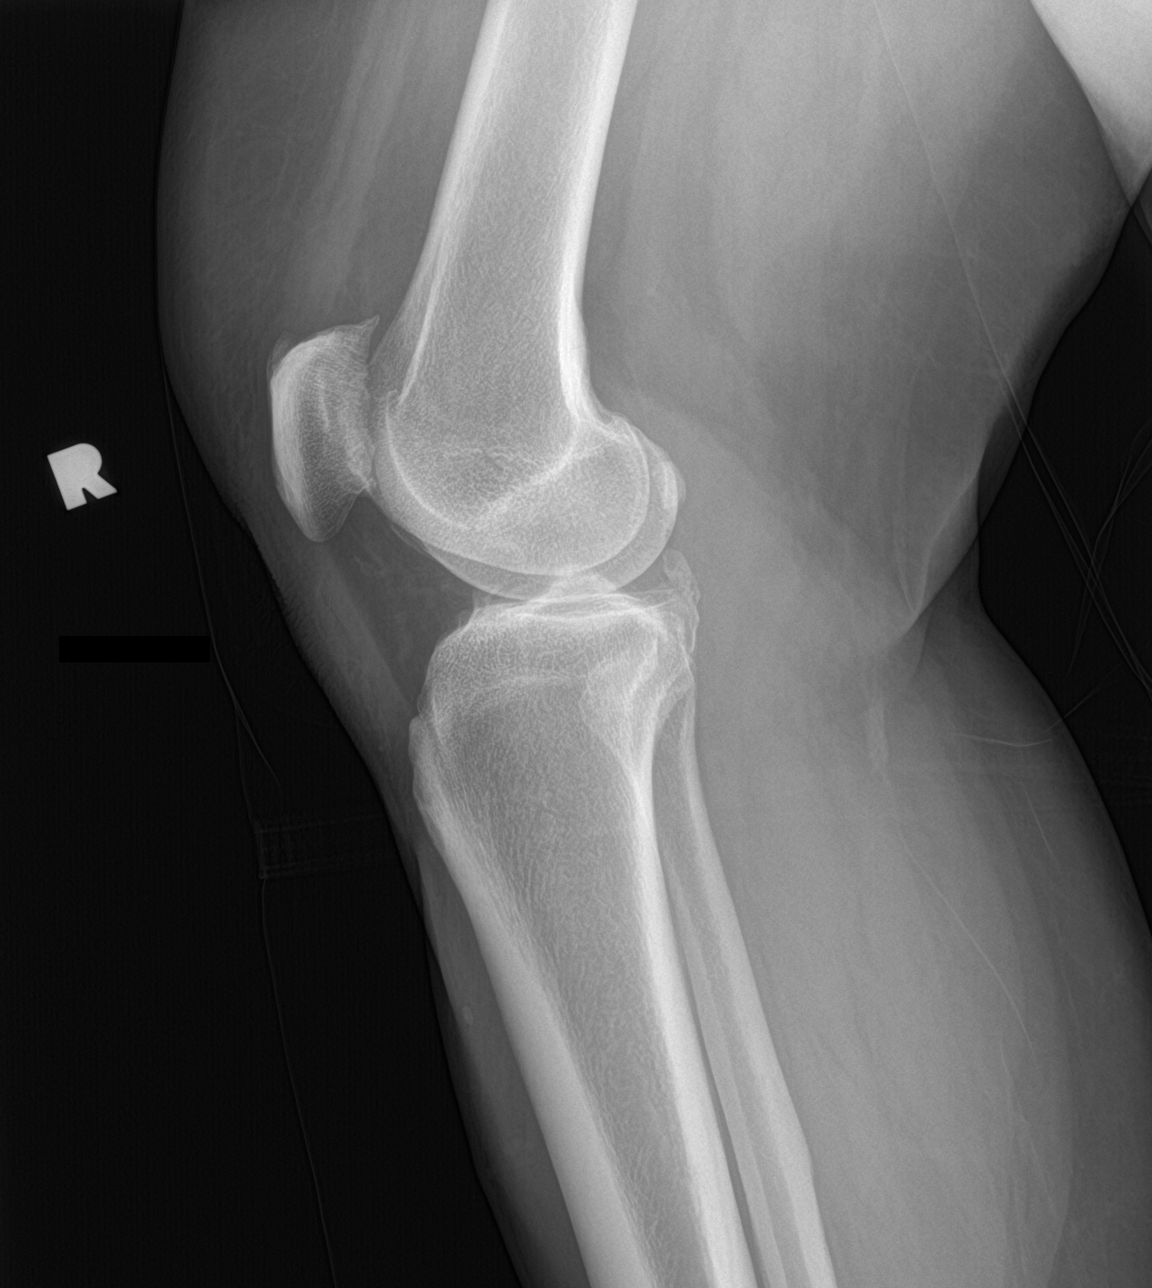

[knee ap bilat standing (1 of 2)]
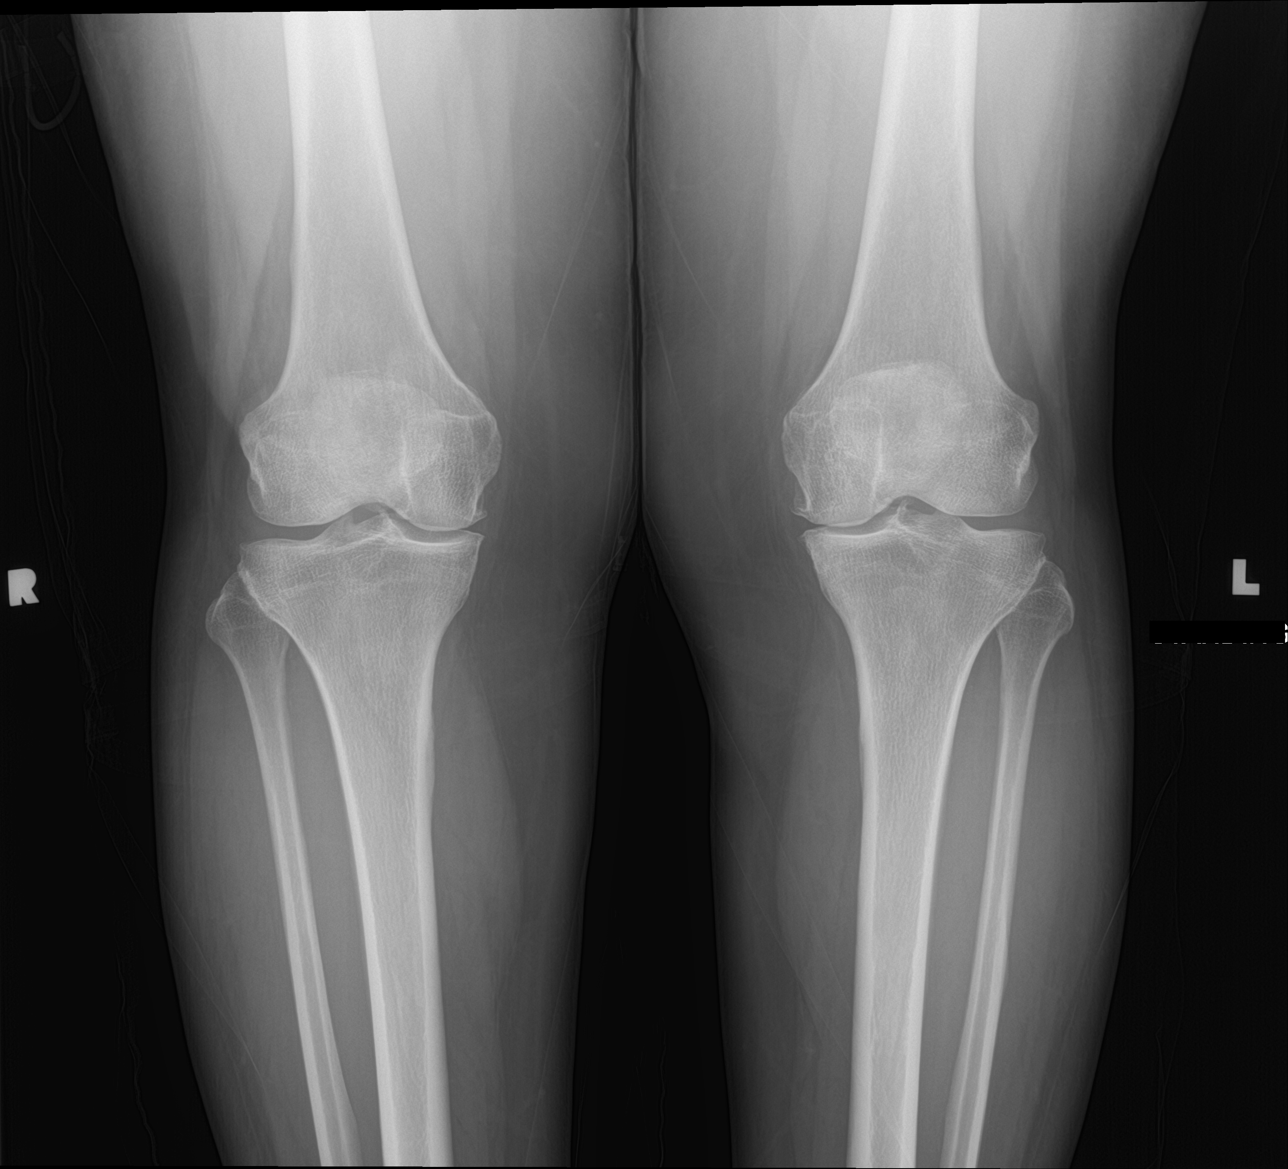

[knee sunrise standing]
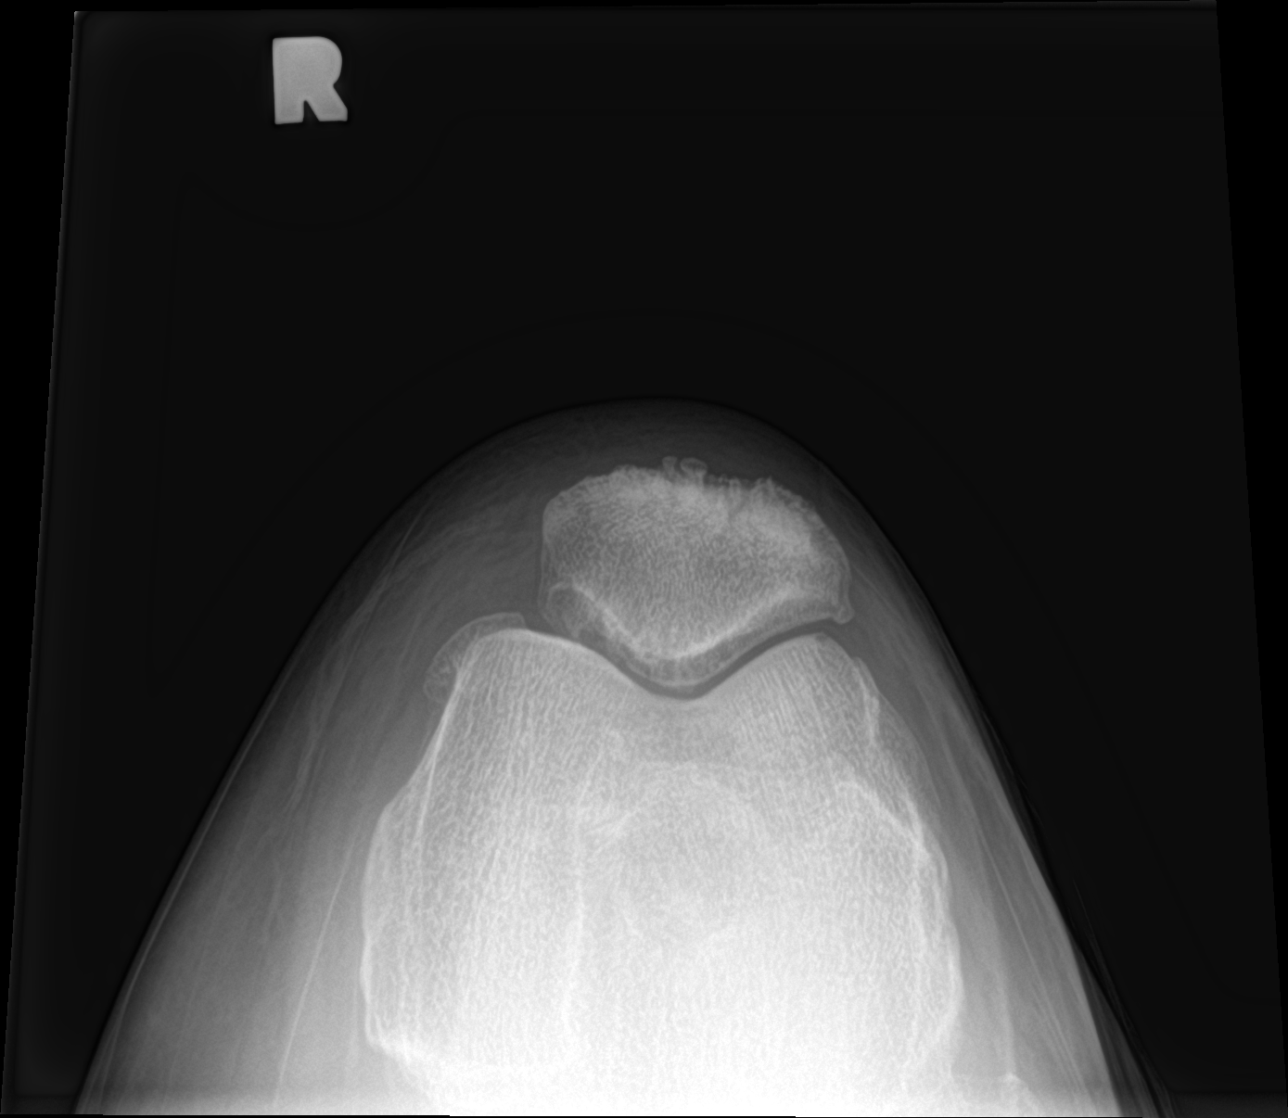

[knee ap bilat standing (2 of 2)]
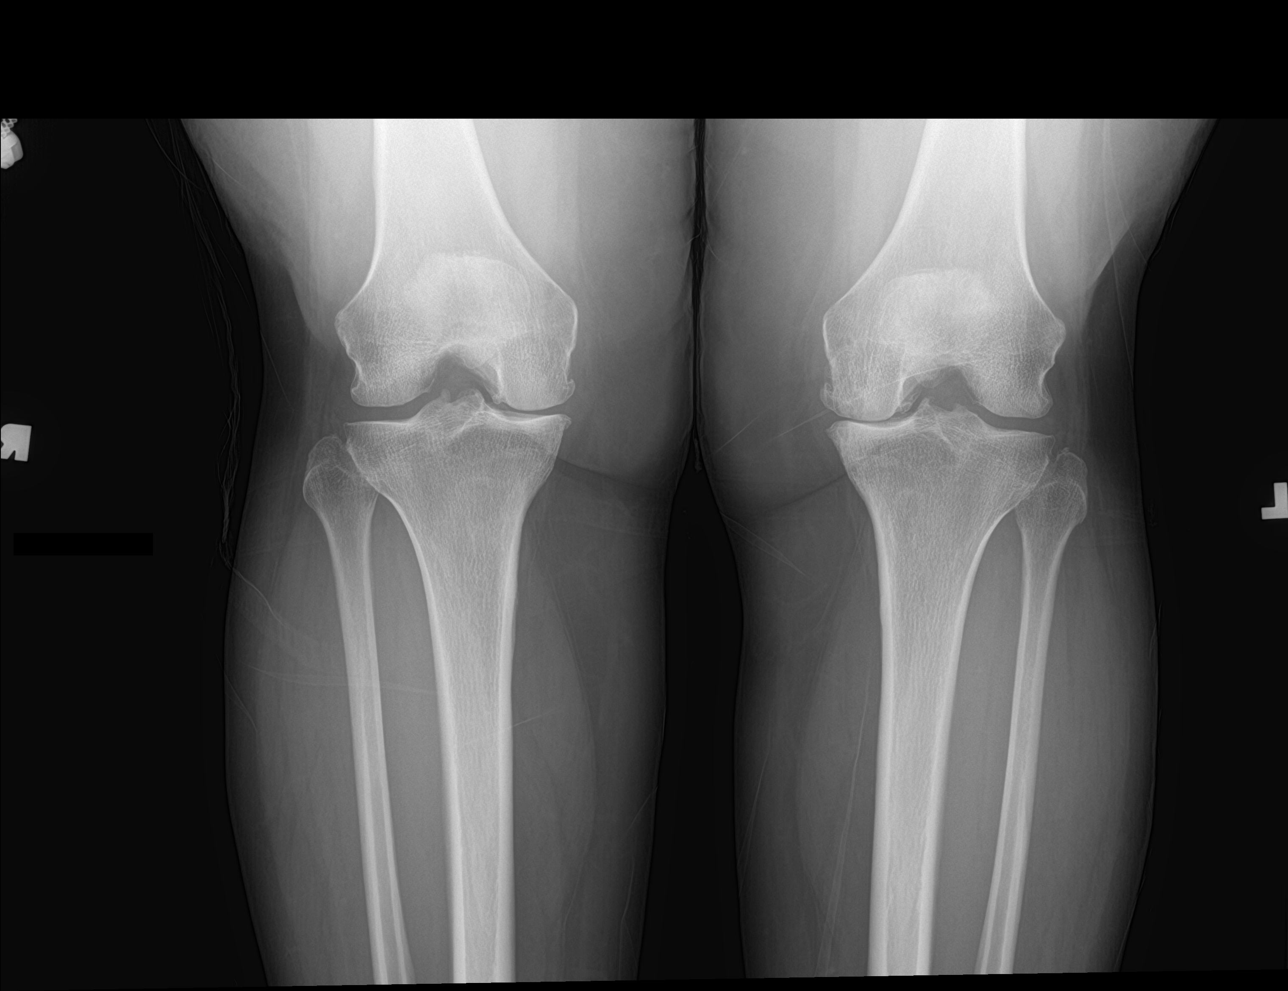

[4 of 4 positions shown; findings below may reference images not displayed]

FINDINGS: Right knee:

Moderate medial compartment joint space narrowing with mild
peripheral degenerative osteophytes. Mild-to-moderate patellofemoral
joint space narrowing and peripheral osteophytosis. No joint
effusion. No acute fracture or dislocation. Mild chronic
enthesopathic change at the quadriceps insertion on the patella.

Left knee:

On AP and PA views there is severe medial compartment joint space
narrowing and peripheral osteophytosis. The lateral compartment
joint space is maintained.
IMPRESSION: :
IMPRESSION: 1. Right knee moderate medial compartment and patellofemoral
compartment osteoarthritis.
2. Left knee severe medial compartment osteoarthritis.

## 2021-08-25 MED ORDER — DICLOFENAC SODIUM 1 % EX GEL: 4.0000 g | Freq: Four times a day (QID) | CUTANEOUS | 1 refills | Status: DC

## 2021-08-25 NOTE — Progress Notes (Signed)
Subjective:    Patient ID: Bonnie Torres, female    DOB: Mar 23, 1956, 66 y.o.   MRN: [id number]  Knee Pain    This is a 66 year old female with past medical history pertinent for gout with chief complaint of right sided knee pain since Friday afternoon. She reports wearing a new pair of shoes to all day Thursday and experienced right foot pain that ultimately migrated to her right knee. She describes the pain as "a hurting kind of pain" and is unsure if it is similar to past gout flares. Typically her gout affects her right great toe and has not had a flare in over 2 years. She used an old prescription of colchicine as directed on Saturday evening and noted moderate improvement in pain and range of motion She denies dietary changes aroudn the time of onset that could cause a gout flare (beer, red meats, shrimp, etc..). she denies any calf cramping when walking. She denies any falls or injuries to knee. She denies any catching.   She has had follow up with nephrologist and they are following another neoplasm on her left kidney.   Review of Systems  Constitutional: Negative.  Negative for chills and fever.  Respiratory: Negative.    Gastrointestinal: Negative.   Genitourinary: Negative.   Musculoskeletal:  Positive for arthralgias (Right kneee pain).  Skin: Negative.       Objective:   Physical Exam Constitutional:      General: She is not in acute distress.    Appearance: Normal appearance.  HENT:     Head: Normocephalic and atraumatic.  Cardiovascular:     Rate and Rhythm: Normal rate and regular rhythm.     Pulses: Normal pulses.     Heart sounds: Normal heart sounds. No murmur heard. Pulmonary:     Effort: Pulmonary effort is normal.     Breath sounds: Normal breath sounds. No wheezing.  Musculoskeletal:        General: Tenderness (Point tenderness to palpation at insertion of quadriceps tendon) present. No swelling, deformity or signs of injury.     Right lower leg: No  edema.     Left lower leg: No edema.     Comments: RLE is neurovascularly intact  Skin:    Findings: No erythema.  Neurological:     Mental Status: She is alert and oriented to person, place, and time.  Psychiatric:        Mood and Affect: Mood normal.        Behavior: Behavior normal.       Assessment & Plan:   Marland KitchenMarland KitchenFaith was seen today for knee pain.  Diagnoses and all orders for this visit:  Acute pain of right knee -     DG Knee 1-2 Views Left; Future -     DG Knee Complete 4 Views Right; Future -     diclofenac Sodium (VOLTAREN) 1 % GEL; Apply 4 g topically 4 (four) times daily. To affected joint. -     BASIC METABOLIC PANEL WITH GFR -     Uric acid  Personal history of gout -     diclofenac Sodium (VOLTAREN) 1 % GEL; Apply 4 g topically 4 (four) times daily. To affected joint. -     Uric acid  Stage 3b chronic kidney disease (HCC) -     BASIC METABOLIC PANEL WITH GFR -     Uric acid  Neoplasm of left kidney -     BASIC METABOLIC PANEL  WITH GFR   Unclear etiology. Pt does have a hx of gout but knee does not feel like gout. It is not hot, warm, swollen and seems to be improving.  Will get xray and check uric acid in labs. Has CKD 3 will recheck BMP.  Apply Voltaren gel to knees up to 4 times per day as needed for pain. Follow up with any new or worsening symptoms Will call after labs and imaging to determine the next steps.

## 2021-08-25 NOTE — Patient Instructions (Signed)
Use voltaren gel on knees as needed up to 4 times a day.

## 2021-08-26 ENCOUNTER — Encounter: Payer: Self-pay | Admitting: Physician Assistant

## 2021-08-26 DIAGNOSIS — M25561 Pain in right knee: Secondary | ICD-10-CM | POA: Insufficient documentation

## 2021-08-26 LAB — BASIC METABOLIC PANEL WITH GFR
BUN/Creatinine Ratio: 18 (calc) (ref 6–22)
BUN: 21 mg/dL (ref 7–25)
CO2: 23 mmol/L (ref 20–32)
Calcium: 10.2 mg/dL (ref 8.6–10.4)
Chloride: 105 mmol/L (ref 98–110)
Creat: 1.16 mg/dL — ABNORMAL HIGH (ref 0.50–1.05)
Glucose, Bld: 80 mg/dL (ref 65–99)
Potassium: 4.6 mmol/L (ref 3.5–5.3)
Sodium: 138 mmol/L (ref 135–146)
eGFR: 53 mL/min/{1.73_m2} — ABNORMAL LOW (ref >=60)

## 2021-08-26 LAB — URIC ACID: Uric Acid, Serum: 8.5 mg/dL — ABNORMAL HIGH (ref 2.5–7.0)

## 2021-08-26 NOTE — Progress Notes (Signed)
Kidney function dropped quite a bit from 2 months ago and uric acid levels are pretty elevated. We could treat your knee pain as a gout flare and then we could start preventative with allopurinol. Are you ok with this? Goal uric acid levels are 6.

## 2021-08-27 DIAGNOSIS — M170 Bilateral primary osteoarthritis of knee: Secondary | ICD-10-CM | POA: Insufficient documentation

## 2021-08-27 MED ORDER — FEBUXOSTAT 40 MG PO TABS: 40.0000 mg | ORAL_TABLET | Freq: Every day | ORAL | 5 refills | Status: DC

## 2021-08-27 NOTE — Addendum Note (Signed)
Addended by: Donella Stade on: 08/27/2021 04:27 PM   Modules accepted: Orders

## 2021-08-27 NOTE — Progress Notes (Signed)
Bilateral knee arthritis as well in both knee. Left is actually worse than right. Some of your pain could be coming from arthritis as well.

## 2021-09-03 ENCOUNTER — Other Ambulatory Visit: Payer: Self-pay

## 2021-09-03 ENCOUNTER — Ambulatory Visit (INDEPENDENT_AMBULATORY_CARE_PROVIDER_SITE_OTHER): Payer: Medicare Other

## 2021-09-03 DIAGNOSIS — Z1231 Encounter for screening mammogram for malignant neoplasm of breast: Secondary | ICD-10-CM | POA: Diagnosis not present

## 2021-09-03 DIAGNOSIS — Z0000 Encounter for general adult medical examination without abnormal findings: Secondary | ICD-10-CM

## 2021-09-03 IMAGING — MG MM DIGITAL SCREENING BILAT W/ TOMO AND CAD
8 series · 8 of 24 positions shown · non-contrast
Comparison: Previous exam(s).

CLINICAL DATA: Screening.

EXAM:
DIGITAL SCREENING BILATERAL MAMMOGRAM WITH TOMOSYNTHESIS AND CAD
TECHNIQUE: Bilateral screening digital craniocaudal and mediolateral oblique
mammograms were obtained. Bilateral screening digital breast
tomosynthesis was performed. The images were evaluated with
computer-aided detection.

[L CC synth-2D]
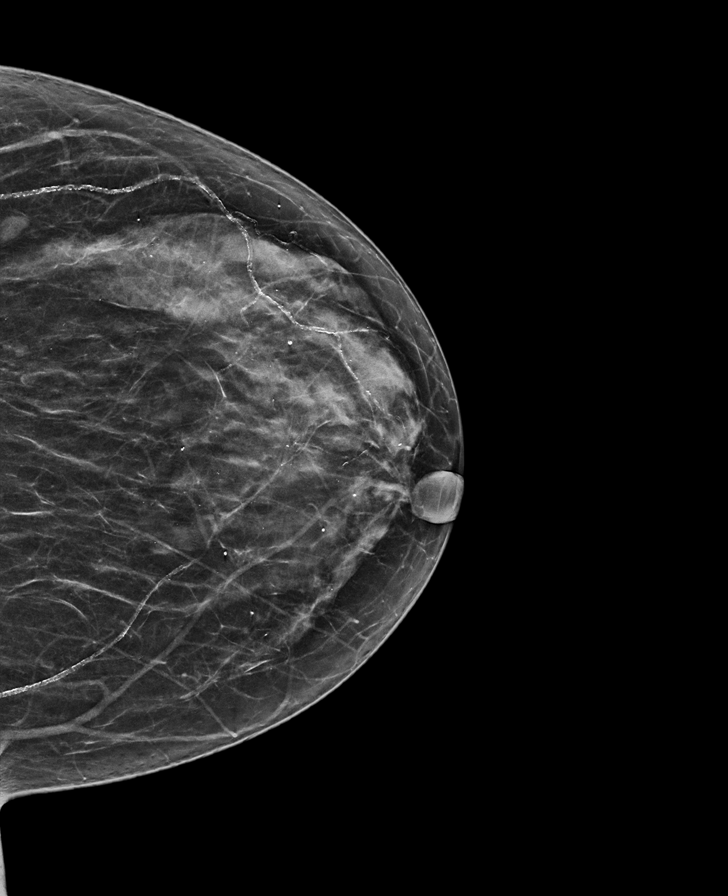

[R CC synth-2D]
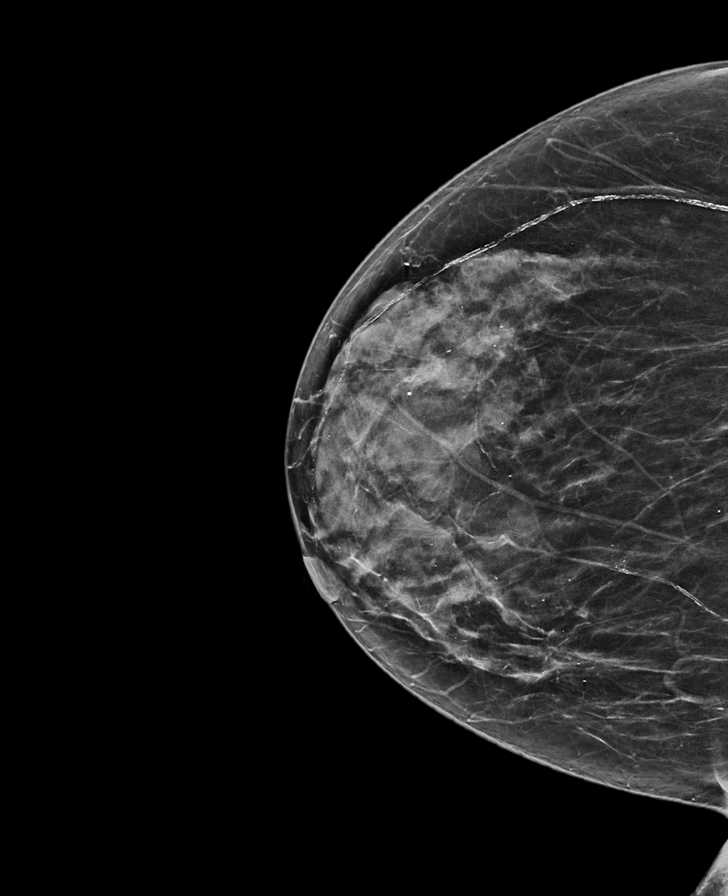

[L MLO synth-2D]
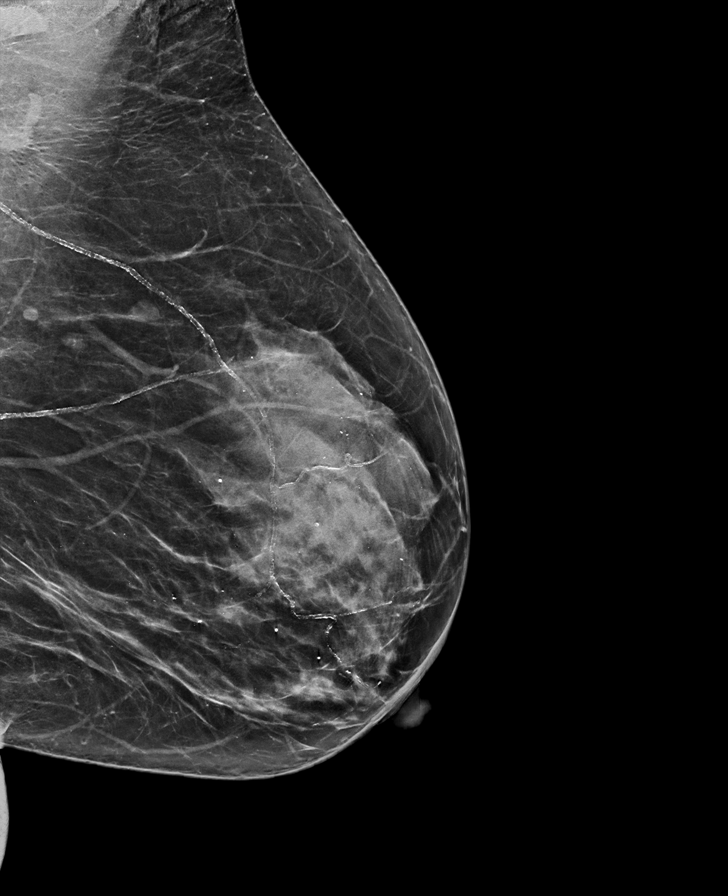

[R MLO synth-2D]
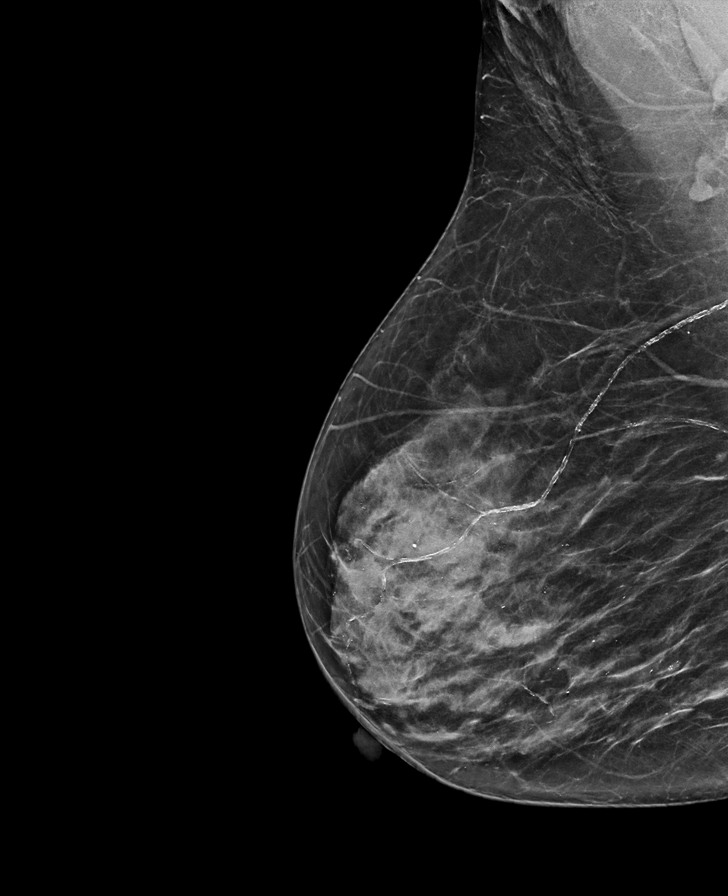

[R MLO tomo · tomo slice 34/67.0]
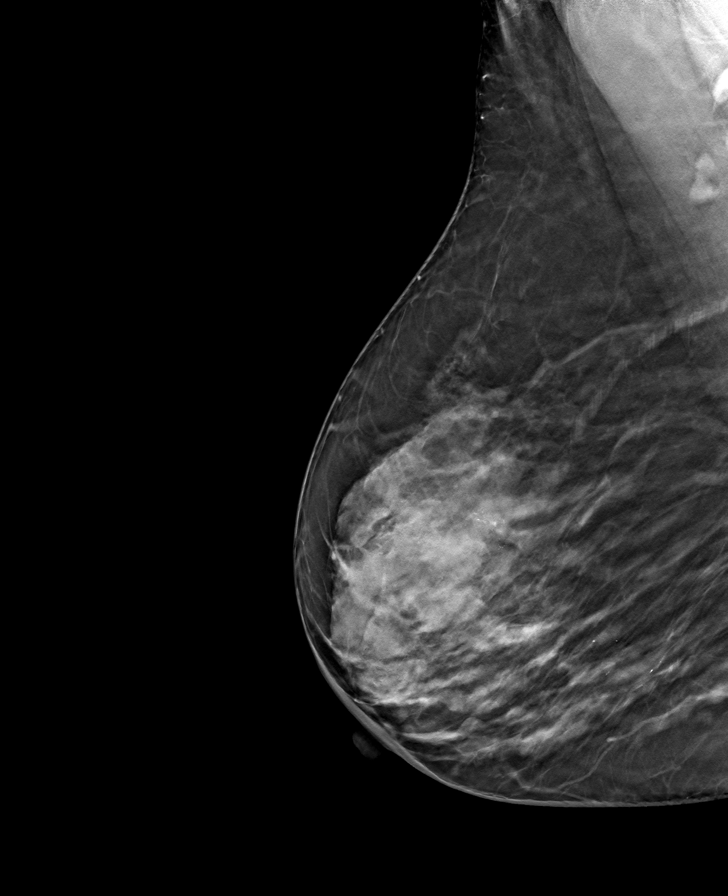

[R CC tomo · tomo slice 32/63.0]
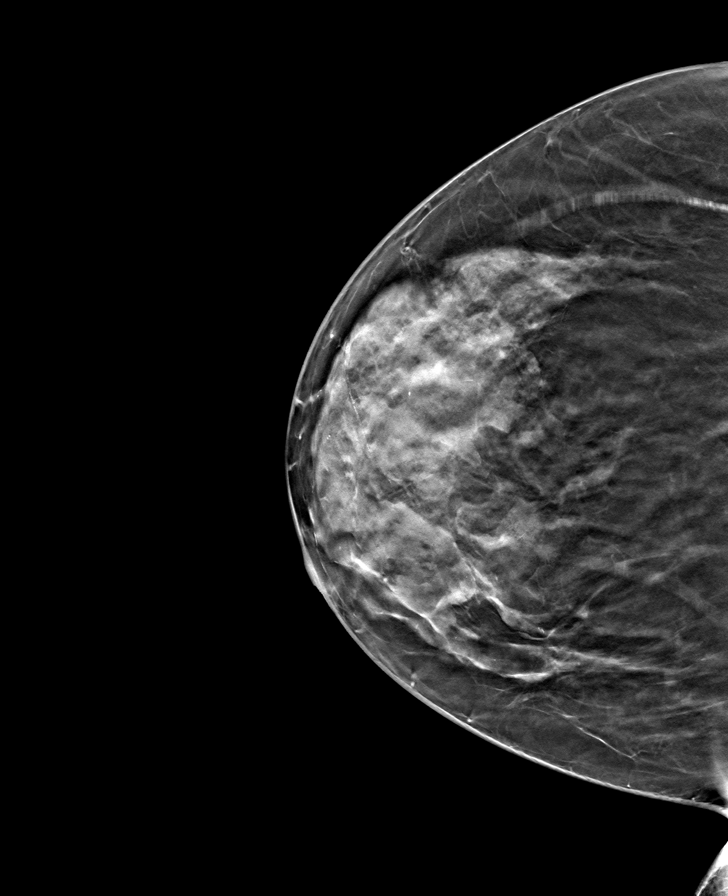

[L MLO tomo · tomo slice 35/70.0]
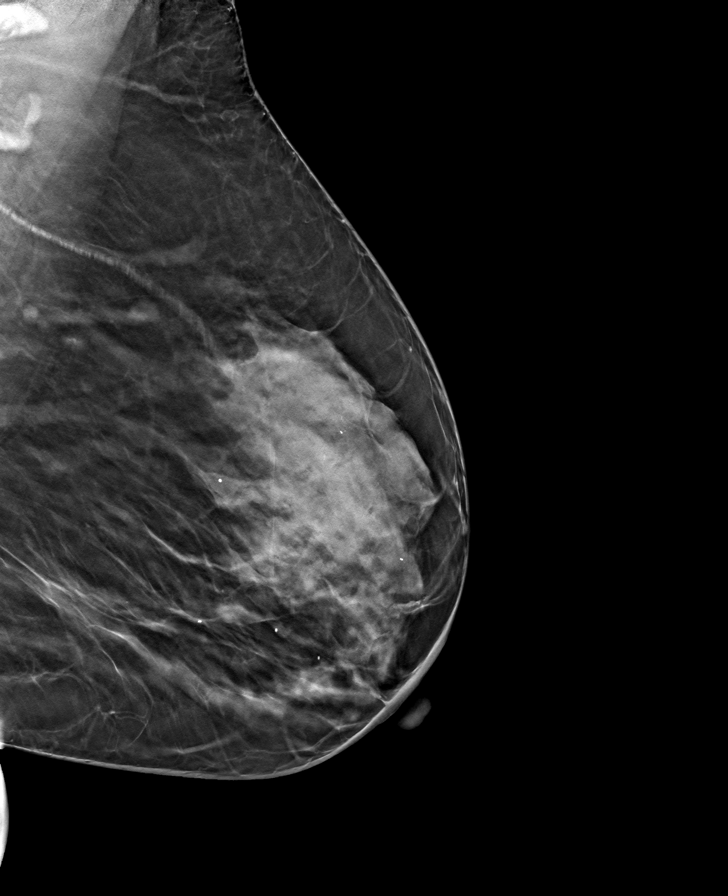

[L CC tomo · tomo slice 31/61.0]
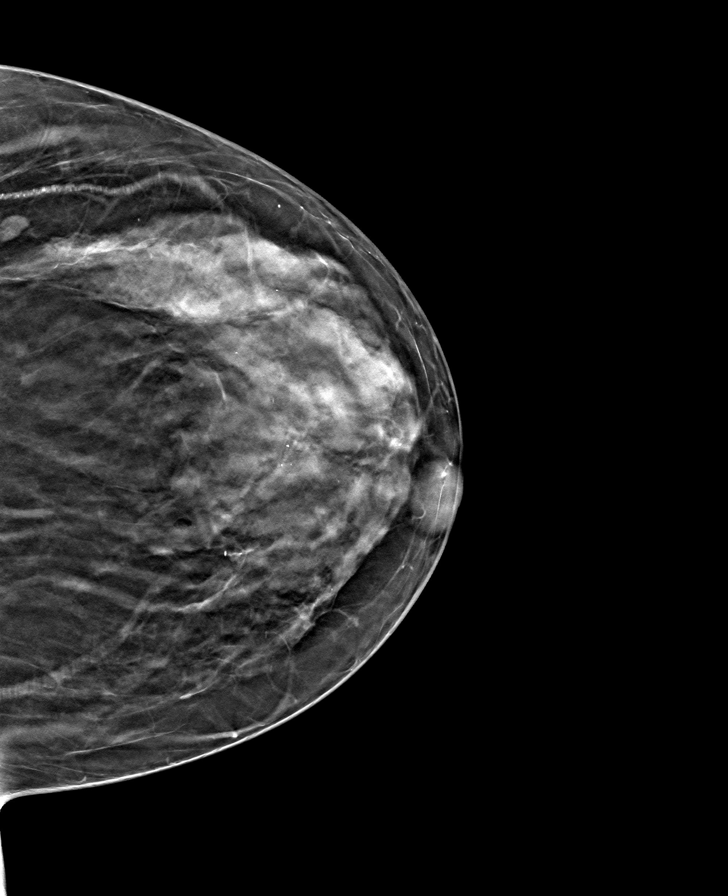

[8 of 24 positions shown; findings below may reference images not displayed]

ACR Breast Density Category c: The breast tissue is heterogeneously
dense, which may obscure small masses.
FINDINGS: There are no findings suspicious for malignancy.
IMPRESSION: No mammographic evidence of malignancy. A result letter of this
screening mammogram will be mailed directly to the patient.

RECOMMENDATION:
Screening mammogram in one year. (Code:[V2])

BI-RADS CATEGORY  1: Negative.

## 2021-09-04 ENCOUNTER — Telehealth: Payer: Self-pay | Admitting: *Deleted

## 2021-09-04 NOTE — Chronic Care Management (AMB) (Signed)
?  Care Management  ? ?Note ? ?09/04/2021 ?Name: [name] MRN: [id number] DOB: 08/18/1955 ? ?Bonnie Torres is a 66 y.o. year old female who is a primary care patient of Lavada Mesi and is actively engaged with the care management team. I reached out to Renee Rival by phone today to assist with re-scheduling a follow up visit with the RN Case Manager ? ?Follow up plan: ?Unsuccessful telephone outreach attempt made. A HIPAA compliant phone message was left for the patient providing contact information and requesting a return call.  ? ?Sandhya Denherder, CCMA ?Care Guide, Embedded Care Coordination ?[place]  Care Management  ?Direct Dial: [phone] ? ? ?

## 2021-09-04 NOTE — Progress Notes (Signed)
Normal mammogram. Follow up in 1 year.

## 2021-09-05 NOTE — Chronic Care Management (AMB) (Signed)
?  Care Management  ? ?Note ? ?09/05/2021 ?Name: Bonnie Torres MRN: [id number] DOB: 1955/08/04 ? ?Bonnie Torres is a 66 y.o. year old female who is a primary care patient of Lavada Mesi and is actively engaged with the care management team. I reached out to Renee Rival by phone today to assist with re-scheduling a follow up visit with the RN Case Manager ? ?Follow up plan: ?Telephone appointment with care management team member scheduled for: 09/24/2021 ? ?Eathon Valade, CCMA ?Care Guide, Embedded Care Coordination ?[place]  Care Management  ?Direct Dial: [phone] ? ? ?

## 2021-09-17 ENCOUNTER — Telehealth: Payer: Medicare Other

## 2021-09-24 ENCOUNTER — Ambulatory Visit (INDEPENDENT_AMBULATORY_CARE_PROVIDER_SITE_OTHER): Payer: Medicare Other

## 2021-09-24 DIAGNOSIS — I10 Essential (primary) hypertension: Secondary | ICD-10-CM

## 2021-09-24 DIAGNOSIS — E119 Type 2 diabetes mellitus without complications: Secondary | ICD-10-CM

## 2021-09-24 NOTE — Chronic Care Management (AMB) (Signed)
?Chronic Care Management  ? ?CCM RN Visit Note ? ?09/24/2021 ?Name: Bonnie Torres MRN: [id number] DOB: [birth year] ? ?Subjective: ?Bonnie Torres is a 66 y.o. year old female who is a primary care patient of Alden Hipp, Royetta Car, PA-C. The care management team was consulted for assistance with disease management and care coordination needs.   ? ?Engaged with patient by telephone for follow up visit in response to provider referral for case management and/or care coordination services.  ? ?Consent to Services:  ?The patient was given information about Chronic Care Management services, agreed to services, and gave verbal consent prior to initiation of services.  Please see initial visit note for detailed documentation.  ? ?Patient agreed to services and verbal consent obtained.  ? ?Assessment: Review of patient past medical history, allergies, medications, health status, including review of consultants reports, laboratory and other test data, was performed as part of comprehensive evaluation and provision of chronic care management services.  ? ?SDOH (Social Determinants of Health) assessments and interventions performed:   ? ?CCM Care Plan ? ?No Known Allergies ? ?Outpatient Encounter Medications as of 09/24/2021  ?Medication Sig  ? atorvastatin (LIPITOR) 80 MG tablet Take 1 tablet (80 mg total) by mouth daily.  ? cyclobenzaprine (FLEXERIL) 10 MG tablet TAKE 1 TABLET BY MOUTH 3  TIMES DAILY AS NEEDED FOR  MUSCLE SPASM(S)  ? diclofenac Sodium (VOLTAREN) 1 % GEL APPLY 4 GRAMS TOPICALLY 4  TIMES DAILY TO LOWER  EXTREMITY JOINT  ? diclofenac Sodium (VOLTAREN) 1 % GEL Apply 4 g topically 4 (four) times daily. To affected joint.  ? hydroquinone 4 % cream Apply topically 2 (two) times daily.  ? metFORMIN (GLUCOPHAGE-XR) 750 MG 24 hr tablet Take 1 tablet (750 mg total) by mouth daily with breakfast.  ? metoprolol succinate (TOPROL-XL) 100 MG 24 hr tablet Take 1 tablet (100 mg total) by mouth daily. appt for refills  ?  Olmesartan-amLODIPine-HCTZ 20-5-12.5 MG TABS TAKE 1 TABLET BY MOUTH  DAILY  ? potassium chloride SA (KLOR-CON M) 20 MEQ tablet Take 1 tablet (20 mEq total) by mouth daily.  ? tacrolimus (PROTOPIC) 0.1 % ointment SMARTSIG:1 Topical Daily PRN  ? tretinoin (RETIN-A) 0.05 % cream Apply 1 application topically at bedtime.   ? zolpidem (AMBIEN) 10 MG tablet Take 1 tablet (10 mg total) by mouth at bedtime.  ? Blood Glucose Monitoring Suppl ([place] FLEX SYSTEM) w/Device KIT Check fasting blood sugar every morning. DM ICD10 E11.9  ? clindamycin (CLEOCIN T) 1 % external solution Apply 1 application topically 2 (two) times daily.  ? febuxostat (ULORIC) 40 MG tablet Take 1 tablet (40 mg total) by mouth daily. (Patient not taking: Reported on 09/24/2021)  ? Lancets (ONETOUCH DELICA PLUS [id number]) MISC USE TO CHECK BLOOD SUGAR  TWICE DAILY  ? ONETOUCH VERIO test strip CHECK BLOOD GLUCOSE TWICE  DAILY AS DIRECTED AS NEEDED  ? ?No facility-administered encounter medications on file as of 09/24/2021.  ? ? ?Patient Active Problem List  ? Diagnosis Date Noted  ? Bilateral primary osteoarthritis of knee 08/27/2021  ? Acute pain of right knee 08/26/2021  ? DDD (degenerative disc disease), lumbar 11/12/2020  ? Stage 3b chronic kidney disease ([place]) 03/12/2020  ? Bilateral plantar fasciitis 01/29/2020  ? neoplasm left kidney 01/29/2020  ? Hx of partial nephrectomy 01/29/2020  ? Neoplasm of left kidney 11/30/2019  ? Cubital tunnel syndrome, left 11/20/2019  ? Personal history of gout 01/25/2019  ? Acute gout of right foot 01/25/2019  ?  Elevated uric acid in blood 01/25/2019  ? Mid back pain on left side 07/29/2018  ? Irregular heart rate 04/06/2018  ? Hypotension 04/06/2018  ? Elevated serum creatinine 12/07/2017  ? Type 2 diabetes mellitus without complication, without long-term current use of insulin ([place]) 12/03/2017  ? Class 1 obesity due to excess calories without serious comorbidity with body mass index (BMI) of 33.0 to 33.9 in  adult 12/03/2017  ? Trigger middle finger of left hand 12/01/2017  ? Anterior cervical adenopathy 05/19/2017  ? Hypokalemia 04/02/2017  ? Obesity (BMI 30.0-34.9) 09/16/2016  ? Hot flashes due to surgical menopause 08/19/2016  ? Lymphangioma 06/18/2016  ? Uterine mass 04/27/2016  ? Mass of kidney 04/05/2016  ? Elevated testosterone level in female 03/25/2016  ? Breast calcification, right 11/13/2015  ? Primary gout 10/16/2015  ? Bilateral low back pain without sciatica 04/17/2015  ? Hyperlipidemia 09/28/2014  ? Insomnia 08/31/2014  ? Essential hypertension, benign 08/31/2014  ? ? ?Conditions to be addressed/monitored:HTN, HLD, DMII, and CKD Stage 3 ? ?Care Plan : RN Care Management Plan of Care  ?Updates made by Luretha Rued, RN since 09/24/2021 12:00 AM  ?  ? ?Problem: Chronic Disease Managment Education and/or Care Coordination needs   ?Priority: High  ?  ? ?Long-Range Goal: Development of Plan of Care for Chronic Disease Management and/or Care Coordination needs.   ?Start Date: 06/11/2021  ?Expected End Date: 09/09/2021  ?Priority: High  ?Note:   ?Current Barriers: Ms. Soller reports she continues to remain active. She states she did not start Uloric prescribed for gout/R knee pain, because she did not like what she read regarding possible side effects. She states she is using Voltaren Gel. She reports that her knee pain has resolved. She reports she is checking her blood pressure 2-3 times/month. And states she is not checking her blood sugar as regular. She denies any concern or issues at this time.  ?Knowledge Deficits related to plan of care for management of HTN, HLD, DMII, and CKD Stage 3  ?Chronic Disease Management support and education needs related to HTN, HLD, DMII, and CKD Stage 3 ? ?RNCM Clinical Goal(s):  ?Patient will verbalize understanding of plan for management of HTN, HLD, DMII, and CKD Stage 3 as evidenced by self report and/or chart notiation ?demonstrate ongoing adherence to prescribed  treatment plan for HTN, HLD, DMII, and CKD Stage 3 as evidenced by attending provider visits as scheduled, taking medications, following up with provider with questions or concerns  through collaboration with RN Care manager, provider, and care team.  ? ?Interventions: ?1:1 collaboration with primary care provider regarding development and update of comprehensive plan of care as evidenced by provider attestation and co-signature ?Inter-disciplinary care team collaboration (see longitudinal plan of care) ?Evaluation of current treatment plan related to  self management and patient's adherence to plan as established by provider ? ? HTN/Chronic Kidney Disease Interventions:  (Status:  Goal on track:  Yes.) Long Term Goal ?Evaluation of current treatment plan related to chronic kidney disease self management and patient's adherence to plan as established by provider      ?Reviewed upcoming/scheduled appointment ?Encouraged to continue to eat healthy, monitor salt intake ?Discussed blood pressure readings and encouraged goal of BP <140/90 ?Home BP Reading:  ?BP on 09/23/21 117/72 (at dentist office) ?Last practice recorded BP readings:  ?BP Readings from Last 3 Encounters:  ?07/21/21 105/65  ?06/17/21 120/69  ?11/05/20 119/67  ?Most recent eGFR/CrCl: No results found for: EGFR  No  components found for: CRCL ? ?Diabetes Interventions:  (Status:  Goal on track:  Yes.) Long Term Goal ?Last checked was 1 1/2 weeks ago: 96 about 1 1/2 week  ?Assessed patient's understanding of A1c goal: <7% ?Reviewed medications with patient and discussed importance of medication adherence ?Discussed importance of keeping track of blood sugar readings ?Encouraged patient to continue to check at least 2-3 times/week and if feeling signs/symptoms of low or high blood sugar ?Discussed signs/symptoms of hypoglycemia and treatment ?Lab Results  ?Component Value Date  ? HGBA1C 5.8 (A) 06/17/2021  ?Hyperlipidemia Interventions:  (Status:  Goal on  track:  Yes.) Long Term Goal ?Per chart lipid profile 06/17/21: Cholesterol 156; HDL 45; Triglyceride 84; LDL 94 ?Medication reviewed and encouraged to take as prescribed  ?Reviewed importance of limiting foods high

## 2021-09-24 NOTE — Patient Instructions (Addendum)
Visit Information ? ?Thank you for taking time to visit with me today. Please don't hesitate to contact me if I can be of assistance to you before our next scheduled telephone appointment. ? ?Following are the goals we discussed today:  ?Patient Goals/Self-Care Activities: ?Take medications as prescribed   ?check blood sugars at least 2-3 times/week and check blood sugar if you feel it is too high or too low. Take blood sugar log/meter to all provider visits ?continue to check blood pressure at least 2-3 times/month and notify provider if blood pressure is consistently outside of recommended parameters ?continue to remain active/exercise per provider recommendations ?continue to attend provider visits as scheduled. ?continue to eat healthy meals: fruits, vegetables, lean meats, healthy fats, low salt. Manage portion size. Avoid foods with saturated fats and transfats ? ?Our next appointment is by telephone on 11/26/21 at 3:00 pm ? ?Please call the care guide team at [phone] if you need to cancel or reschedule your appointment.  ? ?If you are experiencing a Mental Health or [place] or need someone to talk to, please call the Suicide and Crisis Lifeline: 988 ?call 1-800-273-TALK (toll free, 24 hour hotline)  ? ?Patient verbalizes understanding of instructions and care plan provided today and agrees to view in [place]. Active MyChart status confirmed with patient.   ? ?Thea Silversmith, RN, MSN, BSN, CCM ?Care Management Coordinator ?[facility] ?[phone]  ? ?Hypoglycemia ?Hypoglycemia occurs when the level of sugar (glucose) in the blood is too low. Hypoglycemia can happen in people who have or do not have diabetes. It can develop quickly, and it can be a medical emergency. For most people, a blood glucose level below 70 mg/dL (3.9 mmol/L) is considered hypoglycemia. ?Glucose is a type of sugar that provides the body's main source of energy. Certain hormones (insulin and glucagon)  control the level of glucose in the blood. Insulin lowers blood glucose, and glucagon raises blood glucose. Hypoglycemia can result from having too much insulin in the bloodstream, or from not eating enough food that contains glucose. You may also have reactive hypoglycemia, which happens within 4 hours after eating a meal. ?What are the causes? ?Hypoglycemia occurs most often in people who have diabetes and may be caused by: ?Diabetes medicine. ?Not eating enough, or not eating often enough. ?Increased physical activity. ?Drinking alcohol on an empty stomach. ?If you do not have diabetes, hypoglycemia may be caused by: ?A tumor in the pancreas. ?Not eating enough, or not eating for long periods at a time (fasting). ?A severe infection or illness. ?Problems after having bariatric surgery. ?Organ failure, such as kidney or liver failure. ?Certain medicines. ?What increases the risk? ?Hypoglycemia is more likely to develop in people who: ?Have diabetes and take medicines to lower blood glucose. ?Abuse alcohol. ?Have a severe illness. ?What are the signs or symptoms? ?Symptoms vary depending on whether the condition is mild, moderate, or severe. ?Mild hypoglycemia ?Hunger. ?Sweating and feeling clammy. ?Dizziness or feeling light-headed. ?Sleepiness or restless sleep. ?Nausea. ?Increased heart rate. ?Headache. ?Blurry vision. ?Mood changes, such as irritability or anxiety. ?Tingling or numbness around the mouth, lips, or tongue. ?Moderate hypoglycemia ?Confusion and poor judgment. ?Behavior changes. ?Weakness. ?Irregular heartbeat. ?A change in coordination. ?Severe hypoglycemia ?Severe hypoglycemia is a medical emergency. It can cause: ?Fainting. ?Seizures. ?Loss of consciousness (coma). ?Death. ?How is this diagnosed? ?Hypoglycemia is diagnosed with a blood test to measure your blood glucose level. This blood test is done while you are having symptoms. Your health  care provider may also do a physical exam and review  your medical history. ?How is this treated? ?This condition can be treated by immediately eating or drinking something that contains sugar with 15 grams of fast-acting carbohydrate, such as: ?4 oz (120 mL) of fruit juice. ?4 oz (120 mL) of regular soda (not diet soda). ?Several pieces of hard candy. Check food labels to find out how many pieces to eat for 15 grams. ?1 Tbsp (15 mL) of sugar or honey. ?4 glucose tablets. ?1 tube of glucose gel. ?Treating hypoglycemia if you have diabetes ?If you are alert and able to swallow safely, follow the 15:15 rule: ?Take 15 grams of a fast-acting carbohydrate. Talk with your health care provider about how much you should take. Options for getting 15 grams of fast-acting carbohydrate include: ?Glucose tablets (take 4 tablets). ?Several pieces of hard candy. Check food labels to find out how many pieces to eat for 15 grams. ?4 oz (120 mL) of fruit juice. ?4 oz (120 mL) of regular soda (not diet soda). ?1 Tbsp (15 mL) of sugar or honey. ?1 tube of glucose gel. ?Check your blood glucose 15 minutes after you take the carbohydrate. ?If the repeat blood glucose level is still at or below 70 mg/dL (3.9 mmol/L), take 15 grams of a carbohydrate again. ?If your blood glucose level does not increase above 70 mg/dL (3.9 mmol/L) after 3 tries, seek emergency medical care. ?After your blood glucose level returns to normal, eat a meal or a snack within 1 hour. ? ?Treating severe hypoglycemia ?Severe hypoglycemia is when your blood glucose level is below 54 mg/dL (3 mmol/L). Severe hypoglycemia is a medical emergency. Get medical help right away. ?If you have severe hypoglycemia and you cannot eat or drink, you will need to be given glucagon. A family member or close friend should learn how to check your blood glucose and how to give you glucagon. Ask your health care provider if you need to have an emergency glucagon kit available. ?Severe hypoglycemia may need to be treated in a hospital. The  treatment may include getting glucose through an IV. You may also need treatment for the cause of your hypoglycemia. ?Follow these instructions at home: ?General instructions ?Take over-the-counter and prescription medicines only as told by your health care provider. ?Monitor your blood glucose as told by your health care provider. ?If you drink alcohol: ?Limit how much you have to: ?0-1 drink a day for women who are not pregnant. ?0-2 drinks a day for men. ?Know how much alcohol is in your drink. In the [place] one drink equals one 12 oz bottle of beer (355 mL), one 5 oz glass of wine (148 mL), or one 1? oz glass of hard liquor (44 mL). ?Be sure to eat food along with drinking alcohol. ?Be aware that alcohol is absorbed quickly and may have lingering effects that may result in hypoglycemia later. Be sure to do ongoing glucose monitoring. ?Keep all follow-up visits. This is important. ?If you have diabetes: ?Always have a fast-acting carbohydrate (15 grams) option with you to treat low blood glucose. ?Follow your diabetes management plan as directed by your health care provider. Make sure you: ?Know the symptoms of hypoglycemia. It is important to treat it right away to prevent it from becoming severe. ?Check your blood glucose as often as told. Always check before and after exercise. ?Always check your blood glucose before you drive a motorized vehicle. ?Take your medicines as told. ?Follow your meal  plan. Eat on time, and do not skip meals. ?Share your diabetes management plan with people in your workplace, school, and household. ?Carry a medical alert card or wear medical alert jewelry. ?Where to find more information ?American Diabetes Association: www.diabetes.org ?Contact a health care provider if: ?You have problems keeping your blood glucose in your target range. ?You have frequent episodes of hypoglycemia. ?Get help right away if: ?You continue to have hypoglycemia symptoms after eating or drinking something  that contains 15 grams of fast-acting carbohydrate, and you cannot get your blood glucose above 70 mg/dL (3.9 mmol/L) while following the 15:15 rule. ?Your blood glucose is below 54 mg/dL (3 mmol/L). ?Hope Budds

## 2021-09-26 ENCOUNTER — Other Ambulatory Visit: Payer: Self-pay | Admitting: Physician Assistant

## 2021-09-26 DIAGNOSIS — E785 Hyperlipidemia, unspecified: Secondary | ICD-10-CM

## 2021-09-26 DIAGNOSIS — N1832 Chronic kidney disease, stage 3b: Secondary | ICD-10-CM | POA: Diagnosis not present

## 2021-09-26 DIAGNOSIS — E1122 Type 2 diabetes mellitus with diabetic chronic kidney disease: Secondary | ICD-10-CM | POA: Diagnosis not present

## 2021-09-26 DIAGNOSIS — I129 Hypertensive chronic kidney disease with stage 1 through stage 4 chronic kidney disease, or unspecified chronic kidney disease: Secondary | ICD-10-CM | POA: Diagnosis not present

## 2021-09-26 DIAGNOSIS — I10 Essential (primary) hypertension: Secondary | ICD-10-CM

## 2021-10-13 ENCOUNTER — Other Ambulatory Visit: Payer: Self-pay | Admitting: Physician Assistant

## 2021-10-13 DIAGNOSIS — G8929 Other chronic pain: Secondary | ICD-10-CM

## 2021-10-13 DIAGNOSIS — M722 Plantar fascial fibromatosis: Secondary | ICD-10-CM

## 2021-10-13 DIAGNOSIS — M549 Dorsalgia, unspecified: Secondary | ICD-10-CM

## 2021-10-16 ENCOUNTER — Telehealth: Payer: Self-pay

## 2021-10-16 NOTE — Telephone Encounter (Addendum)
Initiated Prior authorization HWE:[id number] '40MG'$  tablets ?Via: Covermymeds ?Case/Key:[id number] ?Status: approved as of 10/16/21 ?Reason:is approved through 06/28/2022 under your Medicare Part ?D benefit.  ?Notified Pt via: Mychart ?

## 2021-11-06 ENCOUNTER — Other Ambulatory Visit: Payer: Self-pay | Admitting: Physician Assistant

## 2021-11-06 DIAGNOSIS — E119 Type 2 diabetes mellitus without complications: Secondary | ICD-10-CM

## 2021-11-19 ENCOUNTER — Ambulatory Visit (INDEPENDENT_AMBULATORY_CARE_PROVIDER_SITE_OTHER): Payer: Medicare Other

## 2021-11-19 DIAGNOSIS — I10 Essential (primary) hypertension: Secondary | ICD-10-CM

## 2021-11-19 DIAGNOSIS — E119 Type 2 diabetes mellitus without complications: Secondary | ICD-10-CM

## 2021-11-19 NOTE — Patient Instructions (Signed)
Visit Information  Thank you for taking time to visit with me today. Please don't hesitate to contact me if I can be of assistance to you before our next scheduled telephone appointment.  Following are the goals we discussed today:  Patient Goals/Self-Care Activities: Take medications as prescribed   check blood sugars at least 2-3 times/week and check blood sugar if you feel it is too high or too low. Take blood sugar log/meter to all provider visits continue to keep track of blood pressure notify provider if blood pressure is consistently outside of recommended parameters continue to remain active/exercise per provider recommendations continue to attend provider visits as scheduled. continue to eat healthy meals: fruits, vegetables, lean meats, healthy fats, low salt. Manage portion size. Limit processed foods and avoid foods with saturated fats and transfats  Our next appointment is by telephone on 01/21/22 at 10:00 am  Please call the care guide team at [phone] if you need to cancel or reschedule your appointment.   If you are experiencing a Mental Health or [place] or need someone to talk to, please call the Suicide and Crisis Lifeline: 988 call 1-800-273-TALK (toll free, 24 hour hotline)   Patient verbalizes understanding of instructions and care plan provided today and agrees to view in [place]. Active MyChart status and patient understanding of how to access instructions and care plan via MyChart confirmed with patient.     Thea Silversmith, RN, MSN, BSN, CCM Care Management Coordinator [facility] [phone]

## 2021-11-19 NOTE — Chronic Care Management (AMB) (Signed)
Chronic Care Management   CCM RN Visit Note  11/19/2021 Name: Bonnie Torres MRN: [id number] DOB: 11-20-1955  Subjective: Bonnie Torres is a 66 y.o. year old female who is a primary care patient of Bonnie Stade, PA-C. The care management team was consulted for assistance with disease management and care coordination needs.    Engaged with patient by telephone for follow up visit in response to provider referral for case management and/or care coordination services.   Consent to Services:  The patient was given information about Chronic Care Management services, agreed to services, and gave verbal consent prior to initiation of services.  Please see initial visit note for detailed documentation.   Patient agreed to services and verbal consent obtained.   Assessment: Review of patient past medical history, allergies, medications, health status, including review of consultants reports, laboratory and other test data, was performed as part of comprehensive evaluation and provision of chronic care management services.   SDOH (Social Determinants of Health) assessments and interventions performed:    CCM Care Plan  No Known Allergies  Outpatient Encounter Medications as of 11/19/2021  Medication Sig   atorvastatin (LIPITOR) 80 MG tablet Take 1 tablet (80 mg total) by mouth daily.   clindamycin (CLEOCIN T) 1 % external solution Apply 1 application topically 2 (two) times daily.   cyclobenzaprine (FLEXERIL) 10 MG tablet TAKE 1 TABLET BY MOUTH 3  TIMES DAILY AS NEEDED FOR  MUSCLE SPASM(S)   diclofenac Sodium (VOLTAREN) 1 % GEL Apply 4 g topically 4 (four) times daily. To affected joint.   diclofenac Sodium (VOLTAREN) 1 % GEL APPLY 4 GRAMS TOPICALLY 4  TIMES DAILY TO LOWER  EXTREMITY JOINT.   MANUFACTURER RECOMMENDS NOT EXCEEDING 32 GRAMS/DAY   hydroquinone 4 % cream Apply topically 2 (two) times daily.   metFORMIN (GLUCOPHAGE-XR) 750 MG 24 hr tablet Take 1 tablet (750 mg total) by mouth  daily with breakfast. Needs labs   metoprolol succinate (TOPROL-XL) 100 MG 24 hr tablet Take 1 tablet (100 mg total) by mouth daily.   Olmesartan-amLODIPine-HCTZ 20-5-12.5 MG TABS TAKE 1 TABLET BY MOUTH  DAILY   potassium chloride SA (KLOR-CON M) 20 MEQ tablet Take 1 tablet (20 mEq total) by mouth daily.   tacrolimus (PROTOPIC) 0.1 % ointment SMARTSIG:1 Topical Daily PRN   tretinoin (RETIN-A) 0.05 % cream Apply 1 application topically at bedtime.    zolpidem (AMBIEN) 10 MG tablet Take 1 tablet (10 mg total) by mouth at bedtime.   Blood Glucose Monitoring Suppl ([place] FLEX SYSTEM) w/Device KIT Check fasting blood sugar every morning. DM ICD10 E11.9   febuxostat (ULORIC) 40 MG tablet Take 1 tablet (40 mg total) by mouth daily. (Patient not taking: Reported on 09/24/2021)   Lancets (ONETOUCH DELICA PLUS [id number]) MISC USE TO CHECK BLOOD SUGAR  TWICE DAILY   ONETOUCH VERIO test strip CHECK BLOOD GLUCOSE TWICE  DAILY AS DIRECTED AS NEEDED   No facility-administered encounter medications on file as of 11/19/2021.    Patient Active Problem List   Diagnosis Date Noted   Bilateral primary osteoarthritis of knee 08/27/2021   Acute pain of right knee 08/26/2021   DDD (degenerative disc disease), lumbar 11/12/2020   Stage 3b chronic kidney disease ([place]) 03/12/2020   Bilateral plantar fasciitis 01/29/2020   neoplasm left kidney 01/29/2020   Hx of partial nephrectomy 01/29/2020   Neoplasm of left kidney 11/30/2019   Cubital tunnel syndrome, left 11/20/2019   Personal history of gout 01/25/2019  Acute gout of right foot 01/25/2019   Elevated uric acid in blood 01/25/2019   Mid back pain on left side 07/29/2018   Irregular heart rate 04/06/2018   Hypotension 04/06/2018   Elevated serum creatinine 12/07/2017   Type 2 diabetes mellitus without complication, without long-term current use of insulin ([place]) 12/03/2017   Class 1 obesity due to excess calories without serious comorbidity with body  mass index (BMI) of 33.0 to 33.9 in adult 12/03/2017   Trigger middle finger of left hand 12/01/2017   Anterior cervical adenopathy 05/19/2017   Hypokalemia 04/02/2017   Obesity (BMI 30.0-34.9) 09/16/2016   Hot flashes due to surgical menopause 08/19/2016   Lymphangioma 06/18/2016   Uterine mass 04/27/2016   Mass of kidney 04/05/2016   Elevated testosterone level in female 03/25/2016   Breast calcification, right 11/13/2015   Primary gout 10/16/2015   Bilateral low back pain without sciatica 04/17/2015   Hyperlipidemia 09/28/2014   Insomnia 08/31/2014   Essential hypertension, benign 08/31/2014    Conditions to be addressed/monitored:HTN, HLD, DMII, and CKD Stage 3  Care Plan : RN Care Management Plan of Care  Updates made by Bonnie Rued, RN since 11/19/2021 12:00 AM     Problem: Chronic Disease Managment Education and/or Care Coordination needs   Priority: High     Long-Range Goal: Development of Plan of Care for Chronic Disease Management and/or Care Coordination needs.   Start Date: 06/11/2021  Expected End Date: 02/09/2022  Priority: High  Note:   Current Barriers:  11/19/21 Bonnie Torres reports she is doing ok. She reports she has not been checking her blood pressure, but reports it is checked at her dentist office and she reports has been several times since 08/2021. She states her blood pressures "have been good-less than 143/70's". She does not check BS routinely, but reports blood sugar on yesterday was 93. She denies any signs/symptoms of hypoglycemia. She is without questions or concerns at this time. Knowledge Deficits related to plan of care for management of HTN, HLD, DMII, and CKD Stage 3  Chronic Disease Management support and education needs related to HTN, HLD, DMII, and CKD Stage 3  RNCM Clinical Goal(s):  Patient will verbalize understanding of plan for management of HTN, HLD, DMII, and CKD Stage 3 as evidenced by self report and/or chart  notiation demonstrate ongoing adherence to prescribed treatment plan for HTN, HLD, DMII, and CKD Stage 3 as evidenced by attending provider visits as scheduled, taking medications, following up with provider with questions or concerns  through collaboration with RN Care manager, provider, and care team.   Interventions: 1:1 collaboration with primary care provider regarding development and update of comprehensive plan of care as evidenced by provider attestation and co-signature Inter-disciplinary care team collaboration (see longitudinal plan of care) Evaluation of current treatment plan related to  self management and patient's adherence to plan as established by provider   HTN/Chronic Kidney Disease Interventions:  (Status:  Goal on track:  Yes.) Long Term Goal Evaluation of current treatment plan related to chronic kidney disease self management and patient's adherence to plan as established by provider      Discussed upcoming/scheduled appointments Encouraged to continue to eat healthy, monitor salt intake Discussed blood pressure readings and encouraged goal of BP <140/90 Home BP Reading: none Per patient-BP taken at the dentist office  < 143/70 Last practice recorded BP readings:  BP Readings from Last 3 Encounters:  08/25/21 107/73  07/21/21 105/65  06/17/21 120/69  Most  recent eGFR/CrCl: No results found for: EGFR  No components found for: CRCL  Diabetes Interventions:  (Status:  Goal on track:  Yes.) Long Term Goal Last checked was 1 1/2 weeks ago: 96 about 1 1/2 week  Assessed patient's understanding of A1c goal: <7% Reviewed medications with patient and discussed importance of medication adherence Reiterated the importance of keeping track of blood sugar readings Encouraged patient to continue to check at least 2-3 times/week and if feeling signs/symptoms of low or high blood sugar Discussed signs/symptoms of hypoglycemia Lab Results  Component Value Date   HGBA1C 5.8 (A)  06/17/2021  Hyperlipidemia Interventions:  (Status:  Goal on track:  Yes.) Long Term Goal Lipid Panel     Component Value Date/Time   CHOL 156 06/17/2021 0000   TRIG 84 06/17/2021 0000   HDL 45 (L) 06/17/2021 0000   CHOLHDL 3.5 06/17/2021 0000   VLDL 29 10/14/2015 1028   LDLCALC 94 06/17/2021 0000  Medication reviewed and encouraged to take as prescribed  Encouraged to continue to attend provider visits as scheduled Encouraged to continue to eat healthy: low cholesterol, limit processed food  Patient Goals/Self-Care Activities: Take medications as prescribed   check blood sugars at least 2-3 times/week and check blood sugar if you feel it is too high or too low. Take blood sugar log/meter to all provider visits continue to keep track of blood pressure notify provider if blood pressure is consistently outside of recommended parameters continue to remain active/exercise per provider recommendations continue to attend provider visits as scheduled. continue to eat healthy meals: fruits, vegetables, lean meats, healthy fats, low salt. Manage portion size. Limit processed foods and avoid foods with saturated fats and transfats   Plan:Telephone follow up appointment with care management team member scheduled for:  01/21/22 The patient has been provided with contact information for the care management team and has been advised to call with any health related questions or concerns.   Thea Silversmith, RN, MSN, BSN, CCM Care Management Coordinator [facility] [phone]

## 2021-11-20 LAB — HEMOGLOBIN A1C: Hemoglobin A1C: 6.6

## 2021-11-20 LAB — HM DIABETES FOOT EXAM: HM Diabetic Foot Exam: NORMAL

## 2021-11-26 ENCOUNTER — Telehealth: Payer: Medicare Other

## 2021-11-26 DIAGNOSIS — E785 Hyperlipidemia, unspecified: Secondary | ICD-10-CM

## 2021-11-26 DIAGNOSIS — N1832 Chronic kidney disease, stage 3b: Secondary | ICD-10-CM

## 2021-11-26 DIAGNOSIS — Z7984 Long term (current) use of oral hypoglycemic drugs: Secondary | ICD-10-CM

## 2021-11-26 DIAGNOSIS — E1122 Type 2 diabetes mellitus with diabetic chronic kidney disease: Secondary | ICD-10-CM

## 2021-11-26 DIAGNOSIS — I129 Hypertensive chronic kidney disease with stage 1 through stage 4 chronic kidney disease, or unspecified chronic kidney disease: Secondary | ICD-10-CM

## 2021-12-15 DIAGNOSIS — L700 Acne vulgaris: Secondary | ICD-10-CM | POA: Diagnosis not present

## 2021-12-15 DIAGNOSIS — L658 Other specified nonscarring hair loss: Secondary | ICD-10-CM | POA: Diagnosis not present

## 2021-12-16 ENCOUNTER — Encounter: Payer: Self-pay | Admitting: Physician Assistant

## 2021-12-16 ENCOUNTER — Ambulatory Visit (INDEPENDENT_AMBULATORY_CARE_PROVIDER_SITE_OTHER): Payer: Medicare Other | Admitting: Physician Assistant

## 2021-12-16 VITALS — BP 118/73 | HR 74 | Ht <= 58 in | Wt 158.0 lb

## 2021-12-16 DIAGNOSIS — E782 Mixed hyperlipidemia: Secondary | ICD-10-CM | POA: Diagnosis not present

## 2021-12-16 DIAGNOSIS — N1832 Chronic kidney disease, stage 3b: Secondary | ICD-10-CM

## 2021-12-16 DIAGNOSIS — F5101 Primary insomnia: Secondary | ICD-10-CM | POA: Diagnosis not present

## 2021-12-16 DIAGNOSIS — I10 Essential (primary) hypertension: Secondary | ICD-10-CM

## 2021-12-16 DIAGNOSIS — M1A0610 Idiopathic chronic gout, right knee, without tophus (tophi): Secondary | ICD-10-CM | POA: Diagnosis not present

## 2021-12-16 DIAGNOSIS — E119 Type 2 diabetes mellitus without complications: Secondary | ICD-10-CM | POA: Diagnosis not present

## 2021-12-16 LAB — POCT GLYCOSYLATED HEMOGLOBIN (HGB A1C): HbA1c, POC (controlled diabetic range): 6.1 % (ref 0.0–7.0)

## 2021-12-16 MED ORDER — OLMESARTAN-AMLODIPINE-HCTZ 20-5-12.5 MG PO TABS: 1.0000 | ORAL_TABLET | Freq: Every day | ORAL | 3 refills | Status: DC

## 2021-12-16 MED ORDER — ZOLPIDEM TARTRATE 10 MG PO TABS: 10.0000 mg | ORAL_TABLET | Freq: Every day | ORAL | 1 refills | Status: DC

## 2021-12-16 MED ORDER — METFORMIN HCL ER 750 MG PO TB24: 750.0000 mg | ORAL_TABLET | Freq: Every day | ORAL | 3 refills | Status: DC

## 2021-12-16 MED ORDER — METOPROLOL SUCCINATE ER 100 MG PO TB24: 100.0000 mg | ORAL_TABLET | Freq: Every day | ORAL | 3 refills | Status: DC

## 2021-12-16 NOTE — Progress Notes (Signed)
Established Patient Office Visit  Subjective   Patient ID: Bonnie Torres, female    DOB: July 17, 1955  Age: 66 y.o. MRN: [id number]  Chief Complaint  Patient presents with   Follow-up   Diabetes    Diabetes Pertinent negatives for diabetes include no blurred vision, no chest pain and no weakness.    Bonnie Torres is a 66 year old female with a history of T2DM, HTN, CKD Stage 3B, Lumbar DDD, HLD presenting for 6 month follow up of T2DM and HTN management. She has been doing well. Reports no medical concerns. She has been checking her blood sugars every so often, but cannot remember what they are running. Did not take Uloric as she was concerned about strokes and heart attacks. Denies any visual changes, neuropathy, palpitations, shortness of breath, edema. She has not had any more gout flares. She is trying to control with diet.   She is sleeping well with ambien.   .. Active Ambulatory Problems    Diagnosis Date Noted   Insomnia 08/31/2014   Essential hypertension, benign 08/31/2014   Hyperlipidemia 09/28/2014   Bilateral low back pain without sciatica 04/17/2015   Primary gout 10/16/2015   Breast calcification, right 11/13/2015   Elevated testosterone level in female 03/25/2016   Mass of kidney 04/05/2016   Uterine mass 04/27/2016   Lymphangioma 06/18/2016   Hot flashes due to surgical menopause 08/19/2016   Obesity (BMI 30.0-34.9) 09/16/2016   Hypokalemia 04/02/2017   Anterior cervical adenopathy 05/19/2017   Trigger middle finger of left hand 12/01/2017   Type 2 diabetes mellitus without complication, without long-term current use of insulin ([place]) 12/03/2017   Class 1 obesity due to excess calories without serious comorbidity with body mass index (BMI) of 33.0 to 33.9 in adult 12/03/2017   Elevated serum creatinine 12/07/2017   Irregular heart rate 04/06/2018   Hypotension 04/06/2018   Mid back pain on left side 07/29/2018   Personal history of gout 01/25/2019    Acute gout of right foot 01/25/2019   Elevated uric acid in blood 01/25/2019   Cubital tunnel syndrome, left 11/20/2019   Neoplasm of left kidney 11/30/2019   Bilateral plantar fasciitis 01/29/2020   neoplasm left kidney 01/29/2020   Hx of partial nephrectomy 01/29/2020   Stage 3b chronic kidney disease ([place]) 03/12/2020   DDD (degenerative disc disease), lumbar 11/12/2020   Acute pain of right knee 08/26/2021   Bilateral primary osteoarthritis of knee 08/27/2021   Idiopathic chronic gout of right knee without tophus 12/16/2021   Resolved Ambulatory Problems    Diagnosis Date Noted   Mass of omentum 04/05/2016   Hirsutism 04/05/2016   Mass of ovary 04/05/2016   Renal cell carcinoma ([place]) 08/18/2016   Past Medical History:  Diagnosis Date   Complication of anesthesia    Gout    Heart rate fast    Hypertension    PONV (postoperative nausea and vomiting)    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  Eyes:  Negative for blurred vision, double vision and pain.  Respiratory:  Negative for cough, shortness of breath and wheezing.   Cardiovascular:  Negative for chest pain, palpitations and leg swelling.  Gastrointestinal:  Negative for abdominal pain, constipation, diarrhea, nausea and vomiting.  Genitourinary:  Negative for dysuria, frequency and urgency.  Musculoskeletal:  Negative for falls.  Neurological:  Negative for tingling, sensory change and weakness.  All other systems  reviewed and are negative.     Objective:     BP 118/73   Pulse 74   Ht '4\' 10"'$  (1.473 m)   Wt 158 lb (71.7 kg)   SpO2 99%   BMI 33.02 kg/m  BP Readings from Last 3 Encounters:  12/16/21 118/73  08/25/21 107/73  07/21/21 105/65   Wt Readings from Last 3 Encounters:  12/16/21 158 lb (71.7 kg)  08/25/21 158 lb (71.7 kg)  07/21/21 160 lb 0.6 oz (72.6 kg)      Physical Exam Vitals reviewed.   Constitutional:      General: She is not in acute distress.    Appearance: Normal appearance. She is obese. She is not ill-appearing.  Eyes:     General: No scleral icterus.    Extraocular Movements: Extraocular movements intact.     Conjunctiva/sclera: Conjunctivae normal.  Cardiovascular:     Rate and Rhythm: Normal rate and regular rhythm.     Pulses: Normal pulses.     Heart sounds: Normal heart sounds.  Pulmonary:     Effort: Pulmonary effort is normal.     Breath sounds: Normal breath sounds.  Abdominal:     Palpations: Abdomen is soft. There is no mass.     Tenderness: There is no abdominal tenderness. There is no guarding.  Musculoskeletal:     Cervical back: Neck supple. No tenderness.     Right lower leg: No edema.     Left lower leg: No edema.  Lymphadenopathy:     Cervical: No cervical adenopathy.  Skin:    General: Skin is warm and dry.     Capillary Refill: Capillary refill takes less than 2 seconds.  Neurological:     Mental Status: She is alert and oriented to person, place, and time.     Sensory: No sensory deficit.  Psychiatric:        Mood and Affect: Mood normal.        Behavior: Behavior normal.      Results for orders placed or performed in visit on 12/16/21  POCT glycosylated hemoglobin (Hb A1C)  Result Value Ref Range   Hemoglobin A1C     HbA1c POC (<> result, manual entry)     HbA1c, POC (prediabetic range)     HbA1c, POC (controlled diabetic range) 6.1 0.0 - 7.0 %        Assessment & Plan:   Bonnie Torres was seen today for follow-up and diabetes.  Diagnoses and all orders for this visit:  Type 2 diabetes mellitus without complication, without long-term current use of insulin ([facility]) -     POCT glycosylated hemoglobin (Hb A1C) -     metFORMIN (GLUCOPHAGE-XR) 750 MG 24 hr tablet; Take 1 tablet (750 mg total) by mouth daily with breakfast.  Stage 3b chronic kidney disease ([place])  Primary insomnia -     zolpidem (AMBIEN) 10 MG tablet; Take 1  tablet (10 mg total) by mouth at bedtime.  Mixed hyperlipidemia  Essential hypertension, benign -     metoprolol succinate (TOPROL-XL) 100 MG 24 hr tablet; Take 1 tablet (100 mg total) by mouth daily. -     Olmesartan-amLODIPine-HCTZ 20-5-12.5 MG TABS; Take 1 tablet by mouth daily.  Idiopathic chronic gout of right knee without tophus   Patient is doing well, no medical complaints.  She did not take the Uloric, so will forego checking Uric acid level as it will be high. Educated on diet modification and foods to avoid.  Medications refilled.  A1C 6.1 Continue on same medications BP to goal on ARB On statin  Foot exam normal Ophthalmology UTD Vaccines UTD  Ambien refilled for insomnia.    Return in about 3 months (around 03/18/2022).    Iran Planas, PA-C

## 2021-12-17 ENCOUNTER — Other Ambulatory Visit: Payer: Self-pay | Admitting: Physician Assistant

## 2021-12-17 DIAGNOSIS — E119 Type 2 diabetes mellitus without complications: Secondary | ICD-10-CM

## 2021-12-24 ENCOUNTER — Ambulatory Visit (INDEPENDENT_AMBULATORY_CARE_PROVIDER_SITE_OTHER): Payer: Medicare Other

## 2021-12-24 ENCOUNTER — Telehealth: Payer: Self-pay

## 2021-12-24 DIAGNOSIS — E782 Mixed hyperlipidemia: Secondary | ICD-10-CM

## 2021-12-24 DIAGNOSIS — I10 Essential (primary) hypertension: Secondary | ICD-10-CM

## 2021-12-24 DIAGNOSIS — N1832 Chronic kidney disease, stage 3b: Secondary | ICD-10-CM

## 2021-12-24 DIAGNOSIS — E119 Type 2 diabetes mellitus without complications: Secondary | ICD-10-CM

## 2021-12-24 NOTE — Chronic Care Management (AMB) (Signed)
Chronic Care Management   CCM RN Visit Note  12/24/2021 Name: Bonnie Torres MRN: [id number] DOB: 1955-12-27  Subjective: Bonnie Torres is a 66 y.o. year old female who is a primary care patient of Bonnie Stade, PA-C. The care management team was consulted for assistance with disease management and care coordination needs.    Engaged with patient by telephone for follow up visit in response to provider referral for case management and/or care coordination services.   Consent to Services:  The patient was given information about Chronic Care Management services, agreed to services, and gave verbal consent prior to initiation of services.  Please see initial visit note for detailed documentation.   Patient agreed to services and verbal consent obtained.   Assessment: Review of patient past medical history, allergies, medications, health status, including review of consultants reports, laboratory and other test data, was performed as part of comprehensive evaluation and provision of chronic care management services.   SDOH (Social Determinants of Health) assessments and interventions performed:    CCM Care Plan  No Known Allergies  Outpatient Encounter Medications as of 12/24/2021  Medication Sig   atorvastatin (LIPITOR) 80 MG tablet Take 1 tablet (80 mg total) by mouth daily.   Blood Glucose Monitoring Suppl ([employer] FLEX SYSTEM) w/Device KIT Check fasting blood sugar every morning. DM ICD10 E11.9   clindamycin (CLEOCIN T) 1 % external solution Apply 1 application topically 2 (two) times daily.   diclofenac Sodium (VOLTAREN) 1 % GEL Apply 4 g topically 4 (four) times daily. To affected joint.   hydroquinone 4 % cream Apply topically 2 (two) times daily.   Lancets (ONETOUCH DELICA PLUS [id number]) MISC CHECK BLOOD SUGAR TWICE  DAILY   metFORMIN (GLUCOPHAGE-XR) 750 MG 24 hr tablet Take 1 tablet (750 mg total) by mouth daily with breakfast.   metoprolol succinate (TOPROL-XL)  100 MG 24 hr tablet Take 1 tablet (100 mg total) by mouth daily.   Olmesartan-amLODIPine-HCTZ 20-5-12.5 MG TABS Take 1 tablet by mouth daily.   ONETOUCH VERIO test strip CHECK BLOOD GLUCOSE TWICE  DAILY AS DIRECTED AS NEEDED   potassium chloride SA (KLOR-CON M) 20 MEQ tablet Take 1 tablet (20 mEq total) by mouth daily.   tacrolimus (PROTOPIC) 0.1 % ointment SMARTSIG:1 Topical Daily PRN   tretinoin (RETIN-A) 0.05 % cream Apply 1 application topically at bedtime.    zolpidem (AMBIEN) 10 MG tablet Take 1 tablet (10 mg total) by mouth at bedtime.   No facility-administered encounter medications on file as of 12/24/2021.    Patient Active Problem List   Diagnosis Date Noted   Idiopathic chronic gout of right knee without tophus 12/16/2021   Bilateral primary osteoarthritis of knee 08/27/2021   Acute pain of right knee 08/26/2021   DDD (degenerative disc disease), lumbar 11/12/2020   Stage 3b chronic kidney disease ([place]) 03/12/2020   Bilateral plantar fasciitis 01/29/2020   neoplasm left kidney 01/29/2020   Hx of partial nephrectomy 01/29/2020   Neoplasm of left kidney 11/30/2019   Cubital tunnel syndrome, left 11/20/2019   Personal history of gout 01/25/2019   Acute gout of right foot 01/25/2019   Elevated uric acid in blood 01/25/2019   Mid back pain on left side 07/29/2018   Irregular heart rate 04/06/2018   Hypotension 04/06/2018   Elevated serum creatinine 12/07/2017   Type 2 diabetes mellitus without complication, without long-term current use of insulin ([place]) 12/03/2017   Class 1 obesity due to excess calories without serious comorbidity  with body mass index (BMI) of 33.0 to 33.9 in adult 12/03/2017   Trigger middle finger of left hand 12/01/2017   Anterior cervical adenopathy 05/19/2017   Hypokalemia 04/02/2017   Obesity (BMI 30.0-34.9) 09/16/2016   Hot flashes due to surgical menopause 08/19/2016   Lymphangioma 06/18/2016   Uterine mass 04/27/2016   Mass of kidney 04/05/2016    Elevated testosterone level in female 03/25/2016   Breast calcification, right 11/13/2015   Primary gout 10/16/2015   Bilateral low back pain without sciatica 04/17/2015   Hyperlipidemia 09/28/2014   Insomnia 08/31/2014   Essential hypertension, benign 08/31/2014    Conditions to be addressed/monitored:HTN, HLD, and DMII  Care Plan : RN Care Management Plan of Care  Updates made by Luretha Rued, RN since 12/24/2021 12:00 AM  Completed 12/24/2021   Problem: Chronic Disease Management Education and/or Care Coordination needs Resolved 12/24/2021  Priority: High     Long-Range Goal: Development of Plan of Care for Chronic Disease Management and/or Care Coordination needs. Completed 12/24/2021  Start Date: 06/11/2021  Expected End Date: 02/09/2022  Priority: High  Note:   Current Barriers:  12/24/21 Bonnie Torres reports she is doing well. Currently vacationing at the beach with her daughter. She reports blood pressure has been good and states she checked blood sugar prior to going to the beach this week and it was 98. She has her medications and attends provider visits as scheduled. She denies any questions or concerns. RNCM discussed case closure. Patient is in agreement.  Blood sugar 98 within the past week. Blood pressure Knowledge Deficits related to plan of care for management of HTN, HLD, DMII, and CKD Stage 3  Chronic Disease Management support and education needs related to HTN, HLD, DMII, and CKD Stage 3  RNCM Clinical Goal(s):  Patient will verbalize understanding of plan for management of HTN, HLD, DMII, and CKD Stage 3 as evidenced by self report and/or chart notiation demonstrate ongoing adherence to prescribed treatment plan for HTN, HLD, DMII, and CKD Stage 3 as evidenced by attending provider visits as scheduled, taking medications, following up with provider with questions or concerns  through collaboration with RN Care manager, provider, and care team.    Interventions: 1:1 collaboration with primary care provider regarding development and update of comprehensive plan of care as evidenced by provider attestation and co-signature Inter-disciplinary care team collaboration (see longitudinal plan of care) Evaluation of current treatment plan related to  self management and patient's adherence to plan as established by provider   HTN/Chronic Kidney Disease Interventions:  (Status:  Goal Met.) Long Term Goal Evaluation of current treatment plan related to chronic kidney disease self management and patient's adherence to plan as established by provider      Encouraged to continue to eat healthy, monitor salt intake Discussed blood pressure readings and encouraged goal of BP <140/90  Last practice recorded BP readings:  BP Readings from Last 3 Encounters:  12/16/21 118/73  08/25/21 107/73  07/21/21 105/65  Most recent eGFR/CrCl: No results found for: EGFR  No components found for: CRCL  Diabetes Interventions:  (Status:  Goal Met.) Long Term Goal Last checked was 1 1/2 weeks ago: 59 about 1 1/2 week  Assessed patient's understanding of A1c goal: <7% Reviewed medications with patient and discussed importance of medication adherence Reiterated the importance of good blood sugar management in overall health Encouraged patient to continue to check at least 2-3 times/week and if feeling signs/symptoms of low or high blood sugar  Lab Results  Component Value Date   HGBA1C 6.1 12/16/2021  Hyperlipidemia Interventions:  (Status:  Goal on track:  Yes.) Long Term Goal Lipid Panel     Component Value Date/Time   CHOL 156 06/17/2021 0000   TRIG 84 06/17/2021 0000   HDL 45 (L) 06/17/2021 0000   CHOLHDL 3.5 06/17/2021 0000   VLDL 29 10/14/2015 1028   LDLCALC 94 06/17/2021 0000  Medication reviewed and encouraged to take as prescribed  Encouraged to continue to attend provider visits as scheduled Reiterated the importance of healthy eating and  minimizing transfats and saturated fats  Patient Goals/Self-Care Activities: Take medications as prescribed   check blood sugars at least 2-3 times/week and check blood sugar if you feel it is too high or too low. Take blood sugar log/meter to all provider visits continue to keep track of blood pressure notify provider if blood pressure is consistently outside of recommended parameters continue to remain active/exercise per provider recommendations continue to attend provider visits as scheduled. continue to eat healthy meals: fruits, vegetables, lean meats, healthy fats, low salt. Manage portion size. Limit processed foods and avoid foods with saturated fats and transfats   Plan:No further follow up required: Patient encouraged to contact primary care provider for re-referral if care management needs in the future.  Thea Silversmith, RN, MSN, BSN, CCM Care Management Coordinator [facility] [phone]

## 2021-12-24 NOTE — Telephone Encounter (Signed)
  Care Management   Follow Up Note   12/24/2021 Name: Bonnie Torres MRN: [id number] DOB: 1956/02/03   Referred by: Donella Stade, PA-C Reason for referral : No chief complaint on file.   An unsuccessful telephone outreach was attempted today. The patient was referred to the case management team for assistance with care management and care coordination.   Follow Up Plan: The care management team will reach out to the patient again over the next 30 days.   Thea Silversmith, RN, MSN, BSN, CCM Care Management Coordinator [facility] [phone]

## 2021-12-24 NOTE — Patient Instructions (Signed)
Visit Information  Thank you for allowing me to share the care management and care coordination services that are available to you as part of your health plan and services through your primary care provider and medical home. Please reach out to your primary care provider, if the care management/care coordination team may be of assistance to you in the future.   Thea Silversmith, RN, MSN, BSN, CCM Care Management Coordinator [facility] [phone]

## 2021-12-26 DIAGNOSIS — I129 Hypertensive chronic kidney disease with stage 1 through stage 4 chronic kidney disease, or unspecified chronic kidney disease: Secondary | ICD-10-CM | POA: Diagnosis not present

## 2021-12-26 DIAGNOSIS — E785 Hyperlipidemia, unspecified: Secondary | ICD-10-CM

## 2021-12-26 DIAGNOSIS — E1122 Type 2 diabetes mellitus with diabetic chronic kidney disease: Secondary | ICD-10-CM | POA: Diagnosis not present

## 2021-12-26 DIAGNOSIS — N183 Chronic kidney disease, stage 3 unspecified: Secondary | ICD-10-CM

## 2022-01-02 DIAGNOSIS — Z79899 Other long term (current) drug therapy: Secondary | ICD-10-CM | POA: Diagnosis not present

## 2022-01-02 DIAGNOSIS — L700 Acne vulgaris: Secondary | ICD-10-CM | POA: Diagnosis not present

## 2022-01-13 ENCOUNTER — Ambulatory Visit (INDEPENDENT_AMBULATORY_CARE_PROVIDER_SITE_OTHER): Payer: Medicare Other | Admitting: Physician Assistant

## 2022-01-13 VITALS — BP 111/63 | HR 82 | Ht <= 58 in | Wt 158.0 lb

## 2022-01-13 DIAGNOSIS — L578 Other skin changes due to chronic exposure to nonionizing radiation: Secondary | ICD-10-CM

## 2022-01-13 MED ORDER — METHYLPREDNISOLONE SODIUM SUCC 125 MG IJ SOLR
125.0000 mg | Freq: Once | INTRAMUSCULAR | Status: AC
  Administered 2022-01-13: 125 mg via INTRAMUSCULAR

## 2022-01-13 MED ORDER — TRIAMCINOLONE ACETONIDE 0.1 % EX CREA: 1.0000 | TOPICAL_CREAM | Freq: Two times a day (BID) | CUTANEOUS | 0 refills | Status: DC

## 2022-01-13 NOTE — Progress Notes (Unsigned)
2.5 week Went to beach Rash on arms/chest Itchy  Using Cortisone

## 2022-01-13 NOTE — Patient Instructions (Signed)
Sun Sensitivity Sun sensitivity, also called photosensitivity, is a condition in which exposure to sunlight causes skin irritation. There are many types of sun sensitivity. Some medicines, skin products, and medical conditions can cause sun sensitivity that can range from mild to severe. If medicines or products are causing sun sensitivity, the condition may go away after you stop taking the medicine or using the product. In some cases, sun sensitivity is a long-term (chronic) condition. What are the causes? Common causes of sun sensitivity include: Certain medicines. Certain makeup products (cosmetics), lotions, or medicines that are applied to the skin. Some health conditions, including: Connective tissue diseases, such as lupus. Redness and inflammation of the face or eyes (rosacea). White patches of skin (vitiligo). In some cases, sun sensitivity may be passed from parent to child (inherited), or the cause may not be known. What increases the risk? This condition is more likely to develop in people who: Have family members who have sun sensitivity. Are in sunlight for long periods of time. What are the signs or symptoms? Symptoms of sun sensitivity vary. Common symptoms include: Red or sunburned skin that may be bumpy or form blisters. Hives. Itchy skin (pruritus). Pain where skin is exposed to the sun. Dry, flaky skin. Swelling of the skin. Sunburn or other symptoms often develop after a short amount of sun exposure. How is this diagnosed? This condition is diagnosed based on your symptoms. It is important to talk with your health care provider about your symptoms, including: The timing of your symptoms, such as whether you have symptoms immediately after being in the sun, or after a long period of being in the sun. How long your symptoms last. Where your symptoms occur. Some types of sun sensitivity can be diagnosed with: A skin biopsy. This a test that involves taking a small  sample of skin to be examined under a microscope. A blood test to check for certain medical conditions or genetic factors. Light testing to see how you react to UV (ultraviolet) light. This is the type of light that comes from the sun. How is this treated? This condition is often treated by preventing known causes of sun sensitivity. This may include protecting your skin from the sun and avoiding certain skin products. Your health care provider may recommend that you take an antihistamine medicine or apply prescription cream to affected areas to help relieve symptoms. Follow these instructions at home: Do not break any blisters that you may have. Do not scratch your skin while you have symptoms. This can make symptoms worse. Apply a cool, wet cloth (cool compress) to affected areas. This may help to relieve symptoms. Do not apply ice to your affected skin. Icing can cause further damage. Take over-the-counter and prescription medicines only as told by your health care provider. How is this prevented?  Avoid skin products that cause symptoms, such as certain cosmetics and lotions. Avoid being in the sun while taking medicines that are known to cause sun sensitivity. Ask your health care provider if any medicines that you take may cause sun sensitivity. Avoid the sun when it is strongest, usually between 10 a.m. and 4 p.m. Cover your skin with pants, long sleeves, and a hat when you are exposed to sunlight. Apply sunscreen 15-30 minutes before you go outside, even on cloudy days. Use a sunscreen with an SPF of 30 or higher. Use a waterproof or water-resistant sunscreen that protects against all of the sun's rays (broad-spectrum). Apply a thick coat of sunscreen  over all exposed skin. Typically, a palm-size amount of sunscreen will cover your whole body. Do not use sunscreen on a baby who is younger than 27 months of age. Reapply sunscreen: About every 2 hours, whether it is sunny or cloudy. More  often if you are sweating a lot. After swimming or playing in the water. Contact a health care provider if: Your symptoms do not improve with treatment. Your rash does not go away. Your pain or itching is not controlled with medicine. Your skin becomes more painful and swollen. You have a fever. You have open blisters. Get help right away if: You suddenly develop a rash as well as: Swelling around your eyes or lips. Trouble swallowing. Trouble breathing. You have bleeding underneath skin that was exposed to the sun. You have pus or fluid coming from any blisters. These symptoms may represent a serious problem that is an emergency. Do not wait to see if the symptoms will go away. Get medical help right away. Call your local emergency services (911 in the [place] Do not drive yourself to the hospital. Summary Sun sensitivity, also called photosensitivity, is a condition in which exposure to sunlight causes skin irritation. Some medicines, skin products, and medical conditions can cause sun sensitivity. Avoid being in the sun while taking medicines that are known to cause sun sensitivity. Apply sunscreen 15-30 minutes before you go outside, even on cloudy days. Reapply sunscreen as directed. This information is not intended to replace advice given to you by your health care provider. Make sure you discuss any questions you have with your health care provider. Document Revised: 09/18/2020 Document Reviewed: 09/18/2020 Elsevier Patient Education  [employer].

## 2022-01-13 NOTE — Progress Notes (Signed)
   Acute Office Visit  Subjective:     Patient ID: Bonnie Torres, female    DOB: [birth year], 66 y.o.   MRN: [id number]  Chief Complaint  Patient presents with  . Rash    HPI Patient is in today for ***  ROS      Objective:    BP 111/63   Pulse 82   Ht '4\' 10"'$  (1.473 m)   Wt 158 lb (71.7 kg)   SpO2 100%   BMI 33.02 kg/m  {Vitals History (Optional):23777}  Physical Exam  No results found for any visits on 01/13/22.      Assessment & Plan:   Problem List Items Addressed This Visit   None   No orders of the defined types were placed in this encounter.   No follow-ups on file.  Iran Planas, PA-C

## 2022-01-14 ENCOUNTER — Encounter: Payer: Self-pay | Admitting: Physician Assistant

## 2022-01-21 ENCOUNTER — Telehealth: Payer: Medicare Other

## 2022-02-11 DIAGNOSIS — D49512 Neoplasm of unspecified behavior of left kidney: Secondary | ICD-10-CM | POA: Diagnosis not present

## 2022-02-14 ENCOUNTER — Other Ambulatory Visit: Payer: Self-pay | Admitting: Physician Assistant

## 2022-02-18 DIAGNOSIS — Z85528 Personal history of other malignant neoplasm of kidney: Secondary | ICD-10-CM | POA: Diagnosis not present

## 2022-02-18 DIAGNOSIS — N200 Calculus of kidney: Secondary | ICD-10-CM | POA: Diagnosis not present

## 2022-02-18 DIAGNOSIS — N281 Cyst of kidney, acquired: Secondary | ICD-10-CM | POA: Diagnosis not present

## 2022-02-18 DIAGNOSIS — K449 Diaphragmatic hernia without obstruction or gangrene: Secondary | ICD-10-CM | POA: Diagnosis not present

## 2022-02-25 DIAGNOSIS — D49512 Neoplasm of unspecified behavior of left kidney: Secondary | ICD-10-CM | POA: Diagnosis not present

## 2022-02-25 DIAGNOSIS — Z85528 Personal history of other malignant neoplasm of kidney: Secondary | ICD-10-CM | POA: Diagnosis not present

## 2022-03-10 ENCOUNTER — Other Ambulatory Visit: Payer: Self-pay | Admitting: Physician Assistant

## 2022-03-10 DIAGNOSIS — M25561 Pain in right knee: Secondary | ICD-10-CM

## 2022-03-10 DIAGNOSIS — M549 Dorsalgia, unspecified: Secondary | ICD-10-CM

## 2022-03-10 DIAGNOSIS — G8929 Other chronic pain: Secondary | ICD-10-CM

## 2022-03-10 DIAGNOSIS — Z8739 Personal history of other diseases of the musculoskeletal system and connective tissue: Secondary | ICD-10-CM

## 2022-03-18 ENCOUNTER — Ambulatory Visit (INDEPENDENT_AMBULATORY_CARE_PROVIDER_SITE_OTHER): Payer: Medicare Other | Admitting: Physician Assistant

## 2022-03-18 VITALS — BP 103/64 | HR 73 | Ht <= 58 in | Wt 160.0 lb

## 2022-03-18 DIAGNOSIS — M79672 Pain in left foot: Secondary | ICD-10-CM

## 2022-03-18 DIAGNOSIS — Z23 Encounter for immunization: Secondary | ICD-10-CM

## 2022-03-18 DIAGNOSIS — F5101 Primary insomnia: Secondary | ICD-10-CM

## 2022-03-18 DIAGNOSIS — E119 Type 2 diabetes mellitus without complications: Secondary | ICD-10-CM

## 2022-03-18 DIAGNOSIS — N1831 Chronic kidney disease, stage 3a: Secondary | ICD-10-CM | POA: Diagnosis not present

## 2022-03-18 DIAGNOSIS — E1122 Type 2 diabetes mellitus with diabetic chronic kidney disease: Secondary | ICD-10-CM | POA: Diagnosis not present

## 2022-03-18 DIAGNOSIS — E782 Mixed hyperlipidemia: Secondary | ICD-10-CM | POA: Diagnosis not present

## 2022-03-18 DIAGNOSIS — M79671 Pain in right foot: Secondary | ICD-10-CM

## 2022-03-18 DIAGNOSIS — I10 Essential (primary) hypertension: Secondary | ICD-10-CM | POA: Diagnosis not present

## 2022-03-18 LAB — POCT GLYCOSYLATED HEMOGLOBIN (HGB A1C): HbA1c, POC (controlled diabetic range): 5.7 % (ref 0.0–7.0)

## 2022-03-18 LAB — POCT UA - MICROALBUMIN
Albumin/Creatinine Ratio, Urine, POC: <30
Creatinine, POC: 50 mg/dL
Microalbumin Ur, POC: 10 mg/L

## 2022-03-18 NOTE — Progress Notes (Signed)
Established Patient Office Visit  Subjective   Patient ID: Bonnie Torres, female    DOB: [birth year]  Age: 66 y.o. MRN: [id number]  Chief Complaint  Patient presents with   Follow-up   Diabetes    Diabetes   Pt is a 66 yo F who presents for a 3 month DM f/u. She has a PMH of T2DM, HTN, CKD stage 3b, HLD, renal cell carcinoma. Her sugars have been running 100-120 with no episodes of hypoglycemia. She is compliant with medications. Pt is doing well overall and denies CP, SOB, weakness, vision changes, neuropathy, but complains of both feet aching and feeling tired. She is on her feet at work and would like a podiatry referral for shoe insoles or orthotics. Not tried anything for her feet. No injury.   .. Active Ambulatory Problems    Diagnosis Date Noted   Insomnia 08/31/2014   Essential hypertension, benign 08/31/2014   Hyperlipidemia 09/28/2014   Bilateral low back pain without sciatica 04/17/2015   Primary gout 10/16/2015   Breast calcification, right 11/13/2015   Elevated testosterone level in female 03/25/2016   Mass of kidney 04/05/2016   Uterine mass 04/27/2016   Lymphangioma 06/18/2016   Hot flashes due to surgical menopause 08/19/2016   Obesity (BMI 30.0-34.9) 09/16/2016   Hypokalemia 04/02/2017   Anterior cervical adenopathy 05/19/2017   Trigger middle finger of left hand 12/01/2017   Type 2 diabetes mellitus without complication, without long-term current use of insulin ([place]) 12/03/2017   Class 1 obesity due to excess calories without serious comorbidity with body mass index (BMI) of 33.0 to 33.9 in adult 12/03/2017   Elevated serum creatinine 12/07/2017   Irregular heart rate 04/06/2018   Hypotension 04/06/2018   Mid back pain on left side 07/29/2018   Personal history of gout 01/25/2019   Acute gout of right foot 01/25/2019   Elevated uric acid in blood 01/25/2019   Cubital tunnel syndrome, left 11/20/2019   Neoplasm of left kidney 11/30/2019   Bilateral  plantar fasciitis 01/29/2020   neoplasm left kidney 01/29/2020   Hx of partial nephrectomy 01/29/2020   Stage 3b chronic kidney disease ([place]) 03/12/2020   DDD (degenerative disc disease), lumbar 11/12/2020   Acute pain of right knee 08/26/2021   Bilateral primary osteoarthritis of knee 08/27/2021   Idiopathic chronic gout of right knee without tophus 12/16/2021   Resolved Ambulatory Problems    Diagnosis Date Noted   Mass of omentum 04/05/2016   Hirsutism 04/05/2016   Mass of ovary 04/05/2016   Renal cell carcinoma ([place]) 08/18/2016   Past Medical History:  Diagnosis Date   Complication of anesthesia    Gout    Heart rate fast    Hypertension    PONV (postoperative nausea and vomiting)    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  HENT:     Head: Normocephalic.  Cardiovascular:     Rate and Rhythm: Normal rate and regular rhythm.  Pulses: Normal pulses.     Heart sounds: Normal heart sounds.  Pulmonary:     Effort: Pulmonary effort is normal.     Breath sounds: Normal breath sounds.  Abdominal:     Palpations: Abdomen is soft.  Musculoskeletal:     Right lower leg: No edema.     Left lower leg: No edema.  Neurological:     Mental Status: She is alert and oriented to person, place, and time.  Psychiatric:        Mood and Affect: Mood normal.        Behavior: Behavior normal.      Results for orders placed or performed in visit on 03/18/22  POCT glycosylated hemoglobin (Hb A1C)  Result Value Ref Range   Hemoglobin A1C     HbA1c POC (<> result, manual entry)     HbA1c, POC  (prediabetic range)     HbA1c, POC (controlled diabetic range) 5.7 0.0 - 7.0 %      Assessment & Plan:   Marland KitchenMarland KitchenFaith was seen today for follow-up and diabetes.  Diagnoses and all orders for this visit:  Type 2 diabetes mellitus with stage 3a chronic kidney disease, without long-term current use of insulin ([facility]) -     POCT glycosylated hemoglobin (Hb A1C)  Flu vaccine need -     Flu Vaccine QUAD 67moIM (Fluarix, Fluzone & Alfiuria Quad PF)  Essential hypertension, benign  Primary insomnia  Mixed hyperlipidemia  Bilateral foot pain   A1C looks great.  Continue on same medication.  BP to goal.  On statin.  Eye and foot exam UTD.  Flu shot given today.  Pneumonia/shingrix UTD.  Follow up in 3 months.   Insomnia-controlled.   Discussed foot pain. There is some plantar fasciitis Referral made to podiatry for orthotics.  Discussed voltaren gel Encouraged icing and exercises  Return in about 3 months (around 06/17/2022).

## 2022-03-18 NOTE — Patient Instructions (Signed)
Use foam roller Will make referral  Trial of diclofenac  Plantar Fasciitis Rehab Ask your health care provider which exercises are safe for you. Do exercises exactly as told by your health care provider and adjust them as directed. It is normal to feel mild stretching, pulling, tightness, or discomfort as you do these exercises. Stop right away if you feel sudden pain or your pain gets worse. Do not begin these exercises until told by your health care provider. Stretching and range-of-motion exercises These exercises warm up your muscles and joints and improve the movement and flexibility of your foot. These exercises also help to relieve pain. Plantar fascia stretch  Sit with your left / right leg crossed over your opposite knee. Hold your heel with one hand with that thumb near your arch. With your other hand, hold your toes and gently pull them back toward the top of your foot. You should feel a stretch on the base (bottom) of your toes, or the bottom of your foot (plantar fascia), or both. Hold this stretch for__________ seconds. Slowly release your toes and return to the starting position. Repeat __________ times. Complete this exercise __________ times a day. Gastrocnemius stretch, standing This exercise is also called a calf (gastroc) stretch. It stretches the muscles in the back of the upper calf. Stand with your hands against a wall. Extend your left / right leg behind you, and bend your front knee slightly. Keeping your heels on the floor, your toes facing forward, and your back knee straight, shift your weight toward the wall. Do not arch your back. You should feel a gentle stretch in your upper calf. Hold this position for __________ seconds. Repeat __________ times. Complete this exercise __________ times a day. Soleus stretch, standing This exercise is also called a calf (soleus) stretch. It stretches the muscles in the back of the lower calf. Stand with your hands against a  wall. Extend your left / right leg behind you, and bend your front knee slightly. Keeping your heels on the floor and your toes facing forward, bend your back knee and shift your weight slightly over your back leg. You should feel a gentle stretch deep in your lower calf. Hold this position for __________ seconds. Repeat __________ times. Complete this exercise __________ times a day. Gastroc and soleus stretch, standing step This exercise stretches the muscles in the back of the lower leg. These muscles are in the upper calf (gastrocnemius) and the lower calf (soleus). Stand with the ball of your left / right foot on the front of a step. The ball of your foot is on the walking surface, right under your toes. Keep your other foot firmly on the same step. Hold on to the wall or a railing for balance. Slowly lift your other foot, allowing your body weight to press your heel down over the edge of the front of the step. Keep knee straight and unbent. You should feel a stretch in your calf. Hold this position for __________ seconds. Return both feet to the step. Repeat this exercise with a slight bend in your left / right knee. Repeat __________ times with your left / right knee straight and __________ times with your left / right knee bent. Complete this exercise __________ times a day. Balance exercise This exercise builds your balance and strength control of your arch to help take pressure off your plantar fascia. Single leg stand If this exercise is too easy, you can try it with your eyes closed  or while standing on a pillow. Without shoes, stand near a railing or in a doorway. You may hold on to the railing or door frame as needed. Stand on your left / right foot. Keep your big toe down on the floor and lift the arch of your foot. You should feel a stretch across the bottom of your foot and your arch. Do not let your foot roll inward. Hold this position for __________ seconds. Repeat __________  times. Complete this exercise __________ times a day. This information is not intended to replace advice given to you by your health care provider. Make sure you discuss any questions you have with your health care provider. Document Revised: 03/28/2020 Document Reviewed: 03/28/2020 Elsevier Patient Education  [place].

## 2022-03-20 ENCOUNTER — Encounter: Payer: Self-pay | Admitting: Physician Assistant

## 2022-03-23 ENCOUNTER — Other Ambulatory Visit: Payer: Self-pay | Admitting: Physician Assistant

## 2022-03-23 DIAGNOSIS — E876 Hypokalemia: Secondary | ICD-10-CM

## 2022-03-27 ENCOUNTER — Ambulatory Visit: Payer: Medicare Other | Admitting: Podiatry

## 2022-04-02 DIAGNOSIS — H5213 Myopia, bilateral: Secondary | ICD-10-CM | POA: Diagnosis not present

## 2022-04-02 DIAGNOSIS — H2513 Age-related nuclear cataract, bilateral: Secondary | ICD-10-CM | POA: Diagnosis not present

## 2022-04-02 DIAGNOSIS — Z7984 Long term (current) use of oral hypoglycemic drugs: Secondary | ICD-10-CM | POA: Diagnosis not present

## 2022-04-02 DIAGNOSIS — E119 Type 2 diabetes mellitus without complications: Secondary | ICD-10-CM | POA: Diagnosis not present

## 2022-04-02 LAB — HM DIABETES EYE EXAM

## 2022-04-06 ENCOUNTER — Ambulatory Visit (INDEPENDENT_AMBULATORY_CARE_PROVIDER_SITE_OTHER): Payer: Medicare Other

## 2022-04-06 ENCOUNTER — Ambulatory Visit (INDEPENDENT_AMBULATORY_CARE_PROVIDER_SITE_OTHER): Payer: Medicare Other | Admitting: Podiatry

## 2022-04-06 DIAGNOSIS — M21611 Bunion of right foot: Secondary | ICD-10-CM

## 2022-04-06 DIAGNOSIS — M7742 Metatarsalgia, left foot: Secondary | ICD-10-CM

## 2022-04-06 DIAGNOSIS — M2011 Hallux valgus (acquired), right foot: Secondary | ICD-10-CM | POA: Diagnosis not present

## 2022-04-06 DIAGNOSIS — M7741 Metatarsalgia, right foot: Secondary | ICD-10-CM

## 2022-04-09 ENCOUNTER — Encounter: Payer: Self-pay | Admitting: Podiatry

## 2022-04-09 NOTE — Progress Notes (Signed)
  Subjective:  Patient ID: Bonnie Torres, female    DOB: 06-02-1956,  MRN: [id number]  Chief Complaint  Patient presents with   Diabetes    New patient - diabetic foot care, A1C  5.7   Gout    Istory of gout, last flare up 3 years ago    66 y.o. female presents with the above complaint. History confirmed with patient.  She has a history of bunion surgery on the right foot.  She has pain in the ball of both feet  Objective:  Physical Exam: warm, good capillary refill, no trophic changes or ulcerative lesions, normal DP and PT pulses, normal sensory exam, and pain on palpation to bilateral plantar forefoot especially on the second toe on the right foot.   Previous [place] osteotomy noted on her radiographs taken today multiple views of the right foot, there is elongation of the second metatarsal compared to the remaining metatarsals Assessment:   1. Metatarsalgia of both feet      Plan:  Patient was evaluated and treated and all questions answered.  Discussed the radiographic and clinical exam findings and how she has likely metatarsalgia.  Discussed treating this with offloading and custom molded orthoses.  She was casted for these today.  We discussed surgical treatment of this as well if it did not improve.  She will return as needed once the orthotics are completed for adjustments  Return if symptoms worsen or fail to improve.

## 2022-04-15 ENCOUNTER — Encounter: Payer: Self-pay | Admitting: Emergency Medicine

## 2022-04-15 ENCOUNTER — Ambulatory Visit
Admission: EM | Admit: 2022-04-15 | Discharge: 2022-04-15 | Disposition: A | Discharge status: Home / Self Care | Payer: Medicare Other | Attending: Family Medicine | Admitting: Family Medicine

## 2022-04-15 DIAGNOSIS — M79671 Pain in right foot: Secondary | ICD-10-CM

## 2022-04-15 DIAGNOSIS — M7741 Metatarsalgia, right foot: Secondary | ICD-10-CM

## 2022-04-15 DIAGNOSIS — M7742 Metatarsalgia, left foot: Secondary | ICD-10-CM

## 2022-04-15 MED ORDER — PREDNISONE 20 MG PO TABS: 40.0000 mg | ORAL_TABLET | Freq: Every day | ORAL | 0 refills | Status: DC

## 2022-04-15 NOTE — Discharge Instructions (Signed)
You need to stay off your feet for couple days Use ice for 20 minutes every few hours Take prednisone once a day for 5 days.  Be careful with your carbohydrates and diet while on prednisone, it can increase your sugar See your foot doctor in follow-up

## 2022-04-15 NOTE — ED Triage Notes (Signed)
Pt states in the middle of the night Sunday she had right foot pain that was intermittent but states pain is constant now, pain is only on bottom of foot. Denies known injury. States she took motrin with some relief.

## 2022-04-15 NOTE — ED Provider Notes (Signed)
[name]    CSN: [id number] Arrival date & time: 04/15/22  1119      History   Chief Complaint Chief Complaint  Patient presents with   Foot Pain    HPI Bonnie Torres is a 66 y.o. female.   HPI  Patient is under the care of podiatry for bilateral metatarsalgia.  She has been fitted for new shoe inserts but has not yet received them.  She was just seen 2 weeks ago.  She is here because she has increased pain in her right foot.  She states that it started hurting Sunday so she rested for that day.  It felt a bit better and she tried to work Monday and Tuesday.  Yesterday she states there were tears in her eyes at the end of her shift because she could hardly put weight on her foot.  It remains painful today.  No trauma.  She does have a history of gout but this does not feel like gout.  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   Type 2 diabetes mellitus without complication, without long-term current use of insulin ([place]) 12/03/2017    Patient Active Problem List   Diagnosis Date Noted   Idiopathic chronic gout of right knee without tophus 12/16/2021   Bilateral primary osteoarthritis of knee 08/27/2021   Acute pain of right knee 08/26/2021   DDD (degenerative disc disease), lumbar 11/12/2020   Stage 3b chronic kidney disease ([place]) 03/12/2020   Bilateral plantar fasciitis 01/29/2020   neoplasm left kidney 01/29/2020   Hx of partial nephrectomy 01/29/2020   Neoplasm of left kidney 11/30/2019   Cubital tunnel syndrome, left 11/20/2019   Personal history of gout 01/25/2019   Acute gout of right foot 01/25/2019   Elevated uric acid in blood 01/25/2019   Mid back pain on left side 07/29/2018   Irregular heart rate 04/06/2018   Hypotension  04/06/2018   Elevated serum creatinine 12/07/2017   Type 2 diabetes mellitus without complication, without long-term current use of insulin ([place]) 12/03/2017   Class 1 obesity due to excess calories without serious comorbidity with body mass index (BMI) of 33.0 to 33.9 in adult 12/03/2017   Trigger middle finger of left hand 12/01/2017   Anterior cervical adenopathy 05/19/2017   Hypokalemia 04/02/2017   Obesity (BMI 30.0-34.9) 09/16/2016   Hot flashes due to surgical menopause 08/19/2016   Lymphangioma 06/18/2016   Uterine mass 04/27/2016   Mass of kidney 04/05/2016   Elevated testosterone level in female 03/25/2016   Breast calcification, right 11/13/2015   Primary gout 10/16/2015   Bilateral low back pain without sciatica 04/17/2015   Hyperlipidemia 09/28/2014   Insomnia 08/31/2014   Essential hypertension, benign 08/31/2014    Past Surgical History:  Procedure Laterality Date   ABDOMINAL HYSTERECTOMY N/A 06/18/2016   Procedure: HYSTERECTOMY ABDOMINAL TOTAL;  Surgeon: Everitt Amber, MD;  Location: [facility];  Service: Gynecology;  Laterality: N/A;   BREAST EXCISIONAL BIOPSY Right    BREAST SURGERY Right    breast biopsy-benign   CHOLECYSTECTOMY     MASS EXCISION N/A 06/18/2016   Procedure: RESECTION RETROPERITONEAL MASS WITH RETORPERITONEAL EXPLORATION ,LIGATION CHYLI CISTERNA,OPEN  CHOLECYSTECTOMY WITH MOBILATION HEPATIC FLEXURE;  Surgeon:  Hall Busing, MD;  Location: [facility];  Service: General;  Laterality: N/A;   PARTIAL NEPHRECTOMY Right 06/18/2016   Procedure: OPEN RIGHT NEPHRECTOMY PARTIAL;  Surgeon: Raynelle Bring, MD;  Location: [facility];  Service: Urology;  Laterality: Right;   ROBOTIC ASSITED PARTIAL NEPHRECTOMY Left 11/30/2019   Procedure: ATTEMPTED XI ROBOTIC ASSITED LAPAROSCOPIC CONVERTED TO OPEN PARTIAL NEPHRECTOMY;  Surgeon: Raynelle Bring, MD;  Location: [facility];  Service: Urology;  Laterality: Left;   SALPINGOOPHORECTOMY Bilateral 06/18/2016   Procedure: BILATERAL SALPINGO  OOPHORECTOMY;  Surgeon: Everitt Amber, MD;  Location: [facility];  Service: Gynecology;  Laterality: Bilateral;   SHOULDER SURGERY Right 2010   TOE SURGERY Right Hammer Toe   2004   TONSILLECTOMY      OB History   No obstetric history on file.      Home Medications    Prior to Admission medications   Medication Sig Start Date End Date Taking? Authorizing Provider  predniSONE (DELTASONE) 20 MG tablet Take 2 tablets (40 mg total) by mouth daily with breakfast. 04/15/22  Yes Raylene Everts, MD  atorvastatin (LIPITOR) 80 MG tablet TAKE 1 TABLET BY MOUTH DAILY 02/17/22   Breeback, Jade L, PA-C  Blood Glucose Monitoring Suppl ([place] FLEX SYSTEM) w/Device KIT Check fasting blood sugar every morning. DM ICD10 E11.9 01/14/18   Iran Planas L, PA-C  clindamycin (CLINDAGEL) 1 % gel Apply topically. 12/22/21   [provider]  diclofenac Sodium (VOLTAREN) 1 % GEL APPLY 4 GRAMS TOPICALLY 4 TIMES  DAILY TO LOWER EXTREMITY JOINT.  MANUFACTURER RECOMMENDS NOT  EXCEEDING 32 GRAMS/DAY 03/10/22   Breeback, Jade L, PA-C  hydroquinone 4 % cream Apply topically 2 (two) times daily. 07/12/21   [provider]  Lancets (ONETOUCH DELICA PLUS [id number]) [place] 12/18/21   Donella Stade, PA-C  metFORMIN (GLUCOPHAGE-XR) 750 MG 24 hr tablet Take 1 tablet (750 mg total) by mouth daily with breakfast. 12/16/21   Breeback, Jade L, PA-C  metoprolol succinate (TOPROL-XL) 100 MG 24 hr tablet Take 1 tablet (100 mg total) by mouth daily. 12/16/21   Breeback, Jade L, PA-C  Olmesartan-amLODIPine-HCTZ 20-5-12.5 MG TABS Take 1 tablet by mouth daily. 12/16/21   Breeback, Royetta Car, PA-C  ONETOUCH VERIO test strip CHECK BLOOD GLUCOSE TWICE  DAILY AS DIRECTED AS NEEDED 07/07/21   Breeback, Jade L, PA-C  potassium chloride SA (KLOR-CON M) 20 MEQ tablet TAKE 1 TABLET BY MOUTH DAILY 03/23/22   Breeback, Jade L, PA-C  tacrolimus (PROTOPIC) 0.1 % ointment SMARTSIG:1 Topical Daily PRN 03/26/21    [provider]  tretinoin (RETIN-A) 0.05 % cream Apply 1 application topically at bedtime.     [provider]  triamcinolone cream (KENALOG) 0.1 % Apply 1 Application topically 2 (two) times daily. 01/13/22   Breeback, Jade L, PA-C  zolpidem (AMBIEN) 10 MG tablet Take 1 tablet (10 mg total) by mouth at bedtime. 12/16/21   Donella Stade, PA-C    Family History Family History  Problem Relation Age of Onset   Heart attack Paternal Aunt    Hypertension Mother    Diabetes Maternal Aunt    Cancer Paternal Aunt        stomach   Cancer Sister    Breast cancer Other     Social History Social History   Tobacco Use   Smoking status: Never   Smokeless tobacco: Never  Vaping Use   Vaping Use: Never used  Substance Use Topics  Alcohol use: No    Alcohol/week: 0.0 standard drinks of alcohol   Drug use: No     Allergies   Patient has no known allergies.   Review of Systems Review of Systems See HPI  Physical Exam Triage Vital Signs ED Triage Vitals  Enc Vitals Group     BP 04/15/22 1143 129/81     Pulse Rate 04/15/22 1143 79     Resp 04/15/22 1143 16     Temp 04/15/22 1143 98.2 F (36.8 C)     Temp Source 04/15/22 1143 Oral     SpO2 04/15/22 1143 99 %     Weight --      Height --      Head Circumference --      Peak Flow --      Pain Score 04/15/22 1144 7     Pain Loc --      Pain Edu? --      Excl. in [place]? --    No data found.  Updated Vital Signs BP 129/81 (BP Location: Right Arm)   Pulse 79   Temp 98.2 F (36.8 C) (Oral)   Resp 16   SpO2 99%      Physical Exam Constitutional:      General: She is not in acute distress.    Appearance: She is well-developed and normal weight.  HENT:     Head: Normocephalic and atraumatic.  Eyes:     Conjunctiva/sclera: Conjunctivae normal.     Pupils: Pupils are equal, round, and reactive to light.  Cardiovascular:     Rate and Rhythm: Normal rate.  Pulmonary:     Effort: Pulmonary effort is  normal. No respiratory distress.  Abdominal:     General: There is no distension.     Palpations: Abdomen is soft.  Musculoskeletal:        General: Normal range of motion.     Cervical back: Normal range of motion.     Comments: Patient has mild pes planus bilaterally.  There is tenderness to squeeze pressure across the metatarsals in the mid and distal foot.  No pain with movement of toes.  There is slight pain with dorsiflexion of ankle..  No tenderness of the heel or plantar fascia insertion.  Skin:    General: Skin is warm and dry.  Neurological:     Mental Status: She is alert.     Gait: Gait abnormal.  Psychiatric:        Mood and Affect: Mood normal.        Behavior: Behavior normal.      UC Treatments / Results  Labs (all labs ordered are listed, but only abnormal results are displayed) Labs Reviewed - No data to display  EKG   Radiology No results found.  Procedures Procedures (including critical care time)  Medications Ordered in UC Medications - No data to display  Initial Impression / Assessment and Plan / UC Course  I have reviewed the triage vital signs and the nursing notes.  Pertinent labs & imaging results that were available during my care of the patient were reviewed by me and considered in my medical decision making (see chart for details).     Final Clinical Impressions(s) / UC Diagnoses   Final diagnoses:  Foot pain, right     Discharge Instructions      You need to stay off your feet for couple days Use ice for 20 minutes every few hours Take prednisone once a  day for 5 days.  Be careful with your carbohydrates and diet while on prednisone, it can increase your sugar See your foot doctor in follow-up   ED Prescriptions     Medication Sig Dispense Auth. Provider   predniSONE (DELTASONE) 20 MG tablet Take 2 tablets (40 mg total) by mouth daily with breakfast. 10 tablet Raylene Everts, MD      PDMP not reviewed this  encounter.   Raylene Everts, MD 04/15/22 [phone]

## 2022-04-16 ENCOUNTER — Telehealth: Payer: Self-pay | Admitting: Emergency Medicine

## 2022-04-16 DIAGNOSIS — H5203 Hypermetropia, bilateral: Secondary | ICD-10-CM | POA: Diagnosis not present

## 2022-04-16 NOTE — Telephone Encounter (Signed)
Call to see how Bonnie Torres was doing today, no answer, message left with call back # for any questions or concerns

## 2022-04-28 DIAGNOSIS — L700 Acne vulgaris: Secondary | ICD-10-CM | POA: Diagnosis not present

## 2022-04-28 DIAGNOSIS — Z79899 Other long term (current) drug therapy: Secondary | ICD-10-CM | POA: Diagnosis not present

## 2022-05-13 ENCOUNTER — Ambulatory Visit (INDEPENDENT_AMBULATORY_CARE_PROVIDER_SITE_OTHER): Payer: Medicare Other

## 2022-05-13 DIAGNOSIS — M7741 Metatarsalgia, right foot: Secondary | ICD-10-CM | POA: Diagnosis not present

## 2022-05-13 DIAGNOSIS — M7742 Metatarsalgia, left foot: Secondary | ICD-10-CM | POA: Diagnosis not present

## 2022-05-13 NOTE — Progress Notes (Signed)
Patient presents today to pick up custom molded foot orthotics, diagnosed with metatarsalgia of both feet by Dr. Sherryle Lis.   Orthotics were dispensed and fit was satisfactory. Reviewed instructions for break-in and wear. Written instructions given to patient.  Patient will follow up as needed.   [facility] - order # [id number]

## 2022-06-03 ENCOUNTER — Encounter: Payer: Self-pay | Admitting: Physician Assistant

## 2022-06-05 ENCOUNTER — Other Ambulatory Visit: Payer: Self-pay | Admitting: Physician Assistant

## 2022-06-05 DIAGNOSIS — F5101 Primary insomnia: Secondary | ICD-10-CM

## 2022-06-05 NOTE — Telephone Encounter (Signed)
Last appt 03/18/2022  Last written 12/16/2021 #90 with no refills

## 2022-06-16 ENCOUNTER — Encounter: Payer: Self-pay | Admitting: Physician Assistant

## 2022-06-16 ENCOUNTER — Ambulatory Visit (INDEPENDENT_AMBULATORY_CARE_PROVIDER_SITE_OTHER): Payer: Medicare Other | Admitting: Physician Assistant

## 2022-06-16 VITALS — BP 130/77 | HR 77 | Ht <= 58 in | Wt 160.0 lb

## 2022-06-16 DIAGNOSIS — F5101 Primary insomnia: Secondary | ICD-10-CM | POA: Diagnosis not present

## 2022-06-16 DIAGNOSIS — E1122 Type 2 diabetes mellitus with diabetic chronic kidney disease: Secondary | ICD-10-CM

## 2022-06-16 DIAGNOSIS — Z23 Encounter for immunization: Secondary | ICD-10-CM | POA: Diagnosis not present

## 2022-06-16 DIAGNOSIS — E876 Hypokalemia: Secondary | ICD-10-CM

## 2022-06-16 DIAGNOSIS — I10 Essential (primary) hypertension: Secondary | ICD-10-CM | POA: Diagnosis not present

## 2022-06-16 DIAGNOSIS — N1831 Chronic kidney disease, stage 3a: Secondary | ICD-10-CM | POA: Diagnosis not present

## 2022-06-16 DIAGNOSIS — E785 Hyperlipidemia, unspecified: Secondary | ICD-10-CM | POA: Diagnosis not present

## 2022-06-16 LAB — POCT GLYCOSYLATED HEMOGLOBIN (HGB A1C): Hemoglobin A1C: 7 % — AB (ref 4.0–5.6)

## 2022-06-16 MED ORDER — SPIRONOLACTONE 25 MG PO TABS: 25.0000 mg | ORAL_TABLET | Freq: Two times a day (BID) | ORAL | 3 refills | Status: DC

## 2022-06-16 MED ORDER — POTASSIUM CHLORIDE CRYS ER 20 MEQ PO TBCR: 20.0000 meq | EXTENDED_RELEASE_TABLET | Freq: Every day | ORAL | 2 refills | Status: DC

## 2022-06-16 MED ORDER — ATORVASTATIN CALCIUM 80 MG PO TABS: 80.0000 mg | ORAL_TABLET | Freq: Every day | ORAL | 2 refills | Status: DC

## 2022-06-16 MED ORDER — BLOOD GLUCOSE MONITOR KIT: PACK | 0 refills | Status: DC

## 2022-06-16 NOTE — Patient Instructions (Signed)
Meter

## 2022-06-16 NOTE — Progress Notes (Unsigned)
Established Patient Office Visit  Subjective   Patient ID: Bonnie Torres, female    DOB: [birth year]  Age: 66 y.o. MRN: [id number]  Chief Complaint  Patient presents with   Follow-up    HPI Pt is a 66 yo obese female with T2DM, HTN, HLD, CKD 3, hx of renal cell carcinoma who presents to the clinic for 3 month follow up.   Pt is doing well today. She is not checking her sugars because her meter is not working and needs another one. She is only taking metformin. She denies any CP, palpitations, headaches or vision changes.   Insomnia fairly well controlled with ambien. It does not always keep her asleep all night but she does not want to try any new medications at this time.   Follows up with nephrology every 6 months.    .. Active Ambulatory Problems    Diagnosis Date Noted   Insomnia 08/31/2014   Essential hypertension, benign 08/31/2014   Hyperlipidemia 09/28/2014   Bilateral low back pain without sciatica 04/17/2015   Primary gout 10/16/2015   Breast calcification, right 11/13/2015   Elevated testosterone level in female 03/25/2016   Mass of kidney 04/05/2016   Uterine mass 04/27/2016   Lymphangioma 06/18/2016   Hot flashes due to surgical menopause 08/19/2016   Obesity (BMI 30.0-34.9) 09/16/2016   Hypokalemia 04/02/2017   Anterior cervical adenopathy 05/19/2017   Trigger middle finger of left hand 12/01/2017   Type 2 diabetes mellitus with stage 3a chronic kidney disease, without long-term current use of insulin ([place]) 12/03/2017   Class 1 obesity due to excess calories without serious comorbidity with body mass index (BMI) of 33.0 to 33.9 in adult 12/03/2017   Elevated serum creatinine 12/07/2017   Irregular heart rate 04/06/2018   Hypotension 04/06/2018   Mid back pain on left side 07/29/2018   Personal history of gout 01/25/2019   Acute gout of right foot 01/25/2019   Elevated uric acid in blood 01/25/2019   Cubital tunnel syndrome, left 11/20/2019   Neoplasm  of left kidney 11/30/2019   Bilateral plantar fasciitis 01/29/2020   neoplasm left kidney 01/29/2020   Hx of partial nephrectomy 01/29/2020   Stage 3b chronic kidney disease ([place]) 03/12/2020   DDD (degenerative disc disease), lumbar 11/12/2020   Acute pain of right knee 08/26/2021   Bilateral primary osteoarthritis of knee 08/27/2021   Idiopathic chronic gout of right knee without tophus 12/16/2021   Resolved Ambulatory Problems    Diagnosis Date Noted   Mass of omentum 04/05/2016   Hirsutism 04/05/2016   Mass of ovary 04/05/2016   Renal cell carcinoma ([place]) 08/18/2016   Past Medical History:  Diagnosis Date   Complication of anesthesia    Gout    Heart rate fast    Hypertension    PONV (postoperative nausea and vomiting)    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     Objective:     BP 130/77   Pulse 77   Ht _0  (1.473 m)   Wt 160 lb (72.6 kg)   SpO2 95%   BMI 33.44 kg/m  BP Readings from Last 3 Encounters:  06/16/22 130/77  04/15/22 129/81  03/18/22 103/64   Wt Readings from Last 3 Encounters:  06/16/22 160 lb (72.6 kg)  03/18/22 160 lb (72.6 kg)  01/13/22 158 lb (71.7 kg)    .[name] Results for orders placed or performed in visit on 06/16/22  POCT glycosylated hemoglobin (Hb A1C)  Result Value Ref Range   Hemoglobin A1C 7.0 (A) 4.0 - 5.6 %   HbA1c POC (<> result, manual entry)     HbA1c, POC (prediabetic range)     HbA1c, POC (controlled diabetic range)       Physical Exam Vitals reviewed.  Constitutional:      Appearance: Normal appearance. She is obese.  HENT:     Head: Normocephalic.  Cardiovascular:     Rate and Rhythm: Normal rate and regular rhythm.     Pulses: Normal pulses.  Pulmonary:     Effort: Pulmonary effort is normal.  Musculoskeletal:     Right lower  leg: No edema.     Left lower leg: No edema.  Neurological:     General: No focal deficit present.     Mental Status: She is alert.  Psychiatric:        Mood and Affect: Mood normal.          Assessment & Plan:  Marland KitchenMarland KitchenFaith was seen today for follow-up.  Diagnoses and all orders for this visit:  Need for COVID-19 vaccine [employer] [place] Fall 2023 Covid-19 Vaccine 11yr and older  Type 2 diabetes mellitus with stage 3a chronic kidney disease, without long-term current use of insulin (HCC) -     POCT glycosylated hemoglobin (Hb A1C) -     blood glucose meter kit and supplies KIT; Dispense based on patient and insurance preference. Use up to four times daily as directed. Please include lancets, test strips, control solution.Request One Tough Verio Flex System  Hypokalemia -     potassium chloride SA (KLOR-CON M) 20 MEQ tablet; Take 1 tablet (20 mEq total) by mouth daily. -     spironolactone (ALDACTONE) 25 MG tablet; Take 1 tablet (25 mg total) by mouth 2 (two) times daily.  Primary insomnia  Essential hypertension, benign -     spironolactone (ALDACTONE) 25 MG tablet; Take 1 tablet (25 mg total) by mouth 2 (two) times daily.  Hyperlipidemia LDL goal <70 -     atorvastatin (LIPITOR) 80 MG tablet; Take 1 tablet (80 mg total) by mouth daily.    A1C just at goal Discussed next visit to add SGLT-2 for better glucose control Encouraged low sugar and carb diet New glucometer sent to pharmacy to start checking sugars more BP to goal On statin Foot and eye exam UTD Flu/pneumonia/covid vaccine UTD Follow up in 3 months.   Continue ambien for insomnia  Refilled potassium Labs UTD.   Bonnie Planas PA-C

## 2022-06-17 ENCOUNTER — Encounter: Payer: Self-pay | Admitting: Physician Assistant

## 2022-06-24 ENCOUNTER — Other Ambulatory Visit: Payer: Self-pay | Admitting: Physician Assistant

## 2022-06-24 DIAGNOSIS — N1831 Chronic kidney disease, stage 3a: Secondary | ICD-10-CM

## 2022-07-31 ENCOUNTER — Ambulatory Visit (INDEPENDENT_AMBULATORY_CARE_PROVIDER_SITE_OTHER): Payer: 59 | Admitting: Family Medicine

## 2022-07-31 VITALS — BP 131/83 | HR 70 | Ht 59.0 in | Wt 159.1 lb

## 2022-07-31 DIAGNOSIS — Z0000 Encounter for general adult medical examination without abnormal findings: Secondary | ICD-10-CM

## 2022-07-31 NOTE — Patient Instructions (Addendum)
[facility] Maintenance Summary and Written Plan of Care  Bonnie Torres ,  Thank you for allowing me to perform your Medicare Annual Wellness Visit and for your ongoing commitment to your health.   Health Maintenance & Immunization History Health Maintenance  Topic Date Due   Diabetic kidney evaluation - eGFR measurement  08/26/2022 (Originally 08/25/2022)   COVID-19 Vaccine (6 - 2023-24 season) 08/11/2022   FOOT EXAM  11/21/2022   HEMOGLOBIN A1C  12/16/2022   Diabetic kidney evaluation - Urine ACR  03/19/2023   OPHTHALMOLOGY EXAM  04/03/2023   Fecal DNA (Cologuard)  05/28/2023   Medicare Annual Wellness (AWV)  08/01/2023   MAMMOGRAM  09/04/2023   DTaP/Tdap/Td (3 - Td or Tdap) 10/11/2024   INFLUENZA VACCINE  Completed   Hepatitis C Screening  Completed   HIV Screening  Completed   Zoster Vaccines- Shingrix  Completed   HPV VACCINES  Aged Out   Immunization History  Administered Date(s) Administered   COVID-19, mRNA, vaccine(Comirnaty)12 years and older 06/16/2022   Influenza,inj,Quad PF,6+ Mos 04/15/2015, 03/23/2016, 03/31/2017, 03/07/2018, 04/05/2019, 04/18/2020, 03/18/2022   Influenza-Unspecified 04/28/2011, 05/29/2013, 04/27/2021   PFIZER(Purple Top)SARS-COV-2 Vaccination 09/28/2019, 10/26/2019, 06/23/2020, 04/02/2021   PPD Test 02/16/2018   Pneumococcal Polysaccharide-23 12/01/2017   Tdap 12/09/2010, 10/12/2014   Zoster Recombinat (Shingrix) 12/26/2018, 04/27/2021    These are the patient goals that we discussed:  Goals Addressed               This Visit's Progress     Patient Stated (pt-stated)        07/31/2022 AWV Goal: Exercise for General Health  Patient will verbalize understanding of the benefits of increased physical activity: Exercising regularly is important. It will improve your overall fitness, flexibility, and endurance. Regular exercise also will improve your overall health. It can help you control your weight, reduce  stress, and improve your bone density. Over the next year, patient will increase physical activity as tolerated with a goal of at least 150 minutes of moderate physical activity per week.  You can tell that you are exercising at a moderate intensity if your heart starts beating faster and you start breathing faster but can still hold a conversation. Moderate-intensity exercise ideas include: Walking 1 mile (1.6 km) in about 15 minutes Biking Hiking Golfing Dancing Water aerobics Patient will verbalize understanding of everyday activities that increase physical activity by providing examples like the following: Yard work, such as: Sales promotion account executive Gardening Washing windows or floors Patient will be able to explain general safety guidelines for exercising:  Before you start a new exercise program, talk with your health care provider. Do not exercise so much that you hurt yourself, feel dizzy, or get very short of breath. Wear comfortable clothes and wear shoes with good support. Drink plenty of water while you exercise to prevent dehydration or heat stroke. Work out until your breathing and your heartbeat get faster.          This is a list of Health Maintenance Items that are overdue or due now: Screening mammography- due in march    Orders/Referrals Placed Today: No orders of the defined types were placed in this encounter.  (Contact our referral department at [phone] if you have not spoken with someone about your referral appointment within the next 5 days)    Follow-up Plan Follow-up with Donella Stade, PA-C as planned Schedule mammogram in  March.  Medicare wellness visit in one year. AVS Printed and given to the patient.     Health Maintenance, Female Adopting a healthy lifestyle and getting preventive care are important in promoting health and wellness. Ask your health care  provider about: The right schedule for you to have regular tests and exams. Things you can do on your own to prevent diseases and keep yourself healthy. What should I know about diet, weight, and exercise? Eat a healthy diet  Eat a diet that includes plenty of vegetables, fruits, low-fat dairy products, and lean protein. Do not eat a lot of foods that are high in solid fats, added sugars, or sodium. Maintain a healthy weight Body mass index (BMI) is used to identify weight problems. It estimates body fat based on height and weight. Your health care provider can help determine your BMI and help you achieve or maintain a healthy weight. Get regular exercise Get regular exercise. This is one of the most important things you can do for your health. Most adults should: Exercise for at least 150 minutes each week. The exercise should increase your heart rate and make you sweat (moderate-intensity exercise). Do strengthening exercises at least twice a week. This is in addition to the moderate-intensity exercise. Spend less time sitting. Even light physical activity can be beneficial. Watch cholesterol and blood lipids Have your blood tested for lipids and cholesterol at 65 years of age, then have this test every 5 years. Have your cholesterol levels checked more often if: Your lipid or cholesterol levels are high. You are older than 65 years of age. You are at high risk for heart disease. What should I know about cancer screening? Depending on your health history and family history, you may need to have cancer screening at various ages. This may include screening for: Breast cancer. Cervical cancer. Colorectal cancer. Skin cancer. Lung cancer. What should I know about heart disease, diabetes, and high blood pressure? Blood pressure and heart disease High blood pressure causes heart disease and increases the risk of stroke. This is more likely to develop in people who have high blood pressure  readings or are overweight. Have your blood pressure checked: Every 3-5 years if you are 58-52 years of age. Every year if you are 32 years old or older. Diabetes Have regular diabetes screenings. This checks your fasting blood sugar level. Have the screening done: Once every three years after age 19 if you are at a normal weight and have a low risk for diabetes. More often and at a younger age if you are overweight or have a high risk for diabetes. What should I know about preventing infection? Hepatitis B If you have a higher risk for hepatitis B, you should be screened for this virus. Talk with your health care provider to find out if you are at risk for hepatitis B infection. Hepatitis C Testing is recommended for: Everyone born from [birth year] through 1963. Anyone with known risk factors for hepatitis C. Sexually transmitted infections (STIs) Get screened for STIs, including gonorrhea and chlamydia, if: You are sexually active and are younger than 65 years of age. You are older than 65 years of age and your health care provider tells you that you are at risk for this type of infection. Your sexual activity has changed since you were last screened, and you are at increased risk for chlamydia or gonorrhea. Ask your health care provider if you are at risk. Ask your health care provider about whether  you are at high risk for HIV. Your health care provider may recommend a prescription medicine to help prevent HIV infection. If you choose to take medicine to prevent HIV, you should first get tested for HIV. You should then be tested every 3 months for as long as you are taking the medicine. Pregnancy If you are about to stop having your period (premenopausal) and you may become pregnant, seek counseling before you get pregnant. Take 400 to 800 micrograms (mcg) of folic acid every day if you become pregnant. Ask for birth control (contraception) if you want to prevent pregnancy. Osteoporosis and  menopause Osteoporosis is a disease in which the bones lose minerals and strength with aging. This can result in bone fractures. If you are 19 years old or older, or if you are at risk for osteoporosis and fractures, ask your health care provider if you should: Be screened for bone loss. Take a calcium or vitamin D supplement to lower your risk of fractures. Be given hormone replacement therapy (HRT) to treat symptoms of menopause. Follow these instructions at home: Alcohol use Do not drink alcohol if: Your health care provider tells you not to drink. You are pregnant, may be pregnant, or are planning to become pregnant. If you drink alcohol: Limit how much you have to: 0-1 drink a day. Know how much alcohol is in your drink. In the [place] one drink equals one 12 oz bottle of beer (355 mL), one 5 oz glass of wine (148 mL), or one 1 oz glass of hard liquor (44 mL). Lifestyle Do not use any products that contain nicotine or tobacco. These products include cigarettes, chewing tobacco, and vaping devices, such as e-cigarettes. If you need help quitting, ask your health care provider. Do not use street drugs. Do not share needles. Ask your health care provider for help if you need support or information about quitting drugs. General instructions Schedule regular health, dental, and eye exams. Stay current with your vaccines. Tell your health care provider if: You often feel depressed. You have ever been abused or do not feel safe at home. Summary Adopting a healthy lifestyle and getting preventive care are important in promoting health and wellness. Follow your health care provider's instructions about healthy diet, exercising, and getting tested or screened for diseases. Follow your health care provider's instructions on monitoring your cholesterol and blood pressure. This information is not intended to replace advice given to you by your health care provider. Make sure you discuss any  questions you have with your health care provider. Document Revised: 11/04/2020 Document Reviewed: 11/04/2020 [employer] Patient Education  [employer].

## 2022-07-31 NOTE — Progress Notes (Signed)
MEDICARE ANNUAL WELLNESS VISIT  07/31/2022  Subjective:  Bonnie Torres is a 67 y.o. female patient of Alden Hipp, Royetta Car, PA-C who had a [employer] Visit today. Bonnie Torres is Working part time and lives with their daughter. she has 1 child. she reports that she is socially active and does interact with friends/family regularly. she is minimally physically active and enjoys spending time with her daughter and her mom.  Patient Care Team: Lavada Mesi as PCP - General (Family Medicine)     07/31/2022    3:04 PM 07/21/2021    3:03 PM 04/18/2020    2:41 PM 11/30/2019    4:34 PM 11/20/2019   11:08 AM 04/25/2019    3:54 PM 04/04/2018    4:09 PM  Advanced Directives  Does Patient Have a Medical Advance Directive? No No No No No No No  Would patient like information on creating a medical advance directive? No - Patient declined No - Patient declined Yes (MAU/Ambulatory/Procedural Areas - Information given) No - Patient declined No - Patient declined No - Patient declined Yes (MAU/Ambulatory/Procedural Areas - Information given)    Hospital Utilization Over the Past 12 Months: # of hospitalizations or ER visits: 0 # of surgeries: 0  Review of Systems    Patient reports that her overall health is unchanged when compared to last year.  Review of Systems: History obtained from chart review and the patient  All other systems negative.  Pain Assessment Pain : No/denies pain     Current Medications & Allergies (verified) Allergies as of 07/31/2022   No Known Allergies      Medication List        Accurate as of July 31, 2022  3:18 PM. If you have any questions, ask your nurse or doctor.          atorvastatin 80 MG tablet Commonly known as: LIPITOR Take 1 tablet (80 mg total) by mouth daily.   blood glucose meter kit and supplies Kit Dispense based on patient and insurance preference. Use up to four times daily as directed. Please include lancets, test  strips, control solution.Request One Tough Verio Flex System   clindamycin 1 % gel Commonly known as: CLINDAGEL Apply topically.   diclofenac Sodium 1 % Gel Commonly known as: VOLTAREN APPLY 4 GRAMS TOPICALLY 4 TIMES  DAILY TO LOWER EXTREMITY JOINT.  MANUFACTURER RECOMMENDS NOT  EXCEEDING 32 GRAMS/DAY   hydroquinone 4 % cream Apply topically 2 (two) times daily.   metFORMIN 750 MG 24 hr tablet Commonly known as: GLUCOPHAGE-XR Take 1 tablet (750 mg total) by mouth daily with breakfast.   metoprolol succinate 100 MG 24 hr tablet Commonly known as: TOPROL-XL Take 1 tablet (100 mg total) by mouth daily.   minocycline 50 MG tablet Commonly known as: DYNACIN Take 50 mg by mouth 2 (two) times daily.   Olmesartan-amLODIPine-HCTZ 20-5-12.5 MG Tabs Take 1 tablet by mouth daily.   OneTouch Delica Plus [id number] Misc CHECK BLOOD SUGAR TWICE  DAILY   OneTouch Verio Flex System w/Device Kit Check fasting blood sugar every morning. DM ICD10 E11.9   OneTouch Verio test strip Generic drug: glucose blood CHECK BLOOD GLUCOSE TWICE DAILY  AS DIRECTED AS NEEDED   potassium chloride SA 20 MEQ tablet Commonly known as: KLOR-CON M Take 1 tablet (20 mEq total) by mouth daily.   spironolactone 25 MG tablet Commonly known as: ALDACTONE Take 1 tablet (25 mg total) by mouth 2 (two) times daily.   tacrolimus  0.1 % ointment Commonly known as: PROTOPIC SMARTSIG:1 Topical Daily PRN   tretinoin 0.05 % cream Commonly known as: RETIN-A Apply 1 application topically at bedtime.   triamcinolone cream 0.1 % Commonly known as: KENALOG Apply 1 Application topically 2 (two) times daily.   zolpidem 10 MG tablet Commonly known as: AMBIEN Take 0.5-1 tablets (5-10 mg total) by mouth at bedtime.        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   Type 2 diabetes mellitus without complication, without long-term current use of insulin ([place]) 12/03/2017   Past Surgical History:  Procedure Laterality Date   ABDOMINAL HYSTERECTOMY N/A 06/18/2016   Procedure: HYSTERECTOMY ABDOMINAL TOTAL;  Surgeon: Everitt Amber, MD;  Location: [facility];  Service: Gynecology;  Laterality: N/A;   BREAST EXCISIONAL BIOPSY Right    BREAST SURGERY Right    breast biopsy-benign   CHOLECYSTECTOMY     MASS EXCISION N/A 06/18/2016   Procedure: RESECTION RETROPERITONEAL MASS WITH RETORPERITONEAL EXPLORATION ,LIGATION CHYLI CISTERNA,OPEN  CHOLECYSTECTOMY WITH MOBILATION HEPATIC FLEXURE;  Surgeon: Hall Busing, MD;  Location: [facility];  Service: General;  Laterality: N/A;   PARTIAL NEPHRECTOMY Right 06/18/2016   Procedure: OPEN RIGHT NEPHRECTOMY PARTIAL;  Surgeon: Raynelle Bring, MD;  Location: [facility];  Service: Urology;  Laterality: Right;   ROBOTIC ASSITED PARTIAL NEPHRECTOMY Left 11/30/2019   Procedure: ATTEMPTED XI ROBOTIC ASSITED LAPAROSCOPIC CONVERTED TO OPEN PARTIAL NEPHRECTOMY;  Surgeon: Raynelle Bring, MD;  Location: [facility];  Service: Urology;  Laterality: Left;   SALPINGOOPHORECTOMY Bilateral 06/18/2016   Procedure: BILATERAL SALPINGO OOPHORECTOMY;  Surgeon: Everitt Amber, MD;  Location: [facility];  Service: Gynecology;  Laterality: Bilateral;   SHOULDER SURGERY Right 2010   TOE SURGERY Right Hammer Toe   2004   TONSILLECTOMY     Family History  Problem Relation Age of Onset   Heart attack Paternal Aunt    Hypertension Mother    Diabetes Maternal Aunt    Cancer Paternal Aunt        stomach   Cancer Sister    Breast cancer Other    Social History   Socioeconomic History   Marital status: Widowed    Spouse name: Not on file   Number of children: 1   Years of education: 51   Highest education level: 12th grade  Occupational History   Occupation:  retired-semi    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 Semi retired- still working at school. She has one child (daughter). She enjoys spending time with her daughter and her mom.    Social Determinants of Health   Financial Resource Strain: Low Risk  (07/31/2022)   Overall Financial Resource Strain (CARDIA)    Difficulty of Paying Living Expenses: Not hard at all  Food Insecurity: No Food Insecurity (07/31/2022)  Hunger Vital Sign    Worried About Running Out of Food in the Last Year: Never true    Ran Out of Food in the Last Year: Never true  Transportation Needs: No Transportation Needs (07/31/2022)   PRAPARE - Hydrologist (Medical): No    Lack of Transportation (Non-Medical): No  Physical Activity: Insufficiently Active (07/31/2022)   Exercise Vital Sign    Days of Exercise per Week: 2 days    Minutes of Exercise per Session: 30 min  Stress: No Stress Concern Present (07/31/2022)   [place]    Feeling of Stress : Not at all  Social Connections: Moderately Isolated (07/31/2022)   Social Connection and Isolation Panel [NHANES]    Frequency of Communication with Friends and Family: More than three times a week    Frequency of Social Gatherings with Friends and Family: More than three times a week    Attends Religious Services: More than 4 times per year    Active Member of [employer] or Organizations: No    Attends Archivist Meetings: Never    Marital Status: Widowed    Activities of Daily Living    07/31/2022    3:09 PM  In your present state of  health, do you have any difficulty performing the following activities:  Hearing? 0  Vision? 0  Difficulty concentrating or making decisions? 0  Walking or climbing stairs? 0  Dressing or bathing? 0  Doing errands, shopping? 0  Preparing Food and eating ? N  Using the Toilet? N  In the past six months, have you accidently leaked urine? N  Do you have problems with loss of bowel control? N  Managing your Medications? N  Managing your Finances? N  Housekeeping or managing your Housekeeping? N    Patient Education/Literacy How often do you need to have someone help you when you read instructions, pamphlets, or other written materials from your doctor or pharmacy?: 1 - Never What is the last grade level you completed in school?: 12th grade  Exercise Current Exercise Habits: Home exercise routine, Type of exercise: walking, Time (Minutes): 30, Frequency (Times/Week): 2, Weekly Exercise (Minutes/Week): 60, Intensity: Moderate, Exercise limited by: None identified  Diet Patient reports consuming 2 meals a day and 1 snack(s) a day Patient reports that her primary diet is: Regular Patient reports that she does have regular access to food.   Depression Screen    07/31/2022    3:05 PM 06/16/2022    4:07 PM 03/18/2022    2:54 PM 12/16/2021   10:00 AM 07/21/2021    3:05 PM 06/17/2021    9:32 AM 05/08/2020    3:19 PM  PHQ 2/9 Scores  PHQ - 2 Score 0 0 0 0 2 0 0  PHQ- 9 Score     2  0     Fall Risk    07/31/2022    3:04 PM 06/16/2022    4:06 PM 03/18/2022    2:54 PM 12/16/2021   10:00 AM 07/21/2021    3:03 PM  [place] in the past year? 0 0 0 0 0  Number falls in past yr: 0 0 0 0 0  Injury with Fall? 0 0 0 0 0  Risk for fall due to : No Fall Risks No Fall Risks No Fall Risks No Fall Risks No Fall Risks  Follow up Falls evaluation completed Falls evaluation completed  Falls evaluation completed Falls evaluation completed Falls evaluation completed     Objective:   BP 131/83  (BP Location: Right Arm, Patient Position: Sitting, Cuff Size: Large)   Pulse 70   Ht '4\' 11"'$  (1.499 m)   Wt 159 lb 1.9 oz (72.2 kg)   SpO2 97%   BMI 32.14 kg/m   Last Weight  Most recent update: 07/31/2022  3:02 PM    Weight  72.2 kg (159 lb 1.9 oz)             Body mass index is 32.14 kg/m.  Hearing/Vision  Bonnie Torres did not have difficulty with hearing/understanding during the face-to-face interview Bonnie Torres did not have difficulty with her vision during the face-to-face interview Reports that she has had a formal eye exam by an eye care professional within the past year Reports that she has not had a formal hearing evaluation within the past year  Cognitive Function:    07/31/2022    3:10 PM 07/21/2021    3:18 PM 05/08/2020    2:55 PM 04/18/2020    2:43 PM 04/25/2019    3:57 PM  6CIT Screen  What Year? 0 points 0 points 0 points 0 points 0 points  What month? 0 points 0 points 0 points 0 points 0 points  What time? 0 points 0 points 0 points 0 points 0 points  Count back from 20 0 points 0 points 0 points 0 points 0 points  Months in reverse 0 points 0 points 0 points 0 points 0 points  Repeat phrase 2 points 2 points 2 points 0 points 0 points  Total Score 2 points 2 points 2 points 0 points 0 points    Normal Cognitive Function Screening: Yes (Normal:0-7, Significant for Dysfunction: >8)  Immunization & Health Maintenance Record Immunization History  Administered Date(s) Administered   COVID-19, mRNA, vaccine(Comirnaty)12 years and older 06/16/2022   Influenza,inj,Quad PF,6+ Mos 04/15/2015, 03/23/2016, 03/31/2017, 03/07/2018, 04/05/2019, 04/18/2020, 03/18/2022   Influenza-Unspecified 04/28/2011, 05/29/2013, 04/27/2021   PFIZER(Purple Top)SARS-COV-2 Vaccination 09/28/2019, 10/26/2019, 06/23/2020, 04/02/2021   PPD Test 02/16/2018   Pneumococcal Polysaccharide-23 12/01/2017   Tdap 12/09/2010, 10/12/2014   Zoster Recombinat (Shingrix) 12/26/2018, 04/27/2021    Health  Maintenance  Topic Date Due   Diabetic kidney evaluation - eGFR measurement  08/26/2022 (Originally 08/25/2022)   COVID-19 Vaccine (6 - 2023-24 season) 08/11/2022   FOOT EXAM  11/21/2022   HEMOGLOBIN A1C  12/16/2022   Diabetic kidney evaluation - Urine ACR  03/19/2023   OPHTHALMOLOGY EXAM  04/03/2023   Fecal DNA (Cologuard)  05/28/2023   Medicare Annual Wellness (AWV)  08/01/2023   MAMMOGRAM  09/04/2023   DTaP/Tdap/Td (3 - Td or Tdap) 10/11/2024   INFLUENZA VACCINE  Completed   Hepatitis C Screening  Completed   HIV Screening  Completed   Zoster Vaccines- Shingrix  Completed   HPV VACCINES  Aged Out       Assessment  This is a routine wellness examination for [employer].  Health Maintenance: Due or Overdue There are no preventive care reminders to display for this patient.   Bonnie Torres does not need a referral for Community Assistance: Care Management:   no Social Work:    no Prescription Assistance:  no Nutrition/Diabetes Education:  no   Plan:  Personalized Goals  Goals Addressed               This Visit's Progress     Patient Stated (pt-stated)  07/31/2022 AWV Goal: Exercise for General Health  Patient will verbalize understanding of the benefits of increased physical activity: Exercising regularly is important. It will improve your overall fitness, flexibility, and endurance. Regular exercise also will improve your overall health. It can help you control your weight, reduce stress, and improve your bone density. Over the next year, patient will increase physical activity as tolerated with a goal of at least 150 minutes of moderate physical activity per week.  You can tell that you are exercising at a moderate intensity if your heart starts beating faster and you start breathing faster but can still hold a conversation. Moderate-intensity exercise ideas include: Walking 1 mile (1.6 km) in about 15 minutes Biking Hiking Golfing Dancing Water  aerobics Patient will verbalize understanding of everyday activities that increase physical activity by providing examples like the following: Yard work, such as: Sales promotion account executive Gardening Washing windows or floors Patient will be able to explain general safety guidelines for exercising:  Before you start a new exercise program, talk with your health care provider. Do not exercise so much that you hurt yourself, feel dizzy, or get very short of breath. Wear comfortable clothes and wear shoes with good support. Drink plenty of water while you exercise to prevent dehydration or heat stroke. Work out until your breathing and your heartbeat get faster.        Personalized Health Maintenance & Screening Recommendations  Screening mammography- due in march  Lung Cancer Screening Recommended: no (Low Dose CT Chest recommended if Age 52-80 years, 30 pack-year currently smoking OR have quit w/in past 15 years) Hepatitis C Screening recommended: no HIV Screening recommended: no  Advanced Directives: Written information was not given per the patient's request.  Referrals & Orders No orders of the defined types were placed in this encounter.   Follow-up Plan Follow-up with Donella Stade, PA-C as planned Schedule mammogram in March.  Medicare wellness visit in one year. AVS Printed and given to the patient.   I have personally reviewed and noted the following in the patient's chart:   Medical and social history Use of alcohol, tobacco or illicit drugs  Current medications and supplements Functional ability and status Nutritional status Physical activity Advanced directives List of other physicians Hospitalizations, surgeries, and ER visits in previous 12 months Vitals Screenings to include cognitive, depression, and falls Referrals and appointments  In addition, I have reviewed and discussed  with patient certain preventive protocols, quality metrics, and best practice recommendations. A written personalized care plan for preventive services as well as general preventive health recommendations were provided to patient.     Tinnie Gens, RN BSN  07/31/2022

## 2022-09-01 ENCOUNTER — Other Ambulatory Visit: Payer: Self-pay

## 2022-09-01 DIAGNOSIS — D49512 Neoplasm of unspecified behavior of left kidney: Secondary | ICD-10-CM | POA: Diagnosis not present

## 2022-09-01 DIAGNOSIS — F5101 Primary insomnia: Secondary | ICD-10-CM

## 2022-09-01 MED ORDER — ZOLPIDEM TARTRATE 10 MG PO TABS: 5.0000 mg | ORAL_TABLET | Freq: Every day | ORAL | 1 refills | Status: DC

## 2022-09-01 NOTE — Telephone Encounter (Signed)
Patients wants a refill on zolpidem '10mg'$   Last seen 07/31/22 Last filled 06/05/22

## 2022-09-04 DIAGNOSIS — E042 Nontoxic multinodular goiter: Secondary | ICD-10-CM | POA: Diagnosis not present

## 2022-09-04 DIAGNOSIS — K769 Liver disease, unspecified: Secondary | ICD-10-CM | POA: Diagnosis not present

## 2022-09-04 DIAGNOSIS — D49512 Neoplasm of unspecified behavior of left kidney: Secondary | ICD-10-CM | POA: Diagnosis not present

## 2022-09-04 DIAGNOSIS — N289 Disorder of kidney and ureter, unspecified: Secondary | ICD-10-CM | POA: Diagnosis not present

## 2022-09-08 DIAGNOSIS — Z85528 Personal history of other malignant neoplasm of kidney: Secondary | ICD-10-CM | POA: Diagnosis not present

## 2022-09-08 DIAGNOSIS — D49512 Neoplasm of unspecified behavior of left kidney: Secondary | ICD-10-CM | POA: Diagnosis not present

## 2022-09-16 ENCOUNTER — Encounter: Payer: Self-pay | Admitting: Physician Assistant

## 2022-09-16 ENCOUNTER — Ambulatory Visit (INDEPENDENT_AMBULATORY_CARE_PROVIDER_SITE_OTHER): Payer: 59 | Admitting: Physician Assistant

## 2022-09-16 VITALS — BP 131/81 | HR 75 | Ht 59.0 in | Wt 159.0 lb

## 2022-09-16 DIAGNOSIS — Z23 Encounter for immunization: Secondary | ICD-10-CM | POA: Diagnosis not present

## 2022-09-16 DIAGNOSIS — E785 Hyperlipidemia, unspecified: Secondary | ICD-10-CM | POA: Diagnosis not present

## 2022-09-16 DIAGNOSIS — I10 Essential (primary) hypertension: Secondary | ICD-10-CM | POA: Diagnosis not present

## 2022-09-16 DIAGNOSIS — N1831 Chronic kidney disease, stage 3a: Secondary | ICD-10-CM

## 2022-09-16 DIAGNOSIS — F5101 Primary insomnia: Secondary | ICD-10-CM

## 2022-09-16 DIAGNOSIS — E1122 Type 2 diabetes mellitus with diabetic chronic kidney disease: Secondary | ICD-10-CM | POA: Diagnosis not present

## 2022-09-16 DIAGNOSIS — L700 Acne vulgaris: Secondary | ICD-10-CM | POA: Diagnosis not present

## 2022-09-16 LAB — POCT UA - MICROALBUMIN
Creatinine, POC: 10 mg/dL
Microalbumin Ur, POC: 10 mg/L

## 2022-09-16 LAB — POCT GLYCOSYLATED HEMOGLOBIN (HGB A1C): Hemoglobin A1C: 6 % — AB (ref 4.0–5.6)

## 2022-09-16 NOTE — Progress Notes (Signed)
Established Patient Office Visit  Subjective   Patient ID: Bonnie Torres, female    DOB: Nov 13, 1955  Age: 67 y.o. MRN: [id number]  Chief Complaint  Patient presents with   Follow-up   Diabetes    HPI Pt is a 67 yo female with T2DM, HTN, remission of renal carcinoma who presents to the clinic for follow up and medication refills.   She is compliant with medications. She does not check her sugars. 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 She denies any CP, palpitations, headaches or vision changes. No hypoglycemic events.  She is only on metformin for diabetes. She has watched her sugars and carbs over the last 3 months.   Ambien is still effective for sleep.   .. Active Ambulatory Problems    Diagnosis Date Noted   Insomnia 08/31/2014   Essential hypertension, benign 08/31/2014   Hyperlipidemia 09/28/2014   Bilateral low back pain without sciatica 04/17/2015   Primary gout 10/16/2015   Breast calcification, right 11/13/2015   Elevated testosterone level in female 03/25/2016   Mass of kidney 04/05/2016   Uterine mass 04/27/2016   Lymphangioma 06/18/2016   Hot flashes due to surgical menopause 08/19/2016   Obesity (BMI 30.0-34.9) 09/16/2016   Hypokalemia 04/02/2017   Anterior cervical adenopathy 05/19/2017   Trigger middle finger of left hand 12/01/2017   Type 2 diabetes mellitus with stage 3a chronic kidney disease, without long-term current use of insulin ([place]) 12/03/2017   Class 1 obesity due to excess calories without serious comorbidity with body mass index (BMI) of 33.0 to 33.9 in adult 12/03/2017   Elevated serum creatinine 12/07/2017   Irregular heart rate 04/06/2018   Hypotension 04/06/2018   Mid back pain on left side 07/29/2018   Personal history of gout 01/25/2019   Acute gout of right foot 01/25/2019   Elevated uric acid in blood 01/25/2019   Cubital tunnel syndrome, left 11/20/2019   Neoplasm of left kidney  11/30/2019   Bilateral plantar fasciitis 01/29/2020   neoplasm left kidney 01/29/2020   Hx of partial nephrectomy 01/29/2020   Stage 3b chronic kidney disease ([place]) 03/12/2020   DDD (degenerative disc disease), lumbar 11/12/2020   Acute pain of right knee 08/26/2021   Bilateral primary osteoarthritis of knee 08/27/2021   Idiopathic chronic gout of right knee without tophus 12/16/2021   Resolved Ambulatory Problems    Diagnosis Date Noted   Mass of omentum 04/05/2016   Hirsutism 04/05/2016   Mass of ovary 04/05/2016   Renal cell carcinoma ([place]) 08/18/2016   Past Medical History:  Diagnosis Date   Complication of anesthesia    Gout    Heart rate fast    Hypertension    PONV (postoperative nausea and vomiting)    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    Objective:     BP 131/81   Pulse 75   Ht 4\' 11"  (1.499 m)   Wt 159 lb (72.1 kg)   SpO2 100%   BMI 32.11 kg/m  BP Readings from Last 3 Encounters:  09/16/22 131/81  07/31/22 131/83  06/16/22 130/77   Wt Readings from Last 3 Encounters:  09/16/22 159 lb (72.1 kg)  07/31/22 159 lb 1.9 oz (72.2 kg)  06/16/22 160 lb (72.6 kg)    .[name] Results for orders placed or performed  in visit on 09/16/22  POCT glycosylated hemoglobin (Hb A1C)  Result Value Ref Range   Hemoglobin A1C 6.0 (A) 4.0 - 5.6 %   HbA1c POC (<> result, manual entry)     HbA1c, POC (prediabetic range)     HbA1c, POC (controlled diabetic range)    POCT UA - Microalbumin  Result Value Ref Range   Microalbumin Ur, POC 10 mg/L   Creatinine, POC 10 mg/dL   Albumin/Creatinine Ratio, Urine, POC 30-300      Physical Exam Constitutional:      Appearance: Normal appearance.  HENT:     Head: Normocephalic.  Cardiovascular:     Rate and Rhythm: Normal rate and regular rhythm.     Pulses: Normal pulses.  Pulmonary:     Effort:  Pulmonary effort is normal.     Breath sounds: Normal breath sounds.  Musculoskeletal:     Right lower leg: No edema.     Left lower leg: No edema.  Neurological:     General: No focal deficit present.     Mental Status: She is alert and oriented to person, place, and time.  Psychiatric:        Mood and Affect: Mood normal.      The 10-year ASCVD risk score (Arnett DK, et al., 2019) is: 18.3%    Assessment & Plan:  Marland KitchenMarland KitchenFaith was seen today for follow-up and diabetes.  Diagnoses and all orders for this visit:  Type 2 diabetes mellitus with stage 3a chronic kidney disease, without long-term current use of insulin (HCC) -     POCT glycosylated hemoglobin (Hb A1C) -     POCT UA - Microalbumin -     Lipid Panel w/reflex Direct LDL -     CBC with Differential/Platelet  Essential hypertension, benign -     COMPLETE METABOLIC PANEL WITH GFR -     CBC with Differential/Platelet  Hyperlipidemia LDL goal <70 -     TSH -     Lipid Panel w/reflex Direct LDL -     CBC with Differential/Platelet  Primary insomnia  Need for pneumococcal 20-valent conjugate vaccination -     Pneumococcal conjugate vaccine 20-valent   A1C improved Stay on same medications, metformin refilled.  Fasting labs ordered today.  BP to goal, on ARB.  On statin.  Eye and foot exam UTD.  Pneumonia 20 vaccine given today.  Flu and covid UTD Follow up in 3 months      Return in about 3 months (around 12/17/2022).    Iran Planas, PA-C

## 2022-09-21 ENCOUNTER — Encounter: Payer: Self-pay | Admitting: Physician Assistant

## 2022-09-27 ENCOUNTER — Other Ambulatory Visit: Payer: Self-pay | Admitting: Physician Assistant

## 2022-09-27 DIAGNOSIS — I10 Essential (primary) hypertension: Secondary | ICD-10-CM

## 2022-09-27 DIAGNOSIS — E119 Type 2 diabetes mellitus without complications: Secondary | ICD-10-CM

## 2022-09-28 DIAGNOSIS — N1831 Chronic kidney disease, stage 3a: Secondary | ICD-10-CM | POA: Diagnosis not present

## 2022-09-28 DIAGNOSIS — E785 Hyperlipidemia, unspecified: Secondary | ICD-10-CM | POA: Diagnosis not present

## 2022-09-28 DIAGNOSIS — I10 Essential (primary) hypertension: Secondary | ICD-10-CM | POA: Diagnosis not present

## 2022-09-28 DIAGNOSIS — E1122 Type 2 diabetes mellitus with diabetic chronic kidney disease: Secondary | ICD-10-CM | POA: Diagnosis not present

## 2022-09-29 LAB — CBC WITH DIFFERENTIAL/PLATELET
Absolute Monocytes: 353 cells/uL (ref 200–950)
Basophils Absolute: 42 cells/uL (ref 0–200)
Basophils Relative: 0.9 %
Eosinophils Absolute: 113 cells/uL (ref 15–500)
Eosinophils Relative: 2.4 %
HCT: 37.5 % (ref 35.0–45.0)
Hemoglobin: 12.8 g/dL (ref 11.7–15.5)
Lymphs Abs: 2509.8 cells/uL (ref 850–3900)
MCH: 26.1 pg — ABNORMAL LOW (ref 27.0–33.0)
MCHC: 34.1 g/dL (ref 32.0–36.0)
MCV: 76.4 fL — ABNORMAL LOW (ref 80.0–100.0)
MPV: 9.5 fL (ref 7.5–12.5)
Monocytes Relative: 7.5 %
Neutro Abs: 1683 cells/uL (ref 1500–7800)
Neutrophils Relative %: 35.8 %
Platelets: 310 10*3/uL (ref 140–400)
RBC: 4.91 10*6/uL (ref 3.80–5.10)
RDW: 14 % (ref 11.0–15.0)
Total Lymphocyte: 53.4 %
WBC: 4.7 10*3/uL (ref 3.8–10.8)

## 2022-09-29 LAB — COMPLETE METABOLIC PANEL WITH GFR
AG Ratio: 1.4 (calc) (ref 1.0–2.5)
ALT: 20 U/L (ref 6–29)
AST: 19 U/L (ref 10–35)
Albumin: 4.2 g/dL (ref 3.6–5.1)
Alkaline phosphatase (APISO): 76 U/L (ref 37–153)
BUN: 16 mg/dL (ref 7–25)
CO2: 27 mmol/L (ref 20–32)
Calcium: 9.8 mg/dL (ref 8.6–10.4)
Chloride: 103 mmol/L (ref 98–110)
Creat: 0.84 mg/dL (ref 0.50–1.05)
Globulin: 3.1 g/dL (calc) (ref 1.9–3.7)
Glucose, Bld: 106 mg/dL — ABNORMAL HIGH (ref 65–99)
Potassium: 4.6 mmol/L (ref 3.5–5.3)
Sodium: 138 mmol/L (ref 135–146)
Total Bilirubin: 0.4 mg/dL (ref 0.2–1.2)
Total Protein: 7.3 g/dL (ref 6.1–8.1)
eGFR: 77 mL/min/{1.73_m2} (ref >=60)

## 2022-09-29 LAB — LIPID PANEL W/REFLEX DIRECT LDL
Cholesterol: 153 mg/dL (ref <200)
HDL: 49 mg/dL — ABNORMAL LOW (ref >=50)
LDL Cholesterol (Calc): 87 mg/dL (calc)
Non-HDL Cholesterol (Calc): 104 mg/dL (calc) (ref <130)
Total CHOL/HDL Ratio: 3.1 (calc) (ref <5.0)
Triglycerides: 78 mg/dL (ref <150)

## 2022-09-29 LAB — TSH: TSH: 0.43 mIU/L (ref 0.40–4.50)

## 2022-09-29 NOTE — Progress Notes (Signed)
Bonnie Torres,   Kidney function looks GREAT! No anemia!  Thyroid in normal range but trending for TSH to be too low. Look out for any HYPERthyroid symptoms and we would need to recheck. Recheck in 3 months.  Cholesterol looks good but not quite to our goal of under 70 LDL with diabetes.  You are on maximum statin dose with 80mg  daily. Are you taking this daily?  To get to goal we should consider adding another medication to get you there are you ok with this? Repatha is best to consider which is every 2 week self injectable. I would like to discuss this with you.

## 2022-10-12 ENCOUNTER — Ambulatory Visit (INDEPENDENT_AMBULATORY_CARE_PROVIDER_SITE_OTHER): Payer: 59 | Admitting: Physician Assistant

## 2022-10-12 VITALS — BP 132/74 | HR 79 | Ht 59.0 in | Wt 157.0 lb

## 2022-10-12 DIAGNOSIS — Z23 Encounter for immunization: Secondary | ICD-10-CM | POA: Diagnosis not present

## 2022-10-12 DIAGNOSIS — E785 Hyperlipidemia, unspecified: Secondary | ICD-10-CM | POA: Diagnosis not present

## 2022-10-12 DIAGNOSIS — E1122 Type 2 diabetes mellitus with diabetic chronic kidney disease: Secondary | ICD-10-CM

## 2022-10-12 DIAGNOSIS — N1831 Chronic kidney disease, stage 3a: Secondary | ICD-10-CM | POA: Diagnosis not present

## 2022-10-12 MED ORDER — REPATHA SURECLICK 140 MG/ML ~~LOC~~ SOAJ: 140.0000 mg | SUBCUTANEOUS | 5 refills | Status: DC

## 2022-10-12 NOTE — Progress Notes (Unsigned)
Established Patient Office Visit  Subjective   Patient ID: Bonnie Torres, female    DOB: 1955/07/15  Age: 67 y.o. MRN: [id number]  Chief Complaint  Patient presents with   Follow-up    HPI Pt is a 67 yo female who presents to the clinic to go over cholesterol labs and suggestion to add repatha every 14 days.   Patient Active Problem List   Diagnosis Date Noted   Hyperlipidemia LDL goal <70 10/12/2022   Idiopathic chronic gout of right knee without tophus 12/16/2021   Bilateral primary osteoarthritis of knee 08/27/2021   Acute pain of right knee 08/26/2021   DDD (degenerative disc disease), lumbar 11/12/2020   Stage 3b chronic kidney disease 03/12/2020   Bilateral plantar fasciitis 01/29/2020   neoplasm left kidney 01/29/2020   Hx of partial nephrectomy 01/29/2020   Neoplasm of left kidney 11/30/2019   Cubital tunnel syndrome, left 11/20/2019   Personal history of gout 01/25/2019   Acute gout of right foot 01/25/2019   Elevated uric acid in blood 01/25/2019   Mid back pain on left side 07/29/2018   Irregular heart rate 04/06/2018   Hypotension 04/06/2018   Elevated serum creatinine 12/07/2017   Type 2 diabetes mellitus with stage 3a chronic kidney disease, without long-term current use of insulin 12/03/2017   Class 1 obesity due to excess calories without serious comorbidity with body mass index (BMI) of 33.0 to 33.9 in adult 12/03/2017   Trigger middle finger of left hand 12/01/2017   Anterior cervical adenopathy 05/19/2017   Hypokalemia 04/02/2017   Obesity (BMI 30.0-34.9) 09/16/2016   Hot flashes due to surgical menopause 08/19/2016   Lymphangioma 06/18/2016   Uterine mass 04/27/2016   Mass of kidney 04/05/2016   Elevated testosterone level in female 03/25/2016   Breast calcification, right 11/13/2015   Primary gout 10/16/2015   Bilateral low back pain without sciatica 04/17/2015   Hyperlipidemia 09/28/2014   Insomnia 08/31/2014   Essential hypertension, benign  08/31/2014   Past Medical History:  Diagnosis Date   Complication of anesthesia    Gout    1 yr ago right great toe   Heart rate fast    tx Metoprolol   Hyperlipidemia    Hypertension    PONV (postoperative nausea and vomiting)    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   Type 2 diabetes mellitus without complication, without long-term current use of insulin 12/03/2017   Past Surgical History:  Procedure Laterality Date   ABDOMINAL HYSTERECTOMY N/A 06/18/2016   Procedure: HYSTERECTOMY ABDOMINAL TOTAL;  Surgeon: Adolphus Birchwood, MD;  Location: [facility];  Service: Gynecology;  Laterality: N/A;   BREAST EXCISIONAL BIOPSY Right    BREAST SURGERY Right    breast biopsy-benign   CHOLECYSTECTOMY     MASS EXCISION N/A 06/18/2016   Procedure: RESECTION RETROPERITONEAL MASS WITH RETORPERITONEAL EXPLORATION ,LIGATION CHYLI CISTERNA,OPEN  CHOLECYSTECTOMY WITH MOBILATION HEPATIC FLEXURE;  Surgeon: Forde Dandy, MD;  Location: [facility];  Service: General;  Laterality: N/A;   PARTIAL NEPHRECTOMY Right 06/18/2016   Procedure: OPEN RIGHT NEPHRECTOMY PARTIAL;  Surgeon: Heloise Purpura, MD;  Location: [facility];  Service: Urology;  Laterality: Right;   ROBOTIC ASSITED PARTIAL NEPHRECTOMY Left 11/30/2019   Procedure: ATTEMPTED XI ROBOTIC ASSITED LAPAROSCOPIC CONVERTED TO OPEN PARTIAL NEPHRECTOMY;  Surgeon: Heloise Purpura, MD;  Location: [facility];  Service: Urology;  Laterality: Left;   SALPINGOOPHORECTOMY Bilateral 06/18/2016   Procedure: BILATERAL SALPINGO OOPHORECTOMY;  Surgeon: Adolphus Birchwood, MD;  Location: [facility];  Service: Gynecology;  Laterality:  Bilateral;   SHOULDER SURGERY Right 2010   TOE SURGERY Right Hammer Toe   2004   TONSILLECTOMY     Family History  Problem Relation Age of Onset   Heart attack Paternal Aunt    Hypertension Mother    Diabetes Maternal Aunt    Cancer Paternal Aunt        stomach   Cancer Sister    Breast cancer Other    No Known Allergies    Review of Systems  All other systems  reviewed and are negative.     Objective:     BP (!) 140/81   Pulse 79   Ht 4\' 11"  (1.499 m)   Wt 157 lb (71.2 kg)   SpO2 99%   BMI 31.71 kg/m  BP Readings from Last 3 Encounters:  10/12/22 (!) 140/81  09/16/22 131/81  07/31/22 131/83   Wt Readings from Last 3 Encounters:  10/12/22 157 lb (71.2 kg)  09/16/22 159 lb (72.1 kg)  07/31/22 159 lb 1.9 oz (72.2 kg)      Physical Exam Constitutional:      Appearance: Normal appearance.  Cardiovascular:     Rate and Rhythm: Normal rate and regular rhythm.  Pulmonary:     Effort: Pulmonary effort is normal.     Breath sounds: Normal breath sounds.  Musculoskeletal:     Right lower leg: No edema.     Left lower leg: No edema.  Neurological:     General: No focal deficit present.     Mental Status: She is alert and oriented to person, place, and time.  Psychiatric:        Mood and Affect: Mood normal.     Last metabolic panel Lab Results  Component Value Date   GLUCOSE 106 (H) 09/28/2022   NA 138 09/28/2022   K 4.6 09/28/2022   CL 103 09/28/2022   CO2 27 09/28/2022   BUN 16 09/28/2022   CREATININE 0.84 09/28/2022   EGFR 77 09/28/2022   CALCIUM 9.8 09/28/2022   PROT 7.3 09/28/2022   ALBUMIN 4.5 06/12/2016   BILITOT 0.4 09/28/2022   ALKPHOS 53 06/12/2016   AST 19 09/28/2022   ALT 20 09/28/2022   ANIONGAP 9 12/05/2019   Last lipids Lab Results  Component Value Date   CHOL 153 09/28/2022   HDL 49 (L) 09/28/2022   LDLCALC 87 09/28/2022   TRIG 78 09/28/2022   CHOLHDL 3.1 09/28/2022   Last hemoglobin A1c Lab Results  Component Value Date   HGBA1C 6.0 (A) 09/16/2022      The 10-year ASCVD risk score (Arnett DK, et al., 2019) is: 20.4%    Assessment & Plan:  Marland KitchenMarland KitchenFaith was seen today for follow-up.  Diagnoses and all orders for this visit:  Hyperlipidemia LDL goal <70 -     Evolocumab (REPATHA SURECLICK) 140 MG/ML SOAJ; Inject 140 mg into the skin every 14 (fourteen) days.  Type 2 diabetes mellitus  with stage 3a chronic kidney disease, without long-term current use of insulin -     Evolocumab (REPATHA SURECLICK) 140 MG/ML SOAJ; Inject 140 mg into the skin every 14 (fourteen) days.  Need for COVID-19 vaccine [employer] Fall 2023 Covid-19 Vaccine 61yrs and older   LDL not to goal of under 70, 87 Discussed adding repatha Showed patient pen and how to use in office BP recheck and better on 2nd recheck HO given on SE of repatha Add this to your statin Recheck lipid level  in 6 months   Tandy Gaw, [place]

## 2022-10-12 NOTE — Patient Instructions (Addendum)
Check lipid level in 6 months.   Evolocumab Injection What is this medication? EVOLOCUMAB (e voe LOK ue mab) treats high cholesterol. It may also be used to lower the risk of heart attack, stroke, and a type of heart surgery. It works by decreasing bad cholesterol (such as LDL) in your blood. It is a monoclonal antibody. Changes to diet and exercise are often combined with this medication. This medicine may be used for other purposes; ask your health care provider or pharmacist if you have questions. COMMON BRAND NAME(S): Repatha, Repatha SureClick What should I tell my care team before I take this medication? They need to know if you have any of these conditions: An unusual or allergic reaction to evolocumab, latex, other medications, foods, dyes, or preservatives Pregnant or trying to get pregnant Breast-feeding How should I use this medication? This medication is injected under the skin. You will be taught how to prepare and give it. Take it as directed on the prescription label at the same time every day. Keep taking it unless your care team tells you to stop. It is important that you put your used needles and syringes in a special sharps container. Do not put them in a trash can. If you do not have a sharps container, call your pharmacist or care team to get one. This medication comes with INSTRUCTIONS FOR USE. Ask your pharmacist for directions on how to use this medication. Read the information carefully. Talk to your pharmacist or care team if you have questions. Talk to your care team about the use of this medication in children. While it may be prescribed for children as young as 10 years for selected conditions, precautions do apply. Overdosage: If you think you have taken too much of this medicine contact a poison control center or emergency room at once. NOTE: This medicine is only for you. Do not share this medicine with others. What if I miss a dose? It is important not to miss any  doses. Talk to your care team about what to do if you miss a dose. What may interact with this medication? Interactions are not expected. This list may not describe all possible interactions. Give your health care provider a list of all the medicines, herbs, non-prescription drugs, or dietary supplements you use. Also tell them if you smoke, drink alcohol, or use illegal drugs. Some items may interact with your medicine. What should I watch for while using this medication? Visit your care team for regular checks on your progress. Tell your care team if your symptoms do not start to get better or if they get worse. You may need blood work while you are taking this medication. Do not wear the on-body infuser during an MRI. Taking this medication is only part of a total heart healthy program. Ask your care team if there are other changes you can make to improve your overall health. What side effects may I notice from receiving this medication? Side effects that you should report to your care team as soon as possible: Allergic reactions or angioedema--skin rash, itching or hives, swelling of the face, eyes, lips, tongue, arms, or legs, trouble swallowing or breathing Side effects that usually do not require medical attention (report to your care team if they continue or are bothersome): Back pain Flu-like symptoms--fever, chills, muscle pain, cough, headache, fatigue Pain, redness, or irritation at injection site Runny or stuffy nose Sore throat This list may not describe all possible side effects. Call your doctor  for medical advice about side effects. You may report side effects to FDA at 1-800-FDA-1088. Where should I keep my medication? Keep out of the reach of children and pets. Store in a refrigerator or at room temperature between 20 and 25 degrees C (68 and 77 degrees F). Refrigeration (preferred): Store it in the refrigerator. Do not freeze. Keep it in the original carton until you are  ready to take it. Remove the dose from the carton about 30 minutes before it is time for you to take it. Get rid of any unused medication after the expiration date. Room temperature: This medication may be stored at room temperature for up to 30 days. Keep it in the original carton until you are ready to take it. If it is stored at room temperature, get rid of any unused medication after 30 days or after it expires, whichever is first. Protect from light. Do not shake. Avoid exposure to extreme heat. To get rid of medications that are no longer needed or have expired: Take the medication to a medication take-back program. Check with your pharmacy or law enforcement to find a location. If you cannot return the medication, ask your pharmacist or care team how to get rid of this medication safely. NOTE: This sheet is a summary. It may not cover all possible information. If you have questions about this medicine, talk to your doctor, pharmacist, or health care provider.  2023 Elsevier/Gold Standard (2021-06-17 00:00:00)

## 2022-10-13 ENCOUNTER — Encounter: Payer: Self-pay | Admitting: Physician Assistant

## 2022-10-28 ENCOUNTER — Encounter: Payer: Self-pay | Admitting: Physician Assistant

## 2022-10-28 ENCOUNTER — Ambulatory Visit (INDEPENDENT_AMBULATORY_CARE_PROVIDER_SITE_OTHER): Payer: 59 | Admitting: Physician Assistant

## 2022-10-28 VITALS — BP 129/74 | HR 75 | Resp 20 | Ht 59.0 in | Wt 156.1 lb

## 2022-10-28 DIAGNOSIS — F4321 Adjustment disorder with depressed mood: Secondary | ICD-10-CM

## 2022-10-28 DIAGNOSIS — Z636 Dependent relative needing care at home: Secondary | ICD-10-CM

## 2022-10-28 DIAGNOSIS — R4589 Other symptoms and signs involving emotional state: Secondary | ICD-10-CM | POA: Diagnosis not present

## 2022-10-28 MED ORDER — ESCITALOPRAM OXALATE 5 MG PO TABS: 5.0000 mg | ORAL_TABLET | Freq: Every day | ORAL | 1 refills | Status: DC

## 2022-10-28 NOTE — Progress Notes (Signed)
Acute Office Visit  Subjective:     Patient ID: Bonnie Torres, female    DOB: Dec 05, 1955, 67 y.o.   MRN: [id number]  Chief Complaint  Patient presents with   Depression    HPI Patient is in today for mood. She has been struggling with feeling very down for a few weeks. It is not like her to be this tearful. She is worn out by working every day and having to take care of her mother alone. Her brother died from PE. Her sister died last year from suicide. Her mother is 44 yo and depends a lot on her. Pt feels like she has not really grieved her siblings death and her dx of renal cell carcinoma because she has been taking care of everyone else. Denies any SI/HC. She has not tried any medication.   .. Active Ambulatory Problems    Diagnosis Date Noted   Insomnia 08/31/2014   Essential hypertension, benign 08/31/2014   Hyperlipidemia 09/28/2014   Bilateral low back pain without sciatica 04/17/2015   Primary gout 10/16/2015   Breast calcification, right 11/13/2015   Elevated testosterone level in female 03/25/2016   Mass of kidney 04/05/2016   Uterine mass 04/27/2016   Lymphangioma 06/18/2016   Hot flashes due to surgical menopause 08/19/2016   Obesity (BMI 30.0-34.9) 09/16/2016   Hypokalemia 04/02/2017   Anterior cervical adenopathy 05/19/2017   Trigger middle finger of left hand 12/01/2017   Type 2 diabetes mellitus with stage 3a chronic kidney disease, without long-term current use of insulin (HCC) 12/03/2017   Class 1 obesity due to excess calories without serious comorbidity with body mass index (BMI) of 33.0 to 33.9 in adult 12/03/2017   Elevated serum creatinine 12/07/2017   Irregular heart rate 04/06/2018   Hypotension 04/06/2018   Mid back pain on left side 07/29/2018   Personal history of gout 01/25/2019   Acute gout of right foot 01/25/2019   Elevated uric acid in blood 01/25/2019   Cubital tunnel syndrome, left 11/20/2019   Neoplasm of left kidney 11/30/2019    Bilateral plantar fasciitis 01/29/2020   neoplasm left kidney 01/29/2020   Hx of partial nephrectomy 01/29/2020   Stage 3b chronic kidney disease (HCC) 03/12/2020   DDD (degenerative disc disease), lumbar 11/12/2020   Acute pain of right knee 08/26/2021   Bilateral primary osteoarthritis of knee 08/27/2021   Idiopathic chronic gout of right knee without tophus 12/16/2021   Hyperlipidemia LDL goal <70 10/12/2022   Cyst of Bartholin's gland duct 04/03/2013   Pain in toe of right foot 12/08/2011   Pure hypercholesterolemia 04/28/2011   Routine health maintenance 03/03/2012   Caregiver stress 10/28/2022   Depressed mood 10/28/2022   Unresolved grief 10/28/2022   Resolved Ambulatory Problems    Diagnosis Date Noted   Mass of omentum 04/05/2016   Hirsutism 04/05/2016   Mass of ovary 04/05/2016   Renal cell carcinoma (HCC) 08/18/2016   Past Medical History:  Diagnosis Date   Complication of anesthesia    Gout    Heart rate fast    Hypertension    PONV (postoperative nausea and vomiting)    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      Objective:    BP 129/74 (BP Location: Right Arm, Cuff Size: Normal)   Pulse 75   Resp 20   Ht 4\' 11"  (1.499 m)  Wt 156 lb 1.3 oz (70.8 kg)   SpO2 96%   BMI 31.52 kg/m  BP Readings from Last 3 Encounters:  10/28/22 129/74  10/12/22 132/74  09/16/22 131/81   Wt Readings from Last 3 Encounters:  10/28/22 156 lb 1.3 oz (70.8 kg)  10/12/22 157 lb (71.2 kg)  09/16/22 159 lb (72.1 kg)    ..    10/28/2022    2:10 PM 09/16/2022    3:39 PM 07/31/2022    3:05 PM 06/16/2022    4:07 PM 03/18/2022    2:54 PM  Depression screen PHQ 2/9  Decreased Interest 2 0 0 0 0  Down, Depressed, Hopeless 3 0 0 0 0  PHQ - 2 Score 5 0 0 0 0  Altered sleeping 0      Tired, decreased energy 2      Change in appetite 2      Feeling bad or  failure about yourself  2      Trouble concentrating 1      Moving slowly or fidgety/restless 1      Suicidal thoughts 0      PHQ-9 Score 13      Difficult doing work/chores Very difficult       ..    10/28/2022    2:11 PM 05/08/2020    3:19 PM 12/26/2018    3:09 PM 06/28/2018    4:06 PM  GAD 7 : Generalized Anxiety Score  Nervous, Anxious, on Edge 3 0 0 0  Control/stop worrying 3 0 0 0  Worry too much - different things 3 0 0 0  Trouble relaxing 3 0 0 0  Restless 2 0 0 0  Easily annoyed or irritable 1 0 0 0  Afraid - awful might happen 3 0 0 0  Total GAD 7 Score 18 0 0 0  Anxiety Difficulty Very difficult Not difficult at all Not difficult at all Not difficult at all      Physical Exam Constitutional:      Appearance: Normal appearance. She is obese.  HENT:     Head: Normocephalic.  Cardiovascular:     Rate and Rhythm: Normal rate.  Pulmonary:     Effort: Pulmonary effort is normal.  Neurological:     General: No focal deficit present.     Mental Status: She is alert and oriented to person, place, and time.  Psychiatric:     Comments: tearful         Assessment & Plan:  Marland KitchenMarland KitchenFaith was seen today for depression.  Diagnoses and all orders for this visit:  Caregiver stress -     escitalopram (LEXAPRO) 5 MG tablet; Take 1 tablet (5 mg total) by mouth daily. -     Ambulatory referral to Psychology  Depressed mood -     escitalopram (LEXAPRO) 5 MG tablet; Take 1 tablet (5 mg total) by mouth daily. -     Ambulatory referral to Psychology  Unresolved grief -     escitalopram (LEXAPRO) 5 MG tablet; Take 1 tablet (5 mg total) by mouth daily. -     Ambulatory referral to Psychology   PHQ/GAD not to goal  Start lexapro Discussed SE Referral to counseling  Written out of work for stabilization until 5/13.  Discussed personal boundaries and healthy self care tips  Return in about 4 weeks (around 11/25/2022) for 4-6 weeks for F/U on depression/anxiety.  Tandy Gaw, PA-C

## 2022-10-28 NOTE — Patient Instructions (Signed)
Will refer for counseling.  Start lexapro 5mg  at bedtime.   Managing Depression, Adult Depression is a mental health condition that affects your thoughts, feelings, and actions. Being diagnosed with depression can bring you relief if you did not know why you have felt or behaved a certain way. It could also leave you feeling overwhelmed. Finding ways to manage your symptoms can help you feel more positive about your future. How to manage lifestyle changes Being depressed is difficult. Depression can increase the level of everyday stress. Stress can make depression symptoms worse. You may believe your symptoms cannot be managed or will never improve. However, there are many things you can try to help manage your symptoms. There is hope. Managing stress  Stress is your body's reaction to life changes and events, both good and bad. Stress can add to your feelings of depression. Learning to manage your stress can help lessen your feelings of depression. Try some of the following approaches to reducing your stress (stress reduction techniques): Listen to music that you enjoy and that inspires you. Try using a meditation app or take a meditation class. Develop a practice that helps you connect with your spiritual self. Walk in nature, pray, or go to a place of worship. Practice deep breathing. To do this, inhale slowly through your nose. Pause at the top of your inhale for a few seconds and then exhale slowly, letting yourself relax. Repeat this three or four times. Practice yoga to help relax and work your muscles. Choose a stress reduction technique that works for you. These techniques take time and practice to develop. Set aside 5-15 minutes a day to do them. Therapists can offer training in these techniques. Do these things to help manage stress: Keep a journal. Know your limits. Set healthy boundaries for yourself and others, such as saying "no" when you think something is too much. Pay attention  to how you react to certain situations. You may not be able to control everything, but you can change your reaction. Add humor to your life by watching funny movies or shows. Make time for activities that you enjoy and that relax you. Spend less time using electronics, especially at night before bed. The light from screens can make your brain think it is time to get up rather than go to bed.  Medicines Medicines, such as antidepressants, are often a part of treatment for depression. Talk with your pharmacist or health care provider about all the medicines, supplements, and herbal products that you take, their possible side effects, and what medicines and other products are safe to take together. Make sure to report any side effects you may have to your health care provider. Relationships Your health care provider may suggest family therapy, couples therapy, or individual therapy as part of your treatment. How to recognize changes Everyone responds differently to treatment for depression. As you recover from depression, you may start to: Have more interest in doing activities. Feel more hopeful. Have more energy. Eat a more regular amount of food. Have better mental focus. It is important to recognize if your depression is not getting better or is getting worse. The symptoms you had in the beginning may return, such as: Feeling tired. Eating too much or too little. Sleeping too much or too little. Feeling restless, agitated, or hopeless. Trouble focusing or making decisions. Having unexplained aches and pains. Feeling irritable, angry, or aggressive. If you or your family members notice these symptoms coming back, let your health care  provider know right away. Follow these instructions at home: Activity Try to get some form of exercise each day, such as walking. Try yoga, mindfulness, or other stress reduction techniques. Participate in group activities if you are able. Lifestyle Get  enough sleep. Cut down on or stop using caffeine, tobacco, alcohol, and any other harmful substances. Eat a healthy diet that includes plenty of vegetables, fruits, whole grains, low-fat dairy products, and lean protein. Limit foods that are high in solid fats, added sugar, or salt (sodium). General instructions Take over-the-counter and prescription medicines only as told by your health care provider. Keep all follow-up visits. It is important for your health care provider to check on your mood, behavior, and medicines. Your health care provider may need to make changes to your treatment. Where to find support Talking to others  Friends and family members can be sources of support and guidance. Talk to trusted friends or family members about your condition. Explain your symptoms and let them know that you are working with a health care provider to treat your depression. Tell friends and family how they can help. Finances Find mental health providers that fit with your financial situation. Talk with your health care provider if you are worried about access to food, housing, or medicine. Call your insurance company to learn about your co-pays and prescription plan. Where to find more information You can find support in your area from: Anxiety and Depression Association of America (ADAA): adaa.org Mental Health America: mentalhealthamerica.net [employer] on Mental Illness: nami.org Contact a health care provider if: You stop taking your antidepressant medicines, and you have any of these symptoms: Nausea. Headache. Light-headedness. Chills and body aches. Not being able to sleep (insomnia). You or your friends and family think your depression is getting worse. Get help right away if: You have thoughts of hurting yourself or others. Get help right away if you feel like you may hurt yourself or others, or have thoughts about taking your own life. Go to your nearest emergency room  or: Call 911. Call the National Suicide Prevention Lifeline at 249-531-1198 or 988. This is open 24 hours a day. Text the Crisis Text Line at 228-490-9323. This information is not intended to replace advice given to you by your health care provider. Make sure you discuss any questions you have with your health care provider. Document Revised: 10/21/2021 Document Reviewed: 10/21/2021 Elsevier Patient Education  2023 [employer].

## 2022-10-30 ENCOUNTER — Other Ambulatory Visit: Payer: Self-pay | Admitting: Physician Assistant

## 2022-10-30 DIAGNOSIS — E119 Type 2 diabetes mellitus without complications: Secondary | ICD-10-CM

## 2022-11-01 ENCOUNTER — Other Ambulatory Visit: Payer: Self-pay | Admitting: Physician Assistant

## 2022-11-01 DIAGNOSIS — I10 Essential (primary) hypertension: Secondary | ICD-10-CM

## 2022-11-01 DIAGNOSIS — E876 Hypokalemia: Secondary | ICD-10-CM

## 2022-11-20 ENCOUNTER — Ambulatory Visit: Payer: 59 | Admitting: Physician Assistant

## 2022-12-18 ENCOUNTER — Ambulatory Visit (INDEPENDENT_AMBULATORY_CARE_PROVIDER_SITE_OTHER): Payer: 59 | Admitting: Physician Assistant

## 2022-12-18 ENCOUNTER — Encounter: Payer: Self-pay | Admitting: Physician Assistant

## 2022-12-18 VITALS — BP 107/65 | HR 73 | Ht 59.0 in | Wt 159.0 lb

## 2022-12-18 DIAGNOSIS — Z636 Dependent relative needing care at home: Secondary | ICD-10-CM

## 2022-12-18 DIAGNOSIS — R4589 Other symptoms and signs involving emotional state: Secondary | ICD-10-CM

## 2022-12-18 DIAGNOSIS — M25561 Pain in right knee: Secondary | ICD-10-CM

## 2022-12-18 DIAGNOSIS — E876 Hypokalemia: Secondary | ICD-10-CM | POA: Diagnosis not present

## 2022-12-18 DIAGNOSIS — Z8739 Personal history of other diseases of the musculoskeletal system and connective tissue: Secondary | ICD-10-CM

## 2022-12-18 DIAGNOSIS — E1122 Type 2 diabetes mellitus with diabetic chronic kidney disease: Secondary | ICD-10-CM

## 2022-12-18 DIAGNOSIS — I10 Essential (primary) hypertension: Secondary | ICD-10-CM | POA: Diagnosis not present

## 2022-12-18 DIAGNOSIS — Z7984 Long term (current) use of oral hypoglycemic drugs: Secondary | ICD-10-CM | POA: Diagnosis not present

## 2022-12-18 DIAGNOSIS — F4321 Adjustment disorder with depressed mood: Secondary | ICD-10-CM

## 2022-12-18 DIAGNOSIS — N1831 Chronic kidney disease, stage 3a: Secondary | ICD-10-CM | POA: Diagnosis not present

## 2022-12-18 LAB — POCT GLYCOSYLATED HEMOGLOBIN (HGB A1C): Hemoglobin A1C: 6 % — AB (ref 4.0–5.6)

## 2022-12-18 MED ORDER — POTASSIUM CHLORIDE CRYS ER 20 MEQ PO TBCR: 20.0000 meq | EXTENDED_RELEASE_TABLET | Freq: Every day | ORAL | 2 refills | Status: DC

## 2022-12-18 MED ORDER — DICLOFENAC SODIUM 1 % EX GEL
CUTANEOUS | 1 refills | Status: DC
Start: 2022-12-18 — End: 2023-09-20

## 2022-12-18 MED ORDER — ESCITALOPRAM OXALATE 5 MG PO TABS: 5.0000 mg | ORAL_TABLET | Freq: Every day | ORAL | 1 refills | Status: DC

## 2022-12-18 NOTE — Progress Notes (Signed)
Established Patient Office Visit  Subjective   Patient ID: Bonnie Torres, female    DOB: Mar 03, 1956  Age: 66 y.o. MRN: [id number]  Chief Complaint  Patient presents with   Follow-up   Diabetes    HPI Pt is a 67 yo female with HTN,  repatha .[name] Active Ambulatory Problems    Diagnosis Date Noted   Insomnia 08/31/2014   Essential hypertension, benign 08/31/2014   Hyperlipidemia 09/28/2014   Bilateral low back pain without sciatica 04/17/2015   Primary gout 10/16/2015   Breast calcification, right 11/13/2015   Elevated testosterone level in female 03/25/2016   Mass of kidney 04/05/2016   Uterine mass 04/27/2016   Lymphangioma 06/18/2016   Hot flashes due to surgical menopause 08/19/2016   Obesity (BMI 30.0-34.9) 09/16/2016   Hypokalemia 04/02/2017   Anterior cervical adenopathy 05/19/2017   Trigger middle finger of left hand 12/01/2017   Type 2 diabetes mellitus with stage 3a chronic kidney disease, without long-term current use of insulin (HCC) 12/03/2017   Class 1 obesity due to excess calories without serious comorbidity with body mass index (BMI) of 33.0 to 33.9 in adult 12/03/2017   Elevated serum creatinine 12/07/2017   Irregular heart rate 04/06/2018   Hypotension 04/06/2018   Mid back pain on left side 07/29/2018   Personal history of gout 01/25/2019   Acute gout of right foot 01/25/2019   Elevated uric acid in blood 01/25/2019   Cubital tunnel syndrome, left 11/20/2019   Neoplasm of left kidney 11/30/2019   Bilateral plantar fasciitis 01/29/2020   neoplasm left kidney 01/29/2020   Hx of partial nephrectomy 01/29/2020   Stage 3b chronic kidney disease (HCC) 03/12/2020   DDD (degenerative disc disease), lumbar 11/12/2020   Acute pain of right knee 08/26/2021   Bilateral primary osteoarthritis of knee 08/27/2021   Idiopathic chronic gout of right knee without tophus 12/16/2021   Hyperlipidemia LDL goal <70 10/12/2022   Cyst of Bartholin's gland duct 04/03/2013    Pain in toe of right foot 12/08/2011   Pure hypercholesterolemia 04/28/2011   Routine health maintenance 03/03/2012   Caregiver stress 10/28/2022   Depressed mood 10/28/2022   Unresolved grief 10/28/2022   Resolved Ambulatory Problems    Diagnosis Date Noted   Mass of omentum 04/05/2016   Hirsutism 04/05/2016   Mass of ovary 04/05/2016   Renal cell carcinoma (HCC) 08/18/2016   Past Medical History:  Diagnosis Date   Complication of anesthesia    Gout    Heart rate fast    Hypertension    PONV (postoperative nausea and vomiting)    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     Objective:     BP 107/65 (BP Location: Left Arm, Patient Position: Sitting, Cuff Size: Normal)   Pulse 73   Ht 4\' 11"  (1.499 m)   Wt 159 lb (72.1 kg)   SpO2 98%   BMI 32.11 kg/m  BP Readings from Last 3 Encounters:  12/18/22 107/65  10/28/22 129/74  10/12/22 132/74   Wt Readings from Last 3 Encounters:  12/18/22 159 lb (72.1 kg)  10/28/22 156 lb 1.3 oz (70.8 kg)  10/12/22 157 lb (71.2 kg)    ..    12/18/2022   10:42 AM 12/18/2022   10:41 AM 10/28/2022    2:10 PM 09/16/2022  3:39 PM 07/31/2022    3:05 PM  Depression screen PHQ 2/9  Decreased Interest 1 1 2  0 0  Down, Depressed, Hopeless 0 0 3 0 0  PHQ - 2 Score 1 1 5  0 0  Altered sleeping  0 0    Tired, decreased energy  0 2    Change in appetite  0 2    Feeling bad or failure about yourself   0 2    Trouble concentrating  0 1    Moving slowly or fidgety/restless  0 1    Suicidal thoughts  0 0    PHQ-9 Score  1 13    Difficult doing work/chores  Not difficult at all Very difficult     ..    10/28/2022    2:11 PM 05/08/2020    3:19 PM 12/26/2018    3:09 PM 06/28/2018    4:06 PM  GAD 7 : Generalized Anxiety Score  Nervous, Anxious, on Edge 3 0 0 0   Control/stop worrying 3 0 0 0  Worry too much - different things 3 0 0 0  Trouble relaxing 3 0 0 0  Restless 2 0 0 0  Easily annoyed or irritable 1 0 0 0  Afraid - awful might happen 3 0 0 0  Total GAD 7 Score 18 0 0 0  Anxiety Difficulty Very difficult Not difficult at all Not difficult at all Not difficult at all      Physical Exam Constitutional:      Appearance: Normal appearance. She is obese.  HENT:     Head: Normocephalic.  Cardiovascular:     Rate and Rhythm: Normal rate and regular rhythm.     Pulses: Normal pulses.     Heart sounds: Normal heart sounds.  Pulmonary:     Breath sounds: Normal breath sounds.  Musculoskeletal:     Right lower leg: No edema.     Left lower leg: No edema.  Neurological:     General: No focal deficit present.     Mental Status: She is alert and oriented to person, place, and time.  Psychiatric:        Mood and Affect: Mood normal.      The 10-year ASCVD risk score (Arnett DK, et al., 2019) is: 11.2%    Assessment & Plan:  Marland KitchenMarland KitchenFaith was seen today for follow-up and diabetes.  Diagnoses and all orders for this visit:  Type 2 diabetes mellitus with stage 3a chronic kidney disease, without long-term current use of insulin (HCC) -     POCT HgB A1C  Essential hypertension, benign  Personal history of gout -     diclofenac Sodium (VOLTAREN) 1 % GEL; APPLY 4 GRAMS TOPICALLY 4 TIMES  DAILY TO LOWER EXTREMITY JOINT.  MANUFACTURER RECOMMENDS NOT  EXCEEDING 32 GRAMS/DAY  Acute pain of right knee -     diclofenac Sodium (VOLTAREN) 1 % GEL; APPLY 4 GRAMS TOPICALLY 4 TIMES  DAILY TO LOWER EXTREMITY JOINT.  MANUFACTURER RECOMMENDS NOT  EXCEEDING 32 GRAMS/DAY  Hypokalemia -     potassium chloride SA (KLOR-CON M) 20 MEQ tablet; Take 1 tablet (20 mEq total) by mouth daily.  Caregiver stress -     escitalopram (LEXAPRO) 5 MG tablet; Take 1 tablet (5 mg total) by mouth daily.  Unresolved grief -     escitalopram (LEXAPRO) 5 MG tablet; Take  1 tablet (5 mg total) by mouth daily.  Depressed mood -     escitalopram (LEXAPRO)  5 MG tablet; Take 1 tablet (5 mg total) by mouth daily.   PHQ/GAD much better Continue lexapro A1C looks great Continue metformin On statin, LDL not to goal not been able to afford repatha Eye and foot exam UTD Vaccines UTD Follow up in 3 months Labs UTD   Return in about 3 months (around 03/20/2023).    Tandy Gaw, PA-C

## 2023-03-13 ENCOUNTER — Other Ambulatory Visit: Payer: Self-pay | Admitting: Physician Assistant

## 2023-03-13 DIAGNOSIS — N1831 Chronic kidney disease, stage 3a: Secondary | ICD-10-CM

## 2023-03-15 ENCOUNTER — Other Ambulatory Visit: Payer: Self-pay | Admitting: Physician Assistant

## 2023-03-15 DIAGNOSIS — F5101 Primary insomnia: Secondary | ICD-10-CM

## 2023-03-18 NOTE — Telephone Encounter (Signed)
Pt called. She is following up on script for Zolpidem.

## 2023-03-22 ENCOUNTER — Ambulatory Visit (INDEPENDENT_AMBULATORY_CARE_PROVIDER_SITE_OTHER): Payer: 59 | Admitting: Physician Assistant

## 2023-03-22 ENCOUNTER — Ambulatory Visit: Payer: 59

## 2023-03-22 VITALS — BP 124/64 | HR 81 | Ht 59.0 in | Wt 160.0 lb

## 2023-03-22 DIAGNOSIS — E785 Hyperlipidemia, unspecified: Secondary | ICD-10-CM

## 2023-03-22 DIAGNOSIS — M25562 Pain in left knee: Secondary | ICD-10-CM | POA: Diagnosis not present

## 2023-03-22 DIAGNOSIS — G8929 Other chronic pain: Secondary | ICD-10-CM

## 2023-03-22 DIAGNOSIS — N1831 Chronic kidney disease, stage 3a: Secondary | ICD-10-CM

## 2023-03-22 DIAGNOSIS — I10 Essential (primary) hypertension: Secondary | ICD-10-CM | POA: Diagnosis not present

## 2023-03-22 DIAGNOSIS — Z7984 Long term (current) use of oral hypoglycemic drugs: Secondary | ICD-10-CM | POA: Diagnosis not present

## 2023-03-22 DIAGNOSIS — E1122 Type 2 diabetes mellitus with diabetic chronic kidney disease: Secondary | ICD-10-CM

## 2023-03-22 DIAGNOSIS — E119 Type 2 diabetes mellitus without complications: Secondary | ICD-10-CM

## 2023-03-22 DIAGNOSIS — R2242 Localized swelling, mass and lump, left lower limb: Secondary | ICD-10-CM | POA: Diagnosis not present

## 2023-03-22 DIAGNOSIS — Z23 Encounter for immunization: Secondary | ICD-10-CM | POA: Diagnosis not present

## 2023-03-22 DIAGNOSIS — M1712 Unilateral primary osteoarthritis, left knee: Secondary | ICD-10-CM | POA: Diagnosis not present

## 2023-03-22 LAB — POCT GLYCOSYLATED HEMOGLOBIN (HGB A1C): Hemoglobin A1C: 6.3 % — AB (ref 4.0–5.6)

## 2023-03-22 LAB — POCT UA - MICROALBUMIN
Albumin/Creatinine Ratio, Urine, POC: <30
Creatinine, POC: 200 mg/dL
Microalbumin Ur, POC: 30 mg/L

## 2023-03-22 MED ORDER — ATORVASTATIN CALCIUM 80 MG PO TABS
80.0000 mg | ORAL_TABLET | Freq: Every day | ORAL | 2 refills | Status: DC
Start: 2023-03-22 — End: 2023-05-10

## 2023-03-22 MED ORDER — METFORMIN HCL ER 750 MG PO TB24
750.0000 mg | ORAL_TABLET | Freq: Every day | ORAL | 2 refills | Status: DC
Start: 2023-03-22 — End: 2023-08-13

## 2023-03-22 NOTE — Patient Instructions (Signed)
Magnesium Glycinate 400mg  at bedtime

## 2023-03-22 NOTE — Progress Notes (Unsigned)
Established Patient Office Visit  Subjective   Patient ID: Bonnie Torres, female    DOB: 10/02/1955  Age: 67 y.o. MRN: [id number]  Chief Complaint  Patient presents with   Medical Management of Chronic Issues    HPI Pt is a 67 yo obese female with T2DM, CKD, HTN, HLD, in remission of renal cell carcinoma who presents to the clinic for 3 month follow up.   She is not checking her sugars regularly. She is taking metformin daily. She denies any hypoglycemic events. No open wounds. She denies any CP, palpitations, headaches or vision changes. She does not exercise but has an active job. She is compliant with medication.   She does have a mobile mass in her left knee. It is not tender unless touched. It has been there for a few months. Does not remember when she noticed it first. Denies any falls. Her left knee is generally stiff and achy but not terrible pain.   Active Ambulatory Problems    Diagnosis Date Noted   Insomnia 08/31/2014   Essential hypertension, benign 08/31/2014   Hyperlipidemia 09/28/2014   Bilateral low back pain without sciatica 04/17/2015   Primary gout 10/16/2015   Breast calcification, right 11/13/2015   Elevated testosterone level in female 03/25/2016   Mass of kidney 04/05/2016   Uterine mass 04/27/2016   Lymphangioma 06/18/2016   Hot flashes due to surgical menopause 08/19/2016   Obesity (BMI 30.0-34.9) 09/16/2016   Hypokalemia 04/02/2017   Anterior cervical adenopathy 05/19/2017   Trigger middle finger of left hand 12/01/2017   Type 2 diabetes mellitus with stage 3a chronic kidney disease, without long-term current use of insulin (HCC) 12/03/2017   Class 1 obesity due to excess calories without serious comorbidity with body mass index (BMI) of 33.0 to 33.9 in adult 12/03/2017   Elevated serum creatinine 12/07/2017   Irregular heart rate 04/06/2018   Hypotension 04/06/2018   Mid back pain on left side 07/29/2018   Personal history of gout 01/25/2019    Acute gout of right foot 01/25/2019   Elevated uric acid in blood 01/25/2019   Cubital tunnel syndrome, left 11/20/2019   Neoplasm of left kidney 11/30/2019   Bilateral plantar fasciitis 01/29/2020   neoplasm left kidney 01/29/2020   Hx of partial nephrectomy 01/29/2020   Stage 3b chronic kidney disease (HCC) 03/12/2020   DDD (degenerative disc disease), lumbar 11/12/2020   Acute pain of right knee 08/26/2021   Bilateral primary osteoarthritis of knee 08/27/2021   Idiopathic chronic gout of right knee without tophus 12/16/2021   Hyperlipidemia LDL goal <70 10/12/2022   Cyst of Bartholin's gland duct 04/03/2013   Pain in toe of right foot 12/08/2011   Pure hypercholesterolemia 04/28/2011   Routine health maintenance 03/03/2012   Caregiver stress 10/28/2022   Depressed mood 10/28/2022   Unresolved grief 10/28/2022   Chronic pain of left knee 03/23/2023   Knee mass, left 03/23/2023   Resolved Ambulatory Problems    Diagnosis Date Noted   Mass of omentum 04/05/2016   Hirsutism 04/05/2016   Mass of ovary 04/05/2016   Renal cell carcinoma (HCC) 08/18/2016   Past Medical History:  Diagnosis Date   Complication of anesthesia    Gout    Heart rate fast    Hypertension    PONV (postoperative nausea and vomiting)    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    Objective:     BP 124/64   Pulse 81   Ht 4\' 11"  (1.499 m)   Wt 160 lb (72.6 kg)   SpO2 99%   BMI 32.32 kg/m  BP Readings from Last 3 Encounters:  03/22/23 124/64  12/18/22 107/65  10/28/22 129/74   Wt Readings from Last 3 Encounters:  03/22/23 160 lb (72.6 kg)  12/18/22 159 lb (72.1 kg)  10/28/22 156 lb 1.3 oz (70.8 kg)      Physical Exam Constitutional:      Appearance: Normal appearance.  HENT:     Head: Normocephalic.  Cardiovascular:     Rate and Rhythm: Normal rate and  regular rhythm.     Heart sounds: Normal heart sounds.  Pulmonary:     Effort: Pulmonary effort is normal.     Breath sounds: Normal breath sounds.  Abdominal:     Tenderness: There is no right CVA tenderness or left CVA tenderness.  Musculoskeletal:     Right lower leg: No edema.     Left lower leg: No edema.     Comments: Left knee just over patella 3mm mobile mass that is tender to palpation. No redness, warmth, swelling over area. NROM of left knee.   Neurological:     General: No focal deficit present.     Mental Status: She is alert and oriented to person, place, and time.  Psychiatric:        Mood and Affect: Mood normal.      Results for orders placed or performed in visit on 03/22/23  POCT HgB A1C  Result Value Ref Range   Hemoglobin A1C 6.3 (A) 4.0 - 5.6 %   HbA1c POC (<> result, manual entry)     HbA1c, POC (prediabetic range)     HbA1c, POC (controlled diabetic range)    POCT UA - Microalbumin  Result Value Ref Range   Microalbumin Ur, POC 30 mg/L   Creatinine, POC 200 mg/dL   Albumin/Creatinine Ratio, Urine, POC <30      The 10-year ASCVD risk score (Arnett DK, et al., 2019) is: 15.6%    Assessment & Plan:  Marland KitchenMarland KitchenFaith was seen today for medical management of chronic issues.  Diagnoses and all orders for this visit:  Type 2 diabetes mellitus with stage 3a chronic kidney disease, without long-term current use of insulin (HCC) -     POCT HgB A1C -     POCT UA - Microalbumin -     atorvastatin (LIPITOR) 80 MG tablet; Take 1 tablet (80 mg total) by mouth daily. -     metFORMIN (GLUCOPHAGE-XR) 750 MG 24 hr tablet; Take 1 tablet (750 mg total) by mouth daily with breakfast. -     CMP14+EGFR -     TSH -     Magnesium -     CBC w/Diff/Platelet  Immunization due -     Flu vaccine HIGH DOSE PF (Fluzone High dose)  Essential hypertension, benign -     CMP14+EGFR -     TSH -     Magnesium -     CBC w/Diff/Platelet  Hyperlipidemia LDL goal <70 -      atorvastatin (LIPITOR) 80 MG tablet; Take 1 tablet (80 mg total) by mouth daily. -     CMP14+EGFR -     TSH -     Magnesium -     CBC w/Diff/Platelet  Chronic pain of left knee -     DG Knee  4 Views W/Patella Left; Future  Knee mass, left   A1C to goal Continue metformin BP to goal, on ARB Normal microalbumin Labs ordered to recheck kidney function On statin Foot and eye exam UTD Flu shot given today Covid booster declined Follow up in 3 months  Unclear etiology of mass of left kneecap will get xray    Return in about 3 months (around 06/21/2023).    Tandy Gaw, PA-C

## 2023-03-23 ENCOUNTER — Encounter: Payer: Self-pay | Admitting: Physician Assistant

## 2023-03-23 DIAGNOSIS — R2242 Localized swelling, mass and lump, left lower limb: Secondary | ICD-10-CM | POA: Insufficient documentation

## 2023-03-23 DIAGNOSIS — G8929 Other chronic pain: Secondary | ICD-10-CM | POA: Insufficient documentation

## 2023-03-23 LAB — CMP14+EGFR
ALT: 17 IU/L (ref 0–32)
AST: 20 IU/L (ref 0–40)
Albumin: 4.5 g/dL (ref 3.9–4.9)
Alkaline Phosphatase: 89 IU/L (ref 44–121)
BUN/Creatinine Ratio: 18 (ref 12–28)
BUN: 19 mg/dL (ref 8–27)
Bilirubin Total: 0.6 mg/dL (ref 0.0–1.2)
CO2: 19 mmol/L — ABNORMAL LOW (ref 20–29)
Calcium: 10.4 mg/dL — ABNORMAL HIGH (ref 8.7–10.3)
Chloride: 104 mmol/L (ref 96–106)
Creatinine, Ser: 1.08 mg/dL — ABNORMAL HIGH (ref 0.57–1.00)
Globulin, Total: 3 g/dL (ref 1.5–4.5)
Glucose: 74 mg/dL (ref 70–99)
Potassium: 4.4 mmol/L (ref 3.5–5.2)
Sodium: 141 mmol/L (ref 134–144)
Total Protein: 7.5 g/dL (ref 6.0–8.5)
eGFR: 57 mL/min/{1.73_m2} — ABNORMAL LOW (ref >59)

## 2023-03-23 LAB — CBC WITH DIFFERENTIAL/PLATELET
Basophils Absolute: 0.1 10*3/uL (ref 0.0–0.2)
Basos: 1 %
EOS (ABSOLUTE): 0.1 10*3/uL (ref 0.0–0.4)
Eos: 2 %
Hematocrit: 38.3 % (ref 34.0–46.6)
Hemoglobin: 13.1 g/dL (ref 11.1–15.9)
Immature Grans (Abs): 0 10*3/uL (ref 0.0–0.1)
Immature Granulocytes: 1 %
Lymphocytes Absolute: 2.7 10*3/uL (ref 0.7–3.1)
Lymphs: 46 %
MCH: 27.2 pg (ref 26.6–33.0)
MCHC: 34.2 g/dL (ref 31.5–35.7)
MCV: 80 fL (ref 79–97)
Monocytes Absolute: 0.4 10*3/uL (ref 0.1–0.9)
Monocytes: 7 %
Neutrophils Absolute: 2.5 10*3/uL (ref 1.4–7.0)
Neutrophils: 43 %
Platelets: 337 10*3/uL (ref 150–450)
RBC: 4.81 x10E6/uL (ref 3.77–5.28)
RDW: 14.2 % (ref 11.7–15.4)
WBC: 5.7 10*3/uL (ref 3.4–10.8)

## 2023-03-23 LAB — TSH: TSH: 0.468 u[IU]/mL (ref 0.450–4.500)

## 2023-03-23 LAB — MAGNESIUM: Magnesium: 1.8 mg/dL (ref 1.6–2.3)

## 2023-03-23 NOTE — Progress Notes (Signed)
Leyan,   Magnesium is on the low side 400mg  of magnesium at bedtime.  Thyroid looks good.  In the last 5 months GFR, kidney function, has dropped quite a bit. Are you taking any anti-inflammatories OTC? Are you staying hydrated? Recheck in 1 month

## 2023-04-11 NOTE — Progress Notes (Signed)
Arthritis in medial knee.

## 2023-04-30 DIAGNOSIS — Z7984 Long term (current) use of oral hypoglycemic drugs: Secondary | ICD-10-CM | POA: Diagnosis not present

## 2023-04-30 DIAGNOSIS — H2513 Age-related nuclear cataract, bilateral: Secondary | ICD-10-CM | POA: Diagnosis not present

## 2023-04-30 DIAGNOSIS — E119 Type 2 diabetes mellitus without complications: Secondary | ICD-10-CM | POA: Diagnosis not present

## 2023-04-30 LAB — HM DIABETES EYE EXAM

## 2023-05-08 ENCOUNTER — Other Ambulatory Visit: Payer: Self-pay | Admitting: Physician Assistant

## 2023-05-08 DIAGNOSIS — F4321 Adjustment disorder with depressed mood: Secondary | ICD-10-CM

## 2023-05-08 DIAGNOSIS — Z636 Dependent relative needing care at home: Secondary | ICD-10-CM

## 2023-05-08 DIAGNOSIS — R4589 Other symptoms and signs involving emotional state: Secondary | ICD-10-CM

## 2023-05-09 ENCOUNTER — Other Ambulatory Visit: Payer: Self-pay | Admitting: Physician Assistant

## 2023-05-09 DIAGNOSIS — E1122 Type 2 diabetes mellitus with diabetic chronic kidney disease: Secondary | ICD-10-CM

## 2023-05-09 DIAGNOSIS — E785 Hyperlipidemia, unspecified: Secondary | ICD-10-CM

## 2023-05-20 ENCOUNTER — Telehealth: Payer: Self-pay | Admitting: Physician Assistant

## 2023-05-20 NOTE — Telephone Encounter (Signed)
Patient called she is requesting a Cola Guard Screening test does she need to come in or can you order it?  Please Advise

## 2023-05-21 ENCOUNTER — Other Ambulatory Visit: Payer: Self-pay

## 2023-05-21 ENCOUNTER — Other Ambulatory Visit: Payer: Self-pay | Admitting: Physician Assistant

## 2023-05-21 ENCOUNTER — Telehealth: Payer: Self-pay

## 2023-05-21 DIAGNOSIS — I10 Essential (primary) hypertension: Secondary | ICD-10-CM

## 2023-05-21 DIAGNOSIS — Z1211 Encounter for screening for malignant neoplasm of colon: Secondary | ICD-10-CM

## 2023-05-21 NOTE — Telephone Encounter (Signed)
Copied from CRM [id number]. Topic: Appointments - Appointment Info/Confirmation >> May 21, 2023  2:43 PM Hector Shade B wrote: Reason for CRM: Patient needs to speak to the office to see if its necessary for her to come in Monday at 2:40pm for col. Guard or does she just have to come pick it up.  [phone]

## 2023-05-21 NOTE — Telephone Encounter (Signed)
Task completed. Patient was notified of Cologuard procedures and expectations. During the conversation, the patient mentioned she had to repeat her labs. New order placed. No other inquiries during the call.

## 2023-05-21 NOTE — Telephone Encounter (Signed)
Ordered placed! It should mail out to your house!

## 2023-05-24 ENCOUNTER — Other Ambulatory Visit: Payer: Self-pay | Admitting: Physician Assistant

## 2023-05-24 ENCOUNTER — Other Ambulatory Visit: Payer: Self-pay | Admitting: Family Medicine

## 2023-05-24 DIAGNOSIS — I10 Essential (primary) hypertension: Secondary | ICD-10-CM | POA: Diagnosis not present

## 2023-05-24 DIAGNOSIS — Z1231 Encounter for screening mammogram for malignant neoplasm of breast: Secondary | ICD-10-CM

## 2023-05-25 LAB — CMP14+EGFR
ALT: 15 [IU]/L (ref 0–32)
AST: 16 [IU]/L (ref 0–40)
Albumin: 4.3 g/dL (ref 3.9–4.9)
Alkaline Phosphatase: 91 [IU]/L (ref 44–121)
BUN/Creatinine Ratio: 19 (ref 12–28)
BUN: 18 mg/dL (ref 8–27)
Bilirubin Total: 0.6 mg/dL (ref 0.0–1.2)
CO2: 22 mmol/L (ref 20–29)
Calcium: 9.5 mg/dL (ref 8.7–10.3)
Chloride: 103 mmol/L (ref 96–106)
Creatinine, Ser: 0.96 mg/dL (ref 0.57–1.00)
Globulin, Total: 2.5 g/dL (ref 1.5–4.5)
Glucose: 73 mg/dL (ref 70–99)
Potassium: 4.1 mmol/L (ref 3.5–5.2)
Sodium: 141 mmol/L (ref 134–144)
Total Protein: 6.8 g/dL (ref 6.0–8.5)
eGFR: 66 mL/min/{1.73_m2} (ref >59)

## 2023-05-25 NOTE — Progress Notes (Signed)
Your lab work is within acceptable range and there are no concerning findings.   ?

## 2023-06-01 DIAGNOSIS — Z1211 Encounter for screening for malignant neoplasm of colon: Secondary | ICD-10-CM | POA: Diagnosis not present

## 2023-06-08 LAB — COLOGUARD: COLOGUARD: NEGATIVE

## 2023-06-08 NOTE — Progress Notes (Signed)
Negative cologuard. Follow up screening in 3 years.

## 2023-06-16 ENCOUNTER — Other Ambulatory Visit: Payer: Self-pay | Admitting: Physician Assistant

## 2023-06-16 DIAGNOSIS — I10 Essential (primary) hypertension: Secondary | ICD-10-CM

## 2023-06-17 ENCOUNTER — Other Ambulatory Visit: Payer: Self-pay | Admitting: Physician Assistant

## 2023-06-17 DIAGNOSIS — E119 Type 2 diabetes mellitus without complications: Secondary | ICD-10-CM

## 2023-06-19 ENCOUNTER — Ambulatory Visit
Admission: EM | Admit: 2023-06-19 | Discharge: 2023-06-19 | Disposition: A | Discharge status: Home / Self Care | Payer: 59 | Attending: Family Medicine | Admitting: Family Medicine

## 2023-06-19 ENCOUNTER — Other Ambulatory Visit: Payer: Self-pay

## 2023-06-19 DIAGNOSIS — J209 Acute bronchitis, unspecified: Secondary | ICD-10-CM

## 2023-06-19 MED ORDER — PREDNISONE 20 MG PO TABS: 20.0000 mg | ORAL_TABLET | Freq: Two times a day (BID) | ORAL | 0 refills | Status: DC

## 2023-06-19 MED ORDER — AMOXICILLIN-POT CLAVULANATE 875-125 MG PO TABS: 1.0000 | ORAL_TABLET | Freq: Two times a day (BID) | ORAL | 0 refills | Status: DC

## 2023-06-19 NOTE — Discharge Instructions (Signed)
Make sure that you are drinking lots of fluids Take the Augmentin antibiotic 2 times a day Consider taking a probiotic while on Augmentin to protect your stomach Take the prednisone twice a day for 5 days See your doctor if not improving by next week

## 2023-06-19 NOTE — ED Provider Notes (Signed)
[facility]    CSN: [id number] Arrival date & time: 06/19/23  0902      History   Chief Complaint Chief Complaint  Patient presents with   Cough    HPI Bonnie Torres is a 67 y.o. female.   Patient has had a harsh cough that she states has been going on for over a week.  She is now on day 8.  She is feeling very tired.  She is coughing up green and brown sputum.  No runny or stuffy nose, no congestion.  No headache or bodyaches.  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   Type 2 diabetes mellitus without complication, without long-term current use of insulin (HCC) 12/03/2017    Patient Active Problem List   Diagnosis Date Noted   Chronic pain of left knee 03/23/2023   Knee mass, left 03/23/2023   Caregiver stress 10/28/2022   Depressed mood 10/28/2022   Unresolved grief 10/28/2022   Hyperlipidemia LDL goal <70 10/12/2022   Idiopathic chronic gout of right knee without tophus 12/16/2021   Bilateral primary osteoarthritis of knee 08/27/2021   DDD (degenerative disc disease), lumbar 11/12/2020   Stage 3b chronic kidney disease (HCC) 03/12/2020   Bilateral plantar fasciitis 01/29/2020   neoplasm left kidney 01/29/2020   Hx of partial nephrectomy 01/29/2020   Cubital tunnel syndrome, left 11/20/2019   Irregular heart rate 04/06/2018   Type 2 diabetes mellitus with stage 3a chronic kidney disease, without long-term current use of insulin (HCC) 12/03/2017   Class 1 obesity due to excess calories without serious comorbidity with body mass index (BMI) of 33.0 to 33.9 in adult 12/03/2017    Trigger middle finger of left hand 12/01/2017   Lymphangioma 06/18/2016   Uterine mass 04/27/2016   Mass of kidney 04/05/2016   Elevated testosterone level in female 03/25/2016   Breast calcification, right 11/13/2015   Primary gout 10/16/2015   Insomnia 08/31/2014   Essential hypertension, benign 08/31/2014   Cyst of Bartholin's gland duct 04/03/2013   Routine health maintenance 03/03/2012   Pure hypercholesterolemia 04/28/2011    Past Surgical History:  Procedure Laterality Date   ABDOMINAL HYSTERECTOMY N/A 06/18/2016   Procedure: HYSTERECTOMY ABDOMINAL TOTAL;  Surgeon: Adolphus Birchwood, MD;  Location: [facility];  Service: Gynecology;  Laterality: N/A;   BREAST EXCISIONAL BIOPSY Right    BREAST SURGERY Right    breast biopsy-benign   CHOLECYSTECTOMY     MASS EXCISION N/A 06/18/2016   Procedure: RESECTION RETROPERITONEAL MASS WITH RETORPERITONEAL EXPLORATION ,LIGATION CHYLI CISTERNA,OPEN  CHOLECYSTECTOMY WITH MOBILATION HEPATIC FLEXURE;  Surgeon: Forde Dandy, MD;  Location: [facility];  Service: General;  Laterality: N/A;   PARTIAL NEPHRECTOMY Right 06/18/2016   Procedure: OPEN RIGHT NEPHRECTOMY PARTIAL;  Surgeon: Heloise Purpura, MD;  Location: [facility];  Service: Urology;  Laterality: Right;   ROBOTIC ASSITED PARTIAL NEPHRECTOMY Left 11/30/2019   Procedure: ATTEMPTED XI ROBOTIC ASSITED LAPAROSCOPIC CONVERTED TO OPEN PARTIAL NEPHRECTOMY;  Surgeon: Heloise Purpura, MD;  Location: [facility];  Service: Urology;  Laterality: Left;   SALPINGOOPHORECTOMY Bilateral 06/18/2016  Procedure: BILATERAL SALPINGO OOPHORECTOMY;  Surgeon: Adolphus Birchwood, MD;  Location: [facility];  Service: Gynecology;  Laterality: Bilateral;   SHOULDER SURGERY Right 2010   TOE SURGERY Right Hammer Toe   2004   TONSILLECTOMY      OB History   No obstetric history on file.      Home Medications    Prior to Admission medications   Medication Sig Start Date End Date Taking? Authorizing Provider  amoxicillin-clavulanate  (AUGMENTIN) 875-125 MG tablet Take 1 tablet by mouth every 12 (twelve) hours. 06/19/23  Yes Eustace Moore, MD  predniSONE (DELTASONE) 20 MG tablet Take 1 tablet (20 mg total) by mouth 2 (two) times daily with a meal. 06/19/23  Yes Eustace Moore, MD  atorvastatin (LIPITOR) 80 MG tablet TAKE 1 TABLET BY MOUTH DAILY 05/10/23   Breeback, Jade L, PA-C  Blood Glucose Monitoring Suppl (ONETOUCH VERIO FLEX SYSTEM) w/Device KIT Check fasting blood sugar every morning. DM ICD10 E11.9 01/14/18   Tandy Gaw L, PA-C  clindamycin (CLINDAGEL) 1 % gel Apply topically. 12/22/21   [provider]  diclofenac Sodium (VOLTAREN) 1 % GEL APPLY 4 GRAMS TOPICALLY 4 TIMES  DAILY TO LOWER EXTREMITY JOINT.  MANUFACTURER RECOMMENDS NOT  EXCEEDING 32 GRAMS/DAY 12/18/22   Breeback, Jade L, PA-C  hydroquinone 4 % cream Apply topically 2 (two) times daily. 07/12/21   [provider]  Lancets (ONETOUCH DELICA PLUS [place]) MISC CHECK BLOOD SUGAR TWICE DAILY 06/17/23   Jomarie Longs, PA-C  metFORMIN (GLUCOPHAGE-XR) 750 MG 24 hr tablet Take 1 tablet (750 mg total) by mouth daily with breakfast. 03/22/23   Caleen Essex, Jade L, PA-C  metoprolol succinate (TOPROL-XL) 100 MG 24 hr tablet TAKE 1 TABLET BY MOUTH DAILY 06/17/23   Breeback, Jade L, PA-C  Olmesartan-amLODIPine-HCTZ 20-5-12.5 MG TABS TAKE 1 TABLET BY MOUTH DAILY 06/17/23   Breeback, Jade L, PA-C  ONETOUCH VERIO test strip CHECK BLOOD GLUCOSE TWICE DAILY  AS DIRECTED AS NEEDED 03/24/23   Breeback, Jade L, PA-C  potassium chloride SA (KLOR-CON M) 20 MEQ tablet Take 1 tablet (20 mEq total) by mouth daily. 12/18/22   Breeback, Lonna Cobb, PA-C  spironolactone (ALDACTONE) 25 MG tablet TAKE 1 TABLET BY MOUTH TWICE  DAILY 11/03/22   Breeback, Jade L, PA-C  tretinoin (RETIN-A) 0.05 % cream Apply 1 application topically at bedtime.     [provider]  triamcinolone cream (KENALOG) 0.1 % Apply 1 Application topically 2 (two) times daily. 01/13/22   Breeback,  Jade L, PA-C  zolpidem (AMBIEN) 10 MG tablet TAKE 1/2 TO 1 TABLET(5 TO 10 MG) BY MOUTH AT BEDTIME 03/18/23   Breeback, Lonna Cobb, PA-C    Family History Family History  Problem Relation Age of Onset   Heart attack Paternal Aunt    Hypertension Mother    Diabetes Maternal Aunt    Cancer Paternal Aunt        stomach   Cancer Sister    Breast cancer Other     Social History Social History   Tobacco Use   Smoking status: Never   Smokeless tobacco: Never  Vaping Use   Vaping status: Never Used  Substance Use Topics   Alcohol use: No    Alcohol/week: 0.0 standard drinks of alcohol   Drug use: No     Allergies   Patient has no known allergies.   Review of Systems Review of Systems See HPI  Physical Exam Triage Vital Signs ED Triage Vitals  Encounter Vitals  Group     BP 06/19/23 0917 137/79     Systolic BP Percentile --      Diastolic BP Percentile --      Pulse Rate 06/19/23 0917 80     Resp 06/19/23 0917 16     Temp 06/19/23 0917 98.5 F (36.9 C)     Temp Source 06/19/23 0917 Oral     SpO2 06/19/23 0917 99 %     Weight --      Height --      Head Circumference --      Peak Flow --      Pain Score 06/19/23 0921 5     Pain Loc --      Pain Education --      Exclude from Growth Chart --    No data found.  Updated Vital Signs BP 137/79   Pulse 80   Temp 98.5 F (36.9 C) (Oral)   Resp 16   SpO2 99%  Physical Exam Constitutional:      General: She is not in acute distress.    Appearance: She is well-developed.  HENT:     Head: Normocephalic and atraumatic.     Right Ear: Tympanic membrane normal.     Left Ear: Tympanic membrane normal.     Nose: Nose normal. No congestion.     Mouth/Throat:     Mouth: Mucous membranes are moist.     Pharynx: No posterior oropharyngeal erythema.  Eyes:     Conjunctiva/sclera: Conjunctivae normal.     Pupils: Pupils are equal, round, and reactive to light.  Cardiovascular:     Rate and Rhythm: Normal rate.      Heart sounds: Normal heart sounds.  Pulmonary:     Effort: Pulmonary effort is normal. No respiratory distress.     Breath sounds: Rhonchi present.     Comments: Bronchial breath sounds, few rhonchi Abdominal:     General: There is no distension.     Palpations: Abdomen is soft.  Musculoskeletal:        General: Normal range of motion.     Cervical back: Normal range of motion.  Skin:    General: Skin is warm and dry.  Neurological:     Mental Status: She is alert.      UC Treatments / Results  Labs (all labs ordered are listed, but only abnormal results are displayed) Labs Reviewed - No data to display  EKG   Radiology No results found.  Procedures Procedures (including critical care time)  Medications Ordered in UC Medications - No data to display  Initial Impression / Assessment and Plan / UC Course  I have reviewed the triage vital signs and the nursing notes.  Pertinent labs & imaging results that were available during my care of the patient were reviewed by me and considered in my medical decision making (see chart for details).     Final Clinical Impressions(s) / UC Diagnoses   Final diagnoses:  Acute bronchitis, unspecified organism     Discharge Instructions      Make sure that you are drinking lots of fluids Take the Augmentin antibiotic 2 times a day Consider taking a probiotic while on Augmentin to protect your stomach Take the prednisone twice a day for 5 days See your doctor if not improving by next week   ED Prescriptions     Medication Sig Dispense Auth. Provider   amoxicillin-clavulanate (AUGMENTIN) 875-125 MG tablet Take 1 tablet by mouth every  12 (twelve) hours. 14 tablet Eustace Moore, MD   predniSONE (DELTASONE) 20 MG tablet Take 1 tablet (20 mg total) by mouth 2 (two) times daily with a meal. 10 tablet Eustace Moore, MD      PDMP not reviewed this encounter.   Eustace Moore, MD 06/19/23 [phone]

## 2023-06-19 NOTE — ED Triage Notes (Signed)
Deep cough since last Friday. Cough prod of green sputum, sometimes brown. No fever. Had body aches initially but not any longer. Has been taking coricidin cough medication.

## 2023-06-21 ENCOUNTER — Ambulatory Visit (INDEPENDENT_AMBULATORY_CARE_PROVIDER_SITE_OTHER): Payer: 59 | Admitting: Physician Assistant

## 2023-06-21 ENCOUNTER — Ambulatory Visit: Payer: 59 | Admitting: Physician Assistant

## 2023-06-21 VITALS — BP 138/88 | HR 71 | Resp 12 | Ht 59.0 in | Wt 157.3 lb

## 2023-06-21 DIAGNOSIS — N1831 Chronic kidney disease, stage 3a: Secondary | ICD-10-CM

## 2023-06-21 DIAGNOSIS — I10 Essential (primary) hypertension: Secondary | ICD-10-CM

## 2023-06-21 DIAGNOSIS — Z7984 Long term (current) use of oral hypoglycemic drugs: Secondary | ICD-10-CM

## 2023-06-21 DIAGNOSIS — E1122 Type 2 diabetes mellitus with diabetic chronic kidney disease: Secondary | ICD-10-CM

## 2023-06-21 DIAGNOSIS — E876 Hypokalemia: Secondary | ICD-10-CM | POA: Diagnosis not present

## 2023-06-21 DIAGNOSIS — E785 Hyperlipidemia, unspecified: Secondary | ICD-10-CM

## 2023-06-21 LAB — POCT GLYCOSYLATED HEMOGLOBIN (HGB A1C): Hemoglobin A1C: 6 % — AB (ref 4.0–5.6)

## 2023-06-21 MED ORDER — SPIRONOLACTONE 25 MG PO TABS: 25.0000 mg | ORAL_TABLET | Freq: Two times a day (BID) | ORAL | 2 refills | Status: DC

## 2023-06-21 MED ORDER — POTASSIUM CHLORIDE CRYS ER 20 MEQ PO TBCR: 20.0000 meq | EXTENDED_RELEASE_TABLET | Freq: Every day | ORAL | 2 refills | Status: DC

## 2023-06-21 NOTE — Progress Notes (Signed)
Established Patient Office Visit  Subjective   Patient ID: Bonnie Torres, female    DOB: 18-Mar-1956  Age: 67 y.o. MRN: [id number]  Chief Complaint  Patient presents with   Diabetes    HPI Pt is a 67 yo female with T2DM, HTN, HLD who presents to the clinic for 3 month follow up. She is doing ok. She just saw UC this weekend for bronchitis and on augmentin and steroid. She is feeling a little better. Continues to have cough.   She is compliant with medications. Denies any hypoglycemic events. No CP, palpitations, headaches or vision changes. Not checking sugars.   .. Active Ambulatory Problems    Diagnosis Date Noted   Insomnia 08/31/2014   Essential hypertension, benign 08/31/2014   Primary gout 10/16/2015   Breast calcification, right 11/13/2015   Elevated testosterone level in female 03/25/2016   Mass of kidney 04/05/2016   Uterine mass 04/27/2016   Lymphangioma 06/18/2016   Hypokalemia 04/02/2017   Trigger middle finger of left hand 12/01/2017   Type 2 diabetes mellitus with stage 3a chronic kidney disease, without long-term current use of insulin (HCC) 12/03/2017   Class 1 obesity due to excess calories without serious comorbidity with body mass index (BMI) of 33.0 to 33.9 in adult 12/03/2017   Irregular heart rate 04/06/2018   Cubital tunnel syndrome, left 11/20/2019   Bilateral plantar fasciitis 01/29/2020   neoplasm left kidney 01/29/2020   Hx of partial nephrectomy 01/29/2020   Stage 3b chronic kidney disease (HCC) 03/12/2020   DDD (degenerative disc disease), lumbar 11/12/2020   Bilateral primary osteoarthritis of knee 08/27/2021   Idiopathic chronic gout of right knee without tophus 12/16/2021   Hyperlipidemia LDL goal <70 10/12/2022   Cyst of Bartholin's gland duct 04/03/2013   Pure hypercholesterolemia 04/28/2011   Routine health maintenance 03/03/2012   Caregiver stress 10/28/2022   Depressed mood 10/28/2022   Unresolved grief 10/28/2022   Chronic pain of  left knee 03/23/2023   Knee mass, left 03/23/2023   Resolved Ambulatory Problems    Diagnosis Date Noted   Hyperlipidemia 09/28/2014   Bilateral low back pain without sciatica 04/17/2015   Mass of omentum 04/05/2016   Hirsutism 04/05/2016   Mass of ovary 04/05/2016   Renal cell carcinoma (HCC) 08/18/2016   Hot flashes due to surgical menopause 08/19/2016   Obesity (BMI 30.0-34.9) 09/16/2016   Anterior cervical adenopathy 05/19/2017   Elevated serum creatinine 12/07/2017   Hypotension 04/06/2018   Mid back pain on left side 07/29/2018   Personal history of gout 01/25/2019   Acute gout of right foot 01/25/2019   Elevated uric acid in blood 01/25/2019   Neoplasm of left kidney 11/30/2019   Acute pain of right knee 08/26/2021   Pain in toe of right foot 12/08/2011   Past Medical History:  Diagnosis Date   Complication of anesthesia    Gout    Heart rate fast    Hypertension    PONV (postoperative nausea and vomiting)    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    Objective:     BP 138/88   Pulse 71   Resp 12   Ht 4\' 11"  (1.499 m)   Wt 157 lb 4.8 oz (71.4 kg)   SpO2 99%   BMI 31.77 kg/m  BP Readings from Last 3 Encounters:  06/21/23 138/88  06/19/23 137/79  03/22/23 124/64   Wt Readings from Last 3 Encounters:  06/21/23 157 lb 4.8 oz (71.4 kg)  03/22/23 160 lb (72.6 kg)  12/18/22 159 lb (72.1 kg)      Physical Exam Constitutional:      Appearance: Normal appearance.  HENT:     Head: Normocephalic.     Right Ear: Tympanic membrane, ear canal and external ear normal. There is no impacted cerumen.     Left Ear: Tympanic membrane, ear canal and external ear normal. There is no impacted cerumen.  Cardiovascular:     Rate and Rhythm: Normal rate and regular rhythm.  Pulmonary:     Effort: Pulmonary effort is normal.     Breath sounds:  Normal breath sounds.  Musculoskeletal:     Cervical back: Neck supple.     Right lower leg: No edema.     Left lower leg: No edema.  Lymphadenopathy:     Cervical: No cervical adenopathy.  Neurological:     General: No focal deficit present.     Mental Status: She is alert.  Psychiatric:        Mood and Affect: Mood normal.      Results for orders placed or performed in visit on 06/21/23  POCT HgB A1C  Result Value Ref Range   Hemoglobin A1C 6.0 (A) 4.0 - 5.6 %   HbA1c POC (<> result, manual entry)     HbA1c, POC (prediabetic range)     HbA1c, POC (controlled diabetic range)        The 10-year ASCVD risk score (Arnett DK, et al., 2019) is: 19.8%    Assessment & Plan:  Marland KitchenMarland KitchenFaith was seen today for diabetes.  Diagnoses and all orders for this visit:  Type 2 diabetes mellitus with stage 3a chronic kidney disease, without long-term current use of insulin (HCC) -     POCT HgB A1C  Hypokalemia -     spironolactone (ALDACTONE) 25 MG tablet; Take 1 tablet (25 mg total) by mouth 2 (two) times daily. -     potassium chloride SA (KLOR-CON M) 20 MEQ tablet; Take 1 tablet (20 mEq total) by mouth daily.  Essential hypertension, benign -     spironolactone (ALDACTONE) 25 MG tablet; Take 1 tablet (25 mg total) by mouth 2 (two) times daily.  Hyperlipidemia LDL goal <70   A1C to goal Continue same medications Need eye exam On statin, labs UTD BP not to goal, on prednisone and did not take medication this morning yet Last covid booster 09/2022 Follow up in 3 months   Return in about 3 months (around 09/19/2023).    Tandy Gaw, PA-C

## 2023-07-12 ENCOUNTER — Other Ambulatory Visit: Payer: Self-pay | Admitting: Physician Assistant

## 2023-07-12 DIAGNOSIS — E876 Hypokalemia: Secondary | ICD-10-CM

## 2023-07-12 DIAGNOSIS — I10 Essential (primary) hypertension: Secondary | ICD-10-CM

## 2023-07-14 ENCOUNTER — Ambulatory Visit: Payer: 59

## 2023-07-14 DIAGNOSIS — Z1231 Encounter for screening mammogram for malignant neoplasm of breast: Secondary | ICD-10-CM | POA: Diagnosis not present

## 2023-07-16 ENCOUNTER — Other Ambulatory Visit: Payer: Self-pay | Admitting: Physician Assistant

## 2023-07-16 ENCOUNTER — Encounter: Payer: Self-pay | Admitting: Physician Assistant

## 2023-07-16 DIAGNOSIS — R928 Other abnormal and inconclusive findings on diagnostic imaging of breast: Secondary | ICD-10-CM

## 2023-07-16 DIAGNOSIS — N6310 Unspecified lump in the right breast, unspecified quadrant: Secondary | ICD-10-CM | POA: Insufficient documentation

## 2023-07-16 NOTE — Progress Notes (Signed)
Bonnie Torres,   Mammogram showed a possible right breast mass. Imaging will contact you to get more images to get a better look.   [name]

## 2023-07-29 ENCOUNTER — Ambulatory Visit
Admission: RE | Admit: 2023-07-29 | Discharge: 2023-07-29 | Disposition: A | Discharge status: Home / Self Care | Payer: 59 | Source: Ambulatory Visit | Attending: Physician Assistant | Admitting: Physician Assistant

## 2023-07-29 ENCOUNTER — Other Ambulatory Visit: Payer: Self-pay | Admitting: Physician Assistant

## 2023-07-29 DIAGNOSIS — N6314 Unspecified lump in the right breast, lower inner quadrant: Secondary | ICD-10-CM | POA: Diagnosis not present

## 2023-07-29 DIAGNOSIS — R928 Other abnormal and inconclusive findings on diagnostic imaging of breast: Secondary | ICD-10-CM

## 2023-07-29 DIAGNOSIS — R921 Mammographic calcification found on diagnostic imaging of breast: Secondary | ICD-10-CM | POA: Diagnosis not present

## 2023-07-29 DIAGNOSIS — N6310 Unspecified lump in the right breast, unspecified quadrant: Secondary | ICD-10-CM

## 2023-08-02 ENCOUNTER — Ambulatory Visit
Admission: RE | Admit: 2023-08-02 | Discharge: 2023-08-02 | Disposition: A | Discharge status: Home / Self Care | Payer: 59 | Source: Ambulatory Visit | Attending: Physician Assistant | Admitting: Physician Assistant

## 2023-08-02 DIAGNOSIS — R921 Mammographic calcification found on diagnostic imaging of breast: Secondary | ICD-10-CM | POA: Diagnosis not present

## 2023-08-02 DIAGNOSIS — C50311 Malignant neoplasm of lower-inner quadrant of right female breast: Secondary | ICD-10-CM | POA: Diagnosis not present

## 2023-08-02 DIAGNOSIS — Z170 Estrogen receptor positive status [ER+]: Secondary | ICD-10-CM | POA: Diagnosis not present

## 2023-08-02 DIAGNOSIS — N6314 Unspecified lump in the right breast, lower inner quadrant: Secondary | ICD-10-CM | POA: Diagnosis not present

## 2023-08-02 DIAGNOSIS — N6310 Unspecified lump in the right breast, unspecified quadrant: Secondary | ICD-10-CM

## 2023-08-02 HISTORY — PX: BREAST BIOPSY: SHX20

## 2023-08-03 ENCOUNTER — Ambulatory Visit (INDEPENDENT_AMBULATORY_CARE_PROVIDER_SITE_OTHER): Payer: 59

## 2023-08-03 VITALS — BP 125/79 | HR 76 | Ht <= 58 in | Wt 156.0 lb

## 2023-08-03 DIAGNOSIS — Z0000 Encounter for general adult medical examination without abnormal findings: Secondary | ICD-10-CM

## 2023-08-03 LAB — SURGICAL PATHOLOGY

## 2023-08-03 NOTE — Progress Notes (Signed)
 Subjective:   Bonnie Torres is a 68 y.o. female who presents for Medicare Annual (Subsequent) preventive examination.  Visit Complete: In person  Patient Medicare AWV questionnaire was completed by the patient on 08/02/2023; I have confirmed that all information answered by patient is correct and no changes since this date.  Cardiac Risk Factors include: advanced age (>7men, >21 women);diabetes mellitus;hypertension;obesity (BMI >30kg/m2)     Objective:    Today's Vitals   08/03/23 1500  BP: 125/79  Pulse: 76  SpO2: 98%  Weight: 156 lb (70.8 kg)  Height: 4' 10 (1.473 m)   Body mass index is 32.6 kg/m.     08/03/2023    3:15 PM 07/31/2022    3:04 PM 07/21/2021    3:03 PM 04/18/2020    2:41 PM 11/30/2019    4:34 PM 11/20/2019   11:08 AM 04/25/2019    3:54 PM  Advanced Directives  Does Patient Have a Medical Advance Directive? No No No No No No No  Would patient like information on creating a medical advance directive? No - Patient declined No - Patient declined No - Patient declined Yes (MAU/Ambulatory/Procedural Areas - Information given) No - Patient declined No - Patient declined No - Patient declined    Current Medications (verified) Outpatient Encounter Medications as of 08/03/2023  Medication Sig   atorvastatin  (LIPITOR) 80 MG tablet TAKE 1 TABLET BY MOUTH DAILY   Blood Glucose Monitoring Suppl (ONETOUCH VERIO FLEX SYSTEM) w/Device KIT Check fasting blood sugar every morning. DM ICD10 E11.9   diclofenac  Sodium (VOLTAREN ) 1 % GEL APPLY 4 GRAMS TOPICALLY 4 TIMES  DAILY TO LOWER EXTREMITY JOINT.  MANUFACTURER RECOMMENDS NOT  EXCEEDING 32 GRAMS/DAY   metFORMIN  (GLUCOPHAGE -XR) 750 MG 24 hr tablet Take 1 tablet (750 mg total) by mouth daily with breakfast.   metoprolol  succinate (TOPROL -XL) 100 MG 24 hr tablet TAKE 1 TABLET BY MOUTH DAILY   Olmesartan -amLODIPine -HCTZ 20-5-12.5 MG TABS TAKE 1 TABLET BY MOUTH DAILY   ONETOUCH VERIO test strip CHECK BLOOD GLUCOSE TWICE DAILY  AS  DIRECTED AS NEEDED   potassium chloride  SA (KLOR-CON  M) 20 MEQ tablet Take 1 tablet (20 mEq total) by mouth daily.   tretinoin (RETIN-A) 0.1 % cream Apply topically daily.   zolpidem  (AMBIEN ) 10 MG tablet TAKE 1/2 TO 1 TABLET(5 TO 10 MG) BY MOUTH AT BEDTIME   clindamycin (CLINDAGEL) 1 % gel Apply topically. (Patient not taking: Reported on 08/03/2023)   hydroquinone 4 % cream Apply topically 2 (two) times daily. (Patient not taking: Reported on 08/03/2023)   Lancets (ONETOUCH DELICA PLUS LANCET33G) MISC CHECK BLOOD SUGAR TWICE DAILY (Patient not taking: Reported on 08/03/2023)   spironolactone  (ALDACTONE ) 25 MG tablet TAKE 1 TABLET BY MOUTH TWICE  DAILY (Patient not taking: Reported on 08/03/2023)   triamcinolone  cream (KENALOG ) 0.1 % Apply 1 Application topically 2 (two) times daily. (Patient not taking: Reported on 08/03/2023)   [DISCONTINUED] amoxicillin -clavulanate (AUGMENTIN ) 875-125 MG tablet Take 1 tablet by mouth every 12 (twelve) hours.   [DISCONTINUED] predniSONE  (DELTASONE ) 20 MG tablet Take 1 tablet (20 mg total) by mouth 2 (two) times daily with a meal.   [DISCONTINUED] tretinoin (RETIN-A) 0.05 % cream Apply 1 application topically at bedtime.    No facility-administered encounter medications on file as of 08/03/2023.    Allergies (verified) Patient has no known allergies.   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   Type 2 diabetes mellitus without complication, without long-term current use of insulin  (HCC) 12/03/2017   Past Surgical History:  Procedure Laterality Date   ABDOMINAL HYSTERECTOMY N/A 06/18/2016   Procedure: HYSTERECTOMY ABDOMINAL TOTAL;  Surgeon: Maurilio Ship, MD;  Location: [facility];  Service: Gynecology;  Laterality: N/A;   BREAST BIOPSY Right  08/02/2023   US  RT BREAST BX W LOC DEV 1ST LESION IMG BX SPEC US  GUIDE 08/02/2023 [facility] M[facility]MO[facility]APHY   BREAST EXCISIONAL BIOPSY Right    BREAST SURGERY Right    breast biopsy-benign   CHOLECYSTECTOMY     MASS EXCISION N/A 06/18/2016   Procedure: RESECTION RETROPERITONEAL MASS WITH RETORPERITONEAL EXPLORATION ,LIGATION CHYLI CISTERNA,OPEN  CHOLECYSTECTOMY WITH MOBILATION HEPATIC FLEXURE;  Surgeon: Zada JAYSON Hoard, MD;  Location: [facility];  Service: General;  Laterality: N/A;   PARTIAL NEPHRECTOMY Right 06/18/2016   Procedure: OPEN RIGHT NEPHRECTOMY PARTIAL;  Surgeon: Gretel Ferrara, MD;  Location: [facility];  Service: Urology;  Laterality: Right;   ROBOTIC ASSITED PARTIAL NEPHRECTOMY Left 11/30/2019   Procedure: ATTEMPTED XI ROBOTIC ASSITED LAPAROSCOPIC CONVERTED TO OPEN PARTIAL NEPHRECTOMY;  Surgeon: Ferrara Gretel, MD;  Location: [facility];  Service: Urology;  Laterality: Left;   SALPINGOOPHORECTOMY Bilateral 06/18/2016   Procedure: BILATERAL SALPINGO OOPHORECTOMY;  Surgeon: Maurilio Ship, MD;  Location: [facility];  Service: Gynecology;  Laterality: Bilateral;   SHOULDER SURGERY Right 2010   TOE SURGERY Right Hammer Toe   2004   TONSILLECTOMY     Family History  Problem Relation Age of Onset   Hypertension Mother    Cancer Sister    Diabetes Maternal Aunt    Heart attack Paternal Aunt    Cancer Paternal Aunt        stomach   Breast cancer Other    Social History   Socioeconomic History   Marital status: Widowed    Spouse name: Not on file   Number of children: 1   Years of education: 66   Highest education level: 12th grade  Occupational History   Occupation: retired-semi    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 She has one child (daughter). She enjoys spending time with her daughter and her mom.    Social Drivers of Corporate Investment Banker Strain: Low Risk  (08/03/2023)   Overall Financial Resource Strain (CARDIA)    Difficulty of Paying Living Expenses: Not hard at all  Food Insecurity: No Food Insecurity (08/03/2023)   Hunger Vital Sign    Worried About Running Out of Food in the Last Year: Never true    Ran Out of Food in the Last Year: Never true  Transportation Needs: No Transportation Needs (08/03/2023)   PRAPARE - Administrator, Civil Service (Medical): No    Lack of Transportation (Non-Medical): No  Physical Activity: Insufficiently Active (08/03/2023)   Exercise Vital Sign    Days of Exercise  per Week: 7 days    Minutes of Exercise per Session: 10 min  Stress: No Stress Concern Present (08/03/2023)   [employer] of Occupational Health - Occupational Stress Questionnaire    Feeling of Stress : Not at all  Social Connections: Moderately Integrated (08/03/2023)   Social Connection and Isolation Panel [NHANES]    Frequency of Communication with Friends and Family: More than three times a week    Frequency of Social Gatherings with Friends and Family: Twice a week    Attends Religious Services: More than 4 times per year    Active Member of [employer] or Organizations: Yes    Attends Banker Meetings: 1 to 4 times per year    Marital Status: Widowed  Recent Concern: Social Connections - Moderately Isolated (06/21/2023)   Social Connection and Isolation Panel [NHANES]    Frequency of Communication with Friends and Family: More than three times a week    Frequency of Social Gatherings with Friends and Family: Twice a week    Attends Religious Services: More than 4 times per year    Active Member of [employer] or Organizations: No    Attends Banker Meetings: Not on file    Marital  Status: Widowed    Tobacco Counseling Counseling given: Not Answered   Clinical Intake:  Pre-visit preparation completed: Yes  Pain : No/denies pain     BMI - recorded: 32.6 Nutritional Status: BMI > 30  Obese Nutritional Risks: None Diabetes: Yes CBG done?: No Did pt. bring in CBG monitor from home?: No  How often do you need to have someone help you when you read instructions, pamphlets, or other written materials from your doctor or pharmacy?: 1 - Never What is the last grade level you completed in school?: 12  Interpreter Needed?: No      Activities of Daily Living    08/03/2023    3:03 PM 08/02/2023   11:09 AM  In your present state of health, do you have any difficulty performing the following activities:  Hearing? 0 0  Vision? 0 0  Difficulty concentrating or making decisions? 0 0  Walking or climbing stairs? 0 0  Dressing or bathing? 0 0  Doing errands, shopping? 0 0  Preparing Food and eating ? N N  Using the Toilet? N N  In the past six months, have you accidently leaked urine? N N  Do you have problems with loss of bowel control? N N  Managing your Medications? N N  Managing your Finances? N N  Housekeeping or managing your Housekeeping? N N    Patient Care Team: Breeback, Jade L, PA-C as PCP - General (Family Medicine)  Indicate any recent Medical Services you may have received from other than [facility] providers in the past year (date may be approximate).     Assessment:   This is a routine wellness examination for Bonnie Torres.  Hearing/Vision screen Vision Screening   Right eye Left eye Both eyes  Without correction     With correction   20/20  Hearing Screening - Comments:: Hearing normal   Goals Addressed             This Visit's Progress    Activity and Exercise Increased       She would like to increase her activity.       Depression Screen    08/03/2023    3:14 PM 03/22/2023    3:03 PM 12/18/2022   10:42 AM 12/18/2022  10:41 AM  10/28/2022    2:10 PM 09/16/2022    3:39 PM 07/31/2022    3:05 PM  PHQ 2/9 Scores  PHQ - 2 Score 0 0 1 1 5  0 0  PHQ- 9 Score    1 13      Fall Risk    08/03/2023    3:17 PM 08/02/2023   11:09 AM 12/18/2022   10:43 AM 10/28/2022    2:10 PM 09/16/2022    3:38 PM  Fall Risk   Falls in the past year? 0 1 0 0 0  Number falls in past yr: 0 0 0 0 0  Injury with Fall? 0 0 0 0 0  Risk for fall due to : No Fall Risks  No Fall Risks No Fall Risks No Fall Risks  Follow up Falls evaluation completed  Falls evaluation completed Falls evaluation completed Falls evaluation completed    MEDICARE RISK AT HOME: Medicare Risk at Home Any stairs in or around the home?: No If so, are there any without handrails?: No Home free of loose throw rugs in walkways, pet beds, electrical cords, etc?: Yes Adequate lighting in your home to reduce risk of falls?: Yes Life alert?: No Use of a cane, walker or w/c?: No Grab bars in the bathroom?: No Shower chair or bench in shower?: No Elevated toilet seat or a handicapped toilet?: No  TIMED UP AND GO:  Was the test performed?  Yes  Length of time to ambulate 10 feet: 11 sec Gait steady and fast without use of assistive device    Cognitive Function:        08/03/2023    3:18 PM 07/31/2022    3:10 PM 07/21/2021    3:18 PM 05/08/2020    2:55 PM 04/18/2020    2:43 PM  6CIT Screen  What Year? 0 points 0 points 0 points 0 points 0 points  What month? 0 points 0 points 0 points 0 points 0 points  What time? 0 points 0 points 0 points 0 points 0 points  Count back from 20 0 points 0 points 0 points 0 points 0 points  Months in reverse 0 points 0 points 0 points 0 points 0 points  Repeat phrase 2 points 2 points 2 points 2 points 0 points  Total Score 2 points 2 points 2 points 2 points 0 points    Immunizations Immunization History  Administered Date(s) Administered   Influenza Split 04/28/2011, 05/29/2013   Influenza, High Dose Seasonal PF 03/22/2023    Influenza,inj,Quad PF,6+ Mos 04/15/2015, 03/23/2016, 03/31/2017, 03/07/2018, 04/05/2019, 04/18/2020, 03/18/2022   Influenza-Unspecified 04/28/2011, 05/29/2013, 04/27/2021   PFIZER(Purple Top)SARS-COV-2 Vaccination 09/28/2019, 10/26/2019, 06/23/2020, 04/02/2021   PNEUMOCOCCAL CONJUGATE-20 09/16/2022   PPD Test 02/16/2018   Pfizer(Comirnaty)Fall Seasonal Vaccine 12 years and older 06/16/2022, 10/12/2022   Pneumococcal Polysaccharide-23 12/01/2017   Tdap 12/09/2010, 10/12/2014   Zoster Recombinant(Shingrix ) 12/26/2018, 04/27/2021    TDAP status: Up to date  Flu Vaccine status: Up to date  Pneumococcal vaccine status: Up to date  Covid-19 vaccine status: Completed vaccines  Qualifies for Shingles Vaccine? Yes   Zostavax completed No   Shingrix  Completed?: Yes  Screening Tests Health Maintenance  Topic Date Due   COVID-19 Vaccine (7 - 2024-25 season) 02/28/2023   OPHTHALMOLOGY EXAM  04/03/2023   HEMOGLOBIN A1C  12/20/2023   Diabetic kidney evaluation - Urine ACR  03/21/2024   FOOT EXAM  03/21/2024   Diabetic kidney evaluation - eGFR measurement  05/23/2024  Medicare Annual Wellness (AWV)  08/02/2024   DTaP/Tdap/Td (3 - Td or Tdap) 10/11/2024   MAMMOGRAM  07/13/2025   Fecal DNA (Cologuard)  05/31/2026   Pneumonia Vaccine 38+ Years old  Completed   INFLUENZA VACCINE  Completed   DEXA SCAN  Completed   Hepatitis C Screening  Completed   HIV Screening  Completed   Zoster Vaccines- Shingrix   Completed   HPV VACCINES  Aged Out    Health Maintenance  Health Maintenance Due  Topic Date Due   COVID-19 Vaccine (7 - 2024-25 season) 02/28/2023   OPHTHALMOLOGY EXAM  04/03/2023    Colorectal cancer screening: Type of screening: Cologuard. Completed 06/01/2023. Repeat every 3 years  Mammogram status: Completed 07/14/2023. Repeat every year  Bone Density status: Completed 06/19/2020. Results reflect: Bone density results: NORMAL. Repeat every as directed by PCP years.  Lung  Cancer Screening: (Low Dose CT Chest recommended if Age 66-80 years, 20 pack-year currently smoking OR have quit w/in 15years.) does not qualify.   Lung Cancer Screening Referral: n/a  Additional Screening:  Hepatitis C Screening: does qualify; Completed 10/14/2015  Vision Screening: Recommended annual ophthalmology exams for early detection of glaucoma and other disorders of the eye. Is the patient up to date with their annual eye exam?  Yes  Communication has been sent for records.  Who is the provider or what is the name of the office in which the patient attends annual eye exams? [facility] If pt is not established with a provider, would they like to be referred to a provider to establish care?  N/a .   Dental Screening: Recommended annual dental exams for proper oral hygiene  Diabetic Foot Exam: Diabetic Foot Exam: Completed 03/22/2023  Community Resource Referral / Chronic Care Management: CRR required this visit?  No   CCM required this visit?  No     Plan:     I have personally reviewed and noted the following in the patient's chart:   Medical and social history Use of alcohol, tobacco or illicit drugs  Current medications and supplements including opioid prescriptions. Patient is not currently taking opioid prescriptions. Functional ability and status Nutritional status Physical activity Advanced directives List of other physicians Hospitalizations, surgeries, and ER visits in previous 12 months Vitals Screenings to include cognitive, depression, and falls Referrals and appointments  In addition, I have reviewed and discussed with patient certain preventive protocols, quality metrics, and best practice recommendations. A written personalized care plan for preventive services as well as general preventive health recommendations were provided to patient.     Bonny Jon Mayor, CMA   08/03/2023   After Visit Summary: (In Person-Printed) AVS printed and  given to the patient  Nurse Notes:   Bonnie Torres is here for [facility] Wellness Visit. She does enjoy spending time with her mom and daughter.   A communication has been sent to [facility] center for latest eye exam.

## 2023-08-03 NOTE — Patient Instructions (Signed)
 [name] , Thank you for taking time to come for your Medicare Wellness Visit. I appreciate your ongoing commitment to your health goals. Please review the following plan we discussed and let me know if I can assist you in the future.   These are the goals we discussed:  Goals       Activity and Exercise Increased      She would like to increase her activity.       Patient Stated (pt-stated)      07/21/2021 AWV Goal: Exercise for General Health  Patient will verbalize understanding of the benefits of increased physical activity: Exercising regularly is important. It will improve your overall fitness, flexibility, and endurance. Regular exercise also will improve your overall health. It can help you control your weight, reduce stress, and improve your bone density. Over the next year, patient will increase physical activity as tolerated with a goal of at least 150 minutes of moderate physical activity per week.  You can tell that you are exercising at a moderate intensity if your heart starts beating faster and you start breathing faster but can still hold a conversation. Moderate-intensity exercise ideas include: Walking 1 mile (1.6 km) in about 15 minutes Biking Hiking Golfing Dancing Water  aerobics Patient will verbalize understanding of everyday activities that increase physical activity by providing examples like the following: Yard work, such as: [employer] Washing windows or floors Patient will be able to explain general safety guidelines for exercising:  Before you start a new exercise program, talk with your health care provider. Do not exercise so much that you hurt yourself, feel dizzy, or get very short of breath. Wear comfortable clothes and wear shoes with good support. Drink plenty of water  while you exercise to prevent dehydration or heat stroke. Work out until  your breathing and your heartbeat get faster.       Patient Stated (pt-stated)      07/31/2022 AWV Goal: Exercise for General Health  Patient will verbalize understanding of the benefits of increased physical activity: Exercising regularly is important. It will improve your overall fitness, flexibility, and endurance. Regular exercise also will improve your overall health. It can help you control your weight, reduce stress, and improve your bone density. Over the next year, patient will increase physical activity as tolerated with a goal of at least 150 minutes of moderate physical activity per week.  You can tell that you are exercising at a moderate intensity if your heart starts beating faster and you start breathing faster but can still hold a conversation. Moderate-intensity exercise ideas include: Walking 1 mile (1.6 km) in about 15 minutes Biking Hiking Golfing Dancing Water  aerobics Patient will verbalize understanding of everyday activities that increase physical activity by providing examples like the following: Yard work, such as: [employer] Gardening Washing windows or floors Patient will be able to explain general safety guidelines for exercising:  Before you start a new exercise program, talk with your health care provider. Do not exercise so much that you hurt yourself, feel dizzy, or get very short of breath. Wear comfortable clothes and wear shoes with good support. Drink plenty of water  while you exercise to prevent dehydration or heat stroke. Work out until your breathing and your heartbeat get faster.       Track and Manage My  Health Conditions      Timeframe:  Long-Range Goal Priority:  Medium Start Date: 01/22/21                            Expected End Date: 07/25/21                      Follow Up Date 06/04/21    Patient Goals: - continue to  check blood sugars at least  2-3 times/week and check blood sugar if you feel it is too high or too low - take the blood sugar log/meter to all doctor visits - check blood pressure weekly and notify provider if blood pressure is consistently outside of recommended parameters. - continue to take medications as prescribed. - continue to attend provider visits as recommended. - continue to eat healthy meals: fruits, vegetables, lean meats, healthy fats, low salt. Manage portion size - continue to remain active/exercise per provider recommendations.         This is a list of the screening recommended for you and due dates:  Health Maintenance  Topic Date Due   COVID-19 Vaccine (7 - 2024-25 season) 02/28/2023   Eye exam for diabetics  04/03/2023   Hemoglobin A1C  12/20/2023   Yearly kidney health urinalysis for diabetes  03/21/2024   Complete foot exam   03/21/2024   Yearly kidney function blood test for diabetes  05/23/2024   Medicare Annual Wellness Visit  08/02/2024   DTaP/Tdap/Td vaccine (3 - Td or Tdap) 10/11/2024   Mammogram  07/13/2025   Cologuard (Stool DNA test)  05/31/2026   Pneumonia Vaccine  Completed   Flu Shot  Completed   DEXA scan (bone density measurement)  Completed   Hepatitis C Screening  Completed   HIV Screening  Completed   Zoster (Shingles) Vaccine  Completed   HPV Vaccine  Aged Out

## 2023-08-04 ENCOUNTER — Telehealth: Payer: Self-pay | Admitting: *Deleted

## 2023-08-04 NOTE — Telephone Encounter (Signed)
 Spoke to patient to confirm upcoming afternoon [facility] clinic appointment on 2/12, paperwork will be sent via mail.  Gave location and time, also informed patient that the surgeon's office would be calling as well to get information from them similar to the packet that they will be receiving so make sure to do both.  Reminded patient that all providers will be coming to the clinic to see them HERE and if they had any questions to not hesitate to reach back out to myself or their navigators.

## 2023-08-06 ENCOUNTER — Encounter: Payer: Self-pay | Admitting: *Deleted

## 2023-08-06 DIAGNOSIS — C50311 Malignant neoplasm of lower-inner quadrant of right female breast: Secondary | ICD-10-CM | POA: Insufficient documentation

## 2023-08-10 NOTE — Progress Notes (Signed)
Radiation Oncology         [phone] ________________________________  Multidisciplinary Breast Oncology Clinic [facility]) Initial Outpatient Consultation  Name: Bonnie Torres MRN: [id number]  Date: 08/11/2023  DOB: July 03, 1955  WU:[id number], Lonna Cobb, PA-C  [name], MD   REFERRING PHYSICIAN: [name], MD  DIAGNOSIS: There were no encounter diagnoses.  Stage *** Right Breast LOQ, Invasive and in situ ductal carcinoma, ER+ / PR+ / Her2-, Grade 2  No diagnosis found.  HISTORY OF PRESENT ILLNESS::Bonnie Torres is a 68 y.o. female who is presenting to the office today for evaluation of her newly diagnosed right breast cancer. She is accompanied by ***. She is doing well overall.   She had routine screening mammography on 07/14/23 showing a possible abnormality in the right breast. She then underwent right breast diagnostic mammography with tomography and right breast ultrasonography at [facility] on 07/29/23 which revealed a suspicious 1.9 cm mass in the 5 o'clock right breast located 3 cmfn. Mammographically, two adjacent groups of heterogeneous calcifications located 1 cm superior to the mass were also demonstrated, collectively measuring approximately 1.5 cm. No evidence of right axillary lymphadenopathy was demonstrated sonographically.   Biopsy of the 5 o'clock right breast on 08/02/23 showed: grade 2 invasive ductal carcinoma measuring 1.2 cm in the greatest linear extent of the sample, and intermediate grade DCIS with necrosis. Prognostic indicators significant for: estrogen receptor, 95% positive and progesterone receptor, 95% positive, both with strong staining intensity. Proliferation marker Ki67 at 10%. HER2 negative. No lymph nodes were examined.   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 Laverle Patter). She also had right kidney cancer (clear cell renal cell  carcinoma) in 2017 and underwent a partial right nephrectomy in December of 2017. At the time of her partial right nephrectomy, she also underwent a hysterectomy (indicated by a fibroid uterus) and right salpingo oophorectomy for resection of an extensive intra abdominal / right ovarian soft tissue tumor (final gyn-path negative for atypia or malignancy). She did not undergo adjuvant therapy for either of her renal malignancies.   Menarche: *** years old Age at first live birth: *** years old GP: *** LMP: *** Contraceptive: *** HRT: ***   The patient was referred today for presentation in the multidisciplinary conference.  Radiology studies and pathology slides were presented there for review and discussion of treatment options.  A consensus was discussed regarding potential next steps.  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   Type 2 diabetes mellitus without complication, without long-term current use of insulin (HCC) 12/03/2017    PAST SURGICAL HISTORY: Past Surgical History:  Procedure Laterality Date   ABDOMINAL HYSTERECTOMY N/A 06/18/2016   Procedure: HYSTERECTOMY ABDOMINAL TOTAL;  Surgeon: Adolphus Birchwood, MD;  Location: [facility];  Service: Gynecology;  Laterality: N/A;   BREAST BIOPSY Right 08/02/2023   [place] RT BREAST BX W LOC DEV 1ST LESION IMG BX SPEC US GUIDE 08/02/2023 [facility] MAMMOGRAPHY   BREAST EXCISIONAL BIOPSY Right    BREAST SURGERY Right    breast biopsy-benign  CHOLECYSTECTOMY     MASS EXCISION N/A 06/18/2016   Procedure: RESECTION RETROPERITONEAL MASS WITH RETORPERITONEAL EXPLORATION ,LIGATION CHYLI CISTERNA,OPEN  CHOLECYSTECTOMY WITH MOBILATION HEPATIC FLEXURE;  Surgeon: Forde Dandy, MD;   Location: [facility];  Service: General;  Laterality: N/A;   PARTIAL NEPHRECTOMY Right 06/18/2016   Procedure: OPEN RIGHT NEPHRECTOMY PARTIAL;  Surgeon: Heloise Purpura, MD;  Location: [facility];  Service: Urology;  Laterality: Right;   ROBOTIC ASSITED PARTIAL NEPHRECTOMY Left 11/30/2019   Procedure: ATTEMPTED XI ROBOTIC ASSITED LAPAROSCOPIC CONVERTED TO OPEN PARTIAL NEPHRECTOMY;  Surgeon: Heloise Purpura, MD;  Location: [facility];  Service: Urology;  Laterality: Left;   SALPINGOOPHORECTOMY Bilateral 06/18/2016   Procedure: BILATERAL SALPINGO OOPHORECTOMY;  Surgeon: Adolphus Birchwood, MD;  Location: [facility];  Service: Gynecology;  Laterality: Bilateral;   SHOULDER SURGERY Right 2010   TOE SURGERY Right Hammer Toe   2004   TONSILLECTOMY      FAMILY HISTORY:  Family History  Problem Relation Age of Onset   Hypertension Mother    Cancer Sister    Diabetes Maternal Aunt    Heart attack Paternal Aunt    Cancer Paternal Aunt        stomach   Breast cancer Other     SOCIAL HISTORY:  Social History   Socioeconomic History   Marital status: Widowed    Spouse name: Not on file   Number of children: 1   Years of education: 46   Highest education level: 12th grade  Occupational History   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 She has one child (daughter). She enjoys spending time with her daughter and her mom.    Social Drivers of Corporate investment banker Strain: Low Risk  (08/03/2023)   Overall Financial Resource Strain (CARDIA)    Difficulty of Paying Living Expenses: Not hard at all  Food Insecurity: No Food Insecurity  (08/03/2023)   Hunger Vital Sign    Worried About Running Out of Food in the Last Year: Never true    Ran Out of Food in the Last Year: Never true  Transportation Needs: No Transportation Needs (08/03/2023)   PRAPARE - Administrator, Civil Service (Medical): No    Lack of Transportation (Non-Medical): No  Physical Activity: Insufficiently Active (08/03/2023)   Exercise Vital Sign    Days of Exercise per Week: 7 days    Minutes of Exercise per Session: 10 min  Stress: No Stress Concern Present (08/03/2023)   [employer] of Occupational Health - Occupational Stress Questionnaire    Feeling of Stress : Not at all  Social Connections: Moderately Integrated (08/03/2023)   Social Connection and Isolation Panel [NHANES]    Frequency of Communication with Friends and Family: More than three times a week    Frequency of Social Gatherings with Friends and Family: Twice a week    Attends Religious Services: More than 4 times per year    Active Member of [employer] or Organizations: Yes    Attends Banker Meetings: 1 to 4 times per  year    Marital Status: Widowed  Recent Concern: Social Connections - Moderately Isolated (06/21/2023)   Social Connection and Isolation Panel [NHANES]    Frequency of Communication with Friends and Family: More than three times a week    Frequency of Social Gatherings with Friends and Family: Twice a week    Attends Religious Services: More than 4 times per year    Active Member of [employer] or Organizations: No    Attends Banker Meetings: Not on file    Marital Status: Widowed    ALLERGIES: No Known Allergies  MEDICATIONS:  Current Outpatient Medications  Medication Sig Dispense Refill   atorvastatin (LIPITOR) 80 MG tablet TAKE 1 TABLET BY MOUTH DAILY 100 tablet 2   Blood Glucose Monitoring Suppl (ONETOUCH VERIO FLEX SYSTEM) w/Device KIT Check fasting blood sugar every morning. DM ICD10 E11.9 1 kit Prn   clindamycin (CLINDAGEL) 1 %  gel Apply topically. (Patient not taking: Reported on 08/03/2023)     diclofenac Sodium (VOLTAREN) 1 % GEL APPLY 4 GRAMS TOPICALLY 4 TIMES  DAILY TO LOWER EXTREMITY JOINT.  MANUFACTURER RECOMMENDS NOT  EXCEEDING 32 GRAMS/DAY 350 g 1   hydroquinone 4 % cream Apply topically 2 (two) times daily. (Patient not taking: Reported on 08/03/2023)     Lancets (ONETOUCH DELICA PLUS LANCET33G) MISC CHECK BLOOD SUGAR TWICE DAILY (Patient not taking: Reported on 08/03/2023) 200 each 2   metFORMIN (GLUCOPHAGE-XR) 750 MG 24 hr tablet Take 1 tablet (750 mg total) by mouth daily with breakfast. 100 tablet 2   metoprolol succinate (TOPROL-XL) 100 MG 24 hr tablet TAKE 1 TABLET BY MOUTH DAILY 100 tablet 2   Olmesartan-amLODIPine-HCTZ 20-5-12.5 MG TABS TAKE 1 TABLET BY MOUTH DAILY 100 tablet 2   ONETOUCH VERIO test strip CHECK BLOOD GLUCOSE TWICE DAILY  AS DIRECTED AS NEEDED 200 strip 2   potassium chloride SA (KLOR-CON M) 20 MEQ tablet Take 1 tablet (20 mEq total) by mouth daily. 100 tablet 2   spironolactone (ALDACTONE) 25 MG tablet TAKE 1 TABLET BY MOUTH TWICE  DAILY (Patient not taking: Reported on 08/03/2023) 200 tablet 2   tretinoin (RETIN-A) 0.1 % cream Apply topically daily.     triamcinolone cream (KENALOG) 0.1 % Apply 1 Application topically 2 (two) times daily. (Patient not taking: Reported on 08/03/2023) 30 g 0   zolpidem (AMBIEN) 10 MG tablet TAKE 1/2 TO 1 TABLET(5 TO 10 MG) BY MOUTH AT BEDTIME 90 tablet 1   No current facility-administered medications for this encounter.    REVIEW OF SYSTEMS: A 10+ POINT REVIEW OF SYSTEMS WAS OBTAINED including neurology, dermatology, psychiatry, cardiac, respiratory, lymph, extremities, GI, GU, musculoskeletal, constitutional, reproductive, HEENT. On the provided form, she reports  . She denies *** and any other symptoms.    PHYSICAL EXAM:  vitals were not taken for this visit.  {may need to copy over vitals} Lungs are clear to auscultation bilaterally. Heart has regular rate  and rhythm. No palpable cervical, supraclavicular, or axillary adenopathy. Abdomen soft, non-tender, normal bowel sounds. Breast: *** breast with no palpable mass, nipple discharge, or bleeding. *** breast with ***.   KPS = ***  100 - Normal; no complaints; no evidence of disease. 90   - Able to carry on normal activity; minor signs or symptoms of disease. 80   - Normal activity with effort; some signs or symptoms of disease. 57   - Cares for self; unable to carry on normal activity or to do active work. 60   -  Requires occasional assistance, but is able to care for most of his personal needs. 50   - Requires considerable assistance and frequent medical care. 40   - Disabled; requires special care and assistance. 30   - Severely disabled; hospital admission is indicated although death not imminent. 20   - Very sick; hospital admission necessary; active supportive treatment necessary. 10   - Moribund; fatal processes progressing rapidly. 0     - Dead  Karnofsky DA, Abelmann WH, Craver LS and Burchenal [facility] [phone]) The use of the nitrogen mustards in the palliative treatment of carcinoma: with particular reference to bronchogenic carcinoma Cancer 1 [id number]  LABORATORY DATA:  Lab Results  Component Value Date   WBC 5.7 03/22/2023   HGB 13.1 03/22/2023   HCT 38.3 03/22/2023   MCV 80 03/22/2023   PLT 337 03/22/2023   Lab Results  Component Value Date   NA 141 05/24/2023   K 4.1 05/24/2023   CL 103 05/24/2023   CO2 22 05/24/2023   Lab Results  Component Value Date   ALT 15 05/24/2023   AST 16 05/24/2023   ALKPHOS 91 05/24/2023   BILITOT 0.6 05/24/2023    PULMONARY FUNCTION TEST:   Review Flowsheet        No data to display          RADIOGRAPHY: [place] RT BREAST BX W LOC DEV 1ST LESION IMG BX SPEC US GUIDE Addendum Date: 08/03/2023 ADDENDUM REPORT: 08/03/2023 12:11 ADDENDUM: Pathology revealed GRADE II INVASIVE DUCTAL CARCINOMA, DUCTAL CARCINOMA IN SITU, SOLID AND COMEDO,  INTERMEDIATE NUCLEAR GRADE, WITH NECROSIS, CALCIFICATIONS: PRESENT IN DCIS of the RIGHT breast, 5 o'clock, 3cmfn, (heart clip). This was found to be concordant by Dr. Frederico Hamman. Recommendations 1. ***Note that the 2 groups of additional calcifications located just superior to the biopsied mass need to be localized for excision. 2. Consider MRI due to the density of the breast tissue and multifocal abnormalities. Pathology results were discussed with the patient by telephone. The patient reported doing well after the biopsy with tenderness at the site. Post biopsy instructions and care were reviewed and questions were answered. The patient was encouraged to call [facility] for any additional concerns. My direct phone number was provided. The patient was referred to [facility] on August 11, 2023. Pathology results reported by Rene Kocher, RN on 08/03/2023. Electronically Signed   By: Frederico Hamman M.D.   On: 08/03/2023 12:11   Result Date: 08/03/2023 CLINICAL DATA:  68 year old female presenting for ultrasound-guided biopsy of a mass with calcifications. EXAM: ULTRASOUND GUIDED RIGHT BREAST CORE NEEDLE BIOPSY COMPARISON:  Previous exam(s). PROCEDURE: I met with the patient and we discussed the procedure of ultrasound-guided biopsy, including benefits and alternatives. We discussed the high likelihood of a successful procedure. We discussed the risks of the procedure, including infection, bleeding, tissue injury, clip migration, and inadequate sampling. Informed written consent was given. The usual time-out protocol was performed immediately prior to the procedure. Lesion quadrant: Lower inner quadrant Using sterile technique and 1% Lidocaine as local anesthetic, under direct ultrasound visualization, a 14 gauge spring-loaded device was used to perform biopsy of a mass with calcifications in the right  breast at 5 o'clock, 3 cm from the nipple using a medial approach. The samples were or x-rayed demonstrating a few scattered calcifications. At the conclusion of the procedure a heart shaped tissue marker clip was deployed into the  biopsy cavity. Follow up 2 view mammogram was performed and dictated separately. IMPRESSION: Ultrasound guided biopsy of a right breast mass with calcifications at 5 o'clock (heart clip). No apparent complications. Electronically Signed: By: Frederico Hamman M.D. On: 08/02/2023 09:24   MM 3D DIAGNOSTIC MAMMOGRAM UNILATERAL RIGHT BREAST Addendum Date: 08/03/2023 ADDENDUM REPORT: 08/03/2023 11:22 ADDENDUM: An addendum is made to this report due to an omission in the findings section of this report. This should include: Normal lymph nodes are present in the right axilla. There is no right axillary lymphadenopathy. Electronically Signed   By: Edwin Cap M.D.   On: 08/03/2023 11:22   Result Date: 08/03/2023 CLINICAL DATA:  Screening recall for possible right breast mass and adjacent calcifications. EXAM: DIGITAL DIAGNOSTIC UNILATERAL RIGHT MAMMOGRAM WITH TOMOSYNTHESIS AND CAD; ULTRASOUND RIGHT BREAST LIMITED TECHNIQUE: Right digital diagnostic mammography and breast tomosynthesis was performed. The images were evaluated with computer-aided detection. ; Targeted ultrasound examination of the right breast was performed COMPARISON:  Previous exam(s). ACR Breast Density Category c: The breasts are heterogeneously dense, which may obscure small masses. FINDINGS: Additional tomograms were performed of the right breast. There is an ill-defined somewhat oval mass in the lower inner right breast measuring 1.8 cm. Spot compression magnification views the lower inner right breast demonstrate 2 adjacent groups of heterogeneous calcifications located 1 cm superior to the mass with these calcifications together measuring 1.5 cm. Targeted ultrasound of the right breast was performed. There is an  irregular hypoechoic mass containing calcifications in the right breast at 5 o'clock 3 cm from nipple measuring 1.6 x 0.9 x 1.9 cm. This corresponds well with the mass seen in the lower inner right breast at mammography. IMPRESSION: 1. Suspicious 1.9 cm mass in the right breast at the 5 o'clock position. 2. Suspicious adjacent calcifications together measuring up to 1.6 cm in the upper inner right breast. RECOMMENDATION: 1. Recommend ultrasound-guided core biopsy of the mass in the right breast at the 5 o'clock position. 2. Recommend stereotactic guided biopsy of the adjacent calcifications in the upper inner right breast. I have discussed the findings and recommendations with the patient. If applicable, a reminder letter will be sent to the patient regarding the next appointment. BI-RADS CATEGORY  4: Suspicious. Electronically Signed: By: Edwin Cap M.D. On: 07/29/2023 16:09   [place] LIMITED ULTRASOUND INCLUDING AXILLA RIGHT BREAST Addendum Date: 08/03/2023 ADDENDUM REPORT: 08/03/2023 11:22 ADDENDUM: An addendum is made to this report due to an omission in the findings section of this report. This should include: Normal lymph nodes are present in the right axilla. There is no right axillary lymphadenopathy. Electronically Signed   By: Edwin Cap M.D.   On: 08/03/2023 11:22   Result Date: 08/03/2023 CLINICAL DATA:  Screening recall for possible right breast mass and adjacent calcifications. EXAM: DIGITAL DIAGNOSTIC UNILATERAL RIGHT MAMMOGRAM WITH TOMOSYNTHESIS AND CAD; ULTRASOUND RIGHT BREAST LIMITED TECHNIQUE: Right digital diagnostic mammography and breast tomosynthesis was performed. The images were evaluated with computer-aided detection. ; Targeted ultrasound examination of the right breast was performed COMPARISON:  Previous exam(s). ACR Breast Density Category c: The breasts are heterogeneously dense, which may obscure small masses. FINDINGS: Additional tomograms were performed of the right breast.  There is an ill-defined somewhat oval mass in the lower inner right breast measuring 1.8 cm. Spot compression magnification views the lower inner right breast demonstrate 2 adjacent groups of heterogeneous calcifications located 1 cm superior to the mass with these calcifications together measuring 1.5 cm. Targeted ultrasound of the  right breast was performed. There is an irregular hypoechoic mass containing calcifications in the right breast at 5 o'clock 3 cm from nipple measuring 1.6 x 0.9 x 1.9 cm. This corresponds well with the mass seen in the lower inner right breast at mammography. IMPRESSION: 1. Suspicious 1.9 cm mass in the right breast at the 5 o'clock position. 2. Suspicious adjacent calcifications together measuring up to 1.6 cm in the upper inner right breast. RECOMMENDATION: 1. Recommend ultrasound-guided core biopsy of the mass in the right breast at the 5 o'clock position. 2. Recommend stereotactic guided biopsy of the adjacent calcifications in the upper inner right breast. I have discussed the findings and recommendations with the patient. If applicable, a reminder letter will be sent to the patient regarding the next appointment. BI-RADS CATEGORY  4: Suspicious. Electronically Signed: By: Edwin Cap M.D. On: 07/29/2023 16:09   MM CLIP PLACEMENT RIGHT Result Date: 08/02/2023 CLINICAL DATA:  Post biopsy mammogram of the right breast for clip placement. EXAM: 3D DIAGNOSTIC RIGHT MAMMOGRAM POST ULTRASOUND BIOPSY COMPARISON:  Previous exam(s). FINDINGS: 3D Mammographic images were obtained following ultrasound guided biopsy of a mass with and calcifications in the lower-inner right breast. The biopsy marking clip is in expected position at the site of biopsy. IMPRESSION: Appropriate positioning of the heart shaped biopsy marking clip at the site of biopsy in the lower-inner right breast. Final Assessment: Post Procedure Mammograms for Marker Placement Electronically Signed   By: Frederico Hamman M.D.   On: 08/02/2023 09:35   MM 3D SCREENING MAMMOGRAM BILATERAL BREAST Result Date: 07/15/2023 CLINICAL DATA:  Screening. EXAM: DIGITAL SCREENING BILATERAL MAMMOGRAM WITH TOMOSYNTHESIS AND CAD TECHNIQUE: Bilateral screening digital craniocaudal and mediolateral oblique mammograms were obtained. Bilateral screening digital breast tomosynthesis was performed. The images were evaluated with computer-aided detection. COMPARISON:  Previous exam(s). ACR Breast Density Category c: The breasts are heterogeneously dense, which may obscure small masses. FINDINGS: In the right breast, a possible mass with adjacent calcifications warrants further evaluation. In the left breast, no findings suspicious for malignancy. IMPRESSION: Further evaluation is suggested for a possible mass with adjacent calcifications in the right breast. RECOMMENDATION: Diagnostic mammogram and possibly ultrasound of the right breast. (Code:[id number]) The patient will be contacted regarding the findings, and additional imaging will be scheduled. BI-RADS CATEGORY  0: Incomplete: Need additional imaging evaluation. Electronically Signed   By: Sherian Rein M.D.   On: 07/15/2023 15:46      IMPRESSION: *** {DIAGNOSIS HERE}  Patient will be a good candidate for breast conservation with radiotherapy to *** breast. We discussed the general course of radiation, potential side effects, and toxicities with radiation and the patient is interested in this approach. ***   PLAN:  *** (copy notes from board at conference)   ------------------------------------------------  Billie Lade, PhD, MD  This document serves as a record of services personally performed by Antony Blackbird, MD. It was created on his behalf by Neena Rhymes, a trained medical scribe. The creation of this record is based on the scribe's personal observations and the provider's statements to them. This document has been checked and approved by the attending provider.

## 2023-08-11 ENCOUNTER — Inpatient Hospital Stay: Payer: 59 | Attending: Internal Medicine

## 2023-08-11 ENCOUNTER — Inpatient Hospital Stay (HOSPITAL_BASED_OUTPATIENT_CLINIC_OR_DEPARTMENT_OTHER): Payer: 59 | Admitting: Hematology and Oncology

## 2023-08-11 ENCOUNTER — Other Ambulatory Visit: Payer: Self-pay | Admitting: General Surgery

## 2023-08-11 ENCOUNTER — Ambulatory Visit
Admission: RE | Admit: 2023-08-11 | Discharge: 2023-08-11 | Disposition: A | Discharge status: Home / Self Care | Payer: 59 | Source: Ambulatory Visit | Attending: Radiation Oncology | Admitting: Radiation Oncology

## 2023-08-11 ENCOUNTER — Ambulatory Visit: Payer: 59 | Admitting: Physical Therapy

## 2023-08-11 ENCOUNTER — Inpatient Hospital Stay (HOSPITAL_BASED_OUTPATIENT_CLINIC_OR_DEPARTMENT_OTHER): Payer: 59 | Admitting: Genetic Counselor

## 2023-08-11 ENCOUNTER — Encounter: Payer: Self-pay | Admitting: *Deleted

## 2023-08-11 ENCOUNTER — Encounter: Payer: Self-pay | Admitting: General Practice

## 2023-08-11 ENCOUNTER — Encounter: Payer: Self-pay | Admitting: Hematology and Oncology

## 2023-08-11 ENCOUNTER — Encounter: Payer: Self-pay | Admitting: Genetic Counselor

## 2023-08-11 VITALS — BP 150/75 | HR 74 | Temp 97.3°F | Resp 16 | Ht 58.66 in | Wt 155.6 lb

## 2023-08-11 DIAGNOSIS — Z803 Family history of malignant neoplasm of breast: Secondary | ICD-10-CM

## 2023-08-11 DIAGNOSIS — C50311 Malignant neoplasm of lower-inner quadrant of right female breast: Secondary | ICD-10-CM | POA: Insufficient documentation

## 2023-08-11 DIAGNOSIS — Z170 Estrogen receptor positive status [ER+]: Secondary | ICD-10-CM

## 2023-08-11 DIAGNOSIS — Z85528 Personal history of other malignant neoplasm of kidney: Secondary | ICD-10-CM

## 2023-08-11 DIAGNOSIS — C50511 Malignant neoplasm of lower-outer quadrant of right female breast: Secondary | ICD-10-CM | POA: Diagnosis not present

## 2023-08-11 DIAGNOSIS — Z8041 Family history of malignant neoplasm of ovary: Secondary | ICD-10-CM

## 2023-08-11 LAB — CMP (CANCER CENTER ONLY)
ALT: 17 U/L (ref 0–44)
AST: 21 U/L (ref 15–41)
Albumin: 4.7 g/dL (ref 3.5–5.0)
Alkaline Phosphatase: 85 U/L (ref 38–126)
Anion gap: 6 (ref 5–15)
BUN: 14 mg/dL (ref 8–23)
CO2: 29 mmol/L (ref 22–32)
Calcium: 10.3 mg/dL (ref 8.9–10.3)
Chloride: 104 mmol/L (ref 98–111)
Creatinine: 0.92 mg/dL (ref 0.44–1.00)
GFR, Estimated: >60 mL/min (ref >60)
Glucose, Bld: 108 mg/dL — ABNORMAL HIGH (ref 70–99)
Potassium: 4 mmol/L (ref 3.5–5.1)
Sodium: 139 mmol/L (ref 135–145)
Total Bilirubin: 0.8 mg/dL (ref 0.0–1.2)
Total Protein: 8.1 g/dL (ref 6.5–8.1)

## 2023-08-11 LAB — CBC WITH DIFFERENTIAL (CANCER CENTER ONLY)
Abs Immature Granulocytes: 0.02 K/uL (ref 0.00–0.07)
Basophils Absolute: 0 K/uL (ref 0.0–0.1)
Basophils Relative: 1 %
Eosinophils Absolute: 0.1 K/uL (ref 0.0–0.5)
Eosinophils Relative: 2 %
HCT: 37.7 % (ref 36.0–46.0)
Hemoglobin: 13.2 g/dL (ref 12.0–15.0)
Immature Granulocytes: 0 %
Lymphocytes Relative: 50 %
Lymphs Abs: 2.7 K/uL (ref 0.7–4.0)
MCH: 26.1 pg (ref 26.0–34.0)
MCHC: 35 g/dL (ref 30.0–36.0)
MCV: 74.5 fL — ABNORMAL LOW (ref 80.0–100.0)
Monocytes Absolute: 0.4 K/uL (ref 0.1–1.0)
Monocytes Relative: 7 %
Neutro Abs: 2.1 K/uL (ref 1.7–7.7)
Neutrophils Relative %: 40 %
Platelet Count: 303 K/uL (ref 150–400)
RBC: 5.06 MIL/uL (ref 3.87–5.11)
RDW: 13.4 % (ref 11.5–15.5)
WBC Count: 5.4 K/uL (ref 4.0–10.5)
nRBC: 0 % (ref 0.0–0.2)

## 2023-08-11 LAB — GENETIC SCREENING ORDER

## 2023-08-11 NOTE — Progress Notes (Signed)
REFERRING PROVIDER: Serena Croissant, MD [address] [place],  [place] [address]  PRIMARY PROVIDER:  Jomarie Longs, PA-C  PRIMARY REASON FOR VISIT:  1. Malignant neoplasm of lower-inner quadrant of right breast of female, estrogen receptor positive (HCC)   2. History of kidney cancer   3. Family history of breast cancer   4. Family history of ovarian cancer    HISTORY OF PRESENT ILLNESS:   Bonnie Torres, a 68 y.o. female, was seen for a [place] cancer genetics consultation during the breast multidisciplinary clinic at the request of Dr. Pamelia Hoit due to a personal history of breast cancer.  Bonnie Torres presents to clinic today to discuss the possibility of a hereditary predisposition to cancer, to discuss genetic testing, and to further clarify her future cancer risks, as well as potential cancer risks for family members.   In 2025, at the age of 11, Bonnie Torres was diagnosed with invasive ductal carcinoma of the right breast (ER+/PR+/HER2-). The treatment plan is pending.  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    CANCER HISTORY:  Oncology History  Malignant neoplasm of lower-inner quadrant of right breast of female, estrogen receptor positive (HCC)  08/03/2023 Initial Diagnosis   Screening mammogram detected right breast mass with calcifications LIQ posterior at 5:00: 1.9 cm ultrasound biopsy: Grade 2 IDC with intermediate grade DCIS with necrosis axilla negative, ER 95%, PR 95%, Ki67 10%, HER2 negative by [facility]   08/11/2023 Cancer Staging   Staging form: Breast, AJCC 8th Edition - Clinical stage from 08/11/2023: Stage IA (cT1c, cN0, cM0, G2, ER+, PR+, HER2-) - Signed by Serena Croissant, MD on 08/11/2023 Stage prefix: Initial diagnosis Histologic grading system: 3 grade system       Past Medical History:  Diagnosis Date   Breast cancer  (HCC)    Complication of anesthesia    Gout    1 yr ago right great toe   Heart rate fast    tx Metoprolol   Hyperlipidemia    Hypertension    PONV (postoperative nausea and vomiting)    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   Type 2 diabetes mellitus without complication, without long-term current use of insulin (HCC) 12/03/2017    Past Surgical History:  Procedure Laterality Date   ABDOMINAL HYSTERECTOMY N/A 06/18/2016   Procedure: HYSTERECTOMY ABDOMINAL TOTAL;  Surgeon: Adolphus Birchwood, MD;  Location: [facility];  Service: Gynecology;  Laterality: N/A;   BREAST BIOPSY Right 08/02/2023   [place] RT BREAST BX W LOC DEV 1ST LESION IMG BX SPEC US GUIDE 08/02/2023 [facility]AMMO[facility]AP[facility]   BREAST EXCISIONAL BIOPSY Right    BREAST SURGERY Right    breast biopsy-benign   CHOLECYSTECTOMY     MASS EXCISION N/A 06/18/2016   Procedure: RESECTION RETROPERITONEAL MASS WITH RETORPERITONEAL EXPLORATION ,LIGATION CHYLI CISTERNA,OPEN  CHOLECYSTECTOMY WITH MOBILATION HEPATIC FLEXURE;  Surgeon: Forde Dandy, MD;  Location: [facility];  Service: General;  Laterality: N/A;   PARTIAL NEPHRECTOMY Right 06/18/2016   Procedure: OPEN RIGHT NEPHRECTOMY PARTIAL;  Surgeon: Heloise Purpura, MD;  [facility]: [facility];  Service: Urology;  Laterality: Right;   ROBOTIC ASSITED PARTIAL NEPHRECTOMY Left 11/30/2019   Procedure: ATTEMPTED XI ROBOTIC ASSITED LAPAROSCOPIC CONVERTED TO OPEN PARTIAL NEPHRECTOMY;  Surgeon: Heloise Purpura, MD;  [facility];  Service: Urology;  Laterality: Left;   SALPINGOOPHORECTOMY Bilateral 06/18/2016   Procedure: BILATERAL SALPINGO  OOPHORECTOMY;  Surgeon: Adolphus Birchwood, MD;  [facility];  Service: Gynecology;  Laterality: Bilateral;   SHOULDER SURGERY Right 2010   TOE SURGERY Right Hammer Toe   2004   TONSILLECTOMY       FAMILY HISTORY:  We obtained a detailed, 4-generation family history.  Significant diagnoses are listed below: Family History  Problem Relation Age of Onset   Stomach cancer Paternal  Aunt        dx 42s   Ovarian cancer Cousin 82   Breast cancer Niece    Ovarian cancer Half-Sister 38       mat half sister     Bonnie Torres is unaware of previous family history of genetic testing for hereditary cancer risks. There is no reported Ashkenazi Jewish ancestry. There is no known consanguinity.  Other relatives are unavailable for genetic testing at this time.  GENETIC COUNSELING ASSESSMENT: Bonnie Torres is a 68 y.o. female with a personal history of breast cancer and family history of ovarian cancer which is somewhat suggestive of a hereditary breast/ovarian cancer syndrome as well a personal history of bilateral kidney cancer which is somewhat suggestive of a hereditary kidney cancer syndrome. We, therefore, discussed and recommended the following at today's visit.   DISCUSSION: We discussed that 5 - 10% of cancer is hereditary, with most cases of hereditary breast cancer associated with mutations in BRCA1/2.  There are other genes that can be associated with hereditary breast, ovarian, or kidney cancer syndromes. Type of cancer risk and level of risk are gene-specific. We discussed that testing is beneficial for several reasons including knowing how to follow individuals after completing their treatment, identifying whether potential treatment/surgery options would be beneficial, and understanding if other family members could be at risk for cancer and allowing them to undergo genetic testing.   We reviewed the characteristics, features and inheritance patterns of hereditary cancer syndromes. We also discussed genetic testing, including the appropriate family members to test, the process of testing, insurance coverage and turn-around-time for results. We discussed the implications of a negative, positive and/or variant of uncertain significant result. In order to get genetic test results in a timely manner so that Bonnie Torres can use these genetic test results for surgical decisions, we  recommended Bonnie Torres pursue genetic testing for the [place] BRCAPlus Panel.  The BRCAPlus gene panel offered by [facility] and includes sequencing and rearrangement analysis for the following 13 genes: ATM, BARD1, BRCA1, BRCA2, CDH1, CHEK2, NF1, PALB2, PTEN, RAD51C, RAD51D, STK11 and TP53.  Once complete, we recommend Bonnie Torres pursue reflex genetic testing to a more comprehensive gene panel.   The CancerNext-Expanded gene panel offered by [facility] and includes sequencing, rearrangement, and RNA analysis for the following 76 genes: AIP, ALK, APC, ATM, AXIN2, BAP1, BARD1, BMPR1A, BRCA1, BRCA2, BRIP1, CDC73, CDH1, CDK4, CDKN1B, CDKN2A, CEBPA, CHEK2, CTNNA1, DDX41, DICER1, ETV6, FH, FLCN, GATA2, LZTR1, MAX, MBD4, MEN1, MET, MLH1, MSH2, MSH3, MSH6, MUTYH, NF1, NF2, NTHL1, PALB2, PHOX2B, PMS2, POT1, PRKAR1A, PTCH1, PTEN, RAD51C, RAD51D, RB1, RET, RUNX1, SDHA, SDHAF2, SDHB, SDHC, SDHD, SMAD4, SMARCA4, SMARCB1, SMARCE1, STK11, SUFU, TMEM127, TP53, TSC1, TSC2, VHL, and WT1 (sequencing and deletion/duplication); EGFR, HOXB13, KIT, MITF, PDGFRA, POLD1, and POLE (sequencing only); EPCAM and GREM1 (deletion/duplication only).   Based on Bonnie Torres personal history of breast cancer and family history of ovarian cancer, she meets NCCN medical criteria for genetic testing. She is the most informative relative availabel for genetic testing.  Despite that she meets criteria, she may  still have an out of pocket cost. We discussed that if her out of pocket cost for testing is over $100, the laboratory should contact them to discuss self-pay prices, patient pay assistance programs, if applicable, and other billing options.   PLAN: After considering the risks, benefits, and limitations, Bonnie Torres provided informed consent to pursue genetic testing and the blood sample was sent to [facility] for analysis of the BRCAPlus and CancerNext-Expanded +RNAinsight Panel. Results should be available within  approximately 1-2 weeks' time, at which point they will be disclosed by telephone to Bonnie Torres, as will any additional recommendations warranted by these results. Bonnie Torres will receive a summary of her genetic counseling visit and a copy of her results once available. This information will also be available in Epic.   Bonnie Torres questions were answered to her satisfaction today. Our contact information was provided should additional questions or concerns arise. Thank you for the referral and allowing [place] to share in the care of your patient.   Bonnie Torres M. Rennie Plowman, MS, [facility] Genetic Counselor [email or web address] (P) [phone]   25 minutes were spent on the date of the encounter in service to the patient including preparation, face-to-face consultation, documentation and care coordination.  The patient was accompanied by her daughter and a friend.  Dr. Pamelia Hoit was available to discuss this case as needed.   _______________________________________________________________________ For Office Staff:  Number of people involved in session: 3 Was an Intern/ student involved with case: no

## 2023-08-11 NOTE — Progress Notes (Signed)
[facility] CONSULT NOTE  Patient Care Team: Nolene Ebbs as PCP - General (Family Medicine) Jobe Igo, OD (Optometry) Donnelly Angelica, RN as Oncology Nurse Navigator Pershing Proud, RN as Oncology Nurse Navigator [name], MD as Consulting Physician (General Surgery) Serena Croissant, MD as Consulting Physician (Hematology and Oncology) Antony Blackbird, MD as Consulting Physician (Radiation Oncology)  CHIEF COMPLAINTS/PURPOSE OF CONSULTATION:  Newly diagnosed breast cancer  HISTORY OF PRESENTING ILLNESS:  History of Present Illness   Bonnie Torres is a 68 year old female with invasive ductal cancer who presents for oncology consultation. She is accompanied by her daughter.  Following a screening mammogram on July 29, 2023, calcifications spanning six centimeters were detected in the left breast, leading to a diagnosis of invasive ductal cancer. Two biopsies confirmed intermediate grade ductal carcinoma in situ (DCIS) with solid and comedonecrosis patterns, and calcifications.  The cancer is staged as 1A, with a size of approximately 1.9 centimeters based on imaging. No enlarged lymph nodes were detected. The cancer is grade 2, and the KI-67 proliferation index is 10%, indicating a low proliferation rate.  The estrogen receptor was 95% positive, and the progesterone receptor was 90% positive, while HER2 was negative, indicating a more favorable prognosis.      I reviewed her records extensively and collaborated the history with the patient.  SUMMARY OF ONCOLOGIC HISTORY: Oncology History  Malignant neoplasm of lower-inner quadrant of right breast of female, estrogen receptor positive (HCC)  08/03/2023 Initial Diagnosis   Screening mammogram detected right breast mass with calcifications LIQ posterior at 5:00: 1.9 cm ultrasound biopsy: Grade 2 IDC with intermediate grade DCIS with necrosis axilla negative, ER 95%, PR 95%, Ki67 10%, HER2  negative by [facility]      MEDICAL HISTORY:  Past Medical History:  Diagnosis Date   Breast cancer (HCC)    Complication of anesthesia    Gout    1 yr ago right great toe   Heart rate fast    tx Metoprolol   Hyperlipidemia    Hypertension    PONV (postoperative nausea and vomiting)    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   Type 2 diabetes mellitus without complication, without long-term current use of insulin (HCC) 12/03/2017    SURGICAL HISTORY: Past Surgical History:  Procedure Laterality Date   ABDOMINAL HYSTERECTOMY N/A 06/18/2016   Procedure: HYSTERECTOMY ABDOMINAL TOTAL;  Surgeon: Adolphus Birchwood, MD;  [facility];  Service: Gynecology;  Laterality: N/A;   BREAST BIOPSY Right 08/02/2023   [place] RT BREAST BX W LOC DEV 1ST LESION IMG BX SPEC US GUIDE 08/02/2023 [facility]MO[facility]   BREAST EXCISIONAL BIOPSY Right    BREAST SURGERY Right    breast biopsy-benign   CHOLECYSTECTOMY     MASS EXCISION N/A 06/18/2016   Procedure: RESECTION RETROPERITONEAL MASS WITH RETORPERITONEAL EXPLORATION ,LIGATION CHYLI CISTERNA,OPEN  CHOLECYSTECTOMY WITH MOBILATION HEPATIC FLEXURE;  Surgeon: Forde Dandy, MD;  [facility];  Service: General;  Laterality: N/A;   PARTIAL NEPHRECTOMY Right 06/18/2016   Procedure: OPEN RIGHT NEPHRECTOMY PARTIAL;  Surgeon: Heloise Purpura, MD;  [facility];  Service: Urology;  Laterality: Right;   ROBOTIC ASSITED PARTIAL NEPHRECTOMY Left 11/30/2019   Procedure: ATTEMPTED XI ROBOTIC ASSITED LAPAROSCOPIC CONVERTED TO OPEN PARTIAL NEPHRECTOMY;  Surgeon: Heloise Purpura, MD;  [facility];  Service: Urology;  Laterality: Left;   SALPINGOOPHORECTOMY Bilateral 06/18/2016   Procedure: BILATERAL SALPINGO OOPHORECTOMY;  Surgeon: Adolphus Birchwood, MD;  [facility];  Service:  Gynecology;  Laterality: Bilateral;   SHOULDER SURGERY Right 2010   TOE SURGERY Right Hammer Toe   2004   TONSILLECTOMY      SOCIAL HISTORY: Social History   Socioeconomic History   Marital  status: Widowed    Spouse name: Not on file   Number of children: 1   Years of education: 27   Highest education level: 12th grade  Occupational History   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 She has one child (daughter). She enjoys spending time with her daughter and her mom.    Social Drivers of Corporate investment banker Strain: Low Risk  (08/03/2023)   Overall Financial Resource Strain (CARDIA)    Difficulty of Paying Living Expenses: Not hard at all  Food Insecurity: No Food Insecurity (08/03/2023)   Hunger Vital Sign    Worried About Running Out of Food in the Last Year: Never true    Ran Out of Food in the Last Year: Never true  Transportation Needs: No Transportation Needs (08/03/2023)   PRAPARE - Administrator, Civil Service (Medical): No    Lack of Transportation (Non-Medical): No  Physical Activity: Insufficiently Active (08/03/2023)   Exercise Vital Sign    Days of Exercise per Week: 7 days    Minutes of Exercise per Session: 10 min  Stress: No Stress Concern Present (08/03/2023)   [employer] of Occupational Health - Occupational Stress Questionnaire    Feeling of Stress : Not at all  Social Connections: Moderately Integrated (08/03/2023)   Social Connection and Isolation Panel [NHANES]    Frequency of Communication with Friends and Family: More than three times a week    Frequency of Social Gatherings with Friends and Family: Twice a week    Attends Religious Services: More than 4 times per year    Active Member of [employer] or Organizations: Yes    Attends Banker  Meetings: 1 to 4 times per year    Marital Status: Widowed  Recent Concern: Social Connections - Moderately Isolated (06/21/2023)   Social Connection and Isolation Panel [NHANES]    Frequency of Communication with Friends and Family: More than three times a week    Frequency of Social Gatherings with Friends and Family: Twice a week    Attends Religious Services: More than 4 times per year    Active Member of [employer] or Organizations: No    Attends Banker Meetings: Not on file    Marital Status: Widowed  Intimate Partner Violence: Not At Risk (08/03/2023)   Humiliation, Afraid, Rape, and Kick questionnaire    Fear of Current or Ex-Partner: No    Emotionally Abused: No    Physically Abused: No    Sexually Abused: No    FAMILY HISTORY: Family History  Problem Relation Age of Onset   Hypertension Mother    Cancer Sister    Ovarian cancer Sister    Diabetes Maternal Aunt    Heart attack Paternal Aunt    Cancer Paternal Aunt  stomach   Breast cancer Other    Ovarian cancer Cousin     ALLERGIES:  has no known allergies.  MEDICATIONS:  Current Outpatient Medications  Medication Sig Dispense Refill   atorvastatin (LIPITOR) 80 MG tablet TAKE 1 TABLET BY MOUTH DAILY 100 tablet 2   metFORMIN (GLUCOPHAGE-XR) 750 MG 24 hr tablet Take 1 tablet (750 mg total) by mouth daily with breakfast. 100 tablet 2   metoprolol succinate (TOPROL-XL) 100 MG 24 hr tablet TAKE 1 TABLET BY MOUTH DAILY 100 tablet 2   Olmesartan-amLODIPine-HCTZ 20-5-12.5 MG TABS TAKE 1 TABLET BY MOUTH DAILY 100 tablet 2   potassium chloride SA (KLOR-CON M) 20 MEQ tablet Take 1 tablet (20 mEq total) by mouth daily. 100 tablet 2   zolpidem (AMBIEN) 10 MG tablet TAKE 1/2 TO 1 TABLET(5 TO 10 MG) BY MOUTH AT BEDTIME 90 tablet 1   Blood Glucose Monitoring Suppl (ONETOUCH VERIO FLEX SYSTEM) w/Device KIT Check fasting blood sugar every morning. DM ICD10 E11.9 (Patient not taking: Reported on 08/11/2023) 1 kit Prn    clindamycin (CLINDAGEL) 1 % gel Apply topically. (Patient not taking: Reported on 08/11/2023)     diclofenac Sodium (VOLTAREN) 1 % GEL APPLY 4 GRAMS TOPICALLY 4 TIMES  DAILY TO LOWER EXTREMITY JOINT.  MANUFACTURER RECOMMENDS NOT  EXCEEDING 32 GRAMS/DAY (Patient not taking: Reported on 08/11/2023) 350 g 1   hydroquinone 4 % cream Apply topically 2 (two) times daily. (Patient not taking: Reported on 08/11/2023)     Lancets (ONETOUCH DELICA PLUS LANCET33G) MISC CHECK BLOOD SUGAR TWICE DAILY (Patient not taking: Reported on 08/11/2023) 200 each 2   ONETOUCH VERIO test strip CHECK BLOOD GLUCOSE TWICE DAILY  AS DIRECTED AS NEEDED (Patient not taking: Reported on 08/11/2023) 200 strip 2   spironolactone (ALDACTONE) 25 MG tablet TAKE 1 TABLET BY MOUTH TWICE  DAILY (Patient not taking: Reported on 08/11/2023) 200 tablet 2   tretinoin (RETIN-A) 0.1 % cream Apply topically daily. (Patient not taking: Reported on 08/11/2023)     triamcinolone cream (KENALOG) 0.1 % Apply 1 Application topically 2 (two) times daily. (Patient not taking: Reported on 08/11/2023) 30 g 0   No current facility-administered medications for this visit.    REVIEW OF SYSTEMS:   Constitutional: Denies fevers, chills or abnormal night sweats Breast:  Denies any palpable lumps or discharge All other systems were reviewed with the patient and are negative.  PHYSICAL EXAMINATION: ECOG PERFORMANCE STATUS: 0 - Asymptomatic  Vitals:   08/11/23 1250  BP: (!) 150/75  Pulse: 74  Resp: 16  Temp: (!) 97.3 F (36.3 C)  SpO2: 98%   Filed Weights   08/11/23 1250  Weight: 155 lb 9.6 oz (70.6 kg)    GENERAL:alert, no distress and comfortable    LABORATORY DATA:  I have reviewed the data as listed Lab Results  Component Value Date   WBC 5.4 08/11/2023   HGB 13.2 08/11/2023   HCT 37.7 08/11/2023   MCV 74.5 (L) 08/11/2023   PLT 303 08/11/2023   Lab Results  Component Value Date   NA 139 08/11/2023   K 4.0 08/11/2023   CL 104  08/11/2023   CO2 29 08/11/2023    RADIOGRAPHIC STUDIES: I have personally reviewed the radiological reports and agreed with the findings in the report.  ASSESSMENT AND PLAN:  Malignant neoplasm of lower-inner quadrant of right breast of female, estrogen receptor positive (HCC) 08/03/2023:Screening mammogram detected right breast mass with calcifications LIQ posterior at 5:00: 1.9 cm ultrasound biopsy:  Grade 2 IDC with intermediate grade DCIS with necrosis axilla negative, ER 95%, PR 95%, Ki67 10%, HER2 negative by [facility]  Pathology and radiology counseling:Discussed with the patient, the details of pathology including the type of breast cancer,the clinical staging, the significance of ER, PR and HER-2/neu receptors and the implications for treatment. After reviewing the pathology in detail, we proceeded to discuss the different treatment options between surgery, radiation, chemotherapy, antiestrogen therapies.  Recommendations: 1. Breast conserving surgery followed by 2. Oncotype DX testing to determine if chemotherapy would be of any benefit followed by 3. Adjuvant radiation therapy followed by 4. Adjuvant antiestrogen therapy  Oncotype counseling: I discussed Oncotype DX test. I explained to the patient that this is a 21 gene panel to evaluate patient tumors DNA to calculate recurrence score. This would help determine whether patient has high risk or low risk breast cancer. She understands that if her tumor was found to be high risk, she would benefit from systemic chemotherapy. If low risk, no need of chemotherapy.  Return to clinic after surgery to discuss final pathology report and then determine if Oncotype DX testing will need to be sent.      All questions were answered. The patient knows to call the clinic with any problems, questions or concerns.    Tamsen Meek, MD 08/11/23

## 2023-08-11 NOTE — Assessment & Plan Note (Signed)
08/03/2023:Screening mammogram detected right breast mass with calcifications LIQ posterior at 5:00: 1.9 cm ultrasound biopsy: Grade 2 IDC with intermediate grade DCIS with necrosis axilla negative, ER 95%, PR 95%, Ki67 10%, HER2 negative by [facility]  Pathology and radiology counseling:Discussed with the patient, the details of pathology including the type of breast cancer,the clinical staging, the significance of ER, PR and HER-2/neu receptors and the implications for treatment. After reviewing the pathology in detail, we proceeded to discuss the different treatment options between surgery, radiation, chemotherapy, antiestrogen therapies.  Recommendations: 1. Breast conserving surgery followed by 2. Oncotype DX testing to determine if chemotherapy would be of any benefit followed by 3. Adjuvant radiation therapy followed by 4. Adjuvant antiestrogen therapy  Oncotype counseling: I discussed Oncotype DX test. I explained to the patient that this is a 21 gene panel to evaluate patient tumors DNA to calculate recurrence score. This would help determine whether patient has high risk or low risk breast cancer. She understands that if her tumor was found to be high risk, she would benefit from systemic chemotherapy. If low risk, no need of chemotherapy.  Return to clinic after surgery to discuss final pathology report and then determine if Oncotype DX testing will need to be sent.

## 2023-08-11 NOTE — Progress Notes (Signed)
[facility] Multidisciplinary Clinic Spiritual Care Note  Met with Bonnie Torres, her daughter, and her friend Bonnie Torres in Breast Multidisciplinary Clinic to introduce Support Center team/resources.  She completed SDOH screening; results follow below.  SDOH Interventions    Flowsheet Row Clinical Support from 08/03/2023 in [facility] Primary Care & Sports Medicine at [facility] Office Visit from 12/18/2022 in [facility] Primary Care & Sports Medicine at [facility] Office Visit from 10/28/2022 in [facility] Primary Care & Sports Medicine at [facility] Office Visit from 07/21/2021 in [facility] Primary Care & Sports Medicine at [facility] Chronic Care Management from 01/22/2021 in [facility] Primary Care & Sports Medicine at [facility]  SDOH Interventions       Food Insecurity Interventions Intervention Not Indicated -- -- -- Intervention Not Indicated  Housing Interventions Intervention Not Indicated -- -- Intervention Not Indicated --  Transportation Interventions Intervention Not Indicated -- -- -- Intervention Not Indicated  Utilities Interventions Intervention Not Indicated -- -- -- --  Alcohol Usage Interventions Intervention Not Indicated (Score <7) -- -- -- --  Depression Interventions/Treatment  -- Medication Counseling PHQ2-9 Score <4 Follow-up Not Indicated --  Financial Strain Interventions Intervention Not Indicated -- -- Intervention Not Indicated --  Physical Activity Interventions Intervention Not Indicated -- -- Intervention Not Indicated --  Stress Interventions Intervention Not Indicated -- -- Intervention Not Indicated --  Social Connections Interventions Intervention Not Indicated -- -- Intervention Not Indicated --  Health Literacy Interventions Intervention Not Indicated -- -- -- --       SDOH Screenings   Food Insecurity: No Food Insecurity (08/11/2023)  Housing: Unknown (08/11/2023)  Transportation Needs: No Transportation Needs  (08/11/2023)  Utilities: Not At Risk (08/11/2023)  Alcohol Screen: Low Risk  (08/03/2023)  Depression (PHQ2-9): Low Risk  (08/03/2023)  Financial Resource Strain: Low Risk  (08/03/2023)  Physical Activity: Insufficiently Active (08/03/2023)  Social Connections: Moderately Integrated (08/03/2023)  Recent Concern: Social Connections - Moderately Isolated (06/21/2023)  Stress: No Stress Concern Present (08/03/2023)  Tobacco Use: Low Risk  (08/11/2023)  Health Literacy: Adequate Health Literacy (08/03/2023)    Chaplain and patient discussed common feelings and emotions when being diagnosed with cancer, and the importance of support during treatment.  Chaplain informed patient of the support team and support services at [facility].  Chaplain provided contact information and encouraged patient to call with any questions or concerns.  Bonnie Torres and her support crew were relieved after meeting team and learning about scope of diagnosis and treatment in [facility]. They were very impressed with the clinic model and their engaged, compassionate whole-person care.   Follow up needed: Yes.  Ms Eckels welcomes a follow-up call in ca two weeks for Spiritual Care check-in. Plan to revisit interest in [employer] peer mentor at that time.   [address] Rush Barer, [place], [facility] Pager [phone] Voicemail [phone]

## 2023-08-12 ENCOUNTER — Other Ambulatory Visit: Payer: Self-pay | Admitting: General Surgery

## 2023-08-12 ENCOUNTER — Telehealth: Payer: Self-pay | Admitting: Hematology and Oncology

## 2023-08-12 ENCOUNTER — Other Ambulatory Visit: Payer: Self-pay | Admitting: Physician Assistant

## 2023-08-12 DIAGNOSIS — C50311 Malignant neoplasm of lower-inner quadrant of right female breast: Secondary | ICD-10-CM

## 2023-08-12 DIAGNOSIS — N1831 Chronic kidney disease, stage 3a: Secondary | ICD-10-CM

## 2023-08-12 NOTE — Telephone Encounter (Signed)
[name]

## 2023-08-17 ENCOUNTER — Encounter: Payer: Self-pay | Admitting: Genetic Counselor

## 2023-08-17 ENCOUNTER — Telehealth: Payer: Self-pay | Admitting: Genetic Counselor

## 2023-08-17 DIAGNOSIS — Z1379 Encounter for other screening for genetic and chromosomal anomalies: Secondary | ICD-10-CM | POA: Insufficient documentation

## 2023-08-17 NOTE — Telephone Encounter (Signed)
Contacted patient in attempt to disclose results of genetic testing (negative BRCAPlus).  LVM with contact information requesting a call back.

## 2023-08-17 NOTE — Telephone Encounter (Signed)
Discussed negative BRCAPlus.  Pan-cancer panel is pending.

## 2023-08-18 ENCOUNTER — Encounter: Payer: Self-pay | Admitting: *Deleted

## 2023-08-23 ENCOUNTER — Ambulatory Visit: Payer: Self-pay | Admitting: Genetic Counselor

## 2023-08-23 DIAGNOSIS — Z8041 Family history of malignant neoplasm of ovary: Secondary | ICD-10-CM

## 2023-08-23 DIAGNOSIS — C50311 Malignant neoplasm of lower-inner quadrant of right female breast: Secondary | ICD-10-CM

## 2023-08-23 DIAGNOSIS — Z1379 Encounter for other screening for genetic and chromosomal anomalies: Secondary | ICD-10-CM

## 2023-08-23 DIAGNOSIS — Z803 Family history of malignant neoplasm of breast: Secondary | ICD-10-CM

## 2023-08-23 DIAGNOSIS — Z85528 Personal history of other malignant neoplasm of kidney: Secondary | ICD-10-CM

## 2023-08-23 NOTE — Progress Notes (Signed)
 HPI:   Ms. Furukawa was previously seen in the  [facility] due to a personal history of breast and bilateral kidney cancer and concerns regarding a hereditary predisposition to cancer.    Ms. Esker recent genetic test results were disclosed to her by telephone. These results and recommendations are discussed in more detail below.  CANCER HISTORY:  Oncology History  Malignant neoplasm of lower-inner quadrant of right breast of female, estrogen receptor positive (HCC)  08/03/2023 Initial Diagnosis   Screening mammogram detected right breast mass with calcifications LIQ posterior at 5:00: 1.9 cm ultrasound biopsy: Grade 2 IDC with intermediate grade DCIS with necrosis axilla negative, ER 95%, PR 95%, Ki67 10%, HER2 negative by [facility]   08/11/2023 Cancer Staging   Staging form: Breast, AJCC 8th Edition - Clinical stage from 08/11/2023: Stage IA (cT1c, cN0, cM0, G2, ER+, PR+, HER2-) - Signed by Serena Croissant, MD on 08/11/2023 Stage prefix: Initial diagnosis Histologic grading system: 3 grade system   08/20/2023 Genetic Testing   Negative [facility] CancerNext-Expanded +RNAinsight Panel.  VUS in POLE at p.M1212V (c.3634A>G).  Report date is 08/20/2023.   The CancerNext-Expanded gene panel offered by [facility] and includes sequencing, rearrangement, and RNA analysis for the following 76 genes: AIP, ALK, APC, ATM, AXIN2, BAP1, BARD1, BMPR1A, BRCA1, BRCA2, BRIP1, CDC73, CDH1, CDK4, CDKN1B, CDKN2A, CEBPA, CHEK2, CTNNA1, DDX41, DICER1, ETV6, FH, FLCN, GATA2, LZTR1, MAX, MBD4, MEN1, MET, MLH1, MSH2, MSH3, MSH6, MUTYH, NF1, NF2, NTHL1, PALB2, PHOX2B, PMS2, POT1, PRKAR1A, PTCH1, PTEN, RAD51C, RAD51D, RB1, RET, RUNX1, SDHA, SDHAF2, SDHB, SDHC, SDHD, SMAD4, SMARCA4, SMARCB1, SMARCE1, STK11, SUFU, TMEM127, TP53, TSC1, TSC2, VHL, and WT1 (sequencing and deletion/duplication); EGFR, HOXB13, KIT, MITF, PDGFRA, POLD1, and POLE (sequencing only); EPCAM and GREM1 (deletion/duplication only).       FAMILY HISTORY:  We obtained a detailed, 4-generation family history.  Significant diagnoses are listed below:      Family History  Problem Relation Age of Onset   Stomach cancer Paternal Aunt          dx 64s   Ovarian cancer Cousin 34   Breast cancer Niece     Ovarian cancer Half-Sister 46        mat half sister       Ms. Winslett is unaware of previous family history of genetic testing for hereditary cancer risks. There is no reported Ashkenazi Jewish ancestry. There is no known consanguinity.  Other relatives are unavailable for genetic testing at this time.  GENETIC TEST RESULTS:  The [facility] CancerNext-Expanded +RNAinsight Panel found no pathogenic mutations.   The CancerNext-Expanded gene panel offered by [facility] and includes sequencing, rearrangement, and RNA analysis for the following 76 genes: AIP, ALK, APC, ATM, AXIN2, BAP1, BARD1, BMPR1A, BRCA1, BRCA2, BRIP1, CDC73, CDH1, CDK4, CDKN1B, CDKN2A, CEBPA, CHEK2, CTNNA1, DDX41, DICER1, ETV6, FH, FLCN, GATA2, LZTR1, MAX, MBD4, MEN1, MET, MLH1, MSH2, MSH3, MSH6, MUTYH, NF1, NF2, NTHL1, PALB2, PHOX2B, PMS2, POT1, PRKAR1A, PTCH1, PTEN, RAD51C, RAD51D, RB1, RET, RUNX1, SDHA, SDHAF2, SDHB, SDHC, SDHD, SMAD4, SMARCA4, SMARCB1, SMARCE1, STK11, SUFU, TMEM127, TP53, TSC1, TSC2, VHL, and WT1 (sequencing and deletion/duplication); EGFR, HOXB13, KIT, MITF, PDGFRA, POLD1, and POLE (sequencing only); EPCAM and GREM1 (deletion/duplication only).  .   The test report has been scanned into EPIC and is located under the Molecular Pathology section of the Results Review tab.  A portion of the result report is included below for reference. Genetic testing reported out on August 20, 2023.     Genetic testing identified a  variant of uncertain significance (VUS) in the POLE gene called c.M1212V (c.3634A>G).  At this time, it is unknown if this variant is associated with an increased risk for cancer or if it is benign, but most uncertain variants  are reclassified to benign. It should not be used to make medical management decisions. With time, we suspect the laboratory will determine the significance of this variant, if any. If the laboratory reclassifies this variant, we will attempt to contact Ms. Frison to discuss it further.   Even though a pathogenic variant was not identified, possible explanations for the cancer in the family may include: There may be no hereditary risk for cancer in the family. The cancers in Ms. Fangman and/or her family may be sporadic/familial or due to other genetic and environmental factors.  Most cancer is not hereditary.  There may be a gene mutation in one of these genes that current testing methods cannot detect but that chance is small. There could be another gene that has not yet been discovered, or that we have not yet tested, that is responsible for the cancer diagnoses in the family.  It is also possible there is a hereditary cause for the cancer in the family that Ms. Bayley did not inherit.   Therefore, it is important to remain in touch with cancer genetics in the future so that we can continue to offer Ms. Kirkendall the most up to date genetic testing.    ADDITIONAL GENETIC TESTING:   Ms. Bonillas genetic testing was fairly extensive.  If there are additional relevant genes identified to increase cancer risk that can be analyzed in the future, we would be happy to discuss and coordinate this testing at that time.    CANCER SCREENING RECOMMENDATIONS:  Ms. Sample test result is considered negative (normal).  This means that we have not identified a hereditary cause for her personal history of breast or kidney at this time.   An individual's cancer risk and medical management are not determined by genetic test results alone. Overall cancer risk assessment incorporates additional factors, including personal medical history, family history, and any available genetic information that may result  in a personalized plan for cancer prevention and surveillance. Therefore, it is recommended she continue to follow the cancer management and screening guidelines provided by her oncology and primary healthcare provider.   RECOMMENDATIONS FOR FAMILY MEMBERS:   Since she did not inherit a identifiable mutation in a cancer predisposition gene included on this panel, her children could not have inherited a known mutation from her in one of these genes. Individuals in this family might be at some increased risk of developing cancer, over the general population risk, due to the family history of cancer.  Individuals in the family should notify their providers of the family history of cancer. We recommend women in this family have a yearly mammogram beginning at age 72, or 27 years younger than the earliest onset of cancer, an annual clinical breast exam, and perform monthly breast self-exams.  Risk models that take into account family history and hormonal history may be helpful in determining appropriate breast cancer screening options for family members.  Other members of the family may still carry a pathogenic variant in one of these genes that Ms. Vanderstelt did not inherit. Based on the family history, we recommend first degree relatives of those in the family with ovarian cancer (maternal half sister and paternal cousin) have genetic counseling and testing. Ms. Yurick can let us know if  we can be of any assistance in coordinating genetic counseling and/or testing for these family members.   We do not recommend familial testing for the POLE variant of uncertain significance (VUS).  FOLLOW-UP:  Cancer genetics is a rapidly advancing field and it is possible that new genetic tests will be appropriate for her and/or her family members in the future. We encourage Ms. Bolanos to remain in contact with cancer genetics, so we can update her personal and family histories and let her know of advances in cancer  genetics that may benefit this family.   Our contact number was provided.  They are welcome to call us at anytime with additional questions or concerns.   Yoshie Kosel M. Rennie Plowman, MS, [facility] Genetic Counselor [email or web address] (P) [phone]

## 2023-08-26 ENCOUNTER — Encounter: Payer: Self-pay | Admitting: General Practice

## 2023-08-26 NOTE — Progress Notes (Signed)
 [facility] Spiritual Care Note  Received and returned voicemail, encouraging another callback when available.   [address] Rush [place], [place], [facility] Pager [phone] Voicemail [phone]

## 2023-08-26 NOTE — Progress Notes (Signed)
 [facility] Spiritual Care Note  Left [facility] follow-up voicemail with direct number and encouragement to return call.   [address] Rush [place], [place], [facility] Pager [phone] Voicemail [phone]

## 2023-08-30 ENCOUNTER — Other Ambulatory Visit: Payer: Self-pay

## 2023-08-30 ENCOUNTER — Encounter (HOSPITAL_BASED_OUTPATIENT_CLINIC_OR_DEPARTMENT_OTHER): Payer: Self-pay | Admitting: General Surgery

## 2023-08-30 NOTE — Progress Notes (Signed)
   08/30/23 1434  PAT Phone Screen  Is the patient taking a GLP-1 receptor agonist? No  Do You Have Diabetes? Yes  Do You Have Hypertension? Yes  Have You Ever Been to the ER for Asthma? No  Have You Taken Oral Steroids in the Past 3 Months? No  Do you Take Phenteramine or any Other Diet Drugs? No  Recent  Lab Work, EKG, CXR? Yes  Where was this test performed? Cancer center  Do you have a history of heart problems? No  Any Recent Hospitalizations? No  Height 4\' 10"  (1.473 m)  Weight 70.8 kg  Pat Appointment Scheduled (S)  Yes (EKG)

## 2023-09-03 ENCOUNTER — Encounter (HOSPITAL_BASED_OUTPATIENT_CLINIC_OR_DEPARTMENT_OTHER)
Admission: RE | Admit: 2023-09-03 | Discharge: 2023-09-03 | Disposition: A | Discharge status: Home / Self Care | Source: Ambulatory Visit | Attending: General Surgery | Admitting: General Surgery

## 2023-09-03 ENCOUNTER — Ambulatory Visit
Admission: RE | Admit: 2023-09-03 | Discharge: 2023-09-03 | Disposition: A | Discharge status: Home / Self Care | Payer: 59 | Source: Ambulatory Visit | Attending: General Surgery | Admitting: General Surgery

## 2023-09-03 ENCOUNTER — Other Ambulatory Visit: Payer: Self-pay

## 2023-09-03 DIAGNOSIS — Z01810 Encounter for preprocedural cardiovascular examination: Secondary | ICD-10-CM | POA: Diagnosis not present

## 2023-09-03 DIAGNOSIS — M1990 Unspecified osteoarthritis, unspecified site: Secondary | ICD-10-CM | POA: Diagnosis not present

## 2023-09-03 DIAGNOSIS — Z170 Estrogen receptor positive status [ER+]: Secondary | ICD-10-CM | POA: Diagnosis not present

## 2023-09-03 DIAGNOSIS — C50511 Malignant neoplasm of lower-outer quadrant of right female breast: Secondary | ICD-10-CM | POA: Diagnosis not present

## 2023-09-03 DIAGNOSIS — G709 Myoneural disorder, unspecified: Secondary | ICD-10-CM | POA: Diagnosis not present

## 2023-09-03 DIAGNOSIS — Z1732 Human epidermal growth factor receptor 2 negative status: Secondary | ICD-10-CM | POA: Diagnosis not present

## 2023-09-03 DIAGNOSIS — Z7984 Long term (current) use of oral hypoglycemic drugs: Secondary | ICD-10-CM | POA: Diagnosis not present

## 2023-09-03 DIAGNOSIS — Z79899 Other long term (current) drug therapy: Secondary | ICD-10-CM | POA: Diagnosis not present

## 2023-09-03 DIAGNOSIS — E1122 Type 2 diabetes mellitus with diabetic chronic kidney disease: Secondary | ICD-10-CM | POA: Diagnosis not present

## 2023-09-03 DIAGNOSIS — Z1721 Progesterone receptor positive status: Secondary | ICD-10-CM | POA: Diagnosis not present

## 2023-09-03 DIAGNOSIS — I129 Hypertensive chronic kidney disease with stage 1 through stage 4 chronic kidney disease, or unspecified chronic kidney disease: Secondary | ICD-10-CM | POA: Diagnosis not present

## 2023-09-03 DIAGNOSIS — C50911 Malignant neoplasm of unspecified site of right female breast: Secondary | ICD-10-CM | POA: Diagnosis not present

## 2023-09-03 DIAGNOSIS — N189 Chronic kidney disease, unspecified: Secondary | ICD-10-CM | POA: Diagnosis not present

## 2023-09-03 DIAGNOSIS — N1831 Chronic kidney disease, stage 3a: Secondary | ICD-10-CM | POA: Insufficient documentation

## 2023-09-03 DIAGNOSIS — D0511 Intraductal carcinoma in situ of right breast: Secondary | ICD-10-CM | POA: Diagnosis not present

## 2023-09-03 HISTORY — PX: BREAST BIOPSY: SHX20

## 2023-09-03 MED ORDER — CHLORHEXIDINE GLUCONATE CLOTH 2 % EX PADS: 6.0000 | MEDICATED_PAD | Freq: Once | CUTANEOUS | Status: DC

## 2023-09-03 NOTE — Progress Notes (Signed)
 Reminded patient via text to come in to get an updated EKG for surgery scheduled 09/06/23 with Dr. Dwain Sarna.

## 2023-09-03 NOTE — Progress Notes (Signed)
 Surgical soap and presurgical drink given to patient, instructions given, patient verbalized understanding. EKG completed.

## 2023-09-06 ENCOUNTER — Other Ambulatory Visit: Payer: Self-pay

## 2023-09-06 ENCOUNTER — Encounter (HOSPITAL_BASED_OUTPATIENT_CLINIC_OR_DEPARTMENT_OTHER): Payer: Self-pay | Admitting: General Surgery

## 2023-09-06 ENCOUNTER — Ambulatory Visit (HOSPITAL_BASED_OUTPATIENT_CLINIC_OR_DEPARTMENT_OTHER): Admitting: Anesthesiology

## 2023-09-06 ENCOUNTER — Ambulatory Visit
Admission: RE | Admit: 2023-09-06 | Discharge: 2023-09-06 | Disposition: A | Discharge status: Home / Self Care | Payer: 59 | Source: Ambulatory Visit | Attending: General Surgery | Admitting: General Surgery

## 2023-09-06 ENCOUNTER — Ambulatory Visit (HOSPITAL_BASED_OUTPATIENT_CLINIC_OR_DEPARTMENT_OTHER)
Admission: RE | Admit: 2023-09-06 | Discharge: 2023-09-06 | Disposition: A | Discharge status: Home / Self Care | Payer: 59 | Attending: General Surgery | Admitting: General Surgery

## 2023-09-06 ENCOUNTER — Encounter (HOSPITAL_BASED_OUTPATIENT_CLINIC_OR_DEPARTMENT_OTHER)
Admission: RE | Disposition: A | Discharge status: Home / Self Care | Payer: Self-pay | Source: Home / Self Care | Attending: General Surgery

## 2023-09-06 DIAGNOSIS — C50511 Malignant neoplasm of lower-outer quadrant of right female breast: Secondary | ICD-10-CM | POA: Diagnosis not present

## 2023-09-06 DIAGNOSIS — C50311 Malignant neoplasm of lower-inner quadrant of right female breast: Secondary | ICD-10-CM

## 2023-09-06 DIAGNOSIS — E1122 Type 2 diabetes mellitus with diabetic chronic kidney disease: Secondary | ICD-10-CM | POA: Insufficient documentation

## 2023-09-06 DIAGNOSIS — N1832 Chronic kidney disease, stage 3b: Secondary | ICD-10-CM

## 2023-09-06 DIAGNOSIS — N641 Fat necrosis of breast: Secondary | ICD-10-CM | POA: Diagnosis not present

## 2023-09-06 DIAGNOSIS — Z170 Estrogen receptor positive status [ER+]: Secondary | ICD-10-CM | POA: Insufficient documentation

## 2023-09-06 DIAGNOSIS — Z1732 Human epidermal growth factor receptor 2 negative status: Secondary | ICD-10-CM | POA: Insufficient documentation

## 2023-09-06 DIAGNOSIS — G709 Myoneural disorder, unspecified: Secondary | ICD-10-CM | POA: Diagnosis not present

## 2023-09-06 DIAGNOSIS — N189 Chronic kidney disease, unspecified: Secondary | ICD-10-CM | POA: Insufficient documentation

## 2023-09-06 DIAGNOSIS — M1990 Unspecified osteoarthritis, unspecified site: Secondary | ICD-10-CM | POA: Insufficient documentation

## 2023-09-06 DIAGNOSIS — Z1721 Progesterone receptor positive status: Secondary | ICD-10-CM | POA: Insufficient documentation

## 2023-09-06 DIAGNOSIS — Z7984 Long term (current) use of oral hypoglycemic drugs: Secondary | ICD-10-CM | POA: Insufficient documentation

## 2023-09-06 DIAGNOSIS — I129 Hypertensive chronic kidney disease with stage 1 through stage 4 chronic kidney disease, or unspecified chronic kidney disease: Secondary | ICD-10-CM | POA: Insufficient documentation

## 2023-09-06 DIAGNOSIS — Z79899 Other long term (current) drug therapy: Secondary | ICD-10-CM | POA: Diagnosis not present

## 2023-09-06 DIAGNOSIS — R921 Mammographic calcification found on diagnostic imaging of breast: Secondary | ICD-10-CM | POA: Diagnosis not present

## 2023-09-06 DIAGNOSIS — C50911 Malignant neoplasm of unspecified site of right female breast: Secondary | ICD-10-CM | POA: Diagnosis not present

## 2023-09-06 HISTORY — PX: BREAST LUMPECTOMY WITH RADIOACTIVE SEED LOCALIZATION: SHX6424

## 2023-09-06 HISTORY — PX: BREAST LUMPECTOMY: SHX2

## 2023-09-06 LAB — GLUCOSE, CAPILLARY
Glucose-Capillary: 109 mg/dL — ABNORMAL HIGH (ref 70–99)
Glucose-Capillary: 110 mg/dL — ABNORMAL HIGH (ref 70–99)

## 2023-09-06 SURGERY — BREAST LUMPECTOMY WITH RADIOACTIVE SEED LOCALIZATION
Anesthesia: General | Site: Breast | Laterality: Right

## 2023-09-06 MED ORDER — BUPIVACAINE HCL (PF) 0.25 % IJ SOLN
INTRAMUSCULAR | Status: AC
  Filled 2023-09-06: qty 30

## 2023-09-06 MED ORDER — BUPIVACAINE HCL (PF) 0.25 % IJ SOLN
INTRAMUSCULAR | Status: DC | PRN
  Administered 2023-09-06: 10 mL

## 2023-09-06 MED ORDER — FENTANYL CITRATE (PF) 100 MCG/2ML IJ SOLN
INTRAMUSCULAR | Status: AC
  Filled 2023-09-06: qty 2

## 2023-09-06 MED ORDER — FENTANYL CITRATE (PF) 100 MCG/2ML IJ SOLN
25.0000 ug | INTRAMUSCULAR | Status: DC | PRN
  Administered 2023-09-06: 25 ug via INTRAVENOUS

## 2023-09-06 MED ORDER — CELECOXIB 200 MG PO CAPS
200.0000 mg | ORAL_CAPSULE | Freq: Once | ORAL | Status: AC
  Administered 2023-09-06: 200 mg via ORAL

## 2023-09-06 MED ORDER — DEXAMETHASONE SODIUM PHOSPHATE 10 MG/ML IJ SOLN
INTRAMUSCULAR | Status: AC
  Filled 2023-09-06: qty 1

## 2023-09-06 MED ORDER — MIDAZOLAM HCL 2 MG/2ML IJ SOLN
INTRAMUSCULAR | Status: AC
  Filled 2023-09-06: qty 2

## 2023-09-06 MED ORDER — ONDANSETRON HCL 4 MG/2ML IJ SOLN
INTRAMUSCULAR | Status: DC | PRN
  Administered 2023-09-06: 4 mg via INTRAVENOUS

## 2023-09-06 MED ORDER — ACETAMINOPHEN 160 MG/5ML PO SOLN: 325.0000 mg | ORAL | Status: DC | PRN

## 2023-09-06 MED ORDER — LIDOCAINE 2% (20 MG/ML) 5 ML SYRINGE
INTRAMUSCULAR | Status: DC | PRN
  Administered 2023-09-06: 60 mg via INTRAVENOUS

## 2023-09-06 MED ORDER — PHENYLEPHRINE 80 MCG/ML (10ML) SYRINGE FOR IV PUSH (FOR BLOOD PRESSURE SUPPORT)
PREFILLED_SYRINGE | INTRAVENOUS | Status: AC
  Filled 2023-09-06: qty 10

## 2023-09-06 MED ORDER — PROPOFOL 10 MG/ML IV BOLUS
INTRAVENOUS | Status: DC | PRN
  Administered 2023-09-06: 150 mg via INTRAVENOUS

## 2023-09-06 MED ORDER — MIDAZOLAM HCL 5 MG/5ML IJ SOLN
INTRAMUSCULAR | Status: DC | PRN
  Administered 2023-09-06: 2 mg via INTRAVENOUS

## 2023-09-06 MED ORDER — PROPOFOL 500 MG/50ML IV EMUL
INTRAVENOUS | Status: DC | PRN
  Administered 2023-09-06: 200 ug/kg/min via INTRAVENOUS

## 2023-09-06 MED ORDER — ONDANSETRON HCL 4 MG/2ML IJ SOLN: 4.0000 mg | Freq: Once | INTRAMUSCULAR | Status: DC | PRN

## 2023-09-06 MED ORDER — LACTATED RINGERS IV SOLN: INTRAVENOUS | Status: DC

## 2023-09-06 MED ORDER — TRAMADOL HCL 50 MG PO TABS: 50.0000 mg | ORAL_TABLET | Freq: Four times a day (QID) | ORAL | 0 refills | Status: DC | PRN

## 2023-09-06 MED ORDER — ACETAMINOPHEN 325 MG PO TABS: 325.0000 mg | ORAL_TABLET | ORAL | Status: DC | PRN

## 2023-09-06 MED ORDER — ACETAMINOPHEN 500 MG PO TABS
1000.0000 mg | ORAL_TABLET | Freq: Once | ORAL | Status: AC
  Administered 2023-09-06: 1000 mg via ORAL

## 2023-09-06 MED ORDER — PHENYLEPHRINE HCL (PRESSORS) 10 MG/ML IV SOLN
INTRAVENOUS | Status: DC | PRN
Start: 2023-09-06 — End: 2023-09-06
  Administered 2023-09-06 (×3): 80 ug via INTRAVENOUS

## 2023-09-06 MED ORDER — ACETAMINOPHEN 500 MG PO TABS: 1000.0000 mg | ORAL_TABLET | ORAL | Status: DC

## 2023-09-06 MED ORDER — CEFAZOLIN SODIUM-DEXTROSE 2-4 GM/100ML-% IV SOLN
INTRAVENOUS | Status: AC
  Filled 2023-09-06: qty 100

## 2023-09-06 MED ORDER — ACETAMINOPHEN 500 MG PO TABS
ORAL_TABLET | ORAL | Status: AC
  Filled 2023-09-06: qty 2

## 2023-09-06 MED ORDER — ENSURE PRE-SURGERY PO LIQD
296.0000 mL | Freq: Once | ORAL | Status: DC
Start: 2023-09-07 — End: 2023-09-06

## 2023-09-06 MED ORDER — FENTANYL CITRATE (PF) 100 MCG/2ML IJ SOLN
INTRAMUSCULAR | Status: DC | PRN
  Administered 2023-09-06: 50 ug via INTRAVENOUS
  Administered 2023-09-06 (×2): 25 ug via INTRAVENOUS

## 2023-09-06 MED ORDER — OXYCODONE HCL 5 MG PO TABS: 5.0000 mg | ORAL_TABLET | Freq: Once | ORAL | Status: DC | PRN

## 2023-09-06 MED ORDER — CEFAZOLIN SODIUM-DEXTROSE 2-4 GM/100ML-% IV SOLN
2.0000 g | INTRAVENOUS | Status: AC
  Administered 2023-09-06: 2 g via INTRAVENOUS

## 2023-09-06 MED ORDER — CELECOXIB 200 MG PO CAPS
ORAL_CAPSULE | ORAL | Status: AC
  Filled 2023-09-06: qty 1

## 2023-09-06 MED ORDER — DEXAMETHASONE SODIUM PHOSPHATE 4 MG/ML IJ SOLN
INTRAMUSCULAR | Status: DC | PRN
  Administered 2023-09-06: 4 mg via INTRAVENOUS

## 2023-09-06 MED ORDER — ONDANSETRON HCL 4 MG/2ML IJ SOLN
INTRAMUSCULAR | Status: AC
  Filled 2023-09-06: qty 2

## 2023-09-06 MED ORDER — MEPERIDINE HCL 25 MG/ML IJ SOLN: 6.2500 mg | INTRAMUSCULAR | Status: DC | PRN

## 2023-09-06 MED ORDER — OXYCODONE HCL 5 MG/5ML PO SOLN: 5.0000 mg | Freq: Once | ORAL | Status: DC | PRN

## 2023-09-06 MED ORDER — LIDOCAINE 2% (20 MG/ML) 5 ML SYRINGE
INTRAMUSCULAR | Status: AC
  Filled 2023-09-06: qty 5

## 2023-09-06 MED ORDER — PROPOFOL 10 MG/ML IV BOLUS
INTRAVENOUS | Status: AC
  Filled 2023-09-06: qty 20

## 2023-09-06 SURGICAL SUPPLY — 50 items
APPLIER CLIP 9.375 MED OPEN (MISCELLANEOUS) IMPLANT
BINDER BREAST LRG (GAUZE/BANDAGES/DRESSINGS) IMPLANT
BINDER BREAST MEDIUM (GAUZE/BANDAGES/DRESSINGS) IMPLANT
BINDER BREAST XLRG (GAUZE/BANDAGES/DRESSINGS) IMPLANT
BINDER BREAST XXLRG (GAUZE/BANDAGES/DRESSINGS) IMPLANT
BLADE SURG 15 STRL LF DISP TIS (BLADE) ×1 IMPLANT
CANISTER SUC SOCK COL 7IN (MISCELLANEOUS) IMPLANT
CANISTER SUCT 1200ML W/VALVE (MISCELLANEOUS) IMPLANT
CHLORAPREP W/TINT 26 (MISCELLANEOUS) ×1 IMPLANT
CLIP APPLIE 9.375 MED OPEN (MISCELLANEOUS) IMPLANT
CLIP TI WIDE RED SMALL 6 (CLIP) IMPLANT
COVER BACK TABLE 60X90IN (DRAPES) ×1 IMPLANT
COVER MAYO STAND STRL (DRAPES) ×1 IMPLANT
COVER PROBE CYLINDRICAL 5X96 (MISCELLANEOUS) ×1 IMPLANT
DERMABOND ADVANCED .7 DNX12 (GAUZE/BANDAGES/DRESSINGS) ×1 IMPLANT
DRAPE LAPAROSCOPIC ABDOMINAL (DRAPES) ×1 IMPLANT
DRAPE UTILITY XL STRL (DRAPES) ×1 IMPLANT
DRSG TEGADERM 4X4.75 (GAUZE/BANDAGES/DRESSINGS) IMPLANT
ELECT COATED BLADE 2.86 ST (ELECTRODE) ×1 IMPLANT
ELECT REM PT RETURN 9FT ADLT (ELECTROSURGICAL) ×1 IMPLANT
ELECTRODE REM PT RTRN 9FT ADLT (ELECTROSURGICAL) ×1 IMPLANT
GAUZE SPONGE 4X4 12PLY STRL LF (GAUZE/BANDAGES/DRESSINGS) IMPLANT
GLOVE BIO SURGEON STRL SZ 6.5 (GLOVE) IMPLANT
GLOVE BIO SURGEON STRL SZ7 (GLOVE) ×2 IMPLANT
GLOVE BIOGEL PI IND STRL 6.5 (GLOVE) IMPLANT
GLOVE BIOGEL PI IND STRL 7.5 (GLOVE) ×1 IMPLANT
GOWN STRL REUS W/ TWL LRG LVL3 (GOWN DISPOSABLE) ×2 IMPLANT
HEMOSTAT ARISTA ABSORB 3G PWDR (HEMOSTASIS) IMPLANT
KIT MARKER MARGIN INK (KITS) ×1 IMPLANT
NDL HYPO 25X1 1.5 SAFETY (NEEDLE) ×1 IMPLANT
NEEDLE HYPO 25X1 1.5 SAFETY (NEEDLE) ×1 IMPLANT
NS IRRIG 1000ML POUR BTL (IV SOLUTION) IMPLANT
PACK BASIN DAY SURGERY FS (CUSTOM PROCEDURE TRAY) ×1 IMPLANT
PENCIL SMOKE EVACUATOR (MISCELLANEOUS) ×1 IMPLANT
RETRACTOR ONETRAX LX 90X20 (MISCELLANEOUS) IMPLANT
SLEEVE SCD COMPRESS KNEE MED (STOCKING) ×1 IMPLANT
SPIKE FLUID TRANSFER (MISCELLANEOUS) IMPLANT
SPONGE T-LAP 4X18 ~~LOC~~+RFID (SPONGE) ×1 IMPLANT
STRIP CLOSURE SKIN 1/2X4 (GAUZE/BANDAGES/DRESSINGS) ×1 IMPLANT
SUT MNCRL AB 4-0 PS2 18 (SUTURE) ×1 IMPLANT
SUT MON AB 5-0 PS2 18 (SUTURE) IMPLANT
SUT SILK 2 0 SH (SUTURE) IMPLANT
SUT VIC AB 2-0 SH 27XBRD (SUTURE) ×1 IMPLANT
SUT VIC AB 3-0 SH 27X BRD (SUTURE) ×1 IMPLANT
SUT VIC AB 5-0 PS2 18 (SUTURE) IMPLANT
SYR CONTROL 10ML LL (SYRINGE) ×1 IMPLANT
TOWEL GREEN STERILE FF (TOWEL DISPOSABLE) ×1 IMPLANT
TRAY FAXITRON CT DISP (TRAY / TRAY PROCEDURE) ×1 IMPLANT
TUBE CONNECTING 20X1/4 (TUBING) IMPLANT
YANKAUER SUCT BULB TIP NO VENT (SUCTIONS) IMPLANT

## 2023-09-06 NOTE — H&P (Signed)
 63 yof with prior renal cell cancer who presents after screening mm that shows right sided mass and calcifications. This is in liq and measures 1.9x1.6x0.9 cm with mass bracketed by calcs. Axillary [place] is negative. Biopsy is grade II IDC with DCIS that is 95% er/pr pos, her 2 negative and Ki is 10%. She has prior right RC repair. No mass or dc  Review of Systems: A complete review of systems was obtained from the patient. I have reviewed this information and discussed as appropriate with the patient. See HPI as well for other ROS. Cardiovascular: Positive for palpitations.  All other systems reviewed and are negative.  Medical History: Past Medical History:  Diagnosis Date  Arthritis  Chronic kidney disease  Diabetes mellitus without complication (CMS/HHS-HCC)  History of cancer  Hyperlipidemia  Hypertension   Past Surgical History:  Procedure Laterality Date  NEPHRECTOMY  2021   No Known Allergies  Current Outpatient Medications on File Prior to Visit  Medication Sig Dispense Refill  atorvastatin (LIPITOR) 80 MG tablet Take 1 tablet by mouth once daily  metFORMIN (GLUCOPHAGE-XR) 750 MG XR tablet Take 750 mg by mouth  metoprolol SUCCinate (TOPROL-XL) 100 MG XL tablet Take 1 tablet by mouth once daily  olmesartan-amLODIPin-hcthiazid 20-5-12.5 mg Take 1 tablet by mouth once daily  potassium chloride (KLOR-CON M20) 20 MEQ ER tablet Take 1 tablet by mouth once daily  spironolactone (ALDACTONE) 25 MG tablet Take 1 tablet by mouth 2 (two) times daily  zolpidem (AMBIEN) 10 mg tablet TAKE 1/2 TO 1 TABLET(5 TO 10 MG) BY MOUTH AT BEDTIME   Family History  Problem Relation Age of Onset  High blood pressure (Hypertension) Mother  Hyperlipidemia (Elevated cholesterol) Mother    Social History   Tobacco Use  Smoking Status Never  Smokeless Tobacco Never  Marital status: Widowed  Tobacco Use  Smoking status: Never  Smokeless tobacco: Never  Substance and Sexual Activity  Alcohol  use: Not Currently  Drug use: Not Currently    Objective:   Physical Exam Vitals reviewed.  Constitutional:  Appearance: Normal appearance.  Chest:  Breasts: Right: Mass (hematoma lower central breast) present. No inverted nipple, nipple discharge or skin change.  Left: No inverted nipple, mass, nipple discharge or skin change.  Lymphadenopathy:  Upper Body:  Right upper body: No supraclavicular or axillary adenopathy.  Left upper body: No supraclavicular or axillary adenopathy.  Neurological:  Mental Status: She is alert.    Assessment and Plan:   Malignant neoplasm of lower-outer quadrant of right breast of female, estrogen receptor positive (CMS/HHS-HCC)  Right breast seed guided lumpectomy, bracketed  We discussed the staging and pathophysiology of breast cancer. We discussed all of the different options for treatment for breast cancer including surgery, chemotherapy, radiation therapy, and antiestrogen therapy.  We discussed a sentinel lymph node biopsy in detail today with respect to SOUND and INSEMA. She would like to avoid sn biopsy and I think this is reasonable.   We discussed the options for treatment of the breast cancer which included bracketed lumpectomy versus a mastectomy. We discussed the performance of the lumpectomy with radioactive seed placement. We discussed a 5-10% chance of a positive margin requiring reexcision in the operating room. We also discussed that she will likely need radiation therapy if she undergoes lumpectomy. We discussed mastectomy and the postoperative care for that as well. Mastectomy can be followed by reconstruction. Most mastectomy patients will not need radiation therapy. We discussed that there is no difference in her survival  whether she undergoes lumpectomy with radiation therapy or antiestrogen therapy versus a mastectomy. There is also no real difference between her recurrence in the breast.  We discussed the risks of operation  including bleeding, infection, possible reoperation. She understands her further therapy will be based on what her stages at the time of her operation.

## 2023-09-06 NOTE — Transfer of Care (Signed)
 Immediate Anesthesia Transfer of Care Note  Patient: Bonnie Torres  Procedure(s) Performed: RIGHT BREAST SEED BRACKETED LUMPECTOMY (Right: Breast)  Patient Location: PACU  Anesthesia Type:General  Level of Consciousness: sedated  Airway & Oxygen Therapy: Patient Spontanous Breathing and Patient connected to face mask oxygen  Post-op Assessment: Report given to RN and Post -op Vital signs reviewed and stable  Post vital signs: Reviewed and stable  Last Vitals:  Vitals Value Taken Time  BP    Temp    Pulse    Resp    SpO2      Last Pain:  Vitals:   09/06/23 0751  TempSrc: Temporal  PainSc: 0-No pain      Patients Stated Pain Goal: 4 (09/06/23 0751)  Complications: No notable events documented.

## 2023-09-06 NOTE — Discharge Instructions (Addendum)
 [facility] Office Phone Number [phone]  POST OP INSTRUCTIONS Take 400 mg of ibuprofen every 8 hours or 650 mg tylenol every 6 hours for next 72 hours then as needed. Use ice several times daily also.  A prescription for pain medication may be given to you upon discharge.  Take your pain medication as prescribed, if needed.  If narcotic pain medicine is not needed, then you may take acetaminophen (Tylenol), naprosyn (Alleve) or ibuprofen (Advil) as needed. Take your usually prescribed medications unless otherwise directed If you need a refill on your pain medication, please contact your pharmacy.  They will contact our office to request authorization.  Prescriptions will not be filled after 5pm or on week-ends. You should eat very light the first 24 hours after surgery, such as soup, crackers, pudding, etc.  Resume your normal diet the day after surgery. Most patients will experience some swelling and bruising in the breast.  Ice packs and a good support bra will help.  Wear the breast binder provided or a sports bra for 72 hours day and night.  After that wear a sports bra during the day until you return to the office. Swelling and bruising can take several days to resolve.  It is common to experience some constipation if taking pain medication after surgery.  Increasing fluid intake and taking a stool softener will usually help or prevent this problem from occurring.  A mild laxative (Milk of Magnesia or Miralax) should be taken according to package directions if there are no bowel movements after 48 hours. I used skin glue on the incision, you may shower in 24 hours.  The glue will flake off over the next 2-3 weeks.  Any sutures or staples will be removed at the office during your follow-up visit. ACTIVITIES:  You may resume regular daily activities (gradually increasing) beginning the next day.  Wearing a good support bra or sports bra minimizes pain and swelling.  You may have  sexual intercourse when it is comfortable. You may drive when you no longer are taking prescription pain medication, you can comfortably wear a seatbelt, and you can safely maneuver your car and apply brakes. RETURN TO WORK:  ______________________________________________________________________________________ Bonita Quin should see your doctor in the office for a follow-up appointment approximately two weeks after your surgery.  Your doctor's nurse will typically make your follow-up appointment when she calls you with your pathology report.  Expect your pathology report 3-4 business days after your surgery.  You may call to check if you do not hear from [place] after three days. OTHER INSTRUCTIONS: _______________________________________________________________________________________________ _____________________________________________________________________________________________________________________________________ _____________________________________________________________________________________________________________________________________ _____________________________________________________________________________________________________________________________________  WHEN TO CALL DR WAKEFIELD: Fever over 101.0 Nausea and/or vomiting. Extreme swelling or bruising. Continued bleeding from incision. Increased pain, redness, or drainage from the incision.  The clinic staff is available to answer your questions during regular business hours.  Please don't hesitate to call and ask to speak to one of the nurses for clinical concerns.  If you have a medical emergency, go to the nearest emergency room or call 911.  A surgeon from [facility] is always on call at the hospital.  For further questions, please visit [email or web address]   Post Anesthesia Home Care Instructions  Activity: Get plenty of rest for the remainder of the day. A responsible individual must stay  with you for 24 hours following the procedure.  For the next 24 hours, DO NOT: -Drive a car -Advertising copywriter -Drink alcoholic beverages -Take any medication unless instructed by  your physician -Make any legal decisions or sign important papers.  Meals: Start with liquid foods such as gelatin or soup. Progress to regular foods as tolerated. Avoid greasy, spicy, heavy foods. If nausea and/or vomiting occur, drink only clear liquids until the nausea and/or vomiting subsides. Call your physician if vomiting continues.  Special Instructions/Symptoms: Your throat may feel dry or sore from the anesthesia or the breathing tube placed in your throat during surgery. If this causes discomfort, gargle with warm salt water. The discomfort should disappear within 24 hours.  Next dose of Tylenol or Ibuprofen can be taken today at 2pm if needed.

## 2023-09-06 NOTE — Anesthesia Preprocedure Evaluation (Addendum)
 Anesthesia Evaluation  Patient identified by MRN, date of birth, ID band Patient awake    Reviewed: Allergy & Precautions, NPO status , Patient's Chart, lab work & pertinent test results  History of Anesthesia Complications (+) PONV and history of anesthetic complications  Airway Mallampati: I  TM Distance: >3 FB Neck ROM: Full    Dental  (+) Dental Advisory Given, Missing, Partial Upper, Poor Dentition,    Pulmonary    Pulmonary exam normal        Cardiovascular hypertension, Pt. on medications Normal cardiovascular exam     Neuro/Psych  Neuromuscular disease    GI/Hepatic   Endo/Other  diabetes, Type 2    Renal/GU Renal disease     Musculoskeletal  (+) Arthritis , Osteoarthritis,    Abdominal   Peds  Hematology   Anesthesia Other Findings   Reproductive/Obstetrics                             Anesthesia Physical Anesthesia Plan  ASA: 3  Anesthesia Plan: General   Post-op Pain Management: Tylenol PO (pre-op)* and Celebrex PO (pre-op)*   Induction: Intravenous  PONV Risk Score and Plan: 4 or greater and Scopolamine patch - Pre-op, Ondansetron, Midazolam and Treatment may vary due to age or medical condition  Airway Management Planned: Oral ETT and LMA  Additional Equipment: None  Intra-op Plan:   Post-operative Plan: Extubation in OR  Informed Consent: I have reviewed the patients History and Physical, chart, labs and discussed the procedure including the risks, benefits and alternatives for the proposed anesthesia with the patient or authorized representative who has indicated his/her understanding and acceptance.       Plan Discussed with: CRNA and Anesthesiologist  Anesthesia Plan Comments:         Anesthesia Quick Evaluation

## 2023-09-06 NOTE — Op Note (Signed)
 Preoperative diagnosis: Clinical stage I right breast cancer Postoperative diagnosis: Same as above Procedure: Right breast radioactive seed bracketed lumpectomy Surgeon: Dr. Harden Mo Anesthesia: General Estimated blood loss: Less than 50 cc Complications: None Drains: None Specimens: 1.  Right breast tissue containing 2 seeds and the clip 2.  Additional lateral, inferior, posterior, anterior margins marked short superior, long lateral, double deep Sponge needle count was correct completion Disposition to recovery in stable condition  Indications: This is a 68 year old female with a 1.9 cm mass found in the right breast on screening mammography.  Axillary ultrasound is negative.  This is a grade 2 invasive ductal carcinoma that is ER/PR positive.  We discussed proceeding with lumpectomy alone.  Procedure: After informed consent was obtained she was taken to the operating room.  She was given antibiotics.  SCDs in place.  She had had 2 seeds placed to bracket the calcifications in the mass prior to beginning.  I had these mammograms available in the operating room.  She was placed under general anesthesia without complication.  She was prepped and draped in a standard sterile surgical fashion.  A surgical timeout was then performed.  I made a periareolar incision after infiltrating Marcaine.  I used the neoprobe to excise both of the seeds in an attempt to get a rim of normal tissue around them.  This was still in a fairly large hematoma making this difficult.  I confirmed removal of both seeds and the clip.  I did think on the 3D images I might be close to other margin so I remove these as above.  Hemostasis was then obtained.  I closed the breast tissue now with 2-0 Vicryl.  The skin was closed with 3-0 Vicryl and 5-0 Monocryl.  Glue and Steri-Strips were applied.  She tolerated this well was extubated and transferred to recovery stable.

## 2023-09-06 NOTE — Anesthesia Procedure Notes (Signed)
 Procedure Name: LMA Insertion Date/Time: 09/06/2023 9:46 AM  Performed by: Burna Cash, CRNAPre-anesthesia Checklist: Patient identified, Emergency Drugs available, Suction available and Patient being monitored Patient Re-evaluated:Patient Re-evaluated prior to induction Oxygen Delivery Method: Circle system utilized Preoxygenation: Pre-oxygenation with 100% oxygen Induction Type: IV induction Ventilation: Mask ventilation without difficulty LMA: LMA inserted LMA Size: 4.0 Number of attempts: 1 Airway Equipment and Method: Bite block Placement Confirmation: positive ETCO2 Tube secured with: Tape Dental Injury: Teeth and Oropharynx as per pre-operative assessment

## 2023-09-06 NOTE — Anesthesia Postprocedure Evaluation (Signed)
 Anesthesia Post Note  Patient: Bonnie Torres  Procedure(s) Performed: RIGHT BREAST SEED BRACKETED LUMPECTOMY (Right: Breast)     Patient location during evaluation: PACU Anesthesia Type: General Level of consciousness: awake and alert Pain management: pain level controlled Vital Signs Assessment: post-procedure vital signs reviewed and stable Respiratory status: spontaneous breathing, nonlabored ventilation, respiratory function stable and patient connected to nasal cannula oxygen Cardiovascular status: blood pressure returned to baseline and stable Postop Assessment: no apparent nausea or vomiting Anesthetic complications: no   No notable events documented.  Last Vitals:  Vitals:   09/06/23 1130 09/06/23 1159  BP: 127/79 123/82  Pulse: 74 79  Resp: 13 20  Temp:  (!) 36.3 C  SpO2: 95% 94%    Last Pain:  Vitals:   09/06/23 1159  TempSrc: Temporal  PainSc: 2    Pain Goal: Patients Stated Pain Goal: 4 (09/06/23 1115)                 Kaicen Desena

## 2023-09-06 NOTE — Interval H&P Note (Signed)
 History and Physical Interval Note:  09/06/2023 9:22 AM  Bonnie Torres  has presented today for surgery, with the diagnosis of RIGHT BREAST CANCER.  The various methods of treatment have been discussed with the patient and family. After consideration of risks, benefits and other options for treatment, the patient has consented to  Procedure(s): RIGHT BREAST SEED BRACKETED LUMPECTOMY (Right) as a surgical intervention.  The patient's history has been reviewed, patient examined, no change in status, stable for surgery.  I have reviewed the patient's chart and labs.  Questions were answered to the patient's satisfaction.     [name]

## 2023-09-07 ENCOUNTER — Encounter (HOSPITAL_BASED_OUTPATIENT_CLINIC_OR_DEPARTMENT_OTHER): Payer: Self-pay | Admitting: General Surgery

## 2023-09-07 DIAGNOSIS — C50911 Malignant neoplasm of unspecified site of right female breast: Secondary | ICD-10-CM | POA: Diagnosis not present

## 2023-09-08 LAB — SURGICAL PATHOLOGY

## 2023-09-09 ENCOUNTER — Telehealth: Payer: Self-pay | Admitting: *Deleted

## 2023-09-09 ENCOUNTER — Encounter: Payer: Self-pay | Admitting: *Deleted

## 2023-09-09 NOTE — Telephone Encounter (Signed)
 Received order for Oncotype Testing per Dr. Pamelia Hoit. Requisition faxed to pathology and [facility]

## 2023-09-13 DIAGNOSIS — Z85528 Personal history of other malignant neoplasm of kidney: Secondary | ICD-10-CM | POA: Diagnosis not present

## 2023-09-15 DIAGNOSIS — Z170 Estrogen receptor positive status [ER+]: Secondary | ICD-10-CM | POA: Diagnosis not present

## 2023-09-15 DIAGNOSIS — C50311 Malignant neoplasm of lower-inner quadrant of right female breast: Secondary | ICD-10-CM | POA: Diagnosis not present

## 2023-09-15 DIAGNOSIS — L680 Hirsutism: Secondary | ICD-10-CM | POA: Diagnosis not present

## 2023-09-15 DIAGNOSIS — L700 Acne vulgaris: Secondary | ICD-10-CM | POA: Diagnosis not present

## 2023-09-15 DIAGNOSIS — L2089 Other atopic dermatitis: Secondary | ICD-10-CM | POA: Diagnosis not present

## 2023-09-20 ENCOUNTER — Encounter: Payer: Self-pay | Admitting: *Deleted

## 2023-09-20 ENCOUNTER — Ambulatory Visit (INDEPENDENT_AMBULATORY_CARE_PROVIDER_SITE_OTHER): Payer: 59 | Admitting: Physician Assistant

## 2023-09-20 VITALS — BP 128/80 | HR 80 | Ht <= 58 in | Wt 157.0 lb

## 2023-09-20 DIAGNOSIS — N1831 Chronic kidney disease, stage 3a: Secondary | ICD-10-CM

## 2023-09-20 DIAGNOSIS — E876 Hypokalemia: Secondary | ICD-10-CM

## 2023-09-20 DIAGNOSIS — Z7984 Long term (current) use of oral hypoglycemic drugs: Secondary | ICD-10-CM | POA: Diagnosis not present

## 2023-09-20 DIAGNOSIS — Z170 Estrogen receptor positive status [ER+]: Secondary | ICD-10-CM

## 2023-09-20 DIAGNOSIS — F5101 Primary insomnia: Secondary | ICD-10-CM | POA: Diagnosis not present

## 2023-09-20 DIAGNOSIS — E1122 Type 2 diabetes mellitus with diabetic chronic kidney disease: Secondary | ICD-10-CM

## 2023-09-20 DIAGNOSIS — C50311 Malignant neoplasm of lower-inner quadrant of right female breast: Secondary | ICD-10-CM

## 2023-09-20 DIAGNOSIS — E785 Hyperlipidemia, unspecified: Secondary | ICD-10-CM

## 2023-09-20 LAB — POCT GLYCOSYLATED HEMOGLOBIN (HGB A1C): Hemoglobin A1C: 5.9 % — AB (ref 4.0–5.6)

## 2023-09-20 MED ORDER — ZOLPIDEM TARTRATE 10 MG PO TABS: ORAL_TABLET | ORAL | 1 refills | Status: DC

## 2023-09-20 MED ORDER — POTASSIUM CHLORIDE CRYS ER 20 MEQ PO TBCR: 20.0000 meq | EXTENDED_RELEASE_TABLET | Freq: Every day | ORAL | 2 refills | Status: AC

## 2023-09-20 NOTE — Progress Notes (Unsigned)
 Established Patient Office Visit  Subjective   Patient ID: Bonnie Torres, female    DOB: 1956/03/31  Age: 68 y.o. MRN: [id number]  Chief Complaint  Patient presents with   Medical Management of Chronic Issues    Type 2 diabetes mellitus with stage 3a chronic kidney disease, without long-term current use of insulin (HCC) last A1c is 6.0    HPI Pt is a 68 yo obese female with T2DM, CKD 3, HTN, hx of renal cell carcinoma, new dx of right breast cancer who presents to the clinic for 3 month follow up.   Pt is not checking BP at home. She is complaint with medications. Denies any CP, palpitations, headaches or vision changes.   She is not checking her sugars at home. On metformin only. She denies any hypoglycemic events. She is taking her lipitor and potassium daily.   She is being treated for right BC right now. She had lumpectomy 3/10. She will have 4 weeks of radiation. She is currently out of work.   Mood is good. Sleep is doing well with ambien. Overall no concerns.       Review of Systems  All other systems reviewed and are negative.     Objective:     BP 128/80   Pulse 80   Ht 4\' 10"  (1.473 m)   Wt 157 lb (71.2 kg)   SpO2 99%   BMI 32.81 kg/m  BP Readings from Last 3 Encounters:  09/20/23 128/80  09/06/23 123/82  08/11/23 (!) 150/75   Wt Readings from Last 3 Encounters:  09/20/23 157 lb (71.2 kg)  09/06/23 157 lb 10.1 oz (71.5 kg)  08/11/23 155 lb 9.6 oz (70.6 kg)      Physical Exam Constitutional:      Appearance: Normal appearance. She is obese.  HENT:     Head: Normocephalic.  Cardiovascular:     Rate and Rhythm: Normal rate and regular rhythm.  Pulmonary:     Effort: Pulmonary effort is normal.     Breath sounds: Normal breath sounds.  Musculoskeletal:     Cervical back: Normal range of motion and neck supple. No tenderness.     Right lower leg: No edema.     Left lower leg: No edema.  Lymphadenopathy:     Cervical: No cervical  adenopathy.  Neurological:     General: No focal deficit present.     Mental Status: She is alert and oriented to person, place, and time.  Psychiatric:        Mood and Affect: Mood normal.      Results for orders placed or performed in visit on 09/20/23  POCT HgB A1C  Result Value Ref Range   Hemoglobin A1C 5.9 (A) 4.0 - 5.6 %   HbA1c POC (<> result, manual entry)     HbA1c, POC (prediabetic range)     HbA1c, POC (controlled diabetic range)       The 10-year ASCVD risk score (Arnett DK, et al., 2019) is: 17.6%    Assessment & Plan:  Marland KitchenMarland KitchenFaith was seen today for medical management of chronic issues.  Diagnoses and all orders for this visit:  Type 2 diabetes mellitus with stage 3a chronic kidney disease, without long-term current use of insulin (HCC) -     POCT HgB A1C -     CMP14+EGFR  Hyperlipidemia LDL goal <70 -     Lipid panel -     CMP14+EGFR  Primary insomnia -  zolpidem (AMBIEN) 10 MG tablet; TAKE 1/2 TO 1 TABLET(5 TO 10 MG) BY MOUTH AT BEDTIME  Hypokalemia -     potassium chloride SA (KLOR-CON M) 20 MEQ tablet; Take 1 tablet (20 mEq total) by mouth daily.  Malignant neoplasm of lower-inner quadrant of right breast of female, estrogen receptor positive (HCC)   A1C to goal Continue on metformin BP to goal continue on medications to control On statin, recheck lipid today Will recheck kidney function Eye and foot exam UTD Vaccines UTD  Ambien refilled for insomnia.     Return in about 3 months (around 12/21/2023).    Tandy Gaw, PA-C

## 2023-09-21 ENCOUNTER — Encounter: Payer: Self-pay | Admitting: Physician Assistant

## 2023-09-21 ENCOUNTER — Encounter (HOSPITAL_COMMUNITY): Payer: Self-pay

## 2023-09-27 ENCOUNTER — Encounter: Payer: Self-pay | Admitting: *Deleted

## 2023-09-27 ENCOUNTER — Inpatient Hospital Stay: Payer: 59 | Attending: Internal Medicine | Admitting: Hematology and Oncology

## 2023-09-27 VITALS — BP 118/67 | HR 79 | Temp 97.7°F | Resp 18 | Ht <= 58 in | Wt 155.8 lb

## 2023-09-27 DIAGNOSIS — C50311 Malignant neoplasm of lower-inner quadrant of right female breast: Secondary | ICD-10-CM | POA: Diagnosis not present

## 2023-09-27 DIAGNOSIS — Z170 Estrogen receptor positive status [ER+]: Secondary | ICD-10-CM | POA: Insufficient documentation

## 2023-09-27 NOTE — Progress Notes (Signed)
 Patient Care Team: Nolene Ebbs as PCP - General (Family Medicine) Jobe Igo, OD (Optometry) Donnelly Angelica, RN as Oncology Nurse Navigator Pershing Proud, RN as Oncology Nurse Navigator [name], MD as Consulting Physician (General Surgery) Serena Croissant, MD as Consulting Physician (Hematology and Oncology) Antony Blackbird, MD as Consulting Physician (Radiation Oncology)  DIAGNOSIS:  Encounter Diagnosis  Name Primary?   Malignant neoplasm of lower-inner quadrant of right breast of female, estrogen receptor positive (HCC) Yes    SUMMARY OF ONCOLOGIC HISTORY: Oncology History  Malignant neoplasm of lower-inner quadrant of right breast of female, estrogen receptor positive (HCC)  08/03/2023 Initial Diagnosis   Screening mammogram detected right breast mass with calcifications LIQ posterior at 5:00: 1.9 cm ultrasound biopsy: Grade 2 IDC with intermediate grade DCIS with necrosis axilla negative, ER 95%, PR 95%, Ki67 10%, HER2 negative by [facility]   08/11/2023 Cancer Staging   Staging form: Breast, AJCC 8th Edition - Clinical stage from 08/11/2023: Stage IA (cT1c, cN0, cM0, G2, ER+, PR+, HER2-) - Signed by Serena Croissant, MD on 08/11/2023 Stage prefix: Initial diagnosis Histologic grading system: 3 grade system   08/20/2023 Genetic Testing   Negative [facility] CancerNext-Expanded +RNAinsight Panel.  VUS in POLE at p.M1212V (c.3634A>G).  Report date is 08/20/2023.   The CancerNext-Expanded gene panel offered by [facility] and includes sequencing, rearrangement, and RNA analysis for the following 76 genes: AIP, ALK, APC, ATM, AXIN2, BAP1, BARD1, BMPR1A, BRCA1, BRCA2, BRIP1, CDC73, CDH1, CDK4, CDKN1B, CDKN2A, CEBPA, CHEK2, CTNNA1, DDX41, DICER1, ETV6, FH, FLCN, GATA2, LZTR1, MAX, MBD4, MEN1, MET, MLH1, MSH2, MSH3, MSH6, MUTYH, NF1, NF2, NTHL1, PALB2, PHOX2B, PMS2, POT1, PRKAR1A, PTCH1, PTEN, RAD51C, RAD51D, RB1, RET, RUNX1, SDHA, SDHAF2, SDHB, SDHC, SDHD, SMAD4, SMARCA4,  SMARCB1, SMARCE1, STK11, SUFU, TMEM127, TP53, TSC1, TSC2, VHL, and WT1 (sequencing and deletion/duplication); EGFR, HOXB13, KIT, MITF, PDGFRA, POLD1, and POLE (sequencing only); EPCAM and GREM1 (deletion/duplication only).      CHIEF COMPLIANT: F/U after breast surgery  HISTORY OF PRESENT ILLNESS:  History of Present Illness The patient, with a history of ductal breast cancer, recently underwent surgery. The pathology report revealed that the cancer was slightly smaller than initially estimated at 1.9 cm. The report also indicated the presence of precancerous ductal carcinoma in situ (DCIS). All margins were negative, indicating that all cancerous and precancerous tissue was successfully removed during surgery. The cancer cells were found to be estrogen and progesterone receptor positive and HER2 negative, with a slow growth rate of 10%. The patient also has a history of an unspecified condition for which she is taking an anti-estrogen pill.     ALLERGIES:  has no known allergies.  MEDICATIONS:  Current Outpatient Medications  Medication Sig Dispense Refill   atorvastatin (LIPITOR) 80 MG tablet TAKE 1 TABLET BY MOUTH DAILY 100 tablet 2   metFORMIN (GLUCOPHAGE-XR) 750 MG 24 hr tablet TAKE 1 TABLET BY MOUTH DAILY  WITH BREAKFAST 100 tablet 2   metoprolol succinate (TOPROL-XL) 100 MG 24 hr tablet TAKE 1 TABLET BY MOUTH DAILY 100 tablet 2   Olmesartan-amLODIPine-HCTZ 20-5-12.5 MG TABS TAKE 1 TABLET BY MOUTH DAILY 100 tablet 2   potassium chloride SA (KLOR-CON M) 20 MEQ tablet Take 1 tablet (20 mEq total) by mouth daily. 100 tablet 2   zolpidem (AMBIEN) 10 MG tablet TAKE 1/2 TO 1 TABLET(5 TO 10 MG) BY MOUTH AT BEDTIME 90 tablet 1   No current facility-administered medications for this visit.    PHYSICAL EXAMINATION: ECOG PERFORMANCE  STATUS: 1 - Symptomatic but completely ambulatory  Vitals:   09/27/23 1023  BP: 118/67  Pulse: 79  Resp: 18  Temp: 97.7 F (36.5 C)  SpO2: 100%   Filed  Weights   09/27/23 1023  Weight: 155 lb 12.8 oz (70.7 kg)      LABORATORY DATA:  I have reviewed the data as listed    Latest Ref Rng & Units 08/11/2023   12:21 PM 05/24/2023    2:36 PM 03/22/2023    3:23 PM  CMP  Glucose 70 - 99 mg/dL 161  73  74   BUN 8 - 23 mg/dL 14  18  19    Creatinine 0.44 - 1.00 mg/dL 0.96  0.45  4.09   Sodium 135 - 145 mmol/L 139  141  141   Potassium 3.5 - 5.1 mmol/L 4.0  4.1  4.4   Chloride 98 - 111 mmol/L 104  103  104   CO2 22 - 32 mmol/L 29  22  19    Calcium 8.9 - 10.3 mg/dL 81.1  9.5  91.4   Total Protein 6.5 - 8.1 g/dL 8.1  6.8  7.5   Total Bilirubin 0.0 - 1.2 mg/dL 0.8  0.6  0.6   Alkaline Phos 38 - 126 U/L 85  91  89   AST 15 - 41 U/L 21  16  20    ALT 0 - 44 U/L 17  15  17      Lab Results  Component Value Date   WBC 5.4 08/11/2023   HGB 13.2 08/11/2023   HCT 37.7 08/11/2023   MCV 74.5 (L) 08/11/2023   PLT 303 08/11/2023   NEUTROABS 2.1 08/11/2023    ASSESSMENT & PLAN:  Malignant neoplasm of lower-inner quadrant of right breast of female, estrogen receptor positive (HCC) 08/03/2023:Screening mammogram detected right breast mass with calcifications LIQ posterior at 5:00: 1.9 cm ultrasound biopsy: Grade 2 IDC with intermediate grade DCIS with necrosis axilla negative, ER 95%, PR 95%, Ki67 10%, HER2 negative by FISH   Recommendations: 1.  Right lumpectomy: 09/06/2023: Grade 2 IDC 1.7 cm with DCIS, margins negative, LVI not identified, ER 95%, PR 95%, HER2 negative, Ki-67 10% 2. Oncotype DX testing to determine if chemotherapy would be of any benefit followed by 3. Adjuvant radiation therapy followed by 4. Adjuvant antiestrogen therapy ------------------------------------------------------------------------------------------------------------------------------------------------- Pathology counseling: I discussed the final pathology report of the patient provided  a copy of this report. I discussed the margins as well as lymph node surgeries. We  also discussed the final staging along with previously performed ER/PR and HER-2/neu testing.  Return to clinic based upon Oncotype DX test result  ------------------------------------- Assessment and Plan Assessment & Plan Malignant neoplasm of lower-inner quadrant of right breast, estrogen receptor positive Post-surgical status for ductal breast cancer is favorable. Tumor size is 1.9 cm with negative margins, indicating complete excision, including DCIS. No lymphovascular invasion. Cancer is estrogen receptor positive, progesterone receptor positive, and HER2 negative. Growth rate is 10%. Final stage is 1A. Approximately 80-90% of women with stage 1A do not require chemotherapy. Oncotype DX test requested to assess chemotherapy necessity. Score =25 indicates low risk, no chemotherapy needed. Score =26 indicates high risk, chemotherapy may be considered. - Await Oncotype DX test results - If Oncotype DX score is low risk, proceed with radiation therapy - Schedule follow-up appointment post-radiation therapy to discuss anti-estrogen therapy      No orders of the defined types were placed in this encounter.  The patient has  a good understanding of the overall plan. she agrees with it. she will call with any problems that may develop before the next visit here. Total time spent: 30 mins including face to face time and time spent for planning, charting and co-ordination of care   Tamsen Meek, MD 09/27/23

## 2023-09-27 NOTE — Assessment & Plan Note (Addendum)
 08/03/2023:Screening mammogram detected right breast mass with calcifications LIQ posterior at 5:00: 1.9 cm ultrasound biopsy: Grade 2 IDC with intermediate grade DCIS with necrosis axilla negative, ER 95%, PR 95%, Ki67 10%, HER2 negative by FISH   Recommendations: 1.  Right lumpectomy: 09/06/2023: Grade 2 IDC 1.7 cm with DCIS, margins negative, LVI not identified, ER 95%, PR 95%, HER2 negative, Ki-67 10% 2. Oncotype DX testing to determine if chemotherapy would be of any benefit followed by 3. Adjuvant radiation therapy followed by 4. Adjuvant antiestrogen therapy ------------------------------------------------------------------------------------------------------------------------------------------------- Pathology counseling: I discussed the final pathology report of the patient provided  a copy of this report. I discussed the margins as well as lymph node surgeries. We also discussed the final staging along with previously performed ER/PR and HER-2/neu testing.  Return to clinic based upon Oncotype DX test result

## 2023-09-28 DIAGNOSIS — N1831 Chronic kidney disease, stage 3a: Secondary | ICD-10-CM | POA: Diagnosis not present

## 2023-09-28 DIAGNOSIS — E1122 Type 2 diabetes mellitus with diabetic chronic kidney disease: Secondary | ICD-10-CM | POA: Diagnosis not present

## 2023-09-28 DIAGNOSIS — E785 Hyperlipidemia, unspecified: Secondary | ICD-10-CM | POA: Diagnosis not present

## 2023-09-29 ENCOUNTER — Encounter: Payer: Self-pay | Admitting: Physician Assistant

## 2023-09-29 LAB — CMP14+EGFR
ALT: 14 IU/L (ref 0–32)
AST: 18 IU/L (ref 0–40)
Albumin: 4.5 g/dL (ref 3.9–4.9)
Alkaline Phosphatase: 98 IU/L (ref 44–121)
BUN/Creatinine Ratio: 23 (ref 12–28)
BUN: 27 mg/dL (ref 8–27)
Bilirubin Total: 0.3 mg/dL (ref 0.0–1.2)
CO2: 19 mmol/L — ABNORMAL LOW (ref 20–29)
Calcium: 10.3 mg/dL (ref 8.7–10.3)
Chloride: 103 mmol/L (ref 96–106)
Creatinine, Ser: 1.16 mg/dL — ABNORMAL HIGH (ref 0.57–1.00)
Globulin, Total: 2.8 g/dL (ref 1.5–4.5)
Glucose: 88 mg/dL (ref 70–99)
Potassium: 5.4 mmol/L — ABNORMAL HIGH (ref 3.5–5.2)
Sodium: 138 mmol/L (ref 134–144)
Total Protein: 7.3 g/dL (ref 6.0–8.5)
eGFR: 52 mL/min/{1.73_m2} — ABNORMAL LOW (ref >59)

## 2023-09-29 LAB — LIPID PANEL
Chol/HDL Ratio: 3.3 ratio (ref 0.0–4.4)
Cholesterol, Total: 150 mg/dL (ref 100–199)
HDL: 45 mg/dL (ref >39)
LDL Chol Calc (NIH): 90 mg/dL (ref 0–99)
Triglycerides: 75 mg/dL (ref 0–149)
VLDL Cholesterol Cal: 15 mg/dL (ref 5–40)

## 2023-09-29 NOTE — Progress Notes (Signed)
 Bonnie Torres,   Cholesterol looks great.   Potassium a little too high. Can you cut your potassium in half daily?   Your kidneys look dry and kidney function has dropped some. Increase hydration and recheck bmp in 1 week.

## 2023-09-30 NOTE — Progress Notes (Signed)
 Location of Breast Cancer:right    Histology per Pathology Report:    Receptor Status: ER(positive), PR (positive), Her2-neu (negative), Ki-67(10%)  Did patient present with symptoms (if so, please note symptoms) or was this found on screening mammography?: Mammography  Past/Anticipated interventions by surgeon, if MV[facility]:QIONG    Past/Anticipated interventions by medical oncology, if any:Dr. Pamelia Hoit   Lymphedema issues, if any:  No  Skin:   Pain issues, if any:  No   SAFETY ISSUES: Prior radiation? No Pacemaker/ICD? No Possible current pregnancy?No Is the patient on methotrexate? No  BP 132/80 (BP Location: Right Arm, Patient Position: Sitting, Cuff Size: Normal)   Pulse 80   Temp (!) 97.4 F (36.3 C)   Resp 18   Ht 4\' 10"  (1.473 m)   Wt 157 lb 9.6 oz (71.5 kg)   SpO2 100%   BMI 32.94 kg/m

## 2023-10-05 NOTE — Progress Notes (Signed)
 Radiation Oncology         [phone] ________________________________  Name: Bonnie Torres MRN: [id number]  Date: 10/06/2023  DOB: 23-May-1956  Re-Evaluation Note  CC: Alda Ponder, MD  No diagnosis found.  Diagnosis:  The encounter diagnosis was Malignant neoplasm of lower-inner quadrant of right breast of female, estrogen receptor positive (HCC).   Stage IA (cT1c, cN0, cM0) Right Breast LOQ, Invasive and in situ ductal carcinoma, ER+ / PR+ / Her2-, Grade 2  Narrative:  The patient returns today to discuss radiation treatment options. She was seen in the multidisciplinary breast clinic on 08/11/23.   Since consultation, she underwent genetic testing on 08/23/23. Results showed no pathogenic mutations explaining her recent diagnosis. However, VUS in POLE at p.M1212V (c.3634A>G) was indicated with unknown significance.    She opted to proceed with right breast seed bracketed lumpectomy, w/o nodal biopsy on 09/06/23 under the care of Dr. Dwain Sarna. Pathology from the procedure revealed: grade II/III extensive, predominantly solid Ductal carcinoma in situ with comedonecrosis presence. Tumor size of 1.7 x 1.2 x 1.0 cm. All margins negative for invasive disease. Closest margin to disease is 4 mm from superior margin. Estrogen Receptor: Positive, 95% strong. Progesterone Receptor: Positive, 95% strong. HER2: Negative by FISH; Ki-67: 10%   Right breast lateral margin biopsy showed extremely small focus of duct with atypical cells and associated calcifications with thermal/mechanical artifact, unable to accurately  classify but suspicious for ADH/DCIS but is negative for invasive carcinoma.   Oncotype DX was obtained on the final surgical sample and the recurrence score of 11 predicts a risk of recurrence outside the breast over the next 9 years of 3%, if the patient's only systemic therapy is an antiestrogen for 5 years.  It also predicts no significant benefit from  chemotherapy.  During most recent follow up with Dr. Pamelia Hoit on 09/27/23, she reported feeling well overall. Based on Oncotype testing, chemotherapy will not be initiated. She decided to proceed with radiation and ani-estrogen therapy.     On review of systems, the patient reports ***. She denies *** and any other symptoms.    Allergies:  has no known allergies.  Meds: Current Outpatient Medications  Medication Sig Dispense Refill   atorvastatin (LIPITOR) 80 MG tablet TAKE 1 TABLET BY MOUTH DAILY 100 tablet 2   metFORMIN (GLUCOPHAGE-XR) 750 MG 24 hr tablet TAKE 1 TABLET BY MOUTH DAILY  WITH BREAKFAST 100 tablet 2   metoprolol succinate (TOPROL-XL) 100 MG 24 hr tablet TAKE 1 TABLET BY MOUTH DAILY 100 tablet 2   Olmesartan-amLODIPine-HCTZ 20-5-12.5 MG TABS TAKE 1 TABLET BY MOUTH DAILY 100 tablet 2   potassium chloride SA (KLOR-CON M) 20 MEQ tablet Take 1 tablet (20 mEq total) by mouth daily. 100 tablet 2   zolpidem (AMBIEN) 10 MG tablet TAKE 1/2 TO 1 TABLET(5 TO 10 MG) BY MOUTH AT BEDTIME 90 tablet 1   No current facility-administered medications for this encounter.    Physical Findings: The patient is in no acute distress. Patient is alert and oriented.  vitals were not taken for this visit.  No significant changes. Lungs are clear to auscultation bilaterally. Heart has regular rate and rhythm. No palpable cervical, supraclavicular, or axillary adenopathy. Abdomen soft, non-tender, normal bowel sounds. *** Breast: no palpable mass, nipple discharge or bleeding. *** Breast: ***  Lab Findings: Lab Results  Component Value Date   WBC 5.4 08/11/2023   HGB 13.2 08/11/2023   HCT 37.7 08/11/2023  MCV 74.5 (L) 08/11/2023   PLT 303 08/11/2023    Radiographic Findings: MM Breast Surgical Specimen Result Date: 09/06/2023 CLINICAL DATA:  Evaluate surgical specimen following lumpectomy for RIGHT breast cancer. EXAM: SPECIMEN RADIOGRAPH OF THE RIGHT BREAST COMPARISON:  Previous exam(s).  FINDINGS: Status post excision of the RIGHT breast. The 2 radioactive seeds and heart biopsy marker clip are present and intact. IMPRESSION: Specimen radiograph of the RIGHT breast. Electronically Signed   By: Harmon Pier M.D.   On: 09/06/2023 10:20    Impression: Stage IA (cT1c, cN0, cM0) Right Breast LOQ, Invasive and in situ ductal carcinoma, ER+ / PR+ / Her2-, Grade 2  ***  Plan:  Patient is scheduled for CT simulation {date/later today}. ***  -----------------------------------  Billie Lade, PhD, MD  This document serves as a record of services personally performed by Antony Blackbird, MD. It was created on his behalf by Herbie Saxon, a trained medical scribe. The creation of this record is based on the scribe's personal observations and the provider's statements to them. This document has been checked and approved by the attending provider.

## 2023-10-06 ENCOUNTER — Ambulatory Visit
Admission: RE | Admit: 2023-10-06 | Discharge: 2023-10-06 | Disposition: A | Discharge status: Home / Self Care | Source: Ambulatory Visit | Attending: Radiation Oncology | Admitting: Radiation Oncology

## 2023-10-06 ENCOUNTER — Encounter: Payer: Self-pay | Admitting: Radiation Oncology

## 2023-10-06 VITALS — BP 132/80 | HR 80 | Temp 97.4°F | Resp 18 | Ht <= 58 in | Wt 157.6 lb

## 2023-10-06 DIAGNOSIS — Z7984 Long term (current) use of oral hypoglycemic drugs: Secondary | ICD-10-CM | POA: Insufficient documentation

## 2023-10-06 DIAGNOSIS — Z79899 Other long term (current) drug therapy: Secondary | ICD-10-CM | POA: Insufficient documentation

## 2023-10-06 DIAGNOSIS — Z170 Estrogen receptor positive status [ER+]: Secondary | ICD-10-CM | POA: Insufficient documentation

## 2023-10-06 DIAGNOSIS — Z510 Encounter for antineoplastic radiation therapy: Secondary | ICD-10-CM | POA: Insufficient documentation

## 2023-10-06 DIAGNOSIS — C50311 Malignant neoplasm of lower-inner quadrant of right female breast: Secondary | ICD-10-CM

## 2023-10-06 DIAGNOSIS — Z923 Personal history of irradiation: Secondary | ICD-10-CM | POA: Diagnosis not present

## 2023-10-06 DIAGNOSIS — Z85528 Personal history of other malignant neoplasm of kidney: Secondary | ICD-10-CM | POA: Diagnosis not present

## 2023-10-06 DIAGNOSIS — N200 Calculus of kidney: Secondary | ICD-10-CM | POA: Diagnosis not present

## 2023-10-06 DIAGNOSIS — C642 Malignant neoplasm of left kidney, except renal pelvis: Secondary | ICD-10-CM | POA: Diagnosis not present

## 2023-10-06 DIAGNOSIS — Z905 Acquired absence of kidney: Secondary | ICD-10-CM | POA: Diagnosis not present

## 2023-10-06 DIAGNOSIS — Z9049 Acquired absence of other specified parts of digestive tract: Secondary | ICD-10-CM | POA: Diagnosis not present

## 2023-10-07 DIAGNOSIS — Z170 Estrogen receptor positive status [ER+]: Secondary | ICD-10-CM | POA: Diagnosis not present

## 2023-10-07 DIAGNOSIS — C50311 Malignant neoplasm of lower-inner quadrant of right female breast: Secondary | ICD-10-CM | POA: Diagnosis not present

## 2023-10-10 DIAGNOSIS — Z510 Encounter for antineoplastic radiation therapy: Secondary | ICD-10-CM | POA: Diagnosis not present

## 2023-10-10 DIAGNOSIS — C50311 Malignant neoplasm of lower-inner quadrant of right female breast: Secondary | ICD-10-CM | POA: Diagnosis not present

## 2023-10-10 DIAGNOSIS — Z170 Estrogen receptor positive status [ER+]: Secondary | ICD-10-CM | POA: Diagnosis not present

## 2023-10-13 ENCOUNTER — Other Ambulatory Visit: Payer: Self-pay

## 2023-10-13 ENCOUNTER — Ambulatory Visit
Admission: RE | Admit: 2023-10-13 | Discharge: 2023-10-13 | Disposition: A | Discharge status: Home / Self Care | Source: Ambulatory Visit | Attending: Radiation Oncology | Admitting: Radiation Oncology

## 2023-10-13 ENCOUNTER — Encounter: Payer: Self-pay | Admitting: *Deleted

## 2023-10-13 DIAGNOSIS — C50311 Malignant neoplasm of lower-inner quadrant of right female breast: Secondary | ICD-10-CM | POA: Diagnosis not present

## 2023-10-13 DIAGNOSIS — Z510 Encounter for antineoplastic radiation therapy: Secondary | ICD-10-CM | POA: Diagnosis not present

## 2023-10-13 DIAGNOSIS — N1831 Chronic kidney disease, stage 3a: Secondary | ICD-10-CM | POA: Diagnosis not present

## 2023-10-13 DIAGNOSIS — E1122 Type 2 diabetes mellitus with diabetic chronic kidney disease: Secondary | ICD-10-CM | POA: Diagnosis not present

## 2023-10-13 DIAGNOSIS — Z170 Estrogen receptor positive status [ER+]: Secondary | ICD-10-CM

## 2023-10-13 DIAGNOSIS — E785 Hyperlipidemia, unspecified: Secondary | ICD-10-CM | POA: Diagnosis not present

## 2023-10-13 LAB — RAD ONC ARIA SESSION SUMMARY
Course Elapsed Days: 0
Plan Fractions Treated to Date: 1
Plan Prescribed Dose Per Fraction: 2.67 Gy
Plan Total Fractions Prescribed: 15
Plan Total Prescribed Dose: 40.05 Gy
Reference Point Dosage Given to Date: 2.67 Gy
Reference Point Session Dosage Given: 2.67 Gy
Session Number: 1

## 2023-10-13 NOTE — Addendum Note (Signed)
 Addended by: Henessy Rohrer A on: 10/13/2023 09:30 AM   Modules accepted: Orders

## 2023-10-14 ENCOUNTER — Other Ambulatory Visit: Payer: Self-pay

## 2023-10-14 ENCOUNTER — Ambulatory Visit
Admission: RE | Admit: 2023-10-14 | Discharge: 2023-10-14 | Disposition: A | Discharge status: Home / Self Care | Source: Ambulatory Visit | Attending: Radiation Oncology | Admitting: Radiation Oncology

## 2023-10-14 DIAGNOSIS — Z170 Estrogen receptor positive status [ER+]: Secondary | ICD-10-CM | POA: Diagnosis not present

## 2023-10-14 DIAGNOSIS — C50311 Malignant neoplasm of lower-inner quadrant of right female breast: Secondary | ICD-10-CM | POA: Diagnosis not present

## 2023-10-14 DIAGNOSIS — Z510 Encounter for antineoplastic radiation therapy: Secondary | ICD-10-CM | POA: Diagnosis not present

## 2023-10-14 LAB — CMP14+EGFR
ALT: 13 IU/L (ref 0–32)
AST: 18 IU/L (ref 0–40)
Albumin: 4.5 g/dL (ref 3.9–4.9)
Alkaline Phosphatase: 92 IU/L (ref 44–121)
BUN/Creatinine Ratio: 18 (ref 12–28)
BUN: 22 mg/dL (ref 8–27)
Bilirubin Total: 0.3 mg/dL (ref 0.0–1.2)
CO2: 19 mmol/L — ABNORMAL LOW (ref 20–29)
Calcium: 10.1 mg/dL (ref 8.7–10.3)
Chloride: 102 mmol/L (ref 96–106)
Creatinine, Ser: 1.21 mg/dL — ABNORMAL HIGH (ref 0.57–1.00)
Globulin, Total: 2.7 g/dL (ref 1.5–4.5)
Glucose: 109 mg/dL — ABNORMAL HIGH (ref 70–99)
Potassium: 4.8 mmol/L (ref 3.5–5.2)
Sodium: 139 mmol/L (ref 134–144)
Total Protein: 7.2 g/dL (ref 6.0–8.5)
eGFR: 49 mL/min/{1.73_m2} — ABNORMAL LOW (ref >59)

## 2023-10-14 LAB — RAD ONC ARIA SESSION SUMMARY
Course Elapsed Days: 1
Plan Fractions Treated to Date: 2
Plan Prescribed Dose Per Fraction: 2.67 Gy
Plan Total Fractions Prescribed: 15
Plan Total Prescribed Dose: 40.05 Gy
Reference Point Dosage Given to Date: 5.34 Gy
Reference Point Session Dosage Given: 2.67 Gy
Session Number: 2

## 2023-10-14 NOTE — Progress Notes (Signed)
 Potassium normal. Kidney function dropped some. I think we should start farxiga for kidney protection in hopes of keeping GFR more stable. Are you ok with this?

## 2023-10-15 ENCOUNTER — Other Ambulatory Visit: Payer: Self-pay

## 2023-10-15 ENCOUNTER — Ambulatory Visit
Admission: RE | Admit: 2023-10-15 | Discharge: 2023-10-15 | Disposition: A | Discharge status: Home / Self Care | Source: Ambulatory Visit | Attending: Radiation Oncology | Admitting: Radiation Oncology

## 2023-10-15 DIAGNOSIS — C50311 Malignant neoplasm of lower-inner quadrant of right female breast: Secondary | ICD-10-CM | POA: Diagnosis not present

## 2023-10-15 DIAGNOSIS — Z510 Encounter for antineoplastic radiation therapy: Secondary | ICD-10-CM | POA: Diagnosis not present

## 2023-10-15 LAB — RAD ONC ARIA SESSION SUMMARY
Course Elapsed Days: 2
Plan Fractions Treated to Date: 3
Plan Prescribed Dose Per Fraction: 2.67 Gy
Plan Total Fractions Prescribed: 15
Plan Total Prescribed Dose: 40.05 Gy
Reference Point Dosage Given to Date: 8.01 Gy
Reference Point Session Dosage Given: 2.67 Gy
Session Number: 3

## 2023-10-18 ENCOUNTER — Telehealth: Payer: Self-pay | Admitting: Hematology and Oncology

## 2023-10-18 ENCOUNTER — Ambulatory Visit
Admission: RE | Admit: 2023-10-18 | Discharge: 2023-10-18 | Disposition: A | Discharge status: Home / Self Care | Source: Ambulatory Visit | Attending: Radiation Oncology | Admitting: Radiation Oncology

## 2023-10-18 ENCOUNTER — Other Ambulatory Visit: Payer: Self-pay

## 2023-10-18 DIAGNOSIS — Z170 Estrogen receptor positive status [ER+]: Secondary | ICD-10-CM | POA: Diagnosis not present

## 2023-10-18 DIAGNOSIS — C50311 Malignant neoplasm of lower-inner quadrant of right female breast: Secondary | ICD-10-CM | POA: Diagnosis not present

## 2023-10-18 DIAGNOSIS — Z510 Encounter for antineoplastic radiation therapy: Secondary | ICD-10-CM | POA: Diagnosis not present

## 2023-10-18 LAB — RAD ONC ARIA SESSION SUMMARY
Course Elapsed Days: 5
Plan Fractions Treated to Date: 4
Plan Prescribed Dose Per Fraction: 2.67 Gy
Plan Total Fractions Prescribed: 15
Plan Total Prescribed Dose: 40.05 Gy
Reference Point Dosage Given to Date: 10.68 Gy
Reference Point Session Dosage Given: 2.67 Gy
Session Number: 4

## 2023-10-18 NOTE — Telephone Encounter (Signed)
 Bonnie Torres scheduled her appointment and is aware of all appointment details.

## 2023-10-18 NOTE — Telephone Encounter (Signed)
 I left a detailed message of the patient's appointment details. I informed the patient to call back if appointments do not work.

## 2023-10-19 ENCOUNTER — Other Ambulatory Visit: Payer: Self-pay

## 2023-10-19 ENCOUNTER — Ambulatory Visit
Admission: RE | Admit: 2023-10-19 | Discharge: 2023-10-19 | Disposition: A | Discharge status: Home / Self Care | Source: Ambulatory Visit | Attending: Radiation Oncology | Admitting: Radiation Oncology

## 2023-10-19 DIAGNOSIS — Z510 Encounter for antineoplastic radiation therapy: Secondary | ICD-10-CM | POA: Diagnosis not present

## 2023-10-19 DIAGNOSIS — Z170 Estrogen receptor positive status [ER+]: Secondary | ICD-10-CM | POA: Diagnosis not present

## 2023-10-19 DIAGNOSIS — C50311 Malignant neoplasm of lower-inner quadrant of right female breast: Secondary | ICD-10-CM | POA: Diagnosis not present

## 2023-10-19 LAB — RAD ONC ARIA SESSION SUMMARY
Course Elapsed Days: 6
Plan Fractions Treated to Date: 5
Plan Prescribed Dose Per Fraction: 2.67 Gy
Plan Total Fractions Prescribed: 15
Plan Total Prescribed Dose: 40.05 Gy
Reference Point Dosage Given to Date: 13.35 Gy
Reference Point Session Dosage Given: 2.67 Gy
Session Number: 5

## 2023-10-19 MED ORDER — RADIAPLEXRX EX GEL: Freq: Once | CUTANEOUS | Status: AC

## 2023-10-19 MED ORDER — ALRA NON-METALLIC DEODORANT (RAD-ONC)
1.0000 | Freq: Once | TOPICAL | Status: AC
  Administered 2023-10-19: 1 via TOPICAL

## 2023-10-20 ENCOUNTER — Other Ambulatory Visit: Payer: Self-pay

## 2023-10-20 ENCOUNTER — Ambulatory Visit
Admission: RE | Admit: 2023-10-20 | Discharge: 2023-10-20 | Disposition: A | Discharge status: Home / Self Care | Source: Ambulatory Visit | Attending: Radiation Oncology | Admitting: Radiation Oncology

## 2023-10-20 DIAGNOSIS — Z170 Estrogen receptor positive status [ER+]: Secondary | ICD-10-CM | POA: Diagnosis not present

## 2023-10-20 DIAGNOSIS — C50311 Malignant neoplasm of lower-inner quadrant of right female breast: Secondary | ICD-10-CM | POA: Diagnosis not present

## 2023-10-20 DIAGNOSIS — Z510 Encounter for antineoplastic radiation therapy: Secondary | ICD-10-CM | POA: Diagnosis not present

## 2023-10-20 LAB — RAD ONC ARIA SESSION SUMMARY
Course Elapsed Days: 7
Plan Fractions Treated to Date: 6
Plan Prescribed Dose Per Fraction: 2.67 Gy
Plan Total Fractions Prescribed: 15
Plan Total Prescribed Dose: 40.05 Gy
Reference Point Dosage Given to Date: 16.02 Gy
Reference Point Session Dosage Given: 2.67 Gy
Session Number: 6

## 2023-10-21 ENCOUNTER — Ambulatory Visit
Admission: RE | Admit: 2023-10-21 | Discharge: 2023-10-21 | Disposition: A | Discharge status: Home / Self Care | Source: Ambulatory Visit | Attending: Radiation Oncology | Admitting: Radiation Oncology

## 2023-10-21 ENCOUNTER — Other Ambulatory Visit: Payer: Self-pay

## 2023-10-21 DIAGNOSIS — C50311 Malignant neoplasm of lower-inner quadrant of right female breast: Secondary | ICD-10-CM | POA: Diagnosis not present

## 2023-10-21 DIAGNOSIS — Z170 Estrogen receptor positive status [ER+]: Secondary | ICD-10-CM | POA: Diagnosis not present

## 2023-10-21 DIAGNOSIS — Z510 Encounter for antineoplastic radiation therapy: Secondary | ICD-10-CM | POA: Diagnosis not present

## 2023-10-21 LAB — RAD ONC ARIA SESSION SUMMARY
Course Elapsed Days: 8
Plan Fractions Treated to Date: 7
Plan Prescribed Dose Per Fraction: 2.67 Gy
Plan Total Fractions Prescribed: 15
Plan Total Prescribed Dose: 40.05 Gy
Reference Point Dosage Given to Date: 18.69 Gy
Reference Point Session Dosage Given: 2.67 Gy
Session Number: 7

## 2023-10-22 ENCOUNTER — Ambulatory Visit
Admission: RE | Admit: 2023-10-22 | Discharge: 2023-10-22 | Disposition: A | Discharge status: Home / Self Care | Source: Ambulatory Visit | Attending: Radiation Oncology | Admitting: Radiation Oncology

## 2023-10-22 ENCOUNTER — Telehealth: Payer: Self-pay

## 2023-10-22 ENCOUNTER — Other Ambulatory Visit: Payer: Self-pay

## 2023-10-22 DIAGNOSIS — Z510 Encounter for antineoplastic radiation therapy: Secondary | ICD-10-CM | POA: Diagnosis not present

## 2023-10-22 DIAGNOSIS — Z170 Estrogen receptor positive status [ER+]: Secondary | ICD-10-CM | POA: Diagnosis not present

## 2023-10-22 DIAGNOSIS — C50311 Malignant neoplasm of lower-inner quadrant of right female breast: Secondary | ICD-10-CM | POA: Diagnosis not present

## 2023-10-22 LAB — RAD ONC ARIA SESSION SUMMARY
Course Elapsed Days: 9
Plan Fractions Treated to Date: 8
Plan Prescribed Dose Per Fraction: 2.67 Gy
Plan Total Fractions Prescribed: 15
Plan Total Prescribed Dose: 40.05 Gy
Reference Point Dosage Given to Date: 21.36 Gy
Reference Point Session Dosage Given: 2.67 Gy
Session Number: 8

## 2023-10-22 NOTE — Telephone Encounter (Signed)
 Notified Patient of completion of FMLA forms. Fax transmission confirmation received. Copy of forms placed for pickup as requested by Patient. No other needs or concerns noted at this time.

## 2023-10-25 ENCOUNTER — Ambulatory Visit
Admission: RE | Admit: 2023-10-25 | Discharge: 2023-10-25 | Disposition: A | Discharge status: Home / Self Care | Source: Ambulatory Visit | Attending: Radiation Oncology | Admitting: Radiation Oncology

## 2023-10-25 ENCOUNTER — Other Ambulatory Visit: Payer: Self-pay

## 2023-10-25 DIAGNOSIS — C50311 Malignant neoplasm of lower-inner quadrant of right female breast: Secondary | ICD-10-CM | POA: Diagnosis not present

## 2023-10-25 DIAGNOSIS — Z170 Estrogen receptor positive status [ER+]: Secondary | ICD-10-CM | POA: Diagnosis not present

## 2023-10-25 DIAGNOSIS — Z510 Encounter for antineoplastic radiation therapy: Secondary | ICD-10-CM | POA: Diagnosis not present

## 2023-10-25 LAB — RAD ONC ARIA SESSION SUMMARY
Course Elapsed Days: 12
Plan Fractions Treated to Date: 9
Plan Prescribed Dose Per Fraction: 2.67 Gy
Plan Total Fractions Prescribed: 15
Plan Total Prescribed Dose: 40.05 Gy
Reference Point Dosage Given to Date: 24.03 Gy
Reference Point Session Dosage Given: 2.67 Gy
Session Number: 9

## 2023-10-26 ENCOUNTER — Ambulatory Visit
Admission: RE | Admit: 2023-10-26 | Discharge: 2023-10-26 | Disposition: A | Discharge status: Home / Self Care | Source: Ambulatory Visit | Attending: Radiation Oncology | Admitting: Radiation Oncology

## 2023-10-26 ENCOUNTER — Other Ambulatory Visit: Payer: Self-pay

## 2023-10-26 DIAGNOSIS — Z510 Encounter for antineoplastic radiation therapy: Secondary | ICD-10-CM | POA: Diagnosis not present

## 2023-10-26 DIAGNOSIS — Z170 Estrogen receptor positive status [ER+]: Secondary | ICD-10-CM | POA: Diagnosis not present

## 2023-10-26 DIAGNOSIS — C50311 Malignant neoplasm of lower-inner quadrant of right female breast: Secondary | ICD-10-CM | POA: Diagnosis not present

## 2023-10-26 LAB — RAD ONC ARIA SESSION SUMMARY
Course Elapsed Days: 13
Plan Fractions Treated to Date: 10
Plan Prescribed Dose Per Fraction: 2.67 Gy
Plan Total Fractions Prescribed: 15
Plan Total Prescribed Dose: 40.05 Gy
Reference Point Dosage Given to Date: 26.7 Gy
Reference Point Session Dosage Given: 2.67 Gy
Session Number: 10

## 2023-10-27 ENCOUNTER — Other Ambulatory Visit: Payer: Self-pay

## 2023-10-27 ENCOUNTER — Ambulatory Visit
Admission: RE | Admit: 2023-10-27 | Discharge: 2023-10-27 | Disposition: A | Discharge status: Home / Self Care | Source: Ambulatory Visit | Attending: Radiation Oncology | Admitting: Radiation Oncology

## 2023-10-27 DIAGNOSIS — Z170 Estrogen receptor positive status [ER+]: Secondary | ICD-10-CM | POA: Diagnosis not present

## 2023-10-27 DIAGNOSIS — C50311 Malignant neoplasm of lower-inner quadrant of right female breast: Secondary | ICD-10-CM | POA: Diagnosis not present

## 2023-10-27 DIAGNOSIS — Z510 Encounter for antineoplastic radiation therapy: Secondary | ICD-10-CM | POA: Diagnosis not present

## 2023-10-27 LAB — RAD ONC ARIA SESSION SUMMARY
Course Elapsed Days: 14
Plan Fractions Treated to Date: 11
Plan Prescribed Dose Per Fraction: 2.67 Gy
Plan Total Fractions Prescribed: 15
Plan Total Prescribed Dose: 40.05 Gy
Reference Point Dosage Given to Date: 29.37 Gy
Reference Point Session Dosage Given: 2.67 Gy
Session Number: 11

## 2023-10-28 ENCOUNTER — Other Ambulatory Visit: Payer: Self-pay

## 2023-10-28 ENCOUNTER — Ambulatory Visit
Admission: RE | Admit: 2023-10-28 | Discharge: 2023-10-28 | Disposition: A | Discharge status: Home / Self Care | Source: Ambulatory Visit | Attending: Radiation Oncology | Admitting: Radiation Oncology

## 2023-10-28 DIAGNOSIS — Z510 Encounter for antineoplastic radiation therapy: Secondary | ICD-10-CM | POA: Diagnosis not present

## 2023-10-28 DIAGNOSIS — C50311 Malignant neoplasm of lower-inner quadrant of right female breast: Secondary | ICD-10-CM | POA: Insufficient documentation

## 2023-10-28 DIAGNOSIS — Z170 Estrogen receptor positive status [ER+]: Secondary | ICD-10-CM | POA: Insufficient documentation

## 2023-10-28 LAB — RAD ONC ARIA SESSION SUMMARY
Course Elapsed Days: 15
Plan Fractions Treated to Date: 12
Plan Prescribed Dose Per Fraction: 2.67 Gy
Plan Total Fractions Prescribed: 15
Plan Total Prescribed Dose: 40.05 Gy
Reference Point Dosage Given to Date: 32.04 Gy
Reference Point Session Dosage Given: 2.67 Gy
Session Number: 12

## 2023-10-29 ENCOUNTER — Other Ambulatory Visit: Payer: Self-pay

## 2023-10-29 ENCOUNTER — Ambulatory Visit
Admission: RE | Admit: 2023-10-29 | Discharge: 2023-10-29 | Disposition: A | Discharge status: Home / Self Care | Source: Ambulatory Visit | Attending: Radiation Oncology | Admitting: Radiation Oncology

## 2023-10-29 DIAGNOSIS — Z170 Estrogen receptor positive status [ER+]: Secondary | ICD-10-CM | POA: Diagnosis not present

## 2023-10-29 DIAGNOSIS — Z510 Encounter for antineoplastic radiation therapy: Secondary | ICD-10-CM | POA: Diagnosis not present

## 2023-10-29 DIAGNOSIS — C50311 Malignant neoplasm of lower-inner quadrant of right female breast: Secondary | ICD-10-CM | POA: Diagnosis not present

## 2023-10-29 LAB — RAD ONC ARIA SESSION SUMMARY
Course Elapsed Days: 16
Plan Fractions Treated to Date: 13
Plan Prescribed Dose Per Fraction: 2.67 Gy
Plan Total Fractions Prescribed: 15
Plan Total Prescribed Dose: 40.05 Gy
Reference Point Dosage Given to Date: 34.71 Gy
Reference Point Session Dosage Given: 2.67 Gy
Session Number: 13

## 2023-11-01 ENCOUNTER — Ambulatory Visit
Admission: RE | Admit: 2023-11-01 | Discharge: 2023-11-01 | Disposition: A | Discharge status: Home / Self Care | Source: Ambulatory Visit | Attending: Radiation Oncology | Admitting: Radiation Oncology

## 2023-11-01 ENCOUNTER — Other Ambulatory Visit: Payer: Self-pay

## 2023-11-01 DIAGNOSIS — Z170 Estrogen receptor positive status [ER+]: Secondary | ICD-10-CM | POA: Diagnosis not present

## 2023-11-01 DIAGNOSIS — Z510 Encounter for antineoplastic radiation therapy: Secondary | ICD-10-CM | POA: Diagnosis not present

## 2023-11-01 DIAGNOSIS — C50311 Malignant neoplasm of lower-inner quadrant of right female breast: Secondary | ICD-10-CM | POA: Diagnosis not present

## 2023-11-01 LAB — RAD ONC ARIA SESSION SUMMARY
Course Elapsed Days: 19
Plan Fractions Treated to Date: 14
Plan Prescribed Dose Per Fraction: 2.67 Gy
Plan Total Fractions Prescribed: 15
Plan Total Prescribed Dose: 40.05 Gy
Reference Point Dosage Given to Date: 37.38 Gy
Reference Point Session Dosage Given: 2.67 Gy
Session Number: 14

## 2023-11-02 ENCOUNTER — Ambulatory Visit
Admission: RE | Admit: 2023-11-02 | Discharge: 2023-11-02 | Disposition: A | Discharge status: Home / Self Care | Source: Ambulatory Visit | Attending: Radiation Oncology | Admitting: Radiation Oncology

## 2023-11-02 ENCOUNTER — Other Ambulatory Visit: Payer: Self-pay

## 2023-11-02 ENCOUNTER — Ambulatory Visit: Admitting: Radiation Oncology

## 2023-11-02 DIAGNOSIS — Z510 Encounter for antineoplastic radiation therapy: Secondary | ICD-10-CM | POA: Diagnosis not present

## 2023-11-02 DIAGNOSIS — C50311 Malignant neoplasm of lower-inner quadrant of right female breast: Secondary | ICD-10-CM | POA: Diagnosis not present

## 2023-11-02 DIAGNOSIS — Z170 Estrogen receptor positive status [ER+]: Secondary | ICD-10-CM | POA: Diagnosis not present

## 2023-11-02 LAB — RAD ONC ARIA SESSION SUMMARY
Course Elapsed Days: 20
Plan Fractions Treated to Date: 15
Plan Prescribed Dose Per Fraction: 2.67 Gy
Plan Total Fractions Prescribed: 15
Plan Total Prescribed Dose: 40.05 Gy
Reference Point Dosage Given to Date: 40.05 Gy
Reference Point Session Dosage Given: 2.67 Gy
Session Number: 15

## 2023-11-03 ENCOUNTER — Ambulatory Visit
Admission: RE | Admit: 2023-11-03 | Discharge: 2023-11-03 | Disposition: A | Discharge status: Home / Self Care | Source: Ambulatory Visit | Attending: Radiation Oncology | Admitting: Radiation Oncology

## 2023-11-03 ENCOUNTER — Other Ambulatory Visit: Payer: Self-pay

## 2023-11-03 DIAGNOSIS — Z85528 Personal history of other malignant neoplasm of kidney: Secondary | ICD-10-CM | POA: Diagnosis not present

## 2023-11-03 DIAGNOSIS — Z170 Estrogen receptor positive status [ER+]: Secondary | ICD-10-CM | POA: Diagnosis not present

## 2023-11-03 DIAGNOSIS — C50311 Malignant neoplasm of lower-inner quadrant of right female breast: Secondary | ICD-10-CM

## 2023-11-03 DIAGNOSIS — Z510 Encounter for antineoplastic radiation therapy: Secondary | ICD-10-CM | POA: Diagnosis not present

## 2023-11-03 LAB — RAD ONC ARIA SESSION SUMMARY
Course Elapsed Days: 21
Plan Fractions Treated to Date: 1
Plan Prescribed Dose Per Fraction: 2 Gy
Plan Total Fractions Prescribed: 6
Plan Total Prescribed Dose: 12 Gy
Reference Point Dosage Given to Date: 2 Gy
Reference Point Session Dosage Given: 2 Gy
Session Number: 16

## 2023-11-04 ENCOUNTER — Other Ambulatory Visit: Payer: Self-pay

## 2023-11-04 ENCOUNTER — Ambulatory Visit
Admission: RE | Admit: 2023-11-04 | Discharge: 2023-11-04 | Disposition: A | Discharge status: Home / Self Care | Source: Ambulatory Visit | Attending: Radiation Oncology | Admitting: Radiation Oncology

## 2023-11-04 DIAGNOSIS — Z170 Estrogen receptor positive status [ER+]: Secondary | ICD-10-CM | POA: Diagnosis not present

## 2023-11-04 DIAGNOSIS — C50311 Malignant neoplasm of lower-inner quadrant of right female breast: Secondary | ICD-10-CM | POA: Diagnosis not present

## 2023-11-04 LAB — RAD ONC ARIA SESSION SUMMARY
Course Elapsed Days: 22
Plan Fractions Treated to Date: 2
Plan Prescribed Dose Per Fraction: 2 Gy
Plan Total Fractions Prescribed: 6
Plan Total Prescribed Dose: 12 Gy
Reference Point Dosage Given to Date: 4 Gy
Reference Point Session Dosage Given: 2 Gy
Session Number: 17

## 2023-11-05 ENCOUNTER — Other Ambulatory Visit: Payer: Self-pay

## 2023-11-05 ENCOUNTER — Ambulatory Visit
Admission: RE | Admit: 2023-11-05 | Discharge: 2023-11-05 | Disposition: A | Discharge status: Home / Self Care | Source: Ambulatory Visit | Attending: Radiation Oncology | Admitting: Radiation Oncology

## 2023-11-05 DIAGNOSIS — Z170 Estrogen receptor positive status [ER+]: Secondary | ICD-10-CM | POA: Diagnosis not present

## 2023-11-05 DIAGNOSIS — C50311 Malignant neoplasm of lower-inner quadrant of right female breast: Secondary | ICD-10-CM | POA: Diagnosis not present

## 2023-11-05 LAB — RAD ONC ARIA SESSION SUMMARY
Course Elapsed Days: 23
Plan Fractions Treated to Date: 3
Plan Prescribed Dose Per Fraction: 2 Gy
Plan Total Fractions Prescribed: 6
Plan Total Prescribed Dose: 12 Gy
Reference Point Dosage Given to Date: 6 Gy
Reference Point Session Dosage Given: 2 Gy
Session Number: 18

## 2023-11-08 ENCOUNTER — Ambulatory Visit

## 2023-11-09 ENCOUNTER — Other Ambulatory Visit: Payer: Self-pay

## 2023-11-09 ENCOUNTER — Ambulatory Visit
Admission: RE | Admit: 2023-11-09 | Discharge: 2023-11-09 | Disposition: A | Discharge status: Home / Self Care | Source: Ambulatory Visit | Attending: Radiation Oncology | Admitting: Radiation Oncology

## 2023-11-09 ENCOUNTER — Inpatient Hospital Stay (HOSPITAL_BASED_OUTPATIENT_CLINIC_OR_DEPARTMENT_OTHER): Admitting: Hematology and Oncology

## 2023-11-09 ENCOUNTER — Ambulatory Visit

## 2023-11-09 VITALS — BP 118/79 | HR 67 | Temp 97.8°F | Resp 18 | Ht <= 58 in | Wt 159.5 lb

## 2023-11-09 DIAGNOSIS — Z170 Estrogen receptor positive status [ER+]: Secondary | ICD-10-CM | POA: Diagnosis not present

## 2023-11-09 DIAGNOSIS — C50311 Malignant neoplasm of lower-inner quadrant of right female breast: Secondary | ICD-10-CM

## 2023-11-09 DIAGNOSIS — Z79811 Long term (current) use of aromatase inhibitors: Secondary | ICD-10-CM | POA: Insufficient documentation

## 2023-11-09 DIAGNOSIS — Z1721 Progesterone receptor positive status: Secondary | ICD-10-CM | POA: Insufficient documentation

## 2023-11-09 DIAGNOSIS — Z1732 Human epidermal growth factor receptor 2 negative status: Secondary | ICD-10-CM | POA: Insufficient documentation

## 2023-11-09 DIAGNOSIS — Z923 Personal history of irradiation: Secondary | ICD-10-CM | POA: Insufficient documentation

## 2023-11-09 LAB — RAD ONC ARIA SESSION SUMMARY
Course Elapsed Days: 27
Plan Fractions Treated to Date: 4
Plan Prescribed Dose Per Fraction: 2 Gy
Plan Total Fractions Prescribed: 6
Plan Total Prescribed Dose: 12 Gy
Reference Point Dosage Given to Date: 8 Gy
Reference Point Session Dosage Given: 2 Gy
Session Number: 19

## 2023-11-09 MED ORDER — ANASTROZOLE 1 MG PO TABS: 1.0000 mg | ORAL_TABLET | Freq: Every day | ORAL | 3 refills | Status: DC

## 2023-11-09 MED ORDER — RADIAPLEXRX EX GEL: Freq: Once | CUTANEOUS | Status: AC

## 2023-11-09 NOTE — Assessment & Plan Note (Signed)
 08/03/2023:Screening mammogram detected right breast mass with calcifications LIQ posterior at 5:00: 1.9 cm ultrasound biopsy: Grade 2 IDC with intermediate grade DCIS with necrosis axilla negative, ER 95%, PR 95%, Ki67 10%, HER2 negative by FISH   Recommendations: 1.  Right lumpectomy: 09/06/2023: Grade 2 IDC 1.7 cm with DCIS, margins negative, LVI not identified, ER 95%, PR 95%, HER2 negative, Ki-67 10% 2. Oncotype DX score 11 (3% risk of distant recurrence in 9 years) 3. Adjuvant radiation therapy 10/14/2023-11/10/2023 4. Adjuvant antiestrogen therapy start June 2025 -------------------------------------------------------------------------------------------------------------------- Anastrozole counseling: We discussed the risks and benefits of anti-estrogen therapy with aromatase inhibitors. These include but not limited to insomnia, hot flashes, mood changes, vaginal dryness, bone density loss, and weight gain. We strongly believe that the benefits far outweigh the risks. Patient understands these risks and consented to starting treatment. Planned treatment duration is 5-7 years.  Return to clinic in 3 months for survivorship care plan visit

## 2023-11-09 NOTE — Progress Notes (Signed)
 Patient Care Team: Breeback, Jade L, PA-C as PCP - General (Family Medicine) Otila Blizzard, OD (Optometry) Alane Hsu, RN as Oncology Nurse Navigator Auther Bo, RN as Oncology Nurse Navigator Enid Harry, MD as Consulting Physician (General Surgery) Cameron Cea, MD as Consulting Physician (Hematology and Oncology) Retta Caster, MD as Consulting Physician (Radiation Oncology)  DIAGNOSIS:  Encounter Diagnosis  Name Primary?   Malignant neoplasm of lower-inner quadrant of right breast of female, estrogen receptor positive (HCC) Yes    SUMMARY OF ONCOLOGIC HISTORY: Oncology History  Malignant neoplasm of lower-inner quadrant of right breast of female, estrogen receptor positive (HCC)  08/03/2023 Initial Diagnosis   Screening mammogram detected right breast mass with calcifications LIQ posterior at 5:00: 1.9 cm ultrasound biopsy: Grade 2 IDC with intermediate grade DCIS with necrosis axilla negative, ER 95%, PR 95%, Ki67 10%, HER2 negative by [place]   08/11/2023 Cancer Staging   Staging form: Breast, AJCC 8th Edition - Clinical stage from 08/11/2023: Stage IA (cT1c, cN0, cM0, G2, ER+, PR+, HER2-) - Signed by Cameron Cea, MD on 08/11/2023 Stage prefix: Initial diagnosis Histologic grading system: 3 grade system   08/20/2023 Genetic Testing   Negative [facility] CancerNext-Expanded +RNAinsight Panel.  VUS in POLE at p.M1212V (c.3634A>G).  Report date is 08/20/2023.   The CancerNext-Expanded gene panel offered by [facility] and includes sequencing, rearrangement, and RNA analysis for the following 76 genes: AIP, ALK, APC, ATM, AXIN2, BAP1, BARD1, BMPR1A, BRCA1, BRCA2, BRIP1, CDC73, CDH1, CDK4, CDKN1B, CDKN2A, CEBPA, CHEK2, CTNNA1, DDX41, DICER1, ETV6, FH, FLCN, GATA2, LZTR1, MAX, MBD4, MEN1, MET, MLH1, MSH2, MSH3, MSH6, MUTYH, NF1, NF2, NTHL1, PALB2, PHOX2B, PMS2, POT1, PRKAR1A, PTCH1, PTEN, RAD51C, RAD51D, RB1, RET, RUNX1, SDHA, SDHAF2, SDHB, SDHC, SDHD, SMAD4, SMARCA4,  SMARCB1, SMARCE1, STK11, SUFU, TMEM127, TP53, TSC1, TSC2, VHL, and WT1 (sequencing and deletion/duplication); EGFR, HOXB13, KIT, MITF, PDGFRA, POLD1, and POLE (sequencing only); EPCAM and GREM1 (deletion/duplication only).      CHIEF COMPLIANT: Follow-up after radiation is completed  HISTORY OF PRESENT ILLNESS: History of Present Illness Bonnie Torres is a 68 year old female with breast cancer who presents for follow-up after radiation therapy.  She experiences fatigue and breast tenderness as side effects of the radiation therapy. Two more days of radiation treatment remain due to a machine malfunction that caused a delay.     ALLERGIES:  has no known allergies.  MEDICATIONS:  Current Outpatient Medications  Medication Sig Dispense Refill   atorvastatin  (LIPITOR) 80 MG tablet TAKE 1 TABLET BY MOUTH DAILY 100 tablet 2   metFORMIN  (GLUCOPHAGE -XR) 750 MG 24 hr tablet TAKE 1 TABLET BY MOUTH DAILY  WITH BREAKFAST 100 tablet 2   metoprolol  succinate (TOPROL -XL) 100 MG 24 hr tablet TAKE 1 TABLET BY MOUTH DAILY 100 tablet 2   Olmesartan -amLODIPine -HCTZ 20-5-12.5 MG TABS TAKE 1 TABLET BY MOUTH DAILY 100 tablet 2   potassium chloride  SA (KLOR-CON  M) 20 MEQ tablet Take 1 tablet (20 mEq total) by mouth daily. 100 tablet 2   spironolactone  (ALDACTONE ) 100 MG tablet Take 100 mg by mouth daily.     tretinoin (RETIN-A) 0.025 % cream Apply topically.     zolpidem  (AMBIEN ) 10 MG tablet TAKE 1/2 TO 1 TABLET(5 TO 10 MG) BY MOUTH AT BEDTIME 90 tablet 1   No current facility-administered medications for this visit.    PHYSICAL EXAMINATION: ECOG PERFORMANCE STATUS: 1 - Symptomatic but completely ambulatory  Vitals:   11/09/23 1129  BP: 118/79  Pulse: 67  Resp: 18  Temp: 97.8 F (36.6 C)  SpO2: 100%   Filed Weights   11/09/23 1129  Weight: 159 lb 8 oz (72.3 kg)     LABORATORY DATA:  I have reviewed the data as listed    Latest Ref Rng & Units 10/13/2023    9:34 AM 09/28/2023    2:47 PM  08/11/2023   12:21 PM  CMP  Glucose 70 - 99 mg/dL 010  88  272   BUN 8 - 27 mg/dL 22  27  14    Creatinine 0.57 - 1.00 mg/dL 5.36  6.44  0.34   Sodium 134 - 144 mmol/L 139  138  139   Potassium 3.5 - 5.2 mmol/L 4.8  5.4  4.0   Chloride 96 - 106 mmol/L 102  103  104   CO2 20 - 29 mmol/L 19  19  29    Calcium  8.7 - 10.3 mg/dL 74.2  59.5  63.8   Total Protein 6.0 - 8.5 g/dL 7.2  7.3  8.1   Total Bilirubin 0.0 - 1.2 mg/dL 0.3  0.3  0.8   Alkaline Phos 44 - 121 IU/L 92  98  85   AST 0 - 40 IU/L 18  18  21    ALT 0 - 32 IU/L 13  14  17      Lab Results  Component Value Date   WBC 5.4 08/11/2023   HGB 13.2 08/11/2023   HCT 37.7 08/11/2023   MCV 74.5 (L) 08/11/2023   PLT 303 08/11/2023   NEUTROABS 2.1 08/11/2023    ASSESSMENT & PLAN:  Malignant neoplasm of lower-inner quadrant of right breast of female, estrogen receptor positive (HCC) 08/03/2023:Screening mammogram detected right breast mass with calcifications LIQ posterior at 5:00: 1.9 cm ultrasound biopsy: Grade 2 IDC with intermediate grade DCIS with necrosis axilla negative, ER 95%, PR 95%, Ki67 10%, HER2 negative by FISH   Recommendations: 1.  Right lumpectomy: 09/06/2023: Grade 2 IDC 1.7 cm with DCIS, margins negative, LVI not identified, ER 95%, PR 95%, HER2 negative, Ki-67 10% 2. Oncotype DX score 11 (3% risk of distant recurrence in 9 years) 3. Adjuvant radiation therapy 10/14/2023-11/10/2023 4. Adjuvant antiestrogen therapy start June 2025 -------------------------------------------------------------------------------------------------------------------- Anastrozole counseling: We discussed the risks and benefits of anti-estrogen therapy with aromatase inhibitors. These include but not limited to insomnia, hot flashes, mood changes, vaginal dryness, bone density loss, and weight gain. We strongly believe that the benefits far outweigh the risks. Patient understands these risks and consented to starting treatment. Planned treatment  duration is 5-7 years.  Return to clinic in 3 months for survivorship care plan visit  ------------------------------------- Assessment and Plan Assessment & Plan Breast cancer treatment follow-up Completing radiation therapy. Anastrozole to be initiated post-radiation to reduce estrogen production. Long-term monitoring for osteoporosis required. - Complete remaining two sessions of radiation therapy. - Begin anastrozole 1 mg daily starting June 1st. - Monitor for side effects such as hot flashes and joint stiffness. - Schedule next mammogram and discuss exercise and supplements to prevent recurrence. - Follow up in three months for survivorship care with nurse practitioner.  Osteoporosis risk due to anastrozole Anastrozole increases osteoporosis risk by reducing estrogen production, compounded by aging. - Monitor bone density with a test next year. - Recommend calcium  and vitamin D supplementation. - Encourage weight-bearing exercises such as walking.      No orders of the defined types were placed in this encounter.  The patient has a good understanding of the overall plan. she agrees with  it. she will call with any problems that may develop before the next visit here. Total time spent: 30 mins including face to face time and time spent for planning, charting and co-ordination of care   Viinay K Lorel Lembo, MD 11/09/23

## 2023-11-10 ENCOUNTER — Ambulatory Visit

## 2023-11-10 ENCOUNTER — Ambulatory Visit
Admission: RE | Admit: 2023-11-10 | Discharge: 2023-11-10 | Disposition: A | Discharge status: Home / Self Care | Source: Ambulatory Visit | Attending: Radiation Oncology | Admitting: Radiation Oncology

## 2023-11-10 ENCOUNTER — Other Ambulatory Visit: Payer: Self-pay

## 2023-11-10 DIAGNOSIS — Z170 Estrogen receptor positive status [ER+]: Secondary | ICD-10-CM | POA: Diagnosis not present

## 2023-11-10 DIAGNOSIS — Z510 Encounter for antineoplastic radiation therapy: Secondary | ICD-10-CM | POA: Diagnosis not present

## 2023-11-10 DIAGNOSIS — C50311 Malignant neoplasm of lower-inner quadrant of right female breast: Secondary | ICD-10-CM | POA: Diagnosis not present

## 2023-11-10 LAB — RAD ONC ARIA SESSION SUMMARY
Course Elapsed Days: 28
Plan Fractions Treated to Date: 5
Plan Prescribed Dose Per Fraction: 2 Gy
Plan Total Fractions Prescribed: 6
Plan Total Prescribed Dose: 12 Gy
Reference Point Dosage Given to Date: 10 Gy
Reference Point Session Dosage Given: 2 Gy
Session Number: 20

## 2023-11-11 ENCOUNTER — Other Ambulatory Visit: Payer: Self-pay

## 2023-11-11 ENCOUNTER — Ambulatory Visit
Admission: RE | Admit: 2023-11-11 | Discharge: 2023-11-11 | Discharge status: Home / Self Care | Source: Ambulatory Visit | Attending: Radiation Oncology | Admitting: Radiation Oncology

## 2023-11-11 DIAGNOSIS — C50311 Malignant neoplasm of lower-inner quadrant of right female breast: Secondary | ICD-10-CM | POA: Diagnosis not present

## 2023-11-11 DIAGNOSIS — Z170 Estrogen receptor positive status [ER+]: Secondary | ICD-10-CM | POA: Diagnosis not present

## 2023-11-11 LAB — RAD ONC ARIA SESSION SUMMARY
Course Elapsed Days: 29
Plan Fractions Treated to Date: 6
Plan Prescribed Dose Per Fraction: 2 Gy
Plan Total Fractions Prescribed: 6
Plan Total Prescribed Dose: 12 Gy
Reference Point Dosage Given to Date: 12 Gy
Reference Point Session Dosage Given: 2 Gy
Session Number: 21

## 2023-11-12 NOTE — Radiation Completion Notes (Addendum)
  Radiation Oncology         [phone] ________________________________  Name: Bonnie Torres MRN: [id number]  Date of Service: 11/11/2023  DOB: Jul 11, 1955  End of Treatment Note  Diagnosis: Stage IA (cT1c, N0, M0) Invasive ductal carcinoma of the right breast Intent: Curative     ==========DELIVERED PLANS==========  First Treatment Date: 2023-10-13 Last Treatment Date: 2023-11-11   Plan Name: Breast_R Site: Breast, Right Technique: 3D Mode: Photon Dose Per Fraction: 2.67 Gy Prescribed Dose (Delivered / Prescribed): 40.05 Gy / 40.05 Gy Prescribed Fxs (Delivered / Prescribed): 15 / 15   Plan Name: Breast_R_Bst Site: Breast, Right Technique: 3D Mode: Photon Dose Per Fraction: 2 Gy Prescribed Dose (Delivered / Prescribed): 12 Gy / 12 Gy Prescribed Fxs (Delivered / Prescribed): 6 / 6     ====================================   The patient tolerated radiation. She developed fatigue, right breast tenderness, and anticipated skin changes in the treatment field.   The patient will return in one month and will continue follow up with Dr. Gudena as well.      Amiel Kalata, PA-C

## 2023-12-02 ENCOUNTER — Encounter: Payer: Self-pay | Admitting: Radiation Oncology

## 2023-12-04 ENCOUNTER — Other Ambulatory Visit: Payer: Self-pay | Admitting: Physician Assistant

## 2023-12-04 DIAGNOSIS — N1831 Chronic kidney disease, stage 3a: Secondary | ICD-10-CM

## 2023-12-04 NOTE — Progress Notes (Signed)
 Radiation Oncology         [phone] ________________________________  Name: Bonnie Torres MRN: [id number]  Date: 12/06/2023  DOB: 1956-02-06  Follow-Up Visit Note  CC: Araceli Knight, PA-C  Breeback, Jade L, PA-C  No diagnosis found.  Diagnosis: Stage IA (cT1c, cN0, cM0) Right Breast LOQ, Invasive and in situ ductal carcinoma, ER+ / PR+ / Her2-, Grade 2  Interval Since Last Radiation: 25 days   Intent: Curative   Radiation Treatment Dates: First Treatment Date: 2023-10-13 -- Last Treatment Date: 2023-11-11  Site/Dose/Technique/Mode:  Site: Breast, Right Technique: 3D Mode: Photon Dose Per Fraction: 2.67 Gy Prescribed Dose (Delivered / Prescribed): 40.05 Gy / 40.05 Gy Prescribed Fxs (Delivered / Prescribed): 15 / 15   Site: Breast, Right - Boost  Technique: 3D Mode: Photon Dose Per Fraction: 2 Gy Prescribed Dose (Delivered / Prescribed): 12 Gy / 12 Gy Prescribed Fxs (Delivered / Prescribed): 6 / 6    Narrative:  The patient returns today for routine follow-up.  She tolerated radiation therapy relatively well overall other than fatigue, right breast tenderness, and anticipated skin changes in the treatment field.      She most recently followed up with Dr. Lee Public on 11/09/23. Based on Dr. Alix Aquas recommendations, she has opted to proceed with antiestrogen therapy consisting of anastrozole .               No other significant interval history since the patient completed radiation therapy.   ***            Allergies:  has no known allergies.  Meds: Current Outpatient Medications  Medication Sig Dispense Refill   anastrozole  (ARIMIDEX ) 1 MG tablet Take 1 tablet (1 mg total) by mouth daily. 90 tablet 3   atorvastatin  (LIPITOR) 80 MG tablet TAKE 1 TABLET BY MOUTH DAILY 100 tablet 2   metFORMIN  (GLUCOPHAGE -XR) 750 MG 24 hr tablet TAKE 1 TABLET BY MOUTH DAILY  WITH BREAKFAST 100 tablet 2   metoprolol  succinate (TOPROL -XL) 100 MG 24 hr tablet TAKE 1 TABLET BY MOUTH  DAILY 100 tablet 2   Olmesartan -amLODIPine -HCTZ 20-5-12.5 MG TABS TAKE 1 TABLET BY MOUTH DAILY 100 tablet 2   potassium chloride  SA (KLOR-CON  M) 20 MEQ tablet Take 1 tablet (20 mEq total) by mouth daily. 100 tablet 2   spironolactone  (ALDACTONE ) 100 MG tablet Take 100 mg by mouth daily.     tretinoin (RETIN-A) 0.025 % cream Apply topically.     zolpidem  (AMBIEN ) 10 MG tablet TAKE 1/2 TO 1 TABLET(5 TO 10 MG) BY MOUTH AT BEDTIME 90 tablet 1   No current facility-administered medications for this encounter.    Physical Findings: The patient is in no acute distress. Patient is alert and oriented.  vitals were not taken for this visit. .  No significant changes. Lungs are clear to auscultation bilaterally. Heart has regular rate and rhythm. No palpable cervical, supraclavicular, or axillary adenopathy. Abdomen soft, non-tender, normal bowel sounds.  Left Breast: no palpable mass, nipple discharge or bleeding. Right Breast: ***  Lab Findings: Lab Results  Component Value Date   WBC 5.4 08/11/2023   HGB 13.2 08/11/2023   HCT 37.7 08/11/2023   MCV 74.5 (L) 08/11/2023   PLT 303 08/11/2023    Radiographic Findings: No results found.  Impression: Stage IA (cT1c, cN0, cM0) Right Breast LOQ, Invasive and in situ ductal carcinoma, ER+ / PR+ / Her2-, Grade 2    The patient is recovering from the effects of radiation.  ***  Plan:  ***   *** minutes of total time was spent for this patient encounter, including preparation, face-to-face counseling with the patient and coordination of care, physical exam, and documentation of the encounter. ____________________________________  Noralee Beam, PhD, MD  This document serves as a record of services personally performed by Retta Caster, MD. It was created on his behalf by Aleta Anda, a trained medical scribe. The creation of this record is based on the scribe's personal observations and the provider's statements to them. This document has  been checked and approved by the attending provider.

## 2023-12-06 ENCOUNTER — Ambulatory Visit
Admission: RE | Admit: 2023-12-06 | Discharge: 2023-12-06 | Disposition: A | Discharge status: Home / Self Care | Source: Ambulatory Visit | Attending: Radiation Oncology | Admitting: Radiation Oncology

## 2023-12-06 ENCOUNTER — Encounter: Payer: Self-pay | Admitting: Radiation Oncology

## 2023-12-06 VITALS — BP 106/68 | HR 89 | Temp 97.9°F | Resp 18 | Ht <= 58 in | Wt 159.4 lb

## 2023-12-06 DIAGNOSIS — Z79899 Other long term (current) drug therapy: Secondary | ICD-10-CM | POA: Diagnosis not present

## 2023-12-06 DIAGNOSIS — Z79811 Long term (current) use of aromatase inhibitors: Secondary | ICD-10-CM | POA: Diagnosis not present

## 2023-12-06 DIAGNOSIS — Z7984 Long term (current) use of oral hypoglycemic drugs: Secondary | ICD-10-CM | POA: Insufficient documentation

## 2023-12-06 DIAGNOSIS — C50311 Malignant neoplasm of lower-inner quadrant of right female breast: Secondary | ICD-10-CM | POA: Diagnosis not present

## 2023-12-06 DIAGNOSIS — Z923 Personal history of irradiation: Secondary | ICD-10-CM | POA: Insufficient documentation

## 2023-12-06 DIAGNOSIS — Z170 Estrogen receptor positive status [ER+]: Secondary | ICD-10-CM | POA: Diagnosis not present

## 2023-12-06 HISTORY — DX: Personal history of irradiation: Z92.3

## 2023-12-06 NOTE — Progress Notes (Signed)
 Bonnie Torres is here today for follow up post radiation to the breast.   Breast Side: Right Side   They completed their radiation on: 11/11/23  Does the patient complain of any of the following: Post radiation skin issues:  Yes, she reports a spot under arm and under breast. Breast Tenderness: She reports tenderness to nipple area. Breast Swelling: Denies Lymphadema: Denies Range of Motion limitations: Denies Fatigue post radiation: Yes, she reports mild fatigue. Appetite good/fair/poor: Fair    BP 106/68 (BP Location: Left Arm, Patient Position: Sitting, Cuff Size: Large)   Pulse 89   Temp 97.9 F (36.6 C)   Resp 18   Ht 4\' 10"  (1.473 m)   Wt 159 lb 6.4 oz (72.3 kg)   SpO2 99%   BMI 33.31 kg/m

## 2023-12-08 DIAGNOSIS — L700 Acne vulgaris: Secondary | ICD-10-CM | POA: Diagnosis not present

## 2023-12-16 DIAGNOSIS — C50911 Malignant neoplasm of unspecified site of right female breast: Secondary | ICD-10-CM | POA: Diagnosis not present

## 2023-12-21 ENCOUNTER — Ambulatory Visit (INDEPENDENT_AMBULATORY_CARE_PROVIDER_SITE_OTHER): Admitting: Physician Assistant

## 2023-12-21 ENCOUNTER — Encounter: Payer: Self-pay | Admitting: Physician Assistant

## 2023-12-21 VITALS — BP 118/57 | HR 81 | Ht <= 58 in | Wt 156.0 lb

## 2023-12-21 DIAGNOSIS — E1122 Type 2 diabetes mellitus with diabetic chronic kidney disease: Secondary | ICD-10-CM

## 2023-12-21 DIAGNOSIS — C50311 Malignant neoplasm of lower-inner quadrant of right female breast: Secondary | ICD-10-CM

## 2023-12-21 DIAGNOSIS — Z170 Estrogen receptor positive status [ER+]: Secondary | ICD-10-CM | POA: Diagnosis not present

## 2023-12-21 DIAGNOSIS — I10 Essential (primary) hypertension: Secondary | ICD-10-CM

## 2023-12-21 DIAGNOSIS — Z85528 Personal history of other malignant neoplasm of kidney: Secondary | ICD-10-CM | POA: Insufficient documentation

## 2023-12-21 DIAGNOSIS — Z7984 Long term (current) use of oral hypoglycemic drugs: Secondary | ICD-10-CM

## 2023-12-21 DIAGNOSIS — E785 Hyperlipidemia, unspecified: Secondary | ICD-10-CM | POA: Diagnosis not present

## 2023-12-21 DIAGNOSIS — F5101 Primary insomnia: Secondary | ICD-10-CM

## 2023-12-21 DIAGNOSIS — L249 Irritant contact dermatitis, unspecified cause: Secondary | ICD-10-CM | POA: Diagnosis not present

## 2023-12-21 DIAGNOSIS — N1831 Chronic kidney disease, stage 3a: Secondary | ICD-10-CM | POA: Diagnosis not present

## 2023-12-21 DIAGNOSIS — C480 Malignant neoplasm of retroperitoneum: Secondary | ICD-10-CM | POA: Insufficient documentation

## 2023-12-21 DIAGNOSIS — E876 Hypokalemia: Secondary | ICD-10-CM

## 2023-12-21 LAB — POCT GLYCOSYLATED HEMOGLOBIN (HGB A1C): Hemoglobin A1C: 6.2 % — AB (ref 4.0–5.6)

## 2023-12-21 MED ORDER — METOPROLOL SUCCINATE ER 100 MG PO TB24: 100.0000 mg | ORAL_TABLET | Freq: Every day | ORAL | 3 refills | Status: AC

## 2023-12-21 MED ORDER — TRIAMCINOLONE ACETONIDE 0.1 % EX CREA: 1.0000 | TOPICAL_CREAM | Freq: Two times a day (BID) | CUTANEOUS | 0 refills | Status: AC

## 2023-12-21 MED ORDER — ATORVASTATIN CALCIUM 80 MG PO TABS: 80.0000 mg | ORAL_TABLET | Freq: Every day | ORAL | 3 refills | Status: AC

## 2023-12-21 MED ORDER — OLMESARTAN-AMLODIPINE-HCTZ 20-5-12.5 MG PO TABS: 1.0000 | ORAL_TABLET | Freq: Every day | ORAL | 3 refills | Status: AC

## 2023-12-21 NOTE — Progress Notes (Unsigned)
 Established Patient Office Visit  Subjective   Patient ID: Bonnie Torres, female    DOB: 1956/03/16  Age: 68 y.o. MRN: [id number]  Chief Complaint  Patient presents with   Medical Management of Chronic Issues    Last A1c 5.9    HPI Pt is a 68 yo obese female with T2DM, HTN, CKD 3a, HLD, breast cancer on the right, hx of renal cell carcinoma left partial kidney resection who presents to the clinic to follow up medication refills.   Pt is checking her sugars and getting tired of the daily sticks. Most of her sugars are under 130 in the mornings. She denies any hypoglycemic events. She denies any CP, palpitations, headaches or vision changes. She is active but not exercising. She helps her family out a lot. She works in [employer] and very active when working. She sees nephrology once a year. She continues follow up with oncology but in remission. She is done with radiation and will follow up every 3 months now. Pt is very compliant with all medication.   Pt does have recurrent itchy rash around neck for last few weeks. She is not putting anything on it. She denies any new detergent. Worse when she gets hot and sweaty.     ROS See HPI.    Objective:     BP (!) 118/57   Pulse 81   Ht 4' 10 (1.473 m)   Wt 156 lb (70.8 kg)   SpO2 100%   BMI 32.60 kg/m  BP Readings from Last 3 Encounters:  12/21/23 (!) 118/57  12/06/23 106/68  11/09/23 118/79   Wt Readings from Last 3 Encounters:  12/21/23 156 lb (70.8 kg)  12/06/23 159 lb 6.4 oz (72.3 kg)  11/09/23 159 lb 8 oz (72.3 kg)      Physical Exam Constitutional:      Appearance: Normal appearance. She is obese.  HENT:     Head: Normocephalic.   Cardiovascular:     Rate and Rhythm: Normal rate and regular rhythm.  Pulmonary:     Effort: Pulmonary effort is normal.     Breath sounds: Normal breath sounds.   Skin:    Comments: Small flesh colored papules around neck with some hyperpigmentation noted.     Neurological:     General: No focal deficit present.     Mental Status: She is alert.   Psychiatric:        Mood and Affect: Mood normal.      Results for orders placed or performed in visit on 12/21/23  POCT HgB A1C  Result Value Ref Range   Hemoglobin A1C 6.2 (A) 4.0 - 5.6 %   HbA1c POC (<> result, manual entry)     HbA1c, POC (prediabetic range)     HbA1c, POC (controlled diabetic range)      Last metabolic panel Lab Results  Component Value Date   GLUCOSE 109 (H) 10/13/2023   NA 139 10/13/2023   K 4.8 10/13/2023   CL 102 10/13/2023   CO2 19 (L) 10/13/2023   BUN 22 10/13/2023   CREATININE 1.21 (H) 10/13/2023   EGFR 49 (L) 10/13/2023   CALCIUM  10.1 10/13/2023   PROT 7.2 10/13/2023   ALBUMIN  4.5 10/13/2023   LABGLOB 2.7 10/13/2023   BILITOT 0.3 10/13/2023   ALKPHOS 92 10/13/2023   AST 18 10/13/2023   ALT 13 10/13/2023   ANIONGAP 6 08/11/2023      The 10-year ASCVD risk score (Arnett DK,  et al., 2019) is: 14.7%    Assessment & Plan:  SABRASABRAFaith was seen today for medical management of chronic issues.  Diagnoses and all orders for this visit:  Type 2 diabetes mellitus with stage 3a chronic kidney disease, without long-term current use of insulin  (HCC) -     POCT HgB A1C -     atorvastatin  (LIPITOR) 80 MG tablet; Take 1 tablet (80 mg total) by mouth daily.  Hyperlipidemia LDL goal <70 -     atorvastatin  (LIPITOR) 80 MG tablet; Take 1 tablet (80 mg total) by mouth daily.  Primary insomnia  Essential hypertension, benign -     Olmesartan -amLODIPine -HCTZ 20-5-12.5 MG TABS; Take 1 tablet by mouth daily. -     metoprolol  succinate (TOPROL -XL) 100 MG 24 hr tablet; Take 1 tablet (100 mg total) by mouth daily.  History of renal cell carcinoma  Malignant neoplasm of lower-inner quadrant of right breast of female, estrogen receptor positive (HCC)  Retroperitoneal sarcoma (HCC)  Irritant contact dermatitis, unspecified trigger -     triamcinolone  cream  (KENALOG ) 0.1 %; Apply 1 Application topically 2 (two) times daily.   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     Return in about 3 months (around 03/22/2024).    Shelia Magallon, PA-C

## 2023-12-22 ENCOUNTER — Encounter: Payer: Self-pay | Admitting: Physician Assistant

## 2024-01-26 DIAGNOSIS — C50911 Malignant neoplasm of unspecified site of right female breast: Secondary | ICD-10-CM | POA: Diagnosis not present

## 2024-02-08 ENCOUNTER — Encounter: Payer: Self-pay | Admitting: Adult Health

## 2024-02-08 ENCOUNTER — Inpatient Hospital Stay: Attending: Radiation Oncology | Admitting: Adult Health

## 2024-02-08 VITALS — BP 107/61 | HR 77 | Temp 97.1°F | Ht <= 58 in | Wt 156.6 lb

## 2024-02-08 DIAGNOSIS — Z800 Family history of malignant neoplasm of digestive organs: Secondary | ICD-10-CM | POA: Diagnosis not present

## 2024-02-08 DIAGNOSIS — Z79811 Long term (current) use of aromatase inhibitors: Secondary | ICD-10-CM | POA: Diagnosis not present

## 2024-02-08 DIAGNOSIS — Z170 Estrogen receptor positive status [ER+]: Secondary | ICD-10-CM | POA: Diagnosis not present

## 2024-02-08 DIAGNOSIS — Z8041 Family history of malignant neoplasm of ovary: Secondary | ICD-10-CM | POA: Diagnosis not present

## 2024-02-08 DIAGNOSIS — C50311 Malignant neoplasm of lower-inner quadrant of right female breast: Secondary | ICD-10-CM | POA: Diagnosis not present

## 2024-02-08 DIAGNOSIS — Z803 Family history of malignant neoplasm of breast: Secondary | ICD-10-CM | POA: Insufficient documentation

## 2024-02-08 NOTE — Progress Notes (Signed)
 SURVIVORSHIP VISIT:  BRIEF ONCOLOGIC HISTORY:  Oncology History  Malignant neoplasm of lower-inner quadrant of right breast of female, estrogen receptor positive (HCC)  08/03/2023 Initial Diagnosis   Screening mammogram detected right breast mass with calcifications LIQ posterior at 5:00: 1.9 cm ultrasound biopsy: Grade 2 IDC with intermediate grade DCIS with necrosis axilla negative, ER 95%, PR 95%, Ki67 10%, HER2 negative by FISH   08/11/2023 Cancer Staging   Staging form: Breast, AJCC 8th Edition - Clinical stage from 08/11/2023: Stage IA (cT1c, cN0, cM0, G2, ER+, PR+, HER2-) - Signed by Odean Potts, MD on 08/11/2023 Stage prefix: Initial diagnosis Histologic grading system: 3 grade system   08/20/2023 Genetic Testing   Negative [facility] CancerNext-Expanded +RNAinsight Panel.  VUS in POLE at p.M1212V (c.3634A>G).  Report date is 08/20/2023.   The CancerNext-Expanded gene panel offered by [facility] and includes sequencing, rearrangement, and RNA analysis for the following 76 genes: AIP, ALK, APC, ATM, AXIN2, BAP1, BARD1, BMPR1A, BRCA1, BRCA2, BRIP1, CDC73, CDH1, CDK4, CDKN1B, CDKN2A, CEBPA, CHEK2, CTNNA1, DDX41, DICER1, ETV6, FH, FLCN, GATA2, LZTR1, MAX, MBD4, MEN1, MET, MLH1, MSH2, MSH3, MSH6, MUTYH, NF1, NF2, NTHL1, PALB2, PHOX2B, PMS2, POT1, PRKAR1A, PTCH1, PTEN, RAD51C, RAD51D, RB1, RET, RUNX1, SDHA, SDHAF2, SDHB, SDHC, SDHD, SMAD4, SMARCA4, SMARCB1, SMARCE1, STK11, SUFU, TMEM127, TP53, TSC1, TSC2, VHL, and WT1 (sequencing and deletion/duplication); EGFR, HOXB13, KIT, MITF, PDGFRA, POLD1, and POLE (sequencing only); EPCAM and GREM1 (deletion/duplication only).    09/06/2023 Surgery   Right lumpectomy: 09/06/2023: Grade 2 IDC 1.7 cm with DCIS, margins negative, LVI not identified, ER 95%, PR 95%, HER2 negative, Ki-67 10%    09/06/2023 Oncotype testing   11 (3% risk of distant recurrence in 9 years)   10/13/2023 - 11/11/2023 Radiation Therapy   Plan Name: Breast_R Site: Breast,  Right Technique: 3D Mode: Photon Dose Per Fraction: 2.67 Gy Prescribed Dose (Delivered / Prescribed): 40.05 Gy / 40.05 Gy Prescribed Fxs (Delivered / Prescribed): 15 / 15   Plan Name: Breast_R_Bst Site: Breast, Right Technique: 3D Mode: Photon Dose Per Fraction: 2 Gy Prescribed Dose (Delivered / Prescribed): 12 Gy / 12 Gy Prescribed Fxs (Delivered / Prescribed): 6 / 6   11/2023 -  Anti-estrogen oral therapy   1 mg Anastrozole  x 5 years     INTERVAL HISTORY:  Discussed the use of AI scribe software for clinical note transcription with the patient, who gave verbal consent to proceed.  History of Present Illness Bonnie Torres is a 68 year old female with right-sided stage 1A estrogen and progesterone positive breast cancer who presents with appetite changes and fatigue following radiation therapy and anastrozole  treatment.  She experiences difficulty with appetite and fatigue since completing radiation therapy and starting anastrozole . Despite challenges in stimulating her appetite, her weight remains stable at approximately 156 pounds. She occasionally goes most of the day without eating. There are no hot flashes, vaginal dryness, bone loss, joint aches, hair thinning, or heart issues.  She was diagnosed with right-sided stage 1A estrogen and progesterone positive breast cancer and underwent a lumpectomy without lymph node removal. Radiation therapy was completed a few months ago.  Her past medical history includes renal cell carcinoma with surgeries in 2017 and 2021, both benign. Genetic testing was negative.    REVIEW OF SYSTEMS:  Review of Systems  Constitutional:  Positive for fatigue. Negative for appetite change, chills, fever and unexpected weight change.  HENT:   Negative for hearing loss, lump/mass and trouble swallowing.   Eyes:  Negative for eye problems  and icterus.  Respiratory:  Negative for chest tightness, cough and shortness of breath.   Cardiovascular:   Negative for chest pain, leg swelling and palpitations.  Gastrointestinal:  Negative for abdominal distention, abdominal pain, constipation, diarrhea, nausea and vomiting.  Endocrine: Negative for hot flashes.  Genitourinary:  Negative for difficulty urinating.   Musculoskeletal:  Negative for arthralgias.  Skin:  Negative for itching and rash.  Neurological:  Negative for dizziness, extremity weakness, headaches and numbness.  Hematological:  Negative for adenopathy. Does not bruise/bleed easily.  Psychiatric/Behavioral:  Negative for depression. The patient is not nervous/anxious.   Breast: Denies any new nodularity, masses, tenderness, nipple changes, or nipple discharge.       PAST MEDICAL/SURGICAL HISTORY:  Past Medical History:  Diagnosis Date   Breast cancer (HCC)    Complication of anesthesia    Gout    1 yr ago right great toe   Heart rate fast    tx Metoprolol    History of radiation therapy    Right breast-10/13/23-11/11/23- Dr. 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   Type 2 diabetes mellitus without complication, without long-term current use of insulin  (HCC) 12/03/2017   Past Surgical History:  Procedure Laterality Date   ABDOMINAL HYSTERECTOMY N/A 06/18/2016   Procedure: HYSTERECTOMY ABDOMINAL TOTAL;  Surgeon: Maurilio Ship, MD;  Location: [facility];  Service: Gynecology;  Laterality: N/A;   BREAST BIOPSY Right 08/02/2023   US  RT BREAST BX W LOC DEV 1ST LESION IMG BX SPEC US  GUIDE 08/02/2023 [facility]   BREAST BIOPSY  09/03/2023   MM RT RADIOACTIVE SEED EA ADD LESION LOC MAMMO GUIDE 09/03/2023 [facility]MO[facility]   BREAST BIOPSY  09/03/2023   MM RT RADIOACTIVE SEED LOC MAMMO GUIDE 09/03/2023 [facility]   BREAST EXCISIONAL BIOPSY Right    BREAST LUMPECTOMY WITH RADIOACTIVE SEED LOCALIZATION Right 09/06/2023   Procedure: RIGHT BREAST SEED BRACKETED LUMPECTOMY;  Surgeon:  Ebbie Cough, MD;  Location: [facility];  Service: General;  Laterality: Right;   BREAST SURGERY Right    breast biopsy-benign   CHOLECYSTECTOMY     MASS EXCISION N/A 06/18/2016   Procedure: RESECTION RETROPERITONEAL MASS WITH RETORPERITONEAL EXPLORATION ,LIGATION CHYLI CISTERNA,OPEN  CHOLECYSTECTOMY WITH MOBILATION HEPATIC FLEXURE;  Surgeon: Zada JAYSON Hoard, MD;  Location: [facility];  Service: General;  Laterality: N/A;   PARTIAL NEPHRECTOMY Right 06/18/2016   Procedure: OPEN RIGHT NEPHRECTOMY PARTIAL;  Surgeon: Gretel Ferrara, MD;  Location: [facility];  Service: Urology;  Laterality: Right;   ROBOTIC ASSITED PARTIAL NEPHRECTOMY Left 11/30/2019   Procedure: ATTEMPTED XI ROBOTIC ASSITED LAPAROSCOPIC CONVERTED TO OPEN PARTIAL NEPHRECTOMY;  Surgeon: Ferrara Gretel, MD;  Location: [facility];  Service: Urology;  Laterality: Left;   SALPINGOOPHORECTOMY Bilateral 06/18/2016   Procedure: BILATERAL SALPINGO OOPHORECTOMY;  Surgeon: Maurilio Ship, MD;  Location: [facility];  Service: Gynecology;  Laterality: Bilateral;   SHOULDER SURGERY Right 2010   TOE SURGERY Right Hammer Toe   2004   TONSILLECTOMY       ALLERGIES:  No Known Allergies   CURRENT MEDICATIONS:  Outpatient Encounter Medications as of 02/08/2024  Medication Sig   anastrozole  (ARIMIDEX ) 1 MG tablet Take 1 tablet (1 mg total) by mouth daily.   atorvastatin  (LIPITOR) 80 MG tablet Take 1 tablet (80 mg total) by mouth daily.   metFORMIN  (GLUCOPHAGE -XR) 750 MG 24 hr tablet TAKE 1 TABLET BY MOUTH DAILY  WITH BREAKFAST  metoprolol  succinate (TOPROL -XL) 100 MG 24 hr tablet Take 1 tablet (100 mg total) by mouth daily.   Olmesartan -amLODIPine -HCTZ 20-5-12.5 MG TABS Take 1 tablet by mouth daily.   ONETOUCH VERIO test strip CHECK BLOOD GLUCOSE TWICE DAILY  AS DIRECTED AS NEEDED   potassium chloride  SA (KLOR-CON  M) 20 MEQ tablet Take 1 tablet (20 mEq total) by mouth daily.   spironolactone  (ALDACTONE ) 100 MG tablet Take 100 mg by mouth  daily.   tretinoin (RETIN-A) 0.025 % cream Apply topically.   triamcinolone  cream (KENALOG ) 0.1 % Apply 1 Application topically 2 (two) times daily.   zolpidem  (AMBIEN ) 10 MG tablet TAKE 1/2 TO 1 TABLET(5 TO 10 MG) BY MOUTH AT BEDTIME   No facility-administered encounter medications on file as of 02/08/2024.     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 She has one child (daughter). She enjoys spending time with her daughter and her mom.    Social Drivers of Corporate investment banker Strain: Low Risk  (08/03/2023)   Overall Financial Resource Strain (CARDIA)    Difficulty of Paying Living Expenses: Not hard at all  Food Insecurity: No Food Insecurity (10/06/2023)   Hunger Vital Sign    Worried About Running Out of Food in the Last Year: Never true    Ran Out of Food in the Last Year: Never true  Transportation Needs: No Transportation Needs (12/14/2023)   PRAPARE - Administrator, Civil Service (Medical): No    Lack of Transportation  (Non-Medical): No  Physical Activity: Unknown (12/14/2023)   Exercise Vital Sign    Days of Exercise per Week: 7 days    Minutes of Exercise per Session: Not on file  Stress: Stress Concern Present (12/14/2023)   [employer] of Occupational Health - Occupational Stress Questionnaire    Feeling of Stress: To some extent  Social Connections: Moderately Integrated (12/14/2023)   Social Connection and Isolation Panel    Frequency of Communication with Friends and Family: Three times a week    Frequency of Social Gatherings with Friends and Family: Once a week    Attends Religious Services: 1 to 4 times per year    Active Member of [employer] or Organizations: Yes    Attends Banker Meetings: More than 4 times per year  Marital Status: Widowed  Intimate Partner Violence: Not At Risk (10/06/2023)   Humiliation, Afraid, Rape, and Kick questionnaire    Fear of Current or Ex-Partner: No    Emotionally Abused: No    Physically Abused: No    Sexually Abused: No     OBSERVATIONS/OBJECTIVE:  BP 107/61 (BP Location: Left Arm, Patient Position: Sitting)   Pulse 77   Temp (!) 97.1 F (36.2 C) (Temporal)   Ht 4' 10 (1.473 m)   Wt 156 lb 9.6 oz (71 kg)   SpO2 100%   BMI 32.73 kg/m  GENERAL: Patient is a well appearing female in no acute distress HEENT:  Sclerae anicteric.  Oropharynx clear and moist. No ulcerations or evidence of oropharyngeal candidiasis. Neck is supple.  NODES:  No cervical, supraclavicular, or axillary lymphadenopathy palpated.  BREAST EXAM:  right breast s/p lumpectomy and radiation, no sign of local recurrence, left breast benign LUNGS:  Clear to auscultation bilaterally.  No wheezes or rhonchi. HEART:  Regular rate and rhythm. No murmur appreciated. ABDOMEN:  Soft, nontender.  Positive, normoactive bowel sounds. No organomegaly palpated. MSK:  No focal spinal tenderness to palpation. Full range of motion bilaterally in the upper extremities. EXTREMITIES:   No peripheral edema.   SKIN:  Clear with no obvious rashes or skin changes. No nail dyscrasia. NEURO:  Nonfocal. Well oriented.  Appropriate affect.   LABORATORY DATA:  None for this visit.  DIAGNOSTIC IMAGING:  None for this visit.      ASSESSMENT AND PLAN:  Ms.. Kiernan is a pleasant 68 y.o. female with Stage IA right breast invasive ductal carcinoma, ER+/PR+/HER2-, diagnosed in 07/2023, treated with lumpectomy, adjuvant radiation therapy, and anti-estrogen therapy with Anastrozole  beginning in 11/2023.  She presents to the Survivorship Clinic for our initial meeting and routine follow-up post-completion of treatment for breast cancer.    1. Stage IA right breast cancer:  Ms. Dauber is continuing to recover from definitive treatment for breast cancer. She will follow-up with her medical oncologist, Dr.  Odean in 6 months with history and physical exam per surveillance protocol.  She will continue her anti-estrogen therapy with Anastrozole . Thus far, she is tolerating the Anastrozole  well, with minimal side effects. Her mammogram is due 06/2024; orders placed today.   Today, a comprehensive survivorship care plan and treatment summary was reviewed with the patient today detailing her breast cancer diagnosis, treatment course, potential late/long-term effects of treatment, appropriate follow-up care with recommendations for the future, and patient education resources.  A copy of this summary, along with a letter will be sent to the patient's primary care provider via mail/fax/In Basket message after today's visit.    2. Bone health:  Given Ms. Loudon's age/history of breast cancer and her current treatment regimen including anti-estrogen therapy with Anastrozole , she is at risk for bone demineralization.  Her last DEXA scan was 06/2020 and was normal. Repeat is recommended every 2 years.  I placed orders today for repeat bone density testing to occur sometime in the next few weeks. She was given  education on specific activities to promote bone health.  3. Cancer screening:  Due to Ms. Speyer's history and her age, she should receive screening for skin cancers, colon cancer, and gynecologic cancers.  The information and recommendations are listed on the patient's comprehensive care plan/treatment summary and were reviewed in detail with the patient.    4. Health maintenance and wellness promotion: Ms. Moraes was encouraged to consume 5-7 servings of fruits and vegetables  per day. We reviewed the Nutrition Rainbow handout.  She was also encouraged to engage in moderate to vigorous exercise for 30 minutes per day most days of the week.  She was instructed to limit her alcohol consumption and continue to abstain from tobacco use.     5. Support services/counseling: It is not uncommon for this period of the patient's cancer care trajectory to be one of many emotions and stressors.   She was given information regarding our available services and encouraged to contact me with any questions or for help enrolling in any of our support group/programs.    Follow up instructions:    -Return to cancer center 6 months for f/u with Dr. Odean  -Mammogram due in 06/2024 -DEXA ordered  -She is welcome to return back to the Survivorship Clinic at any time; no additional follow-up needed at this time.  -Consider referral back to survivorship as a long-term survivor for continued surveillance  The patient was provided an opportunity to ask questions and all were answered. The patient agreed with the plan and demonstrated an understanding of the instructions.   Total encounter time:45 minutes*in face-to-face visit time, chart review, lab review, care coordination, order entry, and documentation of the encounter time.    Morna Kendall, NP 02/08/24 3:35 PM Medical Oncology and Hematology [facility] [address] [place], [place] [address] Tel. [phone]    Fax.  [phone]  *Total Encounter Time as defined by the Centers for Medicare and Medicaid Services includes, in addition to the face-to-face time of a patient visit (documented in the note above) non-face-to-face time: obtaining and reviewing outside history, ordering and reviewing medications, tests or procedures, care coordination (communications with other health care professionals or caregivers) and documentation in the medical record.

## 2024-02-14 ENCOUNTER — Other Ambulatory Visit: Payer: Self-pay | Admitting: Physician Assistant

## 2024-02-14 DIAGNOSIS — E119 Type 2 diabetes mellitus without complications: Secondary | ICD-10-CM

## 2024-02-14 DIAGNOSIS — E1122 Type 2 diabetes mellitus with diabetic chronic kidney disease: Secondary | ICD-10-CM

## 2024-03-21 ENCOUNTER — Encounter: Payer: Self-pay | Admitting: Physician Assistant

## 2024-03-21 ENCOUNTER — Ambulatory Visit (INDEPENDENT_AMBULATORY_CARE_PROVIDER_SITE_OTHER): Admitting: Physician Assistant

## 2024-03-21 VITALS — BP 104/56 | HR 75 | Temp 97.8°F | Ht <= 58 in | Wt 152.0 lb

## 2024-03-21 DIAGNOSIS — Z7984 Long term (current) use of oral hypoglycemic drugs: Secondary | ICD-10-CM | POA: Diagnosis not present

## 2024-03-21 DIAGNOSIS — J302 Other seasonal allergic rhinitis: Secondary | ICD-10-CM

## 2024-03-21 DIAGNOSIS — E1122 Type 2 diabetes mellitus with diabetic chronic kidney disease: Secondary | ICD-10-CM | POA: Diagnosis not present

## 2024-03-21 DIAGNOSIS — F5101 Primary insomnia: Secondary | ICD-10-CM

## 2024-03-21 DIAGNOSIS — N1831 Chronic kidney disease, stage 3a: Secondary | ICD-10-CM | POA: Diagnosis not present

## 2024-03-21 DIAGNOSIS — E66811 Obesity, class 1: Secondary | ICD-10-CM

## 2024-03-21 DIAGNOSIS — Z23 Encounter for immunization: Secondary | ICD-10-CM

## 2024-03-21 DIAGNOSIS — R252 Cramp and spasm: Secondary | ICD-10-CM | POA: Diagnosis not present

## 2024-03-21 DIAGNOSIS — C50919 Malignant neoplasm of unspecified site of unspecified female breast: Secondary | ICD-10-CM | POA: Diagnosis not present

## 2024-03-21 DIAGNOSIS — E119 Type 2 diabetes mellitus without complications: Secondary | ICD-10-CM

## 2024-03-21 DIAGNOSIS — E6609 Other obesity due to excess calories: Secondary | ICD-10-CM | POA: Insufficient documentation

## 2024-03-21 DIAGNOSIS — Z6831 Body mass index (BMI) 31.0-31.9, adult: Secondary | ICD-10-CM

## 2024-03-21 LAB — POCT GLYCOSYLATED HEMOGLOBIN (HGB A1C): HbA1c, POC (controlled diabetic range): 5.7 % (ref 0.0–7.0)

## 2024-03-21 LAB — POCT UA - MICROALBUMIN
Albumin/Creatinine Ratio, Urine, POC: <30
Creatinine, POC: 100 mg/dL
Microalbumin Ur, POC: 10 mg/L

## 2024-03-21 MED ORDER — BLOOD GLUCOSE MONITORING SUPPL DEVI: 1.0000 | Freq: Three times a day (TID) | 0 refills | Status: AC

## 2024-03-21 MED ORDER — ZOLPIDEM TARTRATE 10 MG PO TABS
ORAL_TABLET | ORAL | 1 refills | Status: AC
Start: 2024-03-21 — End: ?

## 2024-03-21 MED ORDER — LEVOCETIRIZINE DIHYDROCHLORIDE 5 MG PO TABS: 5.0000 mg | ORAL_TABLET | Freq: Every evening | ORAL | 4 refills | Status: AC

## 2024-03-21 MED ORDER — LANCET DEVICE MISC: 1.0000 | Freq: Three times a day (TID) | 0 refills | Status: AC

## 2024-03-21 MED ORDER — LANCETS MISC. MISC: 1.0000 | Freq: Three times a day (TID) | 0 refills | Status: AC

## 2024-03-21 MED ORDER — BLOOD GLUCOSE TEST VI STRP: 1.0000 | ORAL_STRIP | Freq: Three times a day (TID) | 0 refills | Status: DC

## 2024-03-21 NOTE — Progress Notes (Signed)
 Established Patient Office Visit  Subjective   Patient ID: Bonnie Torres, female    DOB: [birth year]  Age: 68 y.o. MRN: [id number]   HPI Discussed the use of AI scribe software for clinical note transcription with the patient, who gave verbal consent to proceed.  History of Present Illness Bonnie Torres is a 68 year old female with diabetes and breast cancer who presents for a follow-up visit.  Glycemic control - Diabetes management shows improvement with most recent hemoglobin A1c of 5.7%. - Occasionally consumes sugar but generally maintains blood glucose levels. - Last blood glucose readings were in the 80s, checked approximately one and a half weeks ago.  Blood pressure monitoring - Blood pressure is slightly low. - No dizziness present. - Monitors blood pressure at home but has not done so recently.  Rhinorrhea and headache - Persistent runny nose with clear discharge for over one month. - Occasional mild headaches. - Attributes symptoms to possible allergies. - Hesitant to use nasal sprays or over-the-counter allergy medications due to current medication regimen, including anastrozole .  Muscle cramping - Cramping in hands and feet, occurring both during the day and at night. - Cramping began prior to initiation of current breast cancer medication. - Attempting to increase water  intake. - Decreased appetite since starting radiation and anastrozole  for breast cancer. - Concerned about potential kidney damage from magnesium  supplements, which were previously suggested for cramping.  Breast cancer treatment - Currently taking anastrozole  for breast cancer.      ROS See HPI.    Objective:     BP (!) 104/56 (BP Location: Right Arm, Patient Position: Sitting, Cuff Size: Normal)   Pulse 75   Temp 97.8 F (36.6 C) (Oral)   Ht 4' 10 (1.473 m)   Wt 152 lb (68.9 kg)   SpO2 99%   BMI 31.77 kg/m  BP Readings from Last 3 Encounters:  03/21/24 (!) 104/56   02/08/24 107/61  12/21/23 (!) 118/57   Wt Readings from Last 3 Encounters:  03/21/24 152 lb (68.9 kg)  02/08/24 156 lb 9.6 oz (71 kg)  12/21/23 156 lb (70.8 kg)      Physical Exam Constitutional:      Appearance: Normal appearance. She is obese.  HENT:     Head: Normocephalic.     Nose: Nose normal. No congestion or rhinorrhea.     Mouth/Throat:     Mouth: Mucous membranes are moist.     Pharynx: No oropharyngeal exudate or posterior oropharyngeal erythema.  Eyes:     Conjunctiva/sclera: Conjunctivae normal.  Cardiovascular:     Rate and Rhythm: Normal rate and regular rhythm.  Pulmonary:     Effort: Pulmonary effort is normal.     Breath sounds: Normal breath sounds.  Musculoskeletal:     Cervical back: Normal range of motion and neck supple. No tenderness.     Right lower leg: No edema.     Left lower leg: No edema.  Lymphadenopathy:     Cervical: No cervical adenopathy.  Neurological:     General: No focal deficit present.     Mental Status: She is alert and oriented to person, place, and time.  Psychiatric:        Mood and Affect: Mood normal.        Behavior: Behavior normal.      .. Results for orders placed or performed in visit on 03/21/24  POCT HgB A1C   Collection Time: 03/21/24  3:35 PM  Result Value  Ref Range   Hemoglobin A1C     HbA1c POC (<> result, manual entry)     HbA1c, POC (prediabetic range)     HbA1c, POC (controlled diabetic range) 5.7 0.0 - 7.0 %  POCT UA - Microalbumin   Collection Time: 03/21/24  3:36 PM  Result Value Ref Range   Microalbumin Ur, POC 10 mg/L   Creatinine, POC 100 mg/dL   Albumin /Creatinine Ratio, Urine, POC <30      The 10-year ASCVD risk score (Arnett DK, et al., 2019) is: 11.2%    Assessment & Plan:   Assessment & Plan Type 2 diabetes mellitus with stage 3a chronic kidney disease A1c improved to 5.7. Blood glucose levels low, around 80s. Glucometer malfunctioning, insurance may not cover current  device. - Provide new glucometer covered by insurance. - Ensure medication refills are up to date. - BP to goal - on statin  - eye exam UTD  Essential hypertension Blood pressure slightly low, no dizziness. Inconsistent home monitoring. - Continue current antihypertensive regimen. - Advise home blood pressure monitoring, report low readings or dizziness.  Muscle cramps of hands and feet Cramps in hands and feet, day and night. Cramping predates anastrozole . Concerns about magnesium  due to kidney. - Order labs for sodium, potassium, vitamin D . - Discuss potential use of magnesium  cream. - Make sure staying hydrated   Allergic rhinitis Clear nasal discharge, slight headache over a month, likely allergies. Prefers no nasal sprays, concerned about drug interactions. - Prescribe safe oral allergy medication - Xyzal  was sent to pharmacy  Breast cancer on anastrozole  therapy On anastrozole  therapy, concerns about interactions with OTC drugs.  General Health Maintenance Due for flu and COVID vaccinations. - Administer flu vaccine. - Administer COVID vaccine.     Return in about 3 months (around 06/20/2024).    Samoria Fedorko, PA-C

## 2024-03-21 NOTE — Patient Instructions (Addendum)
 Xyzal  to start for allergies.   Muscle Cramps and Spasms Muscle cramps and spasms occur when a muscle or muscles tighten and you have no control over this tightening (involuntary muscle contraction). They are a common problem that can happen in any muscle. The most common place is in the calf muscles of the leg. There are a few ways that muscle cramps and spasms differ: Muscle cramps are painful. They come and go and may last for a few seconds or up to 15 minutes. Muscle cramps are often more forceful and last longer than muscle spasms. Muscle spasms may or may not be painful. They may last just a few seconds or last much longer. Certain conditions, such as diabetes or Parkinson's disease, can make you more likely to have cramps or spasms. But in most cases, cramps and spasms are not caused by other conditions. Common causes include: Overexertion. This is when you do more physical work or exercise than your body is ready for. Overuse from doing the same movements too many times. Staying in one position for too long. Improper preparation, form, or technique when playing a sport or doing an activity. Not enough water  or other fluids in your body (dehydration). Other causes may include: Injury. Side effects of some medicines. Too few salts and minerals in your body (electrolytes), such as potassium and calcium . This could happen if you are taking water  pills (diuretics) or if you are pregnant. In many cases, the cause of muscle cramps or spasms is not known. Follow these instructions at home: Eating and drinking Drink enough fluid to keep your pee (urine) pale yellow. This can help prevent cramps or spasms. Eat a healthy diet that includes a lot of nutrients to help your muscles work. A healthy diet includes fruits and vegetables, lean protein, whole grains, and low-fat or nonfat dairy products. Managing pain and stiffness     Try to massage, stretch, and relax the affected muscle. Do this for  a few minutes at a time. If told, put ice on the muscles. This may help if you are sore or have pain after a cramp or spasm. Put ice in a plastic bag. Place a towel between your skin and the bag. Leave the ice on for 20 minutes, 2-3 times a day. If told, apply heat to tight or tense muscles as often as told by your health care provider. Use the heat source that your provider recommends, such as a moist heat pack or a heating pad. Place a towel between your skin and the heat source. Leave the heat on for 20-30 minutes. If your skin turns bright red, remove the ice or heat right away to prevent skin damage. The risk of damage is higher if you cannot feel pain, heat, or cold. Take hot showers or baths to help relax tight muscles. General instructions If you are having cramps often, avoid intense exercise for a few days. Take over-the-counter and prescription medicines only as told by your provider. Watch for any changes in your symptoms. Contact a health care provider if: Your cramps or spasms get more severe or happen more often. Your cramps or spasms do not get better over time. This information is not intended to replace advice given to you by your health care provider. Make sure you discuss any questions you have with your health care provider. Document Revised: 02/03/2022 Document Reviewed: 02/03/2022 Elsevier Patient Education  2024 [employer].

## 2024-03-22 ENCOUNTER — Ambulatory Visit: Payer: Self-pay | Admitting: Physician Assistant

## 2024-03-22 LAB — B12 AND FOLATE PANEL
Folate: 13.3 ng/mL (ref >3.0)
Vitamin B-12: 636 pg/mL (ref 232–1245)

## 2024-03-22 LAB — VITAMIN D 25 HYDROXY (VIT D DEFICIENCY, FRACTURES): Vit D, 25-Hydroxy: 43.9 ng/mL (ref 30.0–100.0)

## 2024-03-22 LAB — BMP8+EGFR
BUN/Creatinine Ratio: 23 (ref 12–28)
BUN: 31 mg/dL — ABNORMAL HIGH (ref 8–27)
CO2: 17 mmol/L — ABNORMAL LOW (ref 20–29)
Calcium: 10.4 mg/dL — ABNORMAL HIGH (ref 8.7–10.3)
Chloride: 103 mmol/L (ref 96–106)
Creatinine, Ser: 1.36 mg/dL — ABNORMAL HIGH (ref 0.57–1.00)
Glucose: 78 mg/dL (ref 70–99)
Potassium: 5.4 mmol/L — ABNORMAL HIGH (ref 3.5–5.2)
Sodium: 136 mmol/L (ref 134–144)
eGFR: 43 mL/min/1.73 — ABNORMAL LOW (ref >59)

## 2024-03-22 LAB — MAGNESIUM: Magnesium: 1.7 mg/dL (ref 1.6–2.3)

## 2024-03-22 NOTE — Progress Notes (Signed)
 Bonnie Torres,   Kidneys look dry. Drink more water  and recheck in 1 week.  Potassium is elevated. Cut potassium tablet in half and recheck in 1 week.  Magnesium  normal.  Vitamin d  normal.  B12 and folate look good.  Calcium  elevated but could be due to renal disease.   When do you see nephrology again?

## 2024-03-23 ENCOUNTER — Other Ambulatory Visit: Payer: Self-pay | Admitting: Physician Assistant

## 2024-03-23 DIAGNOSIS — N1831 Chronic kidney disease, stage 3a: Secondary | ICD-10-CM

## 2024-03-26 ENCOUNTER — Other Ambulatory Visit: Payer: Self-pay | Admitting: Physician Assistant

## 2024-03-26 DIAGNOSIS — I10 Essential (primary) hypertension: Secondary | ICD-10-CM

## 2024-03-26 DIAGNOSIS — E876 Hypokalemia: Secondary | ICD-10-CM

## 2024-04-03 ENCOUNTER — Telehealth: Payer: Self-pay | Admitting: Physician Assistant

## 2024-04-03 NOTE — Telephone Encounter (Unsigned)
 Copied from CRM [id number]. Topic: Clinical - Prescription Issue >> Apr 03, 2024  4:01 PM Donee H wrote: Reason for CRM: Ted from [facility] called to state patient's insurance no longer covers Onetouch diabetic supplies any longer. He stated if there can be an order for either Accu chek or contour.   Callback number [phone]

## 2024-04-03 NOTE — Telephone Encounter (Signed)
 Copied from CRM [id number]. Topic: Clinical - Prescription Issue >> Apr 03, 2024  4:01 PM Donee H wrote: Reason for CRM: Ted from [facility] called to state patient's insurance no longer covers Onetouch diabetic supplies any longer. He stated if there can be an order for either Accu chek or contour.   Callback number [phone]

## 2024-04-04 NOTE — Telephone Encounter (Signed)
 In checking patient chart - shows glucometer supplies just sent in on 03/21/2024 and 03/24/2024 to  [id number] - [place], [place] - [address] AT [facility] OF [address],  [place] [address] Phone: [phone]   Attempted call to patient to see which pharmacy she would like to use  As this request coming from [facility]  Left a voice mail message requesting a return call.

## 2024-04-05 NOTE — Telephone Encounter (Signed)
 Spoke with patient. She did not request that these be sent to [facility] - she has already picked up  a new meter and supplies at [employer].   Spoke with [employer] and cancelled One touch diabetic supply prescriptions  Patient will let  us  know if and when she needs refills of lancets  which pharmacy she would like ot use

## 2024-04-17 ENCOUNTER — Ambulatory Visit (INDEPENDENT_AMBULATORY_CARE_PROVIDER_SITE_OTHER): Admitting: Physician Assistant

## 2024-04-17 ENCOUNTER — Ambulatory Visit: Payer: Self-pay

## 2024-04-17 VITALS — BP 138/64 | HR 109 | Temp 98.3°F | Ht <= 58 in | Wt 148.0 lb

## 2024-04-17 DIAGNOSIS — J029 Acute pharyngitis, unspecified: Secondary | ICD-10-CM | POA: Insufficient documentation

## 2024-04-17 LAB — POCT RAPID STREP A (OFFICE): Rapid Strep A Screen: NEGATIVE

## 2024-04-17 NOTE — Progress Notes (Signed)
 Bonnie Torres                                          MRN: [id number]   04/17/2024   The [facility] Quality Team Specialist reviewed this patient medical record for the purposes of chart review for care gap closure. The following were reviewed: abstraction for care gap closure-kidney health evaluation for diabetes:eGFR  and uACR.    [facility] Quality Team

## 2024-04-17 NOTE — Telephone Encounter (Signed)
 FYI Only or Action Required?: FYI only for provider.  Patient was last seen in primary care on 03/21/2024 by Bonnie Vermell CROME, PA-C.  Called Nurse Triage reporting Lymphadenopathy.  Symptoms began several days ago.  Interventions attempted: Nothing.  Symptoms are: sore throat, pain when swallowing, left neck lymph node swelling about the size of a marble gradually worsening.  Triage Disposition: See Physician Within 24 Hours  Patient/caregiver understands and will follow disposition?: Yes           Copied from CRM #[id number]. Topic: Clinical - Red Word Triage >> Apr 17, 2024  8:58 AM Avram MATSU wrote: Red Word that prompted transfer to Nurse Triage: swollen gland, and has pain when swallowing Reason for Disposition  [1] Tender node in the neck AND [2] also has a sore throat AND [3] minimal/no runny nose or cough  Answer Assessment - Initial Assessment Questions 1. LOCATION: Where is the swollen node located? Is the matching node on the other side of the body also swollen?      Left side of neck. Unilateral, not on right side.  2. SIZE: How big is the node? (e.g., inches or centimeters; or compared to common objects such as pea, bean, marble, golf ball)      1 gland, about the size of a marble.  3. ONSET: When did the swelling start?      Friday.  4. NECK NODES: Is there a sore throat, runny nose or other symptoms of a cold?      She states during her last office visit about a month ago she was having allergy symptoms such as runny nose. She states she has been taking the Xyzal  that was prescribed.  5. GROIN OR ARMPIT NODES: Is there a sore, scratch, cut or painful red area on that arm or leg?      N/A.  6. FEVER: Do you have a fever? If Yes, ask: What is it, how was it measured, and when did it start?      No.  7. CAUSE: What do you think is causing the swollen lymph nodes?     Unsure.  8. OTHER SYMPTOMS: Do you have any other symptoms? (e.g., node  is tender to touch, skin redness over node, weight changes)     Node is tender to touch, sore throat, pain when swallowing. Denies redness, drooling, weight changes, difficulty breathing.  Protocols used: Lymph Nodes - Swollen-A-AH

## 2024-04-17 NOTE — Patient Instructions (Signed)

## 2024-04-17 NOTE — Progress Notes (Unsigned)
 Acute Office Visit  Subjective:     Patient ID: Bonnie Torres, female    DOB: Apr 08, 1956, 68 y.o.   MRN: [id number]  Chief Complaint  Patient presents with   Sore Throat    HPI .Discussed the use of AI scribe software for clinical note transcription with the patient, who gave verbal consent to proceed.  History of Present Illness Bonnie Torres is a 68 year old female who presents with a sore throat.  Pharyngitis symptoms - Sore throat since Friday - Soreness localized to the throat - Associated gland tenderness - Gargling with salt water  provides some relief - Expelled a piece of food during gargling, resulting in slight improvement of soreness  Otolaryngologic symptoms - No ear pain - Pain radiated to ear when eating the previous day - No cough - Runny nose, worsens at work  Symptom management - Using nasal spray, described as a pill, with inconsistent relief - Unable to take ibuprofen due to kidney issues - Using Tylenol  for symptom management    ROS See HPI.      Objective:    BP 138/64   Pulse (!) 109   Temp 98.3 F (36.8 C) (Oral)   Ht 4' 10 (1.473 m)   Wt 148 lb (67.1 kg)   SpO2 99%   BMI 30.93 kg/m  BP Readings from Last 3 Encounters:  04/17/24 138/64  03/21/24 (!) 104/56  02/08/24 107/61   Wt Readings from Last 3 Encounters:  04/17/24 148 lb (67.1 kg)  03/21/24 152 lb (68.9 kg)  02/08/24 156 lb 9.6 oz (71 kg)    .Bonnie Torres Results for orders placed or performed in visit on 04/17/24  POCT rapid strep A   Collection Time: 04/17/24  4:04 PM  Result Value Ref Range   Rapid Strep A Screen Negative Negative     Physical Exam Constitutional:      Appearance: She is well-developed.  HENT:     Head: Normocephalic.     Right Ear: Tympanic membrane and ear canal normal. No drainage, swelling or tenderness. No middle ear effusion. Tympanic membrane is not erythematous.     Left Ear: Tympanic membrane and ear canal normal. No drainage, swelling  or tenderness.  No middle ear effusion. Tympanic membrane is not erythematous.     Mouth/Throat:     Mouth: Mucous membranes are moist. No oral lesions.     Pharynx: Uvula midline. Posterior oropharyngeal erythema present. No pharyngeal swelling, oropharyngeal exudate or uvula swelling.     Tonsils: No tonsillar exudate or tonsillar abscesses. 0 on the right. 0 on the left.  Neck:     Comments: Shotty bilateral anterior cervical lymphadenopathy.  Cardiovascular:     Rate and Rhythm: Normal rate and regular rhythm.  Pulmonary:     Effort: Pulmonary effort is normal.     Breath sounds: Normal breath sounds.  Neurological:     General: No focal deficit present.     Mental Status: She is alert.  Psychiatric:        Mood and Affect: Mood normal.          Assessment & Plan:  SABRASABRAFaith was seen today for sore throat.  Diagnoses and all orders for this visit:  Acute pharyngitis, unspecified etiology -     POCT rapid strep A   Assessment & Plan Acute pharyngitis Negative for strep throat. Likely viral or irritation from food. Possible allergy-related nasal symptoms. - Recommend acetaminophen  for pain. - Advise salt water   and hot water  gargles. - Suggest throat spray for relief. - Recommend menthol cough drops or numbing lozenges. - Follow up if not improving or symptoms worsening    Vermell Bologna, PA-C

## 2024-04-18 ENCOUNTER — Encounter: Payer: Self-pay | Admitting: Physician Assistant

## 2024-04-19 ENCOUNTER — Ambulatory Visit

## 2024-04-19 DIAGNOSIS — Z79811 Long term (current) use of aromatase inhibitors: Secondary | ICD-10-CM | POA: Diagnosis not present

## 2024-04-19 DIAGNOSIS — Z170 Estrogen receptor positive status [ER+]: Secondary | ICD-10-CM

## 2024-04-19 DIAGNOSIS — C50311 Malignant neoplasm of lower-inner quadrant of right female breast: Secondary | ICD-10-CM | POA: Diagnosis not present

## 2024-04-19 DIAGNOSIS — Z780 Asymptomatic menopausal state: Secondary | ICD-10-CM | POA: Diagnosis not present

## 2024-04-20 ENCOUNTER — Ambulatory Visit: Payer: Self-pay

## 2024-04-23 ENCOUNTER — Other Ambulatory Visit: Payer: Self-pay | Admitting: Physician Assistant

## 2024-04-23 DIAGNOSIS — N1831 Chronic kidney disease, stage 3a: Secondary | ICD-10-CM

## 2024-04-23 DIAGNOSIS — F4321 Adjustment disorder with depressed mood: Secondary | ICD-10-CM

## 2024-04-23 DIAGNOSIS — Z636 Dependent relative needing care at home: Secondary | ICD-10-CM

## 2024-04-23 DIAGNOSIS — R4589 Other symptoms and signs involving emotional state: Secondary | ICD-10-CM

## 2024-05-02 LAB — OPHTHALMOLOGY REPORT-SCANNED

## 2024-06-20 ENCOUNTER — Ambulatory Visit: Admitting: Physician Assistant

## 2024-06-20 ENCOUNTER — Encounter: Payer: Self-pay | Admitting: Physician Assistant

## 2024-06-20 VITALS — BP 108/59 | HR 84 | Ht <= 58 in | Wt 147.0 lb

## 2024-06-20 DIAGNOSIS — E6609 Other obesity due to excess calories: Secondary | ICD-10-CM | POA: Diagnosis not present

## 2024-06-20 DIAGNOSIS — Z7984 Long term (current) use of oral hypoglycemic drugs: Secondary | ICD-10-CM

## 2024-06-20 DIAGNOSIS — E66811 Obesity, class 1: Secondary | ICD-10-CM | POA: Diagnosis not present

## 2024-06-20 DIAGNOSIS — Z6830 Body mass index (BMI) 30.0-30.9, adult: Secondary | ICD-10-CM

## 2024-06-20 DIAGNOSIS — R4589 Other symptoms and signs involving emotional state: Secondary | ICD-10-CM | POA: Diagnosis not present

## 2024-06-20 DIAGNOSIS — I10 Essential (primary) hypertension: Secondary | ICD-10-CM | POA: Diagnosis not present

## 2024-06-20 DIAGNOSIS — F5101 Primary insomnia: Secondary | ICD-10-CM | POA: Diagnosis not present

## 2024-06-20 DIAGNOSIS — N1831 Chronic kidney disease, stage 3a: Secondary | ICD-10-CM

## 2024-06-20 DIAGNOSIS — E1122 Type 2 diabetes mellitus with diabetic chronic kidney disease: Secondary | ICD-10-CM

## 2024-06-20 LAB — POCT GLYCOSYLATED HEMOGLOBIN (HGB A1C): Hemoglobin A1C: 5.7 % — AB (ref 4.0–5.6)

## 2024-06-20 MED ORDER — ACCU-CHEK SOFTCLIX LANCETS MISC: 4 refills | Status: AC

## 2024-06-20 NOTE — Progress Notes (Unsigned)
 "  Established Patient Office Visit  Subjective   Patient ID: Bonnie Torres, female    DOB: December 27, 1955  Age: 68 y.o. MRN: [id number]  Chief Complaint  Patient presents with   Medical Management of Chronic Issues    HPI .SABRADiscussed the use of AI scribe software for clinical note transcription with the patient, who gave verbal consent to proceed.  History of Present Illness Bonnie Torres is a 68 year old female with diabetes who presents for a three month diabetic follow-up.  Glycemic control - Diabetes managed with metformin  - Last hemoglobin A1c was 6.2% - Current hemoglobin A1c is 5.7% - Requires new AccuCheck lancets, specifically 'soft click' type due to previous issues with incorrect type  Nutritional status and appetite - Occasionally has to force herself to eat, though this is improving  Blood pressure and weight - Blood pressure is stable - Weight is stable with a slight decrease over time  Physical activity - Attempting to resume a routine of walking and exercising  Laboratory abnormalities - Previous laboratory results showed elevated calcium  and potassium levels, which have been persistent for years  Preventive care and screening - Completed eye exam at [facility] - Foot exam showed no concerns - Mammogram is scheduled - Received influenza vaccination in September  Sleep disturbance - Currently taking Ambien  for sleep  Falls - No falls    Patient Active Problem List   Diagnosis Date Noted   Acute pharyngitis 04/17/2024   Seasonal allergic rhinitis 03/21/2024   Muscle cramps 03/21/2024   Class 1 obesity due to excess calories with serious comorbidity and body mass index (BMI) of 30.0 to 30.9 in adult 03/21/2024   History of renal cell carcinoma 12/21/2023   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   Irritant contact dermatitis 12/21/2023   Genetic testing 08/17/2023   Malignant neoplasm of lower-inner quadrant of right  breast of female, estrogen receptor positive (HCC) 08/06/2023   Breast mass, right 07/16/2023   Chronic pain of left knee 03/23/2023   Knee mass, left 03/23/2023   Caregiver stress 10/28/2022   Depressed mood 10/28/2022   Unresolved grief 10/28/2022   Hyperlipidemia LDL goal <70 10/12/2022   Idiopathic chronic gout of right knee without tophus 12/16/2021   Bilateral primary osteoarthritis of knee 08/27/2021   DDD (degenerative disc disease), lumbar 11/12/2020   Stage 3b chronic kidney disease (HCC) 03/12/2020   Bilateral plantar fasciitis 01/29/2020   neoplasm left kidney 01/29/2020   Hx of partial nephrectomy 01/29/2020   Cubital tunnel syndrome, left 11/20/2019   Irregular heart rate 04/06/2018   Type 2 diabetes mellitus with stage 3a chronic kidney disease, without long-term current use of insulin  (HCC) 12/03/2017   Class 1 obesity due to excess calories without serious comorbidity with body mass index (BMI) of 33.0 to 33.9 in adult 12/03/2017   Trigger middle finger of left hand 12/01/2017   Hypokalemia 04/02/2017   Lymphangioma 06/18/2016   Uterine mass 04/27/2016   Mass of kidney 04/05/2016   Elevated testosterone  level in female 03/25/2016   Breast calcification, right 11/13/2015   Primary gout 10/16/2015   Insomnia 08/31/2014   Essential hypertension, benign 08/31/2014   Cyst of Bartholin's gland duct 04/03/2013   Routine health maintenance 03/03/2012   Pure hypercholesterolemia 04/28/2011   Past Medical History:  Diagnosis Date   Breast cancer (HCC)    Complication of anesthesia    Gout    1 yr ago right great toe   Heart rate fast  tx Metoprolol    History of radiation therapy    Right breast-10/13/23-11/11/23- Dr. 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   Type 2 diabetes mellitus without complication, without long-term  current use of insulin  (HCC) 12/03/2017   Past Surgical History:  Procedure Laterality Date   ABDOMINAL HYSTERECTOMY N/A 06/18/2016   Procedure: HYSTERECTOMY ABDOMINAL TOTAL;  Surgeon: Maurilio Ship, MD;  Location: [facility];  Service: Gynecology;  Laterality: N/A;   BREAST BIOPSY Right 08/02/2023   US  RT BREAST BX W LOC DEV 1ST LESION IMG BX SPEC US  GUIDE 08/02/2023 [facility]   BREAST BIOPSY  09/03/2023   MM RT RADIOACTIVE SEED EA ADD LESION LOC MAMMO GUIDE 09/03/2023 [facility]   BREAST BIOPSY  09/03/2023   MM RT RADIOACTIVE SEED LOC MAMMO GUIDE 09/03/2023 [facility]   BREAST EXCISIONAL BIOPSY Right    BREAST LUMPECTOMY WITH RADIOACTIVE SEED LOCALIZATION Right 09/06/2023   Procedure: RIGHT BREAST SEED BRACKETED LUMPECTOMY;  Surgeon: Ebbie Cough, MD;  Location: [facility];  Service: General;  Laterality: Right;   BREAST SURGERY Right    breast biopsy-benign   CHOLECYSTECTOMY     MASS EXCISION N/A 06/18/2016   Procedure: RESECTION RETROPERITONEAL MASS WITH RETORPERITONEAL EXPLORATION ,LIGATION CHYLI CISTERNA,OPEN  CHOLECYSTECTOMY WITH MOBILATION HEPATIC FLEXURE;  Surgeon: Zada JAYSON Hoard, MD;  Location: [facility];  Service: General;  Laterality: N/A;   PARTIAL NEPHRECTOMY Right 06/18/2016   Procedure: OPEN RIGHT NEPHRECTOMY PARTIAL;  Surgeon: Gretel Ferrara, MD;  Location: [facility];  Service: Urology;  Laterality: Right;   ROBOTIC ASSITED PARTIAL NEPHRECTOMY Left 11/30/2019   Procedure: ATTEMPTED XI ROBOTIC ASSITED LAPAROSCOPIC CONVERTED TO OPEN PARTIAL NEPHRECTOMY;  Surgeon: Ferrara Gretel, MD;  Location: [facility];  Service: Urology;  Laterality: Left;   SALPINGOOPHORECTOMY Bilateral 06/18/2016   Procedure: BILATERAL SALPINGO OOPHORECTOMY;  Surgeon: Maurilio Ship, MD;  Location: [facility];  Service: Gynecology;  Laterality: Bilateral;   SHOULDER SURGERY Right 2010   TOE SURGERY Right Hammer Toe   2004   TONSILLECTOMY     Social History[1] Family History   Problem Relation Age of Onset   Hypertension Mother    Ovarian cancer Sister    Diabetes Maternal Aunt    Stomach cancer Paternal Aunt    Stomach cancer Paternal Aunt        dx 54s   Ovarian cancer Cousin 83   Ovarian cancer Half-Sister 60       mat half sister   Breast cancer Niece    Allergies[2]    ROS    Objective:     BP (!) 108/59   Pulse 84   Ht 4' 10 (1.473 m)   Wt 147 lb (66.7 kg)   SpO2 99%   BMI 30.72 kg/m  BP Readings from Last 3 Encounters:  06/20/24 (!) 108/59  04/17/24 138/64  03/21/24 (!) 104/56   Wt Readings from Last 3 Encounters:  06/20/24 147 lb (66.7 kg)  04/17/24 148 lb (67.1 kg)  03/21/24 152 lb (68.9 kg)      Physical Exam   Results for orders placed or performed in visit on 06/20/24  POCT HgB A1C  Result Value Ref Range   Hemoglobin A1C 5.7 (A) 4.0 - 5.6 %   HbA1c POC (<> result, manual entry)     HbA1c, POC (prediabetic range)     HbA1c, POC (controlled diabetic range)  The 10-year ASCVD risk score (Arnett DK, et al., 2019) is: 12.1%    Assessment & Plan:  SABRASABRAFaith was seen today for medical management of chronic issues.  Diagnoses and all orders for this visit:  Depressed mood  Type 2 diabetes mellitus with stage 3a chronic kidney disease, without long-term current use of insulin  (HCC) -     Cancel: POCT HgB A1C -     POCT HgB A1C -     BMP8+eGFR  Essential hypertension, benign -     BMP8+eGFR  Primary insomnia  Class 1 obesity due to excess calories with serious comorbidity and body mass index (BMI) of 30.0 to 30.9 in adult  Other orders -     Accu-Chek Softclix Lancets lancets; Use as instructed -     Cancel: Flu vaccine HIGH DOSE PF(Fluzone Trivalent)   Assessment & Plan Type 2 diabetes mellitus with stage 3a chronic kidney disease A1c improved to 5.7, indicating good glycemic control. Kidney function and calcium  levels require monitoring. Nephrology consultation advised. - Continue  metformin . - Ordered kidney function and calcium  tests. - Referred to nephrology. - Ordered renal ultrasound.  Essential hypertension Blood pressure well-controlled at 108/59 mmHg. - Continue current antihypertensive regimen.  Primary insomnia Continues to use Ambien  for sleep management.  General Health Maintenance Received flu shot. Eye exam completed. Mammogram scheduled. Foot exam showed no concerns.     Return in about 3 months (around 09/18/2024).    Colbert Curenton, PA-C Will schedule appt coming up for labs as well and BP recheck.        [1] Social History Tobacco Use   Smoking status: Never   Smokeless tobacco: Never  Vaping Use   Vaping status: Never Used  Substance Use Topics   Alcohol use: No    Alcohol/week: 0.0 standard drinks of alcohol   Drug use: No  [2] No Known Allergies "

## 2024-06-21 LAB — BMP8+EGFR
BUN/Creatinine Ratio: 24 (ref 12–28)
BUN: 29 mg/dL — ABNORMAL HIGH (ref 8–27)
CO2: 19 mmol/L — ABNORMAL LOW (ref 20–29)
Calcium: 10.2 mg/dL (ref 8.7–10.3)
Chloride: 105 mmol/L (ref 96–106)
Creatinine, Ser: 1.21 mg/dL — ABNORMAL HIGH (ref 0.57–1.00)
Glucose: 85 mg/dL (ref 70–99)
Potassium: 5.8 mmol/L — ABNORMAL HIGH (ref 3.5–5.2)
Sodium: 140 mmol/L (ref 134–144)
eGFR: 49 mL/min/1.73 — ABNORMAL LOW

## 2024-06-26 ENCOUNTER — Ambulatory Visit: Payer: Self-pay | Admitting: Physician Assistant

## 2024-06-26 DIAGNOSIS — E875 Hyperkalemia: Secondary | ICD-10-CM

## 2024-06-26 NOTE — Progress Notes (Signed)
 Bonnie Torres,   Potassium too high. Can you cut your potassium tablets in half and just take one-half daily? Recheck potassium in 1 week.

## 2024-07-02 ENCOUNTER — Other Ambulatory Visit: Payer: Self-pay | Admitting: Hematology and Oncology

## 2024-07-14 ENCOUNTER — Ambulatory Visit
Admission: RE | Admit: 2024-07-14 | Discharge: 2024-07-14 | Disposition: A | Discharge status: Home / Self Care | Source: Ambulatory Visit | Attending: Adult Health | Admitting: Adult Health

## 2024-07-14 DIAGNOSIS — C50311 Malignant neoplasm of lower-inner quadrant of right female breast: Secondary | ICD-10-CM

## 2024-08-07 ENCOUNTER — Ambulatory Visit: Payer: 59

## 2024-08-10 ENCOUNTER — Inpatient Hospital Stay: Admitting: Hematology and Oncology

## 2024-08-10 ENCOUNTER — Encounter

## 2024-08-10 ENCOUNTER — Ambulatory Visit: Admitting: Hematology and Oncology

## 2024-09-19 ENCOUNTER — Ambulatory Visit: Admitting: Physician Assistant
